# Patient Record
Sex: Male | Born: 1950 | Race: White | Hispanic: No | Smoking: Never smoker
Health system: Southern US, Community
[De-identification: ages and names within clinical notes are randomized; demographics above are authoritative.]

## PROBLEM LIST (undated history)

## (undated) DIAGNOSIS — T8859XA Other complications of anesthesia, initial encounter: Secondary | ICD-10-CM

## (undated) DIAGNOSIS — G4733 Obstructive sleep apnea (adult) (pediatric): Secondary | ICD-10-CM

## (undated) DIAGNOSIS — I10 Essential (primary) hypertension: Secondary | ICD-10-CM

## (undated) DIAGNOSIS — F411 Generalized anxiety disorder: Secondary | ICD-10-CM

## (undated) DIAGNOSIS — Z8614 Personal history of Methicillin resistant Staphylococcus aureus infection: Secondary | ICD-10-CM

## (undated) DIAGNOSIS — J45909 Unspecified asthma, uncomplicated: Secondary | ICD-10-CM

## (undated) DIAGNOSIS — M1990 Unspecified osteoarthritis, unspecified site: Secondary | ICD-10-CM

## (undated) DIAGNOSIS — N529 Male erectile dysfunction, unspecified: Secondary | ICD-10-CM

## (undated) DIAGNOSIS — F341 Dysthymic disorder: Secondary | ICD-10-CM

## (undated) DIAGNOSIS — K602 Anal fissure, unspecified: Secondary | ICD-10-CM

## (undated) DIAGNOSIS — R112 Nausea with vomiting, unspecified: Secondary | ICD-10-CM

## (undated) DIAGNOSIS — E785 Hyperlipidemia, unspecified: Secondary | ICD-10-CM

## (undated) DIAGNOSIS — T4145XA Adverse effect of unspecified anesthetic, initial encounter: Secondary | ICD-10-CM

## (undated) DIAGNOSIS — I499 Cardiac arrhythmia, unspecified: Secondary | ICD-10-CM

## (undated) DIAGNOSIS — K219 Gastro-esophageal reflux disease without esophagitis: Secondary | ICD-10-CM

## (undated) DIAGNOSIS — Z9889 Other specified postprocedural states: Secondary | ICD-10-CM

## (undated) DIAGNOSIS — G8929 Other chronic pain: Secondary | ICD-10-CM

## (undated) DIAGNOSIS — IMO0002 Reserved for concepts with insufficient information to code with codable children: Secondary | ICD-10-CM

## (undated) DIAGNOSIS — E119 Type 2 diabetes mellitus without complications: Secondary | ICD-10-CM

## (undated) DIAGNOSIS — E349 Endocrine disorder, unspecified: Secondary | ICD-10-CM

## (undated) HISTORY — DX: Dysthymic disorder: F34.1

## (undated) HISTORY — DX: Obstructive sleep apnea (adult) (pediatric): G47.33

## (undated) HISTORY — DX: Gastro-esophageal reflux disease without esophagitis: K21.9

## (undated) HISTORY — DX: Reserved for concepts with insufficient information to code with codable children: IMO0002

## (undated) HISTORY — DX: Generalized anxiety disorder: F41.1

## (undated) HISTORY — DX: Essential (primary) hypertension: I10

## (undated) HISTORY — DX: Hyperlipidemia, unspecified: E78.5

## (undated) HISTORY — PX: HERNIA REPAIR: SHX51

## (undated) HISTORY — DX: Type 2 diabetes mellitus without complications: E11.9

## (undated) HISTORY — PX: SHOULDER ARTHROSCOPY: SHX128

## (undated) HISTORY — DX: Morbid (severe) obesity due to excess calories: E66.01

## (undated) HISTORY — DX: Anal fissure, unspecified: K60.2

## (undated) HISTORY — PX: SPHINCTEROTOMY: SHX5279

## (undated) HISTORY — DX: Unspecified osteoarthritis, unspecified site: M19.90

## (undated) HISTORY — DX: Unspecified asthma, uncomplicated: J45.909

## (undated) HISTORY — DX: Endocrine disorder, unspecified: E34.9

## (undated) HISTORY — DX: Male erectile dysfunction, unspecified: N52.9

## (undated) HISTORY — DX: Personal history of Methicillin resistant Staphylococcus aureus infection: Z86.14

## (undated) HISTORY — PX: OTHER SURGICAL HISTORY: SHX169

---

## 2000-08-28 ENCOUNTER — Observation Stay (HOSPITAL_COMMUNITY): Admission: RE | Admit: 2000-08-28 | Discharge: 2000-08-29 | Payer: Self-pay | Admitting: Orthopedic Surgery

## 2001-12-28 ENCOUNTER — Ambulatory Visit (HOSPITAL_COMMUNITY): Admission: RE | Admit: 2001-12-28 | Discharge: 2001-12-28 | Payer: Self-pay | Admitting: Gastroenterology

## 2003-05-01 ENCOUNTER — Emergency Department (HOSPITAL_COMMUNITY): Admission: EM | Admit: 2003-05-01 | Discharge: 2003-05-02 | Payer: Self-pay | Admitting: Emergency Medicine

## 2004-06-28 ENCOUNTER — Ambulatory Visit: Payer: Self-pay | Admitting: Pulmonary Disease

## 2004-06-28 ENCOUNTER — Ambulatory Visit (HOSPITAL_COMMUNITY): Admission: RE | Admit: 2004-06-28 | Discharge: 2004-06-28 | Payer: Self-pay | Admitting: Pulmonary Disease

## 2005-06-03 ENCOUNTER — Ambulatory Visit: Payer: Self-pay | Admitting: Pulmonary Disease

## 2005-12-19 ENCOUNTER — Ambulatory Visit: Payer: Self-pay | Admitting: Pulmonary Disease

## 2006-06-29 ENCOUNTER — Ambulatory Visit: Payer: Self-pay | Admitting: Pulmonary Disease

## 2006-06-29 LAB — CONVERTED CEMR LAB
ALT: 23 units/L (ref 0–40)
AST: 21 units/L (ref 0–37)
Albumin: 4.3 g/dL (ref 3.5–5.2)
Alkaline Phosphatase: 81 units/L (ref 39–117)
BUN: 16 mg/dL (ref 6–23)
Basophils Absolute: 0 10*3/uL (ref 0.0–0.1)
Basophils Relative: 0.1 % (ref 0.0–1.0)
CO2: 30 meq/L (ref 19–32)
Calcium: 9.9 mg/dL (ref 8.4–10.5)
Chloride: 104 meq/L (ref 96–112)
Cholesterol: 195 mg/dL (ref 0–200)
Creatinine, Ser: 1 mg/dL (ref 0.4–1.5)
Eosinophils Absolute: 0.4 10*3/uL (ref 0.0–0.6)
Eosinophils Relative: 3.3 % (ref 0.0–5.0)
GFR calc Af Amer: 100 mL/min
GFR calc non Af Amer: 83 mL/min
Glucose, Bld: 106 mg/dL — ABNORMAL HIGH (ref 70–99)
HCT: 44.5 % (ref 39.0–52.0)
HDL: 40.7 mg/dL (ref >39.0)
Hemoglobin: 15.2 g/dL (ref 13.0–17.0)
LDL Cholesterol: 131 mg/dL — ABNORMAL HIGH (ref 0–99)
Lymphocytes Relative: 20.2 % (ref 12.0–46.0)
MCHC: 34.1 g/dL (ref 30.0–36.0)
MCV: 85.3 fL (ref 78.0–100.0)
Monocytes Absolute: 0.5 10*3/uL (ref 0.2–0.7)
Monocytes Relative: 4.8 % (ref 3.0–11.0)
Neutro Abs: 7.6 10*3/uL (ref 1.4–7.7)
Neutrophils Relative %: 71.6 % (ref 43.0–77.0)
PSA: 0.58 ng/mL (ref 0.10–4.00)
Platelets: 355 10*3/uL (ref 150–400)
Potassium: 4.2 meq/L (ref 3.5–5.1)
RBC: 5.22 M/uL (ref 4.22–5.81)
RDW: 12 % (ref 11.5–14.6)
Sodium: 139 meq/L (ref 135–145)
TSH: 1.3 microintl units/mL (ref 0.35–5.50)
Total Bilirubin: 1.1 mg/dL (ref 0.3–1.2)
Total CHOL/HDL Ratio: 4.8
Total Protein: 7.7 g/dL (ref 6.0–8.3)
Triglycerides: 119 mg/dL (ref 0–149)
VLDL: 24 mg/dL (ref 0–40)
WBC: 10.7 10*3/uL — ABNORMAL HIGH (ref 4.5–10.5)

## 2006-07-22 ENCOUNTER — Emergency Department (HOSPITAL_COMMUNITY): Admission: EM | Admit: 2006-07-22 | Discharge: 2006-07-22 | Payer: Self-pay | Admitting: Emergency Medicine

## 2006-07-23 ENCOUNTER — Emergency Department (HOSPITAL_COMMUNITY): Admission: EM | Admit: 2006-07-23 | Discharge: 2006-07-23 | Payer: Self-pay | Admitting: Emergency Medicine

## 2006-07-24 ENCOUNTER — Encounter (HOSPITAL_COMMUNITY): Admission: RE | Admit: 2006-07-24 | Discharge: 2006-10-22 | Payer: Self-pay | Admitting: Otolaryngology

## 2006-07-30 ENCOUNTER — Emergency Department (HOSPITAL_COMMUNITY): Admission: EM | Admit: 2006-07-30 | Discharge: 2006-07-30 | Payer: Self-pay | Admitting: *Deleted

## 2006-08-30 ENCOUNTER — Encounter: Payer: Self-pay | Admitting: Cardiovascular Disease

## 2007-04-16 ENCOUNTER — Ambulatory Visit: Payer: Self-pay | Admitting: Pulmonary Disease

## 2007-04-16 LAB — CONVERTED CEMR LAB: Streptococcus, Group A Screen (Direct): NEGATIVE

## 2007-07-12 DIAGNOSIS — M1710 Unilateral primary osteoarthritis, unspecified knee: Secondary | ICD-10-CM

## 2007-07-12 DIAGNOSIS — K219 Gastro-esophageal reflux disease without esophagitis: Secondary | ICD-10-CM

## 2007-07-12 DIAGNOSIS — J45909 Unspecified asthma, uncomplicated: Secondary | ICD-10-CM | POA: Insufficient documentation

## 2007-07-13 ENCOUNTER — Encounter: Payer: Self-pay | Admitting: Pulmonary Disease

## 2007-07-13 ENCOUNTER — Ambulatory Visit: Payer: Self-pay | Admitting: Pulmonary Disease

## 2007-07-13 DIAGNOSIS — E66812 Obesity, class 2: Secondary | ICD-10-CM | POA: Insufficient documentation

## 2007-07-13 DIAGNOSIS — F341 Dysthymic disorder: Secondary | ICD-10-CM | POA: Insufficient documentation

## 2007-07-13 DIAGNOSIS — E785 Hyperlipidemia, unspecified: Secondary | ICD-10-CM | POA: Insufficient documentation

## 2007-07-13 DIAGNOSIS — F528 Other sexual dysfunction not due to a substance or known physiological condition: Secondary | ICD-10-CM | POA: Insufficient documentation

## 2007-07-13 DIAGNOSIS — I10 Essential (primary) hypertension: Secondary | ICD-10-CM | POA: Insufficient documentation

## 2007-07-13 DIAGNOSIS — M48061 Spinal stenosis, lumbar region without neurogenic claudication: Secondary | ICD-10-CM

## 2007-07-22 LAB — CONVERTED CEMR LAB
ALT: 28 units/L (ref 0–53)
AST: 25 units/L (ref 0–37)
Albumin: 4.1 g/dL (ref 3.5–5.2)
Alkaline Phosphatase: 76 units/L (ref 39–117)
BUN: 15 mg/dL (ref 6–23)
Basophils Absolute: 0 10*3/uL (ref 0.0–0.1)
Basophils Relative: 0.6 % (ref 0.0–1.0)
Bilirubin, Direct: 0.2 mg/dL (ref 0.0–0.3)
CO2: 29 meq/L (ref 19–32)
Calcium: 9.9 mg/dL (ref 8.4–10.5)
Chloride: 101 meq/L (ref 96–112)
Cholesterol: 214 mg/dL (ref 0–200)
Creatinine, Ser: 0.9 mg/dL (ref 0.4–1.5)
Direct LDL: 126.7 mg/dL
Eosinophils Absolute: 0.3 10*3/uL (ref 0.0–0.6)
Eosinophils Relative: 4.1 % (ref 0.0–5.0)
GFR calc Af Amer: 113 mL/min
GFR calc non Af Amer: 93 mL/min
Glucose, Bld: 107 mg/dL — ABNORMAL HIGH (ref 70–99)
HCT: 41.3 % (ref 39.0–52.0)
HDL: 39.3 mg/dL (ref >39.0)
Hemoglobin: 14.1 g/dL (ref 13.0–17.0)
Lymphocytes Relative: 25.4 % (ref 12.0–46.0)
MCHC: 34.2 g/dL (ref 30.0–36.0)
MCV: 84.8 fL (ref 78.0–100.0)
Monocytes Absolute: 0.6 10*3/uL (ref 0.2–0.7)
Monocytes Relative: 7.8 % (ref 3.0–11.0)
Neutro Abs: 5.2 10*3/uL (ref 1.4–7.7)
Neutrophils Relative %: 62.1 % (ref 43.0–77.0)
PSA: 0.84 ng/mL (ref 0.10–4.00)
Platelets: 312 10*3/uL (ref 150–400)
Potassium: 4.2 meq/L (ref 3.5–5.1)
RBC: 4.87 M/uL (ref 4.22–5.81)
RDW: 12.4 % (ref 11.5–14.6)
Sodium: 139 meq/L (ref 135–145)
TSH: 2.88 microintl units/mL (ref 0.35–5.50)
Testosterone: 329.04 ng/dL — ABNORMAL LOW (ref 350.00–890)
Total Bilirubin: 1 mg/dL (ref 0.3–1.2)
Total CHOL/HDL Ratio: 5.4
Total Protein: 7.6 g/dL (ref 6.0–8.3)
Triglycerides: 214 mg/dL (ref 0–149)
VLDL: 43 mg/dL — ABNORMAL HIGH (ref 0–40)
WBC: 8.2 10*3/uL (ref 4.5–10.5)

## 2007-09-19 ENCOUNTER — Telehealth: Payer: Self-pay | Admitting: Pulmonary Disease

## 2007-10-02 DIAGNOSIS — E349 Endocrine disorder, unspecified: Secondary | ICD-10-CM

## 2007-10-17 ENCOUNTER — Telehealth (INDEPENDENT_AMBULATORY_CARE_PROVIDER_SITE_OTHER): Payer: Self-pay | Admitting: *Deleted

## 2007-10-25 ENCOUNTER — Ambulatory Visit: Payer: Self-pay | Admitting: Pulmonary Disease

## 2007-10-26 ENCOUNTER — Ambulatory Visit: Payer: Self-pay | Admitting: Pulmonary Disease

## 2007-10-30 ENCOUNTER — Ambulatory Visit: Payer: Self-pay | Admitting: Pulmonary Disease

## 2007-10-30 LAB — CONVERTED CEMR LAB
ALT: 29 units/L (ref 0–53)
AST: 30 units/L (ref 0–37)
Albumin: 3.9 g/dL (ref 3.5–5.2)
Alkaline Phosphatase: 73 units/L (ref 39–117)
BUN: 16 mg/dL (ref 6–23)
Bilirubin, Direct: 0.1 mg/dL (ref 0.0–0.3)
CO2: 28 meq/L (ref 19–32)
Calcium: 9.3 mg/dL (ref 8.4–10.5)
Chloride: 107 meq/L (ref 96–112)
Cholesterol: 115 mg/dL (ref 0–200)
Creatinine, Ser: 1 mg/dL (ref 0.4–1.5)
GFR calc Af Amer: 99 mL/min
GFR calc non Af Amer: 82 mL/min
Glucose, Bld: 125 mg/dL — ABNORMAL HIGH (ref 70–99)
HDL: 29.7 mg/dL — ABNORMAL LOW (ref >39.0)
LDL Cholesterol: 66 mg/dL (ref 0–99)
PSA: 0.69 ng/mL (ref 0.10–4.00)
Potassium: 3.7 meq/L (ref 3.5–5.1)
Sodium: 136 meq/L (ref 135–145)
Testosterone: 190.75 ng/dL — ABNORMAL LOW (ref 350.00–890)
Total Bilirubin: 0.5 mg/dL (ref 0.3–1.2)
Total CHOL/HDL Ratio: 3.9
Total Protein: 6.9 g/dL (ref 6.0–8.3)
Triglycerides: 98 mg/dL (ref 0–149)
VLDL: 20 mg/dL (ref 0–40)

## 2007-10-31 ENCOUNTER — Telehealth: Payer: Self-pay | Admitting: Pulmonary Disease

## 2007-11-01 ENCOUNTER — Telehealth: Payer: Self-pay | Admitting: Pulmonary Disease

## 2007-11-21 ENCOUNTER — Telehealth: Payer: Self-pay | Admitting: Pulmonary Disease

## 2007-11-30 ENCOUNTER — Telehealth: Payer: Self-pay | Admitting: Pulmonary Disease

## 2007-12-20 ENCOUNTER — Encounter: Payer: Self-pay | Admitting: Pulmonary Disease

## 2007-12-21 ENCOUNTER — Encounter: Payer: Self-pay | Admitting: Pulmonary Disease

## 2007-12-31 ENCOUNTER — Telehealth (INDEPENDENT_AMBULATORY_CARE_PROVIDER_SITE_OTHER): Payer: Self-pay | Admitting: *Deleted

## 2008-01-03 ENCOUNTER — Telehealth (INDEPENDENT_AMBULATORY_CARE_PROVIDER_SITE_OTHER): Payer: Self-pay | Admitting: *Deleted

## 2008-01-08 ENCOUNTER — Telehealth: Payer: Self-pay | Admitting: Pulmonary Disease

## 2008-01-29 ENCOUNTER — Encounter: Payer: Self-pay | Admitting: Pulmonary Disease

## 2008-02-29 ENCOUNTER — Telehealth (INDEPENDENT_AMBULATORY_CARE_PROVIDER_SITE_OTHER): Payer: Self-pay | Admitting: *Deleted

## 2008-03-21 ENCOUNTER — Telehealth: Payer: Self-pay | Admitting: Pulmonary Disease

## 2008-03-27 ENCOUNTER — Encounter: Payer: Self-pay | Admitting: Pulmonary Disease

## 2008-05-14 ENCOUNTER — Ambulatory Visit: Payer: Self-pay | Admitting: Pulmonary Disease

## 2008-05-14 DIAGNOSIS — K602 Anal fissure, unspecified: Secondary | ICD-10-CM | POA: Insufficient documentation

## 2008-05-18 LAB — CONVERTED CEMR LAB
ALT: 27 units/L (ref 0–53)
AST: 26 units/L (ref 0–37)
Albumin: 4.2 g/dL (ref 3.5–5.2)
Alkaline Phosphatase: 82 units/L (ref 39–117)
BUN: 19 mg/dL (ref 6–23)
Basophils Absolute: 0 10*3/uL (ref 0.0–0.1)
Basophils Relative: 0.3 % (ref 0.0–3.0)
Bilirubin, Direct: 0.1 mg/dL (ref 0.0–0.3)
CO2: 30 meq/L (ref 19–32)
Calcium: 9.3 mg/dL (ref 8.4–10.5)
Chloride: 105 meq/L (ref 96–112)
Creatinine, Ser: 1 mg/dL (ref 0.4–1.5)
Eosinophils Absolute: 0.4 10*3/uL (ref 0.0–0.7)
Eosinophils Relative: 4.3 % (ref 0.0–5.0)
GFR calc Af Amer: 99 mL/min
GFR calc non Af Amer: 82 mL/min
Glucose, Bld: 94 mg/dL (ref 70–99)
HCT: 41.8 % (ref 39.0–52.0)
Hemoglobin: 14.5 g/dL (ref 13.0–17.0)
Lymphocytes Relative: 25 % (ref 12.0–46.0)
MCHC: 34.6 g/dL (ref 30.0–36.0)
MCV: 85.7 fL (ref 78.0–100.0)
Monocytes Absolute: 0.8 10*3/uL (ref 0.1–1.0)
Monocytes Relative: 8.4 % (ref 3.0–12.0)
Neutro Abs: 5.6 10*3/uL (ref 1.4–7.7)
Neutrophils Relative %: 62 % (ref 43.0–77.0)
Platelets: 279 10*3/uL (ref 150–400)
Potassium: 3.9 meq/L (ref 3.5–5.1)
RBC: 4.88 M/uL (ref 4.22–5.81)
RDW: 12.4 % (ref 11.5–14.6)
Sodium: 141 meq/L (ref 135–145)
TSH: 2.37 microintl units/mL (ref 0.35–5.50)
Total Bilirubin: 0.8 mg/dL (ref 0.3–1.2)
Total Protein: 7.6 g/dL (ref 6.0–8.3)
WBC: 9.1 10*3/uL (ref 4.5–10.5)

## 2008-05-26 ENCOUNTER — Telehealth: Payer: Self-pay | Admitting: Pulmonary Disease

## 2008-05-29 ENCOUNTER — Ambulatory Visit: Payer: Self-pay | Admitting: Pulmonary Disease

## 2008-06-25 ENCOUNTER — Encounter: Payer: Self-pay | Admitting: Pulmonary Disease

## 2008-07-15 ENCOUNTER — Ambulatory Visit: Payer: Self-pay | Admitting: Pulmonary Disease

## 2008-07-15 ENCOUNTER — Encounter: Payer: Self-pay | Admitting: Adult Health

## 2008-07-15 LAB — CONVERTED CEMR LAB: Anti Nuclear Antibody(ANA): NEGATIVE

## 2008-07-16 ENCOUNTER — Telehealth (INDEPENDENT_AMBULATORY_CARE_PROVIDER_SITE_OTHER): Payer: Self-pay | Admitting: *Deleted

## 2008-07-17 ENCOUNTER — Telehealth (INDEPENDENT_AMBULATORY_CARE_PROVIDER_SITE_OTHER): Payer: Self-pay | Admitting: *Deleted

## 2008-07-17 LAB — CONVERTED CEMR LAB
BUN: 23 mg/dL (ref 6–23)
Bacteria, UA: NEGATIVE
Basophils Absolute: 0 10*3/uL (ref 0.0–0.1)
Basophils Relative: 0.5 % (ref 0.0–3.0)
CO2: 27 meq/L (ref 19–32)
Calcium: 9.6 mg/dL (ref 8.4–10.5)
Chloride: 101 meq/L (ref 96–112)
Creatinine, Ser: 1.3 mg/dL (ref 0.4–1.5)
Crystals: NEGATIVE
Eosinophils Absolute: 0.3 10*3/uL (ref 0.0–0.7)
Eosinophils Relative: 2.6 % (ref 0.0–5.0)
GFR calc Af Amer: 73 mL/min
GFR calc non Af Amer: 61 mL/min
Glucose, Bld: 118 mg/dL — ABNORMAL HIGH (ref 70–99)
HCT: 43.7 % (ref 39.0–52.0)
Hemoglobin, Urine: NEGATIVE
Hemoglobin: 15.4 g/dL (ref 13.0–17.0)
Hgb A1c MFr Bld: 5.8 % (ref 4.6–6.0)
Leukocytes, UA: NEGATIVE
Lymphocytes Relative: 18.8 % (ref 12.0–46.0)
MCHC: 35.2 g/dL (ref 30.0–36.0)
MCV: 85.2 fL (ref 78.0–100.0)
Monocytes Absolute: 0.6 10*3/uL (ref 0.1–1.0)
Monocytes Relative: 6.7 % (ref 3.0–12.0)
Neutro Abs: 7 10*3/uL (ref 1.4–7.7)
Neutrophils Relative %: 71.4 % (ref 43.0–77.0)
Nitrite: NEGATIVE
Platelets: 319 10*3/uL (ref 150–400)
Potassium: 4.2 meq/L (ref 3.5–5.1)
RBC / HPF: NONE SEEN
RBC: 5.13 M/uL (ref 4.22–5.81)
RDW: 12.4 % (ref 11.5–14.6)
Sodium: 136 meq/L (ref 135–145)
Specific Gravity, Urine: >=1.03 (ref 1.000–1.03)
Squamous Epithelial / HPF: NEGATIVE /lpf
Total Protein, Urine: 30 mg/dL — AB
Urine Glucose: NEGATIVE mg/dL
Urobilinogen, UA: 0.2 (ref 0.0–1.0)
WBC: 9.7 10*3/uL (ref 4.5–10.5)
pH: 5 (ref 5.0–8.0)

## 2008-07-30 ENCOUNTER — Ambulatory Visit (HOSPITAL_BASED_OUTPATIENT_CLINIC_OR_DEPARTMENT_OTHER): Admission: RE | Admit: 2008-07-30 | Discharge: 2008-07-30 | Payer: Self-pay | Admitting: General Surgery

## 2008-08-19 ENCOUNTER — Telehealth (INDEPENDENT_AMBULATORY_CARE_PROVIDER_SITE_OTHER): Payer: Self-pay | Admitting: *Deleted

## 2008-09-10 ENCOUNTER — Telehealth: Payer: Self-pay | Admitting: Pulmonary Disease

## 2008-09-24 ENCOUNTER — Encounter: Payer: Self-pay | Admitting: Internal Medicine

## 2008-09-24 ENCOUNTER — Ambulatory Visit (HOSPITAL_BASED_OUTPATIENT_CLINIC_OR_DEPARTMENT_OTHER): Admission: RE | Admit: 2008-09-24 | Discharge: 2008-09-24 | Payer: Self-pay | Admitting: Cardiovascular Disease

## 2008-09-27 ENCOUNTER — Ambulatory Visit: Payer: Self-pay | Admitting: Internal Medicine

## 2008-10-06 ENCOUNTER — Ambulatory Visit: Payer: Self-pay | Admitting: Pulmonary Disease

## 2008-10-06 DIAGNOSIS — Z9989 Dependence on other enabling machines and devices: Secondary | ICD-10-CM

## 2008-10-06 DIAGNOSIS — G4733 Obstructive sleep apnea (adult) (pediatric): Secondary | ICD-10-CM | POA: Insufficient documentation

## 2008-10-07 LAB — CONVERTED CEMR LAB
ALT: 22 units/L (ref 0–53)
AST: 19 units/L (ref 0–37)
Albumin: 4.4 g/dL (ref 3.5–5.2)
Alkaline Phosphatase: 68 units/L (ref 39–117)
BUN: 23 mg/dL (ref 6–23)
Basophils Absolute: 0 10*3/uL (ref 0.0–0.1)
Basophils Relative: 0.3 % (ref 0.0–3.0)
Bilirubin, Direct: 0.1 mg/dL (ref 0.0–0.3)
CO2: 28 meq/L (ref 19–32)
Calcium: 9.5 mg/dL (ref 8.4–10.5)
Chloride: 104 meq/L (ref 96–112)
Cholesterol: 148 mg/dL (ref 0–200)
Creatinine, Ser: 1 mg/dL (ref 0.4–1.5)
Eosinophils Absolute: 0.3 10*3/uL (ref 0.0–0.7)
Eosinophils Relative: 2.8 % (ref 0.0–5.0)
GFR calc non Af Amer: 81.84 mL/min (ref >60)
Glucose, Bld: 94 mg/dL (ref 70–99)
HCT: 40.3 % (ref 39.0–52.0)
HDL: 47.5 mg/dL (ref >39.00)
Hemoglobin: 14 g/dL (ref 13.0–17.0)
LDL Cholesterol: 84 mg/dL (ref 0–99)
Lymphocytes Relative: 22.2 % (ref 12.0–46.0)
Lymphs Abs: 2.3 10*3/uL (ref 0.7–4.0)
MCHC: 34.7 g/dL (ref 30.0–36.0)
MCV: 86.7 fL (ref 78.0–100.0)
Monocytes Absolute: 0.7 10*3/uL (ref 0.1–1.0)
Monocytes Relative: 6.9 % (ref 3.0–12.0)
Neutro Abs: 6.9 10*3/uL (ref 1.4–7.7)
Neutrophils Relative %: 67.8 % (ref 43.0–77.0)
PSA: 0.85 ng/mL (ref 0.10–4.00)
Platelets: 255 10*3/uL (ref 150.0–400.0)
Potassium: 4 meq/L (ref 3.5–5.1)
RBC: 4.65 M/uL (ref 4.22–5.81)
RDW: 12.3 % (ref 11.5–14.6)
Sodium: 139 meq/L (ref 135–145)
TSH: 2.59 microintl units/mL (ref 0.35–5.50)
Total Bilirubin: 0.7 mg/dL (ref 0.3–1.2)
Total CHOL/HDL Ratio: 3
Total Protein: 7.6 g/dL (ref 6.0–8.3)
Triglycerides: 81 mg/dL (ref 0.0–149.0)
VLDL: 16.2 mg/dL (ref 0.0–40.0)
WBC: 10.2 10*3/uL (ref 4.5–10.5)

## 2008-10-13 ENCOUNTER — Telehealth (INDEPENDENT_AMBULATORY_CARE_PROVIDER_SITE_OTHER): Payer: Self-pay | Admitting: *Deleted

## 2008-12-23 ENCOUNTER — Telehealth: Payer: Self-pay | Admitting: Pulmonary Disease

## 2008-12-25 ENCOUNTER — Ambulatory Visit: Payer: Self-pay | Admitting: Pulmonary Disease

## 2008-12-30 ENCOUNTER — Telehealth: Payer: Self-pay | Admitting: Pulmonary Disease

## 2008-12-30 LAB — CONVERTED CEMR LAB

## 2009-02-06 ENCOUNTER — Encounter: Payer: Self-pay | Admitting: Pulmonary Disease

## 2009-02-17 ENCOUNTER — Telehealth: Payer: Self-pay | Admitting: Pulmonary Disease

## 2009-03-16 ENCOUNTER — Encounter: Payer: Self-pay | Admitting: Pulmonary Disease

## 2009-04-01 ENCOUNTER — Telehealth: Payer: Self-pay | Admitting: Pulmonary Disease

## 2009-04-06 ENCOUNTER — Ambulatory Visit: Payer: Self-pay | Admitting: Pulmonary Disease

## 2009-04-06 DIAGNOSIS — L989 Disorder of the skin and subcutaneous tissue, unspecified: Secondary | ICD-10-CM | POA: Insufficient documentation

## 2009-04-10 LAB — CONVERTED CEMR LAB
BUN: 18 mg/dL (ref 6–23)
CO2: 30 meq/L (ref 19–32)
Calcium: 8.9 mg/dL (ref 8.4–10.5)
Chloride: 107 meq/L (ref 96–112)
Cholesterol: 135 mg/dL (ref 0–200)
Creatinine, Ser: 1 mg/dL (ref 0.4–1.5)
GFR calc non Af Amer: 81.7 mL/min (ref >60)
Glucose, Bld: 108 mg/dL — ABNORMAL HIGH (ref 70–99)
HDL: 35.8 mg/dL — ABNORMAL LOW (ref >39.00)
LDL Cholesterol: 70 mg/dL (ref 0–99)
Potassium: 4.1 meq/L (ref 3.5–5.1)
Sodium: 143 meq/L (ref 135–145)
Total CHOL/HDL Ratio: 4
Triglycerides: 145 mg/dL (ref 0.0–149.0)
VLDL: 29 mg/dL (ref 0.0–40.0)

## 2009-04-13 ENCOUNTER — Encounter: Payer: Self-pay | Admitting: Pulmonary Disease

## 2009-04-14 ENCOUNTER — Telehealth: Payer: Self-pay | Admitting: Pulmonary Disease

## 2009-05-18 ENCOUNTER — Telehealth: Payer: Self-pay | Admitting: Pulmonary Disease

## 2009-06-09 ENCOUNTER — Telehealth: Payer: Self-pay | Admitting: Pulmonary Disease

## 2009-07-13 ENCOUNTER — Encounter: Payer: Self-pay | Admitting: Pulmonary Disease

## 2009-07-21 ENCOUNTER — Telehealth: Payer: Self-pay | Admitting: Pulmonary Disease

## 2009-08-17 ENCOUNTER — Encounter: Payer: Self-pay | Admitting: Cardiovascular Disease

## 2009-09-07 ENCOUNTER — Ambulatory Visit: Payer: Self-pay | Admitting: Cardiovascular Disease

## 2009-09-07 ENCOUNTER — Encounter: Payer: Self-pay | Admitting: Pulmonary Disease

## 2009-10-05 ENCOUNTER — Ambulatory Visit: Payer: Self-pay | Admitting: Pulmonary Disease

## 2009-10-06 LAB — CONVERTED CEMR LAB
ALT: 27 units/L (ref 0–53)
AST: 27 units/L (ref 0–37)
Albumin: 4.3 g/dL (ref 3.5–5.2)
Alkaline Phosphatase: 61 units/L (ref 39–117)
BUN: 19 mg/dL (ref 6–23)
Basophils Absolute: 0 10*3/uL (ref 0.0–0.1)
Basophils Relative: 0.3 % (ref 0.0–3.0)
Bilirubin, Direct: 0.1 mg/dL (ref 0.0–0.3)
CO2: 32 meq/L (ref 19–32)
Calcium: 9.4 mg/dL (ref 8.4–10.5)
Chloride: 103 meq/L (ref 96–112)
Cholesterol: 145 mg/dL (ref 0–200)
Creatinine, Ser: 1 mg/dL (ref 0.4–1.5)
Eosinophils Absolute: 0.3 10*3/uL (ref 0.0–0.7)
Eosinophils Relative: 4.3 % (ref 0.0–5.0)
GFR calc non Af Amer: 81.56 mL/min (ref >60)
Glucose, Bld: 103 mg/dL — ABNORMAL HIGH (ref 70–99)
HCT: 40.3 % (ref 39.0–52.0)
HDL: 42.6 mg/dL (ref >39.00)
Hemoglobin: 14.1 g/dL (ref 13.0–17.0)
LDL Cholesterol: 77 mg/dL (ref 0–99)
Lymphocytes Relative: 23.8 % (ref 12.0–46.0)
Lymphs Abs: 1.9 10*3/uL (ref 0.7–4.0)
MCHC: 35 g/dL (ref 30.0–36.0)
MCV: 84.3 fL (ref 78.0–100.0)
Monocytes Absolute: 0.6 10*3/uL (ref 0.1–1.0)
Monocytes Relative: 7.2 % (ref 3.0–12.0)
Neutro Abs: 5.2 10*3/uL (ref 1.4–7.7)
Neutrophils Relative %: 64.4 % (ref 43.0–77.0)
PSA: 0.65 ng/mL (ref 0.10–4.00)
Platelets: 271 10*3/uL (ref 150.0–400.0)
Potassium: 4.7 meq/L (ref 3.5–5.1)
RBC: 4.78 M/uL (ref 4.22–5.81)
RDW: 13.5 % (ref 11.5–14.6)
Sodium: 141 meq/L (ref 135–145)
TSH: 1.9 microintl units/mL (ref 0.35–5.50)
Total Bilirubin: 0.5 mg/dL (ref 0.3–1.2)
Total CHOL/HDL Ratio: 3
Total Protein: 7.3 g/dL (ref 6.0–8.3)
Triglycerides: 128 mg/dL (ref 0.0–149.0)
VLDL: 25.6 mg/dL (ref 0.0–40.0)
WBC: 8 10*3/uL (ref 4.5–10.5)

## 2009-11-04 ENCOUNTER — Encounter: Payer: Self-pay | Admitting: Pulmonary Disease

## 2009-11-23 ENCOUNTER — Ambulatory Visit: Payer: Self-pay | Admitting: Pulmonary Disease

## 2010-01-04 ENCOUNTER — Encounter: Payer: Self-pay | Admitting: Pulmonary Disease

## 2010-04-05 ENCOUNTER — Ambulatory Visit: Payer: Self-pay | Admitting: Cardiovascular Disease

## 2010-04-05 ENCOUNTER — Ambulatory Visit: Payer: Self-pay | Admitting: Pulmonary Disease

## 2010-04-05 LAB — CONVERTED CEMR LAB
BUN: 15 mg/dL
CO2: 29 meq/L
Calcium: 8.8 mg/dL
Chloride: 98 meq/L
Cholesterol: 152 mg/dL
Creatinine, Ser: 0.9 mg/dL
GFR calc non Af Amer: 88.53 mL/min
Glucose, Bld: 86 mg/dL
HDL: 40.5 mg/dL
LDL Cholesterol: 72 mg/dL
Potassium: 4.1 meq/L
Sodium: 141 meq/L
Total CHOL/HDL Ratio: 4
Triglycerides: 198 mg/dL — ABNORMAL HIGH
VLDL: 39.6 mg/dL

## 2010-07-06 NOTE — Letter (Signed)
[facility]   Imported By: Sherian Rein 11/23/2009 11:48:01  _____________________________________________________________________  External Attachment:    Type:   Image     Comment:   External Document

## 2010-07-06 NOTE — Letter (Signed)
Summary: [facility]   Imported By: Sherian Rein 09/15/2009 07:33:09  _____________________________________________________________________  External Attachment:    Type:   Image     Comment:   External Document

## 2010-07-06 NOTE — Letter (Signed)
Summary: [facility]   Imported By: Sherian Rein 01/11/2010 14:36:48  _____________________________________________________________________  External Attachment:    Type:   Image     Comment:   External Document

## 2010-07-06 NOTE — Assessment & Plan Note (Signed)
Summary: F6M/AMD  Medications Added * FISH OIL 1400 MG take one capsule by mouth two times a day      Allergies Added: NKDA  Visit Type:  Follow-up Referring Provider:  Mariah Milling Primary Provider:  Dr. Michele Mcalpine  CC:  Denies chest pain or shortness of breath..  History of Present Illness: Dakota Ellison is a pleasant 60 year old gentleman with a history of obesity, obstructive sleep apnea, hypertension, hyperlipidemia who presents 4 routine followup.  He continues to struggle with his weight. His weight is up more than 11 pounds since his last office visit. He has an addiction to junk food he reports. He stopped doing any significant exercise. He knows that he needs to lose weight but he has not been particularly motivated.  He denies any other symptoms of shortness of breath or chest discomfort.  He is able to tolerate his CPAP.  cholesterol in November 2010 is 135, LDL 70, HDL 36.  Last echocardiogram in March 2008 showing normal systolic function, right ventricle mildly dilated, mild mitral regurg, mild tricuspid regurg.  Last stress test in March 2008 where he achieved 10 mets, exercised for 9 minutes, no ischemia noted. Overall a normal,  low-risk scan.  EKG shows normal sinus rhythm with rate 80 beats per minute, no significant ST-T wave changes  Current Medications (verified): 1)  Aspirin 81 Mg Tbec (Aspirin) .... Take One Tablet By Mouth Daily 2)  Exforge Hct 10-320-25 Mg Tabs (Amlodipine-Valsartan-Hctz) .... Take 1 Tablet By Mouth Once A Day 3)  Simvastatin 40 Mg  Tabs (Simvastatin) .... Take One Tablet By Mouth At Bedtime 4)  Niacin Cr 500 Mg Cr-Tabs (Niacin) .... Take 1 Tab By Mouth Daily.Marland KitchenMarland Kitchen 5)  Fish Oil 1400 Mg .... Take One Capsule By Mouth Two Times A Day 6)  Omeprazole 20 Mg  Cpdr (Omeprazole) .... Take 1 Tablet By Mouth Once A Day 7)  Proctosol Hc 2.5 %  Crea (Hydrocortisone) .... Apply As Directed After Each Bm... 8)  Viagra 100 Mg  Tabs (Sildenafil Citrate) ....  Use As Directed... 9)  Vicodin 5-500 Mg  Tabs (Hydrocodone-Acetaminophen) .... Take One Tablet By Mouth Every 6-8 Hours As Needed   Not To Exceed 3 Per Day 10)  Zoloft 25 Mg Tabs (Sertraline Hcl) .Marland Kitchen.. 1 Once Daily 11)  Alprazolam 0.5 Mg  Tabs (Alprazolam) .Marland Kitchen.. 1 Tab By Mouth Three Times A Day As Needed For Nerves... 12)  Multivitamins   Tabs (Multiple Vitamin) .... Take 1 Tablet By Mouth Once A Day 13)  Chromium Picolinate 200 Mcg Tabs (Chromium Picolinate) .Marland Kitchen.. 1 Tab Daily 14)  Cinnamon 500 Mg Caps (Cinnamon) .Marland Kitchen.. 1 Tab Daily 15)  Potassium Gluconate 550 Mg Tabs (Potassium Gluconate) .Marland Kitchen.. 1 By Mouth Once Daily 16)  Apple Cider Vinegar 500 Mg Tabs (Apple Cider Vinegar) .... 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 Arthroscopic subacromial decompression. Open resection, distal right clavicle  Family History: Last updated: 04-06-10 mother died---pt not sure of age or reason father died at age 13 from  internal complications 1 sibling 1 brother died at age 26 from complications of pneumonia  Social History: Last updated: 04/05/2010 pt is divorced has 1 child pt is a long distance driver smokes an occ. cigar social alcohol  Risk Factors: Alcohol Use: 0  (04/05/2010) Caffeine Use: 2 (04/05/2010) Exercise: yes (04/05/2010)  Risk Factors: Smoking Status: current (04/05/2010) Cigars/wk: 2-3 daily (04/05/2010)  Review of Systems       The patient complains of weight gain.  The patient denies fever, weight loss, vision loss, decreased hearing, hoarseness, chest pain, syncope, dyspnea on exertion, peripheral edema, prolonged cough, abdominal pain, incontinence, muscle weakness, depression, and enlarged lymph nodes.    Vital Signs:  Patient profile:   60 year old male Height:      72 inches Weight:      311 pounds BMI:     42.33 Pulse rate:   80 / minute BP sitting:   112 / 80  (left arm) Cuff size:   large  Vitals Entered By: Bishop Dublin, CMA (April 05, 2010 3:59 PM)  Physical Exam  General:  morbidly obese gentleman in no apparent distress, HEENT exam is benign, oropharynx is clear, neck is supple with no JVP or carotid bruits, heart sounds are regular with S1-S2 and no murmurs appreciated, lungs are clear to auscultation with no wheezes or rales, no significant lower extremity edema, neurologic exam is nonfocal, skin is warm and dry. Pulses are equal and symmetrical in his upper and lower extremities.   Impression & Recommendations:  Problem # 1:  HYPERTENSION (ICD-401.9) blood pressure is well controlled on his current medication regimen. No changes made.  His updated medication list for this problem includes:    Aspirin 81 Mg Tbec (Aspirin) .Marland Kitchen... Take one tablet by mouth daily    Exforge Hct 10-320-25 Mg Tabs (Amlodipine-valsartan-hctz) .Marland Kitchen... Take 1 tablet by mouth once a day  Problem # 2:  HYPERLIPIDEMIA (ICD-272.4) Cholesterol is well controlled on his current medication regimen.Marland Kitchen  His updated medication list for this problem includes:    Simvastatin 40 Mg Tabs (Simvastatin) .Marland Kitchen... Take one tablet by mouth at bedtime    Niacin Cr 500 Mg Cr-tabs (Niacin) .Marland Kitchen... Take 1 tab by mouth daily...  Problem # 3:  OBSTRUCTIVE  SLEEP APNEA (ICD-327.23) He is tolerating his CPAP well with no complications.  Problem # 4:  MORBID OBESITY (ICD-278.01) Obesity is his biggest problem. He is determined to try to lose some weight and we have given him a dietary guide in the office today.  Other Orders: EKG w/ Interpretation (93000)  Patient Instructions: 1)  Your physician recommends that you continue on your current medications as directed. Please refer to the Current Medication list given to you today. 2)  Your physician wants you to follow-up in:   1 year You will receive a reminder letter in the mail two months in advance. If you don't receive a letter, please call our office to schedule the follow-up appointment.

## 2010-07-06 NOTE — Assessment & Plan Note (Signed)
Summary: NP follow up    Copy to:  Gollan Primary Provider/Referring Provider:  Dr. Michele Mcalpine  CC:  red spot on right chest area x2weeks and denies pain/itching.  History of Present Illness: 60 y/o  with known history of multiple medical problems, with HTN, DJD, hyperlipidemia.     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      ** Jan10- saw DrDahlstadt for f/u hypogonadism on Testim gel 5gm/d- he did Testosterone level and PSA but we don't have these results...      ** Feb10- had right knee arthroscopy by DrMurphy... also required more recent tap & shot in knee.      ** Feb10- saw NP w/ weakness, low energy, low interest, etc... tried Zoloft 25mg /d & seems to help.      ** Feb10- had anal fissure surg by DrWeatherly...      ** Apr10- had Sleep Study ordered by DrGollan> pos for OSA w/ AHI= 82, desat to 85%... split night study w/ good control on CPAP 20 (see report)... pt is [facility] hear [facility] about [facility].   ~  April 06, 2009:  his weight is 291# down only 3# in 6 mo- ?on diet "I watch what I eat" and exercise= walking 2 mi/d... started on CPAP w/ ramp to 20cm per [facility]... due for f/u FLP.   ~  Oct 05, 2009:  his head is now shaved, works for [employer], "leaves nothing to grab"...  He saw DrGollan at [facility] for f/u HBP, Hyperlipid, Obesity & OSA- BP controlled on med, due for f/u FLP on his 3 meds, still sees [facility] who wrote for his CPAP... 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 I refused this & explained why> rec diet + exercise... Saw DrDahlstadt 4/11 for his Hypogonadism- he had Testopel Implants but 1 pellet site got infected & 2 pellets popped out... Rx by Urology, they plan f/u Testosterone level in June... 11/23/09--Presents for  work in visit. Noticed a red spot on right  chest area x2weeks. Noticed that he a dark red spot along right lower chest wall, it has lightened over last week and is only faintly seen. Does not remember insect bite, no knnwn tick bite/exposure. Area does not itch, no pain, abd pain, fever, new meds or skin products. No previous episodes.   Medications Prior to Update: 1)  Aspirin 81 Mg Tbec (Aspirin) .... Take One Tablet By Mouth Daily 2)  Exforge Hct 10-320-25 Mg Tabs (Amlodipine-Valsartan-Hctz) .... Take 1 Tablet By Mouth Once A Day 3)  Simvastatin 40 Mg  Tabs (Simvastatin) .... Take One Tablet By Mouth At Bedtime 4)  Niacin Cr 500 Mg Cr-Tabs (Niacin) .... Take 1 Tab By Mouth Daily.Marland KitchenMarland Kitchen 5)  Fish Oil 1000 Mg  Caps (Omega-3 Fatty Acids) .... Take One Capsule By Mouth Two Times A Day 6)  Omeprazole 20 Mg  Cpdr (Omeprazole) .... Take 1 Tablet By Mouth Once A Day 7)  Proctosol Hc 2.5 %  Crea (Hydrocortisone) .... Apply As Directed After Each Bm... 8)  Viagra 100 Mg  Tabs (Sildenafil Citrate) .... Use As Directed... 9)  Vicodin 5-500 Mg  Tabs (Hydrocodone-Acetaminophen) .... Take One Tablet By Mouth  Every 6-8 Hours As Needed   Not To Exceed 3 Per Day 10)  Zoloft 25 Mg Tabs (Sertraline Hcl) .Marland Kitchen.. 1 Once Daily 11)  Alprazolam 0.5 Mg  Tabs (Alprazolam) .Marland Kitchen.. 1 Tab By Mouth Three Times A Day As Needed For Nerves... 12)  Multivitamins   Tabs (Multiple Vitamin) .... Take 1 Tablet By Mouth Once A Day 13)  Chromium Picolinate 200 Mcg Tabs (Chromium Picolinate) .Marland Kitchen.. 1 Tab Daily 14)  Cinnamon 500 Mg Caps (Cinnamon) .Marland Kitchen.. 1 Tab Daily 15)  Potassium Gluconate 550 Mg Tabs (Potassium Gluconate) .Marland Kitchen.. 1 By Mouth Once Daily 16)  Apple Cider Vinegar 500 Mg Tabs (Apple Cider Vinegar) .... 4 Tsp 17)  Olive Oil  Oil (Olive Oil) .... 2 Tsp Daily  Current Medications (verified): 1)  Aspirin 81 Mg Tbec (Aspirin) .... Take One Tablet By Mouth Daily 2)  Exforge Hct 10-320-25 Mg Tabs (Amlodipine-Valsartan-Hctz) .... Take 1 Tablet  By Mouth Once A Day 3)  Simvastatin 40 Mg  Tabs (Simvastatin) .... Take One Tablet By Mouth At Bedtime 4)  Niacin Cr 500 Mg Cr-Tabs (Niacin) .... Take 1 Tab By Mouth Daily.Marland KitchenMarland Kitchen 5)  Fish Oil 1000 Mg  Caps (Omega-3 Fatty Acids) .... Take One Capsule By Mouth Two Times A Day 6)  Omeprazole 20 Mg  Cpdr (Omeprazole) .... Take 1 Tablet By Mouth Once A Day 7)  Proctosol Hc 2.5 %  Crea (Hydrocortisone) .... Apply As Directed After Each Bm... 8)  Viagra 100 Mg  Tabs (Sildenafil Citrate) .... Use As Directed... 9)  Vicodin 5-500 Mg  Tabs (Hydrocodone-Acetaminophen) .... Take One Tablet By Mouth Every 6-8 Hours As Needed   Not To Exceed 3 Per Day 10)  Zoloft 25 Mg Tabs (Sertraline Hcl) .Marland Kitchen.. 1 Once Daily 11)  Alprazolam 0.5 Mg  Tabs (Alprazolam) .Marland Kitchen.. 1 Tab By Mouth Three Times A Day As Needed For Nerves... 12)  Multivitamins   Tabs (Multiple Vitamin) .... Take 1 Tablet By Mouth Once A Day 13)  Chromium Picolinate 200 Mcg Tabs (Chromium Picolinate) .Marland Kitchen.. 1 Tab Daily 14)  Cinnamon 500 Mg Caps (Cinnamon) .Marland Kitchen.. 1 Tab Daily 15)  Potassium Gluconate 550 Mg Tabs (Potassium Gluconate) .Marland Kitchen.. 1 By Mouth Once Daily 16)  Apple Cider Vinegar 500 Mg Tabs (Apple Cider Vinegar) .... 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 Arthroscopic subacromial decompression. Open resection, distal right clavicle.  Family History: Last updated: 11-11-07 mother died---pt not sure of age or reason father died at age 26 from internal complications 1 sibling 1 brother died at age 62 from complications of pneumonia  Social History: Last updated: 11-Nov-2007 pt is divorced has 1 child pt is a long distance driver smokes an occ. cigar social etoh  Risk Factors: Alcohol Use: 0 (09/07/2009) Caffeine Use: 2 (09/07/2009) Exercise: yes (09/07/2009)  Risk Factors: Smoking Status: never (09/07/2009) Cigars/wk: 2-3 daily (09/07/2009)  Review of Systems      See HPI  Vital Signs:  Patient profile:   60 year old male Height:  72 inches Weight:      302 pounds BMI:     41.11 O2 Sat:      96 % on Room air Temp:     99.0 degrees F oral Pulse rate:   98 / minute BP sitting:   136 / 74  (left arm) Cuff size:   large  Vitals Entered By: Boone Master CNA/MA (November 23, 2009 10:47 AM)  O2 Flow:  Room air CC: red spot on right chest area x2weeks, denies pain/itching Is Patient Diabetic? No Comments Medications reviewed with patient Daytime contact number verified with patient. Boone Master CNA/MA  November 23, 2009 10:46 AM    Physical Exam  Additional Exam:  WD, Obese, 60 y/o WM in NAD... GENERAL:  Alert & oriented; pleasant & cooperative. HEENT:  [place]/AT, EOM-wnl, PERRLA, EACs-clear, TMs-wnl, NOSE-clear, THROAT-clear & wnl. NECK:  Supple w/ fairROM; no JVD; normal carotid impulses w/o bruits; no thyromegaly or nodules palpated; no lymphadenopathy. CHEST:  Clear to P & A; without wheezes/ rales/ or rhonchi. HEART:  Regular Rhythm; gr 1/6 SEM, without rubs or gallops... ABDOMEN:  Obese soft & nontender; normal bowel sounds; no organomegaly or masses detected.  EXT: without deformities, mild arthritic changes; no varicose veins/ +venous insuffic  NEURO:  no focal neuro deficits... DERM:  Right  circular flesh colored patch  ~0.5cm ,  upper abd/rib area no streaking, flat appearance, no other abnormal lesions noted.     Impression & Recommendations:  Problem # 1:  SKIN LESION (ICD-709.9)  ? etiology, appears to be resolving. possible dermatitis.  advised to keep watch on, it reoccurs can send to dermatiology to evaluate.  REC:  Apply hydrocortisone  cream to area two times a day for 7 days If this does not resolve, we can refer to dermatology Please contact office for sooner follow up if symptoms do not improve or worsen   Orders: Est. Patient Level II (08657)  Complete Medication List: 1)  Aspirin 81 Mg Tbec (Aspirin) .... Take one tablet by mouth daily 2)  Exforge Hct 10-320-25 Mg Tabs (Amlodipine-valsartan-hctz) .... Take 1 tablet by mouth once a day 3)  Simvastatin 40 Mg Tabs (Simvastatin) .... Take one tablet by mouth at bedtime 4)  Niacin Cr 500 Mg Cr-tabs (Niacin) .... Take 1 tab by mouth daily.Marland KitchenMarland Kitchen 5)  Fish Oil 1000 Mg Caps (Omega-3 fatty acids) .... Take one capsule by mouth two times a day 6)  Omeprazole 20 Mg Cpdr (Omeprazole) .... Take 1 tablet by mouth once a day 7)  Proctosol Hc 2.5 % Crea (Hydrocortisone) .... Apply as directed after each bm... 8)  Viagra 100 Mg Tabs (Sildenafil citrate) .... Use as directed... 9)  Vicodin 5-500 Mg Tabs (Hydrocodone-acetaminophen) .... Take one tablet by mouth every 6-8 hours as needed   not to exceed 3 per day 10)  Zoloft 25 Mg Tabs (Sertraline hcl) .Marland Kitchen.. 1 once daily 11)  Alprazolam 0.5 Mg Tabs (Alprazolam) .Marland Kitchen.. 1 tab by mouth three times a day as needed for nerves... 12)  Multivitamins Tabs (Multiple vitamin) .... Take 1 tablet by mouth once a day 13)  Chromium Picolinate 200 Mcg Tabs (Chromium picolinate) .Marland Kitchen.. 1 tab daily 14)  Cinnamon 500 Mg Caps (Cinnamon) .Marland Kitchen.. 1 tab daily 15)  Potassium Gluconate 550 Mg Tabs (Potassium gluconate) .Marland Kitchen.. 1 by mouth once daily 16)  Apple Cider Vinegar 500 Mg Tabs (Apple cider vinegar) .... 4  tsp  Patient Instructions: 1)  Apply hydrocortisone  cream to area two times a  day for 7 days 2)  If this does not resolve, we can refer to dermatology 3)  Please contact office for sooner follow up if symptoms do not improve or worsen    Immunization History:  Influenza Immunization History:    Influenza:  historical (03/06/2009)

## 2010-07-06 NOTE — Progress Notes (Signed)
Summary: prescript  Phone Note Call from Patient   Caller: Patient Call For: nadel Summary of Call: need exforge hct 10-320/25mg  omeprazole20mg  simvastatin40mg   cv [phone] Initial call taken by: Rickard Patience,  July 21, 2009 10:39 AM  Follow-up for Phone Call        6 refills on each sent electronically yesterday.  called [facility] and they stated they do not have have them.  gave verbal for the 6 refills on each of the omeprazole, simvastatin and exforge. Boone Master CNA  July 21, 2009 10:48 AM   pt advised rx sent. Carron Curie CMA  July 21, 2009 10:56 AM

## 2010-07-06 NOTE — Letter (Signed)
Summary: [facility]   Imported By: Sherian Rein 07/17/2009 12:33:52  _____________________________________________________________________  External Attachment:    Type:   Image     Comment:   External Document

## 2010-07-06 NOTE — Assessment & Plan Note (Signed)
Summary: np6  Medications Added POTASSIUM GLUCONATE 550 MG TABS (POTASSIUM GLUCONATE) 1 by mouth once daily PHENTERMINE HCL 37.5 MG CAPS (PHENTERMINE HCL) 1 by mouth once daily APPLE CIDER VINEGAR 500 MG TABS (APPLE CIDER VINEGAR) 4 tsp OLIVE OIL  OIL (OLIVE OIL) 2 tsp daily      Allergies Added: NKDA  Visit Type:  NP Referring Provider:  IDPOEU Primary Provider:  Dr. Michele Mcalpine  CC:  no complaints.  History of Present Illness: Dakota Ellison is a pleasant 60 year old gentleman that I know from [facility] with a history of obesity, obstructive sleep apnea, hypertension, hyperlipidemia who presents to reestablish care.  He states that overall he has been doing well. Continues to struggle with his weight. He is single, cooks for himself and states that he buys the wrong foods. He's been trying to do some walking in an effort to lose weight though states that overall he just eats too much.  He denies any other symptoms of shortness of breath or chest discomfort. He does have a small area of draining pus on the right buttock where some testosterone seeds were implanted by a urologist in [place].  He was last seen by Dr. Tresa Endo for OSA. He is able to tolerate his CPAP.  cholesterol in November 2010 is 135, LDL 70, HDL 36.  Last echocardiogram in March 2008 showing normal systolic function, right ventricle mildly dilated, mild mitral regurg, mild tricuspid regurg.  Last stress test in March 2008 where he achieved 10 mets, exercised for 9 minutes, no ischemia noted. Overall a normal,  low-risk scan.   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 Take One Tablet By Mouth Daily 2)  Exforge Hct 10-320-25 Mg Tabs (Amlodipine-Valsartan-Hctz) .... Take 1 Tablet By Mouth Once A Day 3)  Simvastatin 40 Mg  Tabs (Simvastatin) .... Take One Tablet By Mouth At Bedtime 4)  Niacin Cr 500 Mg Cr-Tabs (Niacin) .... Take 1 Tab By Mouth Two Times A Day... 5)  Fish Oil 1000 Mg  Caps (Omega-3 Fatty Acids) .... Take One Capsule By Mouth Two Times A Day 6)  Omeprazole 20 Mg  Cpdr (Omeprazole) .... Take 1 Tablet By Mouth Once A Day 7)  Proctosol Hc 2.5 %  Crea (Hydrocortisone) .... Apply As Directed After Each Bm... 8)  Testim 1 % Gel (Testosterone) .... As Directed 9)  Viagra 100 Mg  Tabs (Sildenafil Citrate) .... Use As Directed... 10)  Vicodin 5-500 Mg  Tabs (Hydrocodone-Acetaminophen) .... Take One Tablet By Mouth Every 6-8 Hours As Needed   Not To Exceed 3 Per Day 11)  Zoloft 25 Mg Tabs (Sertraline Hcl) .Marland Kitchen.. 1 Once Daily 12)  Alprazolam 0.5 Mg  Tabs (Alprazolam) .Marland Kitchen.. 1 Tab By Mouth Three Times A Day As Needed For Nerves... 13)  Multivitamins   Tabs (Multiple Vitamin) .... Take 1 Tablet By Mouth Once A Day 14)  Chromium Picolinate 200 Mcg Tabs (Chromium Picolinate) .Marland Kitchen.. 1 Tab Daily 15)  Cinnamon 500 Mg Caps (Cinnamon) .Marland Kitchen.. 1 Tab Daily 16)  Potassium Gluconate 550 Mg Tabs (Potassium Gluconate) .Marland Kitchen.. 1 By Mouth Once Daily 17)  Phentermine Hcl 37.5 Mg Caps (Phentermine Hcl) .Marland Kitchen.. 1 By Mouth Once Daily 18)  Apple Cider Vinegar 500 Mg Tabs (Apple Cider Vinegar) .... 4 Tsp 19)   Olive Oil  Oil (Olive Oil) .... 2 Tsp Daily  Allergies (verified): No Known Drug Allergies  Past History:  Past Medical History: Last updated: 04/06/2009 OBSTRUCTIVE SLEEP APNEA (ICD-327.23) Hx of METHICILLIN RESISTANT STAPHYLOCOCCUS AUREUS INFECTION (ICD-041.19) ASTHMA, MILD (ICD-493.90) HYPERTENSION (ICD-401.9) HYPERLIPIDEMIA (ICD-272.4) MORBID OBESITY (ICD-278.01) GERD (ICD-530.81) RECTAL FISSURE (ICD-565.0) TESTOSTERONE DEFICIENCY (ICD-257.2) ERECTILE DYSFUNCTION (ICD-302.72) OSTEOARTHRITIS (ICD-715.90) DEGENERATIVE DISC DISEASE (ICD-722.6) DYSTHYMIA (ICD-300.4)  Past Surgical History: Last updated: 08/25/2009 Internal sphincterotomy Right shoulder arthroscopy with: Shaving of underneath surface of rotator cuff, Minimal debridement of long end biceps tendon and glenoid labrum. Arthroscopic subacromial decompression. Open resection, distal right clavicle.  Family History: Last updated: 2007/11/23 mother died---pt not sure of age or reason father died at age 33 from internal complications 1 sibling 1 brother died at age 10 from complications of pneumonia  Social History: Last updated: 11-23-07 pt is divorced has 1 child pt is a long distance driver smokes an occ. cigar social etoh  Risk Factors: Alcohol Use: 0 (09/07/2009) Caffeine Use: 2 (09/07/2009) Exercise: yes (09/07/2009)  Risk Factors: Smoking Status: never (09/07/2009) Cigars/wk: 2-3 daily (09/07/2009)  Social History: Smoking Status:  never Cigars/week:  2-3 daily Caffeine use/day:  2 Does Patient Exercise:  yes Alcohol drinks/day:  0  Review of Systems       The patient complains of weight gain.  The patient denies fever, vision loss, decreased hearing, hoarseness, chest pain, syncope, dyspnea on exertion, peripheral edema, prolonged cough, abdominal pain, incontinence, muscle weakness, depression, and enlarged lymph nodes.    Vital Signs:  Patient profile:   60 year old male Height:       72 inches Weight:      289.75 pounds BMI:     39.44 Pulse rate:   88 / minute Pulse rhythm:   regular BP sitting:   102 / 76  (left arm) Cuff size:   large  Vitals Entered By: Dakota Ellison (September 07, 2009 10:10 AM)  Physical Exam  General:  obese gentleman in no apparent distress, HEENT exam is benign, oropharynx is clear, neck is supple with no JVP or carotid bruits, heart sounds are regular with S1-S2 and no murmurs appreciated, lungs are clear to auscultation with no wheezes or rales, no significant lower extremity edema, neurologic exam is nonfocal, skin is warm and dry. Pulses are equal and symmetrical in his upper and lower extremities.   Impression & Recommendations:  Problem # 1:  HYPERTENSION (ICD-401.9) blood pressure is borderline low today. He has no symptoms of lightheadedness or dizziness. I have asked him to monitor his pressure. If he does start losing weight, we may need to decrease the dose of his exforge hct.  His updated medication list for this problem includes:    Aspirin 81 Mg Tbec (Aspirin) .Marland Kitchen... Take one tablet by mouth daily    Exforge Hct 10-320-25 Mg Tabs (Amlodipine-valsartan-hctz) .Marland KitchenMarland KitchenMarland KitchenMarland Kitchen  Take 1 tablet by mouth once a day  Problem # 2:  HYPERLIPIDEMIA (ICD-272.4) His cholesterol is well controlled on simvastatin 40 mg daily.  His updated medication list for this problem includes:    Simvastatin 40 Mg Tabs (Simvastatin) .Marland Kitchen... Take one tablet by mouth at bedtime    Niacin Cr 500 Mg Cr-tabs (Niacin) .Marland Kitchen... Take 1 tab by mouth two times a day...  Problem # 3:  OBSTRUCTIVE SLEEP APNEA (ICD-327.23) He has obesity and sleep apnea. We have talked to him extensively about his weight and he will continue to try to exercise more. We have given him a dietary died to follow.  He was recently seen by Dr. Tresa Endo for sleep apnea. He states that his current settings work well.  Problem # 4:  Hx of METHICILLIN RESISTANT STAPHYLOCOCCUS AUREUS INFECTION  (ICD-041.19) the notes indicate a history of MRSA. he does have a small abscess on his right buttock where his testosterone seeds were placed. I Have asked him to contact the urologist for this to be evaluated. This may need irrigation and debridement while it is small. It is open and actively draining.

## 2010-07-06 NOTE — Progress Notes (Signed)
Summary: rx for viagra  Phone Note Call from Patient Call back at Home Phone [phone]   Caller: Patient Call For: Carmen Vallecillo Reason for Call: Refill Medication Summary of Call: pt would like rx for his Viagra sent to his home. Initial call taken by: Eugene Gavia,  June 09, 2009 10:59 AM  Follow-up for Phone Call        printed rx. gave to leigh to have SN sign.  Pt aware rx mailed to home address.  Aundra Millet Reynolds LPN  June 09, 2009 11:28 AM   rx has been placed in the mail for pt. Randell Loop CMA  June 09, 2009 12:17 PM      Prescriptions: VIAGRA 100 MG  TABS (SILDENAFIL CITRATE) use as directed...  #10 x 11   Entered by:   Arman Filter LPN   Authorized by:   Michele Mcalpine MD   Signed by:   Arman Filter LPN on 06/08/7251   Method used:   Print then Mail to Patient   RxID:   [id number]

## 2010-07-06 NOTE — Assessment & Plan Note (Signed)
Summary: 6 months/apc   Primary Care Provider:  Dr. Michele Mcalpine  CC:  6 month ROV & review of mult medical problems> wants refill Vicodin & Alpraz today....  History of Present Illness: 60 y/o WM here for an add-on visit... he has multiple medical problems as noted below...     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  saw DrDahlstadt 1/10 for f/u hypogonadism on Testim gel 5gm/d- he did Testosterone level and PSA but we don't have these results... he had right knee arthroscopy 2/10 by DrMurphy & anal fissure surg by DrWeatherly... had Sleep Study 4/10 ordered by DrGollan> pos for OSA w/ AHI= 82, desat to 85% (split night study w/ good control on CPAP 20- pt is waiting to hear from [facility] about this)> started on CPAP w/ ramp to 20cm per [facility].   ~  Oct 05, 2009:  his head is now shaved, works for [employer], "leaves nothing to grab"...    He saw DrGollan at [facility] for f/u HBP, Hyperlipid, Obesity & OSA- BP controlled on med, due for f/u FLP on his 3 meds, still sees [facility] who wrote for his CPAP...   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 I refused this & explained why> rec diet + exercise...   Saw DrDahlstadt 4/11 for his Hypogonadism- he had Testopel Implants but 1 pellet site got infected & 2 pellets popped out... Rx by Urology, they plan f/u Testosterone level.   ~  April 05, 2010:  he's had a good 93mo but unfortunately has gained 15# up to 314# & we discussed renewed diet + exercise efforts for weight reduction... BP controlled on Exforge;  denies CP, palpit, ch in DOE, etc;  FLP not quite as good w/ the wt gain & reminded to take the Simva4,0 Niacin, & FishOil daily... requests refill Vocodin & Rebeca Allegra today;  already had the 2010 flu vaccine.   He  had f/u DrDahlstadt 8/11 w/ 12 Testopel implants placed & due for f/u Testosterone level next month...   Current Problems:   Hx of METHICILLIN RESISTANT STAPHYLOCOCCUS AUREUS INFECTION (ICD-041.19) - he cut himself shaving in 2008 and went to see DrElkins... it got worse and was sent to DrWolicki who hospitalized him 3/08 w/ MRSA infection- Rx's w/ Vancomycin infusions... now resolved.  OBSTRUCTIVE SLEEP APNEA (ICD-327.23) - Apr10> had Sleep Study ordered by DrGollan> pos for OSA w/ AHI= 82, desat to 85%... split night study w/ good control on CPAP 20 (see report)... started on CPAP by [facility] & still followed by him...  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 no recent exac and no regular meds... told to discontinue all smoking> denies much cough, sputum, dyspnea, CP, etc...  HYPERTENSION (ICD-401.9) - followed by DrGollan/ [facility]- on ECASA 325mg /d & EXFORGE 10/320/25 daily + KCl 1mEq/d... BP today = 130/70 doing well... he's had CP while hosp w/ the MRSA and DrWolicki consulted DrMcQueen who did a NuclearStressTest 3/08 which was normal... and a 2DEcho which showed mild MR and nl LVF...  ~ 4/11:  f/u visit DrGollan> note reviewed, no changes made.  HYPERLIPIDEMIA (ICD-272.4) - he's been switched around by [facility] betw Simva/ Caduet/ back to  SIMVASTATIN 40mg /d now + NIACIN 500mg Bid + FISH OIL 1000mg Bid...  still not on any kind of diet but his weight is stable at 294#.Marland Kitchen.  ~  FLP 1/08 showed TChol 195, TG 119, HDL 41, LDL 131  ~  FLP 2/09 showed TChol 214, TG 214, HDL 39, LDL 127  ~  12/09: recent labs by [facility]- we don't have these results...  ~  FLP 5/10 on 3 meds above showed TChol 148, TG 81, HDL 48, LDL 84  ~  FLP 11/10 on 3 meds showed TChol 135, TG 145, HDL 36, LDL 70  ~  FLP 5/11 showed TChol 145, TG 128, HDL 43, LDL 77... keep same Rx.  ~  FLP 10/11 showed TChol 152, TG 198, HDL 41, LDL 72... same meds, better diet, get wt down!  MORBID OBESITY (ICD-278.01)  - despite all efforts weight is up to 2314# now... discussed wt watchers, nutrition center, poss bariatric approach... he's not been able to exercise but will join the Y & start diet program!  ~  5/11:  he reports that DrElkins gave him 30d supply of Phenteramine which really helped- lost 20#, but put it all back + 9# more off this med... we discussed the dangers of diet pills & rec diet + exercise, neither of which he has tried in earnest!  ~  10/11:  weight up 15# to 314# today> discussed diet, exercise, etc...  GERD (ICD-530.81) - he had an EGD from [facility] 4/03 which was neg... he takes OMEPRAZOLE 20mg /d for reflux symptoms which works well...  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 prev tried Diltiazem cream etc...  ~  pt reports colonoscopy by [facility] 7/03- reported neg & f/u planned 5 yrs.  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 then changed to Testopel Implants & followed by Urology... also takes Viagra Prn...   ~  labs 5/10 showed PSA = 0.85  ~  labs 5/11 showed PSA= 0.65  OSTEOARTHRITIS (ICD-715.90) DEGENERATIVE DISC DISEASE (ICD-722.6) - followed by DrMurphy for right knee pain... prev seen by Mayford Knife for DDD and LBP...  ~  s/p left CTS surg 2009  ~  s/p right knee arthroscopy by DrMurphy 2/10... also had cortisone shot in knee 4/10  ~  s/p trigger finger injections by DrGramig  DYSTHYMIA (ICD-300.4) - some stress, some depression which is mild by his determination... he tried Lexapro last year but didn't follow up... now using Xanax as needed, and started ZOLOFT 25mg /d in Feb10- he reports better, wants to continue this dose.   Preventive Screening-Counseling & Management  Alcohol-Tobacco     Alcohol drinks/day: 0     Smoking Status: current     Cigars/week: 2-3 daily  Caffeine-Diet-Exercise     Caffeine use/day: 2     Does  Patient Exercise: yes  Comments: smokes an occasional cigar  Allergies (verified): No Known Drug Allergies  Comments:  Nurse/Medical Assistant: The patient's medications and allergies were reviewed with the patient and were updated in the Medication and Allergy Lists.  Past History:  Past Medical History: OBSTRUCTIVE SLEEP APNEA (ICD-327.23) Hx of METHICILLIN RESISTANT STAPHYLOCOCCUS AUREUS INFECTION (ICD-041.19) ASTHMA, MILD (ICD-493.90) HYPERTENSION (ICD-401.9) HYPERLIPIDEMIA (ICD-272.4) MORBID OBESITY (ICD-278.01) GERD (ICD-530.81) RECTAL FISSURE (ICD-565.0) TESTOSTERONE DEFICIENCY (ICD-257.2) ERECTILE DYSFUNCTION (ICD-302.72) OSTEOARTHRITIS (ICD-715.90) DEGENERATIVE DISC DISEASE (ICD-722.6) DYSTHYMIA (ICD-300.4)  Past Surgical History: Internal sphincterotomy Right shoulder arthroscopy with: Shaving of underneath surface of rotator cuff,  Minimal debridement of long end biceps tendon and glenoid labrum. Arthroscopic subacromial decompression. Open resection, distal right clavicle  Family History: Reviewed history from 10/25/2007 and no changes required. mother died---pt not sure of age or reason father died at age 45 from internal complications 1 sibling 1 brother died at age 71 from complications of pneumonia  Social History: Reviewed history from 10/25/2007 and no changes required. 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  The patient denies anorexia, fever, weight loss, weight gain, vision loss, decreased hearing, hoarseness, chest pain, syncope, peripheral edema, prolonged cough, headaches, hemoptysis, abdominal pain, melena, hematochezia, severe indigestion/heartburn, hematuria, incontinence, muscle weakness, suspicious skin lesions, transient blindness, difficulty walking, depression, unusual  weight change, abnormal bleeding, enlarged lymph nodes, and angioedema.    Vital Signs:  Patient profile:   60 year old male Height:      72 inches Weight:      313.50 pounds BMI:     42.67 O2 Sat:      97 % on Room air Temp:     98.1 degrees F oral Pulse rate:   104 / minute BP sitting:   130 / 70  (right arm) Cuff size:   large  Vitals Entered By: Randell Loop CMA (April 05, 2010 10:06 AM)  O2 Sat at Rest %:  97 O2 Flow:  Room air CC: 6 month ROV & review of mult medical problems> wants refill Vicodin & Alpraz today... Is Patient Diabetic? No Pain Assessment Patient in pain? no      Comments no changes in meds today   Physical Exam  Additional Exam:  WD, Obese, 60 y/o WM in NAD... GENERAL:  Alert & oriented; pleasant & cooperative. HEENT:  [place]/AT, EOM-wnl, PERRLA, EACs-clear, TMs-wnl, NOSE-clear, THROAT-clear & wnl. NECK:  Supple w/ fairROM; no JVD; normal carotid impulses w/o bruits; no thyromegaly or nodules palpated; no lymphadenopathy. CHEST:  Clear to P & A; without wheezes/ rales/ or rhonchi. HEART:  Regular Rhythm; gr 1/6 SEM, without rubs or gallops... ABDOMEN:  Obese soft & nontender; normal bowel sounds; no organomegaly or masses detected.  EXT: without deformities, mild arthritic changes; no varicose veins/ +venous insuffic/ no edema.right knee w/ ace wrap.  NEURO:  CN's intact; no focal neuro deficits... DERM:  Testopel Implants palpated in his right lat butt cheek area, no infection or drainage seen.    MISC. Report  Procedure date:  04/05/2010  Findings:      BMP (METABOL)   Sodium                    141 mEq/L                   135-145   Potassium                 4.1 mEq/L                   3.5-5.1   Chloride                  98 mEq/L                    96-112   Carbon Dioxide  29 mEq/L                    19-32   Glucose                   86 mg/dL                    60-45   BUN                       15 mg/dL                    4-09    Creatinine                0.9 mg/dL                   8.1-1.9   Calcium                   8.8 mg/dL                   1.4-78.2   GFR                       88.53 mL/min                >60   Lipid Panel (LIPID)   Cholesterol               152 mg/dL                   9-562   Triglycerides        [H]  198.0 mg/dL                 1.3-086.5   HDL                       78.46 mg/dL                 >96.29   LDL Cholesterol           72 mg/dL                    5-28   Impression & Recommendations:  Problem # 1:  OBSTRUCTIVE SLEEP APNEA (ICD-327.23) Followed by Howell Rucks, [facility]...  Problem # 2:  ASTHMA, MILD (ICD-493.90) He is reminded to quit all smoking, incr exercise, and get wt down!!!  Problem # 3:  HYPERTENSION (ICD-401.9) Controlled>  same med... get wt down! His updated medication list for this problem includes:    Exforge Hct 10-320-25 Mg Tabs (Amlodipine-valsartan-hctz) .Marland Kitchen... Take 1 tablet by mouth once a day  Orders: TLB-BMP (Basic Metabolic Panel-BMET) (80048-METABOL) TLB-Lipid Panel (80061-LIPID)  Problem # 4:  HYPERLIPIDEMIA (ICD-272.4) FLP not as good w/ recent wt gain>  needs better diet, get wt down... His updated medication list for this problem includes:    Simvastatin 40 Mg Tabs (Simvastatin) .Marland Kitchen... Take one tablet by mouth at bedtime    Niacin Cr 500 Mg Cr-tabs (Niacin) .Marland Kitchen... Take 1 tab by mouth daily...  Problem # 5:  MORBID OBESITY (ICD-278.01) As noted above...  Problem # 6:  TESTOSTERONE DEFICIENCY (ICD-257.2) Followed by Urology w/ Testopel imlants...  Problem # 7:  DEGENERATIVE DISC DISEASE (ICD-722.6) Stable>  followed by Ortho, needs incr exercise program... requests Vicodin refill.  Problem # 8:  DYSTHYMIA (ICD-300.4) Continue current meds>  refilled.  Complete Medication List: 1)  Aspirin 81 Mg Tbec (Aspirin) .... Take one tablet by mouth daily 2)  Exforge Hct 10-320-25 Mg Tabs (Amlodipine-valsartan-hctz) .... Take 1 tablet by mouth once a day 3)   Simvastatin 40 Mg Tabs (Simvastatin) .... Take one tablet by mouth at bedtime 4)  Niacin Cr 500 Mg Cr-tabs (Niacin) .... Take 1 tab by mouth daily.Marland KitchenMarland Kitchen 5)  Fish Oil 1000 Mg Caps (Omega-3 fatty acids) .... Take one capsule by mouth two times a day 6)  Omeprazole 20 Mg Cpdr (Omeprazole) .... Take 1 tablet by mouth once a day 7)  Proctosol Hc 2.5 % Crea (Hydrocortisone) .... Apply as directed after each bm... 8)  Viagra 100 Mg Tabs (Sildenafil citrate) .... Use as directed... 9)  Vicodin 5-500 Mg Tabs (Hydrocodone-acetaminophen) .... Take one tablet by mouth every 6-8 hours as needed   not to exceed 3 per day 10)  Zoloft 25 Mg Tabs (Sertraline hcl) .Marland Kitchen.. 1 once daily 11)  Alprazolam 0.5 Mg Tabs (Alprazolam) .Marland Kitchen.. 1 tab by mouth three times a day as needed for nerves... 12)  Multivitamins Tabs (Multiple vitamin) .... Take 1 tablet by mouth once a day 13)  Chromium Picolinate 200 Mcg Tabs (Chromium picolinate) .Marland Kitchen.. 1 tab daily 14)  Cinnamon 500 Mg Caps (Cinnamon) .Marland Kitchen.. 1 tab daily 15)  Potassium Gluconate 550 Mg Tabs (Potassium gluconate) .Marland Kitchen.. 1 by mouth once daily 16)  Apple Cider Vinegar 500 Mg Tabs (Apple cider vinegar) .... 4 tsp  Patient Instructions: 1)  Today we updated your med list- see below.... 2)  We refilled your Vicodin & Alprazolam as requested... 3)  Today we did your f/u fasting blood work... please call the "phone tree" in a few days for your lab results.Marland KitchenMarland Kitchen 4)  Let's get on track w/ our diet & exercise program... you have GOT to get the weight down!!! 5)  Call for any problems.Marland KitchenMarland Kitchen 6)  Please schedule a follow-up appointment in 6 months. Prescriptions: ALPRAZOLAM 0.5 MG  TABS (ALPRAZOLAM) 1 tab by mouth three times a day as needed for nerves...  #90 x 6   Entered and Authorized by:   Michele Mcalpine MD   Signed by:   Michele Mcalpine MD on 04/05/2010   Method used:   Print then Give to Patient   RxID:   [id number] VICODIN 5-500 MG  TABS (HYDROCODONE-ACETAMINOPHEN) take one tablet  by mouth every 6-8 hours as needed   NOT TO EXCEED 3 PER DAY  #90 x 6   Entered and Authorized by:   Michele Mcalpine MD   Signed by:   Michele Mcalpine MD on 04/05/2010   Method used:   Print then Give to Patient   RxID:   [id number]    Immunization History:  Influenza Immunization History:    Influenza:  historical (03/08/2010)   Appended Document: 6 months/apc "Phone Tree" message recorded... SN  needs better diet, get wt down.Marland KitchenMarland Kitchen

## 2010-07-06 NOTE — Assessment & Plan Note (Signed)
Summary: 6 months/apc   Primary Care Provider:  Dr. Michele Mcalpine  CC:  6 month ROV & review of mult medical problems....  History of Present Illness: 60 y/o WM here for an add-on visit... he has multiple medical problems as noted below...     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      ** Jan10- saw DrDahlstadt for f/u hypogonadism on Testim gel 5gm/d- he did Testosterone level and PSA but we don't have these results...      ** Feb10- had right knee arthroscopy by DrMurphy... also required more recent tap & shot in knee.      ** Feb10- saw NP w/ weakness, low energy, low interest, etc... tried Zoloft 25mg /d & seems to help.      ** Feb10- had anal fissure surg by DrWeatherly...      ** Apr10- had Sleep Study ordered by DrGollan> pos for OSA w/ AHI= 82, desat to 85%... split night study w/ good control on CPAP 20 (see report)... pt is waiting to hear from [facility] about this.   ~  April 06, 2009:  his weight is 291# down only 3# in 6 mo- ?on diet "I watch what I eat" and exercise= walking 2 mi/d... started on CPAP w/ ramp to 20cm per [facility]... due for f/u FLP.   ~  Oct 05, 2009:  his head is now shaved, works for [employer], "leaves nothing to grab"...  He saw DrGollan at [facility] for f/u HBP, Hyperlipid, Obesity & OSA- BP controlled on med, due for f/u FLP on his 3 meds, still sees [facility] who wrote for his CPAP... 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 I refused this & explained why> rec diet + exercise... Saw DrDahlstadt 4/11 for his Hypogonadism- he had Testopel Implants but 1 pellet site got infected & 2 pellets popped out... Rx by Urology, they plan f/u Testosterone level in June...    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 it got worse and was sent to DrWolicki who hospitalized him 3/08 w/ MRSA infection- Rx's w/ Vancomycin infusions... now resolved.  OBSTRUCTIVE SLEEP APNEA (ICD-327.23) - Apr10> had Sleep Study ordered by DrGollan> pos for OSA w/ AHI= 82, desat to 85%... split night study w/ good control on CPAP 20 (see report)... started on CPAP by [facility] & still followed by him...  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 no recent exac and no regular meds... told to discontinue all smoking.  HYPERTENSION (ICD-401.9) - followed by DrGollan/ [facility]- on ECASA 325mg /d & EXFORGE 10/320/25 daily + KCl 18mEq/d... BP today = 120/72 doing well... he's had CP while hosp w/ the MRSA and DrWolicki consulted DrMcQueen who did a NuclearStressTest 3/08 which was normal... and a 2DEcho which showed mild MR and nl LVF...  ~ 4/11:  f/u visit DrGollan> note reviewed, no changes made.  HYPERLIPIDEMIA (ICD-272.4) - he's been switched around by [facility] betw Simva/ Caduet/ back to SIMVASTATIN 40mg /d now + NIACIN 500mg Bid + FISH OIL 1000mg Bid...  still not on any kind of diet but his weight is  stable at 294#.Marland Kitchen.  ~  FLP 1/08 showed TChol 195, TG 119, HDL 41, LDL 131  ~  FLP 2/09 showed TChol 214, TG 214, HDL 39, LDL 127  ~  12/09: recent labs by [facility]- we don't have these results...  ~  FLP 5/10 on 3 meds above showed TChol 148, TG 81, HDL 48, LDL 84  ~  FLP 11/10 on 3 meds showed TChol 135, TG 145, HDL 36, LDL 70  ~  FLP 5/11 showed TChol 145, TG 128, HDL 43, LDL 77... keep same Rx.  MORBID OBESITY (ICD-278.01) - despite all efforts weight is up to 299# now... discussed wt watchers, nutrition center, poss bariatric approach... he's not been able to exercise but will join the Y & start diet program!  ~  5/11:  he reports that DrElkins gave him 30d supply of Phenteramine which really helped- lost 20#, but  put it all back + 9# more off this med... we discussed the dangers of diet pills & rec diet + exercise, neither of which he has tried in earnest!  GERD (ICD-530.81) - he had an EGD from [facility] 4/03 which was neg... he takes OMEPRAZOLE 20mg /d for reflux symptoms which works well...  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 prev tried Diltiazem cream etc...  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 then changed to Testopel Implants, but one got infected, and 2 pellets came out, Rx & followed by Urology... also takes Viagra Prn...   ~  labs 5/10 showed PSA = 0.85  ~  labs 5/11 showed PSA= 0.65  OSTEOARTHRITIS (ICD-715.90) DEGENERATIVE DISC DISEASE (ICD-722.6) - followed by DrMurphy for right knee pain... prev seen by Mayford Knife for DDD and LBP...  ~  s/p right knee arthroscopy by drMurphy 2/10... also had cortisone shot in knee 4/10...  DYSTHYMIA (ICD-300.4) - some stress, some depression which is mild by his determination... he tried Lexapro last year but didn't follow up... now using Xanax as needed, and started ZOLOFT 25mg /d in Feb10- he reports better, wants to continue this dose.   Allergies (verified): No Known Drug Allergies  Comments:  Nurse/Medical Assistant: The patient's medications and allergies were reviewed with the patient and were updated in the Medication and Allergy Lists.  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 Arthroscopic subacromial decompression. Open resection, distal right clavicle.  Family History: Reviewed history from 10/25/2007 and no changes required. mother died---pt not sure of age or reason father died at age 29 from internal complications 1 sibling 1 brother died at age 73 from complications of pneumonia  Social History: Reviewed history from 10/25/2007 and no changes required. pt is divorced has 1 child pt is a long distance driver smokes an occ. cigar social etoh  Review of Systems      See HPI       The patient complains of dyspnea on exertion.  The patient denies anorexia, fever, weight loss, weight gain, vision loss, decreased hearing, hoarseness, chest pain, syncope, peripheral edema, prolonged cough,  headaches, hemoptysis, abdominal pain, melena, hematochezia, severe indigestion/heartburn, hematuria, incontinence, muscle weakness, suspicious skin lesions, transient blindness, difficulty walking, depression, unusual weight change, abnormal bleeding, enlarged lymph nodes, and angioedema.    Vital Signs:  Patient profile:   60 year old male Height:      72 inches Weight:      298.50 pounds BMI:     40.63 O2 Sat:      97 % on Room air Temp:     96.9 degrees F oral Pulse rate:   80 / minute BP sitting:   120 / 72  (right arm) Cuff size:   large  Vitals Entered By: Randell Loop CMA (Oct 05, 2009 10:27 AM)  O2 Sat at Rest %:  97 O2 Flow:  Room air CC: 6 month ROV & review of mult medical problems... Is Patient Diabetic? No Pain Assessment Patient in pain? no      Comments meds updated today   Physical Exam  Additional Exam:  WD, Obese, 60 y/o WM in NAD... GENERAL:  Alert & oriented; pleasant & cooperative. HEENT:  [place]/AT, EOM-wnl, PERRLA, EACs-clear,  TMs-wnl, NOSE-clear, THROAT-clear & wnl. NECK:  Supple w/ fairROM; no JVD; normal carotid impulses w/o bruits; no thyromegaly or nodules palpated; no lymphadenopathy. CHEST:  Clear to P & A; without wheezes/ rales/ or rhonchi. HEART:  Regular Rhythm; gr 1/6 SEM, without rubs or gallops... ABDOMEN:  Obese soft & nontender; normal bowel sounds; no organomegaly or masses detected.  EXT: without deformities, mild arthritic changes; no varicose veins/ +venous insuffic/ no edema.right knee w/ ace wrap.  NEURO:  CN's intact; no focal neuro deficits... DERM:  Testopel Implants palpated in his right lat butt cheek area, no ifection or drainage now...    MISC. Report  Procedure date:  10/05/2009  Findings:      BMP (METABOL)   Sodium                    141 mEq/L                   135-145   Potassium                 4.7 mEq/L                   3.5-5.1   Chloride                  103 mEq/L                   96-112   Carbon Dioxide            32 mEq/L                    19-32   Glucose              [H]  103 mg/dL                   16-10   BUN                       19 mg/dL                    9-60   Creatinine                1.0 mg/dL  0.4-1.5   Calcium                   9.4 mg/dL                   1.6-10.9   GFR                       81.56 mL/min                >60  Hepatic/Liver Function Panel (HEPATIC)   Total Bilirubin           0.5 mg/dL                   6.0-4.5   Direct Bilirubin          0.1 mg/dL                   4.0-9.8   Alkaline Phosphatase      61 U/L                      39-117   AST                       27 U/L                      0-37   ALT                       27 U/L                      0-53   Total Protein             7.3 g/dL                    1.1-9.1   Albumin                   4.3 g/dL                    4.7-8.2  CBC Platelet w/Diff (CBCD)   White Cell Count          8.0 K/uL                    4.5-10.5   Red Cell Count            4.78 Mil/uL                  4.22-5.81   Hemoglobin                14.1 g/dL                   95.6-21.3   Hematocrit                40.3 %                      39.0-52.0   MCV                       84.3 fl                     78.0-100.0   Platelet Count            271.0 K/uL  150.0-400.0   Neutrophil %              64.4 %                      43.0-77.0   Lymphocyte %              23.8 %                      12.0-46.0   Monocyte %                7.2 %                       3.0-12.0   Eosinophils%              4.3 %                       0.0-5.0   Basophils %               0.3 %                       0.0-3.0  Comments:      Lipid Panel (LIPID)   Cholesterol               145 mg/dL                   1-308   Triglycerides             128.0 mg/dL                 6.5-784.6   HDL                       96.29 mg/dL                 >52.84   LDL Cholesterol           77 mg/dL                    1-32    TSH (TSH)   FastTSH                   1.90 uIU/mL                 0.35-5.50  Prostate Specific Antigen (PSA)   PSA-Hyb                   0.65 ng/mL                  0.10-4.00    Impression & Recommendations:  Problem # 1:  OBSTRUCTIVE SLEEP APNEA (ICD-327.23) By his account he is doing satis on the CPAP w/ testing & orders from [facility]...  Problem # 2:  HYPERTENSION (ICD-401.9) Controlled- continue med, get on diet, get weight down... His updated medication list for this problem includes:    Exforge Hct 10-320-25 Mg Tabs (Amlodipine-valsartan-hctz) .Marland Kitchen... Take 1 tablet by mouth once a day  Orders: TLB-BMP (Basic Metabolic Panel-BMET) (80048-METABOL) TLB-Hepatic/Liver Function Pnl (80076-HEPATIC) TLB-CBC Platelet - w/Differential (85025-CBCD) TLB-Lipid Panel (80061-LIPID) TLB-TSH (Thyroid Stimulating Hormone) (84443-TSH) TLB-PSA (Prostate Specific Antigen) (84153-PSA)  Problem # 3:  HYPERLIPIDEMIA (ICD-272.4) F/u FLP looks good on these meds>  continue same, get on diet, get weight  down!!! His updated medication list for this problem includes:  Simvastatin 40 Mg Tabs (Simvastatin) .Marland Kitchen... Take one tablet by mouth at bedtime    Niacin Cr 500 Mg Cr-tabs (Niacin) .Marland Kitchen... Take 1 tab by mouth daily...  Problem # 4:  MORBID OBESITY (ICD-278.01) Diet & exercise discussed w/ pt... he is advised to stay away from diet pills.  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 copy of labs to him.  Problem # 6:  DEGENERATIVE DISC DISEASE (ICD-722.6) Stable on Vicodin & he requests refill...  Problem # 7:  DYSTHYMIA (ICD-300.4) Stable on Alprazolam & he requests refill... Orders: Prescription Created Electronically [phone])  Problem # 8:  OTHER MEDICAL PROBLEMS AS NOTED>>>  Complete Medication List: 1)  Aspirin 81 Mg Tbec (Aspirin) .... Take one tablet by mouth daily 2)  Exforge Hct 10-320-25 Mg Tabs (Amlodipine-valsartan-hctz) .... Take 1 tablet by mouth once a day 3)  Simvastatin 40 Mg Tabs (Simvastatin) .... Take one tablet by mouth at bedtime 4)  Niacin Cr 500 Mg Cr-tabs (Niacin) .... Take 1 tab by mouth daily.Marland KitchenMarland Kitchen 5)  Fish Oil 1000 Mg Caps (Omega-3 fatty acids) .... Take one capsule by mouth two times a day 6)  Omeprazole 20 Mg Cpdr (Omeprazole) .... Take 1 tablet by mouth once a day 7)  Proctosol Hc 2.5 % Crea (Hydrocortisone) .... Apply as directed after each bm... 8)  Viagra 100 Mg Tabs (Sildenafil citrate) .... Use as directed... 9)  Vicodin 5-500 Mg Tabs (Hydrocodone-acetaminophen) .... Take one tablet by mouth every 6-8 hours as needed   not to exceed 3 per day 10)  Zoloft 25 Mg Tabs (Sertraline hcl) .Marland Kitchen.. 1 once daily 11)  Alprazolam 0.5 Mg Tabs (Alprazolam) .Marland Kitchen.. 1 tab by mouth three times a day as needed for nerves... 12)  Multivitamins Tabs (Multiple vitamin) .... Take 1 tablet by mouth once a day 13)  Chromium Picolinate 200 Mcg Tabs (Chromium picolinate) .Marland Kitchen.. 1 tab daily 14)  Cinnamon 500 Mg Caps (Cinnamon) .Marland Kitchen.. 1 tab  daily 15)  Potassium Gluconate 550 Mg Tabs (Potassium gluconate) .Marland Kitchen.. 1 by mouth once daily 16)  Apple Cider Vinegar 500 Mg Tabs (Apple cider vinegar) .... 4 tsp 17)  Olive Oil Oil (Olive oil) .... 2 tsp daily  Patient Instructions: 1)  Today we updated your med list- see below.... 2)  We refilled the meds you requested... 3)  Tofay we did your follow up FASTING blood work... please call the "phone tree" in a few days for your lab results.Marland KitchenMarland Kitchen 4)  We will send copies to DrDahlstadt... 5)  Call for any problems.Marland KitchenMarland Kitchen 6)  Please schedule a follow-up appointment in 6 months. Prescriptions: ALPRAZOLAM 0.5 MG  TABS (ALPRAZOLAM) 1 tab by mouth three times a day as needed for nerves...  #90 x 6   Entered and Authorized by:   Michele Mcalpine MD   Signed by:   Michele Mcalpine MD on 10/05/2009   Method used:   Print then Give to Patient   RxID:   [id number] VICODIN 5-500 MG  TABS (HYDROCODONE-ACETAMINOPHEN) take one tablet by mouth every 6-8 hours as needed   NOT TO EXCEED 3 PER DAY  #90 x 6   Entered and Authorized by:   Michele Mcalpine MD   Signed by:   Michele Mcalpine MD on 10/05/2009   Method used:   Print then Give to Patient   RxID:   [id number] VIAGRA 100 MG  TABS (SILDENAFIL CITRATE) use as directed...  #10 x prn   Entered and  Authorized by:   Michele Mcalpine MD   Signed by:   Michele Mcalpine MD on 10/05/2009   Method used:   Print then Give to Patient   RxID:   [id number] PROCTOSOL HC 2.5 %  CREA (HYDROCORTISONE) apply as directed after each BM...  #1 tube x prn   Entered and Authorized by:   Michele Mcalpine MD   Signed by:   Michele Mcalpine MD on 10/05/2009   Method used:   Print then Give to Patient   RxID:   [id number]

## 2010-07-06 NOTE — Letter (Signed)
Summary: Medical Record Release  Medical Record Release   Imported By: Harlon Flor 10/14/2009 14:48:22  _____________________________________________________________________  External Attachment:    Type:   Image     Comment:   External Document

## 2010-07-07 ENCOUNTER — Encounter: Payer: Self-pay | Admitting: Pulmonary Disease

## 2010-07-28 NOTE — Letter (Signed)
Summary: [facility]   Imported By: Sherian Rein 07/21/2010 10:47:03  _____________________________________________________________________  External Attachment:    Type:   Image     Comment:   External Document

## 2010-08-03 ENCOUNTER — Telehealth (INDEPENDENT_AMBULATORY_CARE_PROVIDER_SITE_OTHER): Payer: Self-pay | Admitting: *Deleted

## 2010-08-12 NOTE — Progress Notes (Signed)
Summary: has questions regarding meds > ok to decreased zoloft to QOD  Phone Note Call from Patient Call back at Home Phone [phone]   Caller: Patient Call For: nadel Summary of Call: patient phoned regarding some of the medications that he is taking he has some questions. He can be reached at [phone] Initial call taken by: Vedia Coffer,  August 03, 2010 9:13 AM  Follow-up for Phone Call        Called, spoke with pt.  states he does not think he needs to be on Sertraline anymore.  Would like to try to come off of this and see how he does.  Currently taking 25mg  once daily.  Would like to know if he can come off this at one time or if he needs to gradually come off of it.    Also, pt would like to know if there are any prescription medications that he can safetly come off of.    Dr. Kriste Basque, pls advise.    **Pt requesting to leave detailed message if no answer when we call back. Follow-up by: Gweneth Dimitri RN,  August 03, 2010 10:43 AM  Additional Follow-up for Phone Call Additional follow up Details #1::        per SN-----can cut back to every other day for several weeks then stop.  thanks Randell Loop CMA  August 03, 2010 11:31 AM   called spoke with patient, advised of SN's recs as stated above.  pt verbalized his understanding and will call a few weeks after stopping the sertraline to let us know how he's doing.  pt okay with this.  med list updated. Additional Follow-up by: Boone Master CNA/MA,  August 03, 2010 11:36 AM    New/Updated Medications: ZOLOFT 25 MG TABS (SERTRALINE HCL) Take one tablet by mouth every other day x3-4weeks, then stop

## 2010-09-02 ENCOUNTER — Telehealth: Payer: Self-pay | Admitting: Emergency Medicine

## 2010-09-02 NOTE — Telephone Encounter (Signed)
He is low risk for surgery. We can clear him.

## 2010-09-02 NOTE — Telephone Encounter (Signed)
Pt is having a knee scope done @ [facility]. They are wanting to know if pt is cleared for surgery?

## 2010-09-02 NOTE — Telephone Encounter (Signed)
Faxed over clearance to [facility]

## 2010-09-05 HISTORY — PX: KNEE SURGERY: SHX244

## 2010-09-21 LAB — POCT I-STAT 4, (NA,K, GLUC, HGB,HCT)
Glucose, Bld: 109 mg/dL — ABNORMAL HIGH (ref 70–99)
HCT: 43 % (ref 39.0–52.0)
Hemoglobin: 14.6 g/dL (ref 13.0–17.0)
Potassium: 3.8 mEq/L (ref 3.5–5.1)
Sodium: 142 mEq/L (ref 135–145)

## 2010-09-21 LAB — GLUCOSE, CAPILLARY: Glucose-Capillary: 124 mg/dL — ABNORMAL HIGH (ref 70–99)

## 2010-10-01 ENCOUNTER — Encounter: Payer: Self-pay | Admitting: Pulmonary Disease

## 2010-10-04 ENCOUNTER — Encounter: Payer: Self-pay | Admitting: Pulmonary Disease

## 2010-10-04 ENCOUNTER — Ambulatory Visit (INDEPENDENT_AMBULATORY_CARE_PROVIDER_SITE_OTHER): Payer: BC Managed Care – PPO | Admitting: Pulmonary Disease

## 2010-10-04 ENCOUNTER — Other Ambulatory Visit (INDEPENDENT_AMBULATORY_CARE_PROVIDER_SITE_OTHER): Payer: BC Managed Care – PPO

## 2010-10-04 DIAGNOSIS — F341 Dysthymic disorder: Secondary | ICD-10-CM

## 2010-10-04 DIAGNOSIS — E291 Testicular hypofunction: Secondary | ICD-10-CM

## 2010-10-04 DIAGNOSIS — I10 Essential (primary) hypertension: Secondary | ICD-10-CM

## 2010-10-04 DIAGNOSIS — J45909 Unspecified asthma, uncomplicated: Secondary | ICD-10-CM

## 2010-10-04 DIAGNOSIS — G4733 Obstructive sleep apnea (adult) (pediatric): Secondary | ICD-10-CM

## 2010-10-04 DIAGNOSIS — N139 Obstructive and reflux uropathy, unspecified: Secondary | ICD-10-CM

## 2010-10-04 DIAGNOSIS — E119 Type 2 diabetes mellitus without complications: Secondary | ICD-10-CM

## 2010-10-04 DIAGNOSIS — E785 Hyperlipidemia, unspecified: Secondary | ICD-10-CM

## 2010-10-04 DIAGNOSIS — N320 Bladder-neck obstruction: Secondary | ICD-10-CM | POA: Insufficient documentation

## 2010-10-04 LAB — LIPID PANEL
HDL: 39.3 mg/dL (ref >39.00)
Total CHOL/HDL Ratio: 4

## 2010-10-04 LAB — HEPATIC FUNCTION PANEL
AST: 22 U/L (ref 0–37)
Albumin: 4.1 g/dL (ref 3.5–5.2)
Alkaline Phosphatase: 62 U/L (ref 39–117)
Bilirubin, Direct: 0.1 mg/dL (ref 0.0–0.3)
Total Protein: 7.3 g/dL (ref 6.0–8.3)

## 2010-10-04 LAB — CBC WITH DIFFERENTIAL/PLATELET
Basophils Absolute: 0 10*3/uL (ref 0.0–0.1)
Eosinophils Absolute: 0.3 10*3/uL (ref 0.0–0.7)
HCT: 42.3 % (ref 39.0–52.0)
Hemoglobin: 14.6 g/dL (ref 13.0–17.0)
Lymphs Abs: 2.4 10*3/uL (ref 0.7–4.0)
MCHC: 34.4 g/dL (ref 30.0–36.0)
Monocytes Relative: 6.7 % (ref 3.0–12.0)
Neutro Abs: 6.2 10*3/uL (ref 1.4–7.7)
Platelets: 267 10*3/uL (ref 150.0–400.0)
RDW: 13.5 % (ref 11.5–14.6)

## 2010-10-04 LAB — BASIC METABOLIC PANEL
CO2: 27 mEq/L (ref 19–32)
Calcium: 9.3 mg/dL (ref 8.4–10.5)
GFR: 88.38 mL/min (ref >60.00)
Glucose, Bld: 91 mg/dL (ref 70–99)
Potassium: 4.2 mEq/L (ref 3.5–5.1)
Sodium: 138 mEq/L (ref 135–145)

## 2010-10-04 LAB — TSH: TSH: 2.16 u[IU]/mL (ref 0.35–5.50)

## 2010-10-04 MED ORDER — HYDROCODONE-ACETAMINOPHEN 5-500 MG PO TABS: ORAL_TABLET | ORAL | Status: DC

## 2010-10-04 MED ORDER — ALPRAZOLAM 0.5 MG PO TABS: 0.5000 mg | ORAL_TABLET | Freq: Three times a day (TID) | ORAL | Status: DC | PRN

## 2010-10-04 NOTE — Progress Notes (Signed)
Subjective:    Patient ID: Dakota Ellison, male    DOB: [birth year], 60 y.o.   MRN: [id number]  HPI 60 y/o WM here for an add-on visit... he has multiple medical problems as noted below...    ~  Oct 05, 2009:  his head is now shaved, works for [employer], "leaves nothing to grab"...    He saw DrGollan at [facility] for f/u HBP, Hyperlipid, Obesity & OSA- BP controlled on med, due for f/u FLP on his 3 meds, still sees [facility] who wrote for his CPAP...   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 I refused this & explained why> rec diet + exercise...   Saw DrDahlstadt 4/11 for his Hypogonadism- he had Testopel Implants but 1 pellet site got infected & 2 pellets popped out... Rx by Urology, they plan f/u Testosterone level.  ~  April 05, 2010:  he's had a good 50mo but unfortunately has gained 15# up to 314# & we discussed renewed diet + exercise efforts for weight reduction... BP controlled on Exforge;  denies CP, palpit, ch in DOE, etc;  FLP not quite as good w/ the wt gain & reminded to take the Simva4,0 Niacin, & FishOil daily... requests refill Vocodin & Rebeca Allegra today;  already had the 2010 flu vaccine...  He had f/u DrDahlstadt 8/11 w/ 12 Testopel implants placed & due for f/u Testosterone level next month...   ~  October 04, 2010:  50mo ROV & doing satis> just had left knee arthroscopic surg by DrMurphy & already feeling better;  He saw [facility] 10/11 for check up> doing well, no changes made, f/u 50yr...  Hasn't been able to lose wt & we reviewed diet + exercise prescription...  See fasting labs below...  Wants refills Xanax & Vicodin (lim to 3/d max).         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 it got worse and was sent to DrWolicki who hospitalized him 3/08 w/ MRSA infection- Rx's  w/ Vancomycin infusions... now resolved.  OBSTRUCTIVE SLEEP APNEA (ICD-327.23) - Apr10> had Sleep Study at [facility]> pos for OSA w/ AHI= 82, desat to 85%... split night study w/ good control on CPAP 20 (see report)... started on CPAP by [facility] & still followed by him...  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 no recent exac and no regular meds... told to discontinue all smoking> denies much cough, sputum, dyspnea, CP, etc...  HYPERTENSION (ICD-401.9) - followed by DrGollan/ [facility]> on ECASA 325mg /d & EXFORGE 10/320/25 daily + KCl 11mEq/d... BP today = 130/70 doing well... he had CP while hosp w/ the MRSA 2008 and DrWolicki consulted DrMcQueen who did a NuclearStressTest 3/08 which was normal... and a 2DEcho which showed mild MR and nl LVF... ~ 10/11:  f/u visit DrGollan at [facility]> note reviewed, no changes made.  HYPERLIPIDEMIA (ICD-272.4) - he's been switched around by [facility] betw Simva/ Caduet/ back to SIMVASTATIN 40mg /d now + NIACIN 500mg Bid + FISH OIL 1000mg Bid...  still not on any kind of diet but his weight is stable at 294#.Marland Kitchen. ~  FLP 1/08 showed TChol 195, TG 119, HDL 41, LDL 131 ~  FLP 2/09 showed TChol 214, TG 214, HDL 39, LDL 127 ~  12/09:  recent labs by [facility]- we don't have these results... ~  FLP 5/10 on 3 meds above showed TChol 148, TG 81, HDL 48, LDL 84 ~  FLP 11/10 on 3 meds showed TChol 135, TG 145, HDL 36, LDL 70 ~  FLP 5/11 showed TChol 145, TG 128, HDL 43, LDL 77... keep same Rx. ~  FLP 10/11 showed TChol 152, TG 198, HDL 41, LDL 72... same meds, better diet, get wt down! ~  FLP 4/12 showed TChol 141, TG 200, HDL 39, LDL 62... Similar> needs better low fat diet, get wt down.  MORBID OBESITY (ICD-278.01) - despite all efforts weight is up to 2314# now... discussed wt watchers, nutrition center, poss bariatric approach... he's not been able to exercise but will join the Y & start diet program! ~  5/11:  he reports that DrElkins gave him 30d  supply of Phenteramine which really helped- lost 20#, but put it all back + 9# more off this med... we discussed the dangers of diet pills & rec diet + exercise, neither of which he has tried in earnest! ~  10/11:  weight up 15# to 314# today> discussed diet, exercise, etc... ~  4/12: weight = 309# and needs to do better w/ diet/ exercise...  GERD (ICD-530.81) - he had an EGD from [facility] 4/03 which was neg... he takes OMEPRAZOLE 20mg /d for reflux symptoms which works well...  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 prev tried Diltiazem cream etc... ~  pt reports colonoscopy by [facility] 7/03- reported neg & f/u planned 5 yrs.  TESTOSTERONE DEFICIENCY (ICD-257.2) &  ERECTILE DYSFUNCTION (ICD-302.72) - prev on TESTIM Gel 1%= 5gm daily and followed by DrDahlstadt> changed to Testopel Implants... also takes Viagra Prn...  ~  labs 5/10 showed PSA = 0.85 ~  labs 5/11 showed PSA= 0.65 ~  Labs 4/12 showed PSA= 0.79  OSTEOARTHRITIS (ICD-715.90) DEGENERATIVE DISC DISEASE (ICD-722.6) - followed by DrMurphy for right knee pain... prev seen by Mayford Knife for DDD and LBP... ~  s/p left CTS surg 2009 ~  s/p right knee arthroscopy by DrMurphy 2/10... also had cortisone shot in knee 4/10 ~  s/p trigger finger injections by DrGramig ~  4/12:  s/p left knee arthroscopy by DrMurphy> improved...  DYSTHYMIA (ICD-300.4) - some stress, some depression which is mild by his determination... he tried Lexapro last year but didn't follow up... now using Xanax as needed, and started ZOLOFT 25mg /d in Feb10- he reports better, wants to continue this dose.   Past Surgical History  Procedure Date  . Sphincterotomy   . Shoulder arthroscopy     right  . Other surgical history     arthroscopic subacromial decompression  . Other surgical history     open resection, distal right clavicle   =>  s/p left knee arthroscopy 4/12 by DrMurphy    Outpatient Encounter Prescriptions  as of 10/04/2010  Medication Sig Dispense Refill  . ALPRAZolam (XANAX) 0.5 MG tablet Take 0.5 mg by mouth 3 (three) times daily as needed. For nerves       . Amlodipine-Valsartan-HCTZ (EXFORGE HCT) 10-320-25 MG TABS Take 1 tablet by mouth daily.        Marland Kitchen Apple Cider Vinegar 500 MG TABS Take by mouth. 4 tsp       . aspirin 81 MG tablet Take 81 mg by mouth daily.        . Chromium Picolinate 200 MCG TABS Take 1 tablet by mouth daily.        Marland Kitchen  Cinnamon 500 MG capsule Take 500 mg by mouth daily.        Marland Kitchen HYDROcodone-acetaminophen (VICODIN) 5-500 MG per tablet Take one tablet by mouth every 6-8 hours as needed NOT TO EXCEED 3 PER DAY       . hydrocortisone (PROCTOSOL HC) 2.5 % rectal cream Apply as directed after each BM       . Multiple Vitamin (MULTIVITAMIN) tablet Take 1 tablet by mouth daily.        . niacin 500 MG CR capsule Take 500 mg by mouth daily.        . Omega-3 Fatty Acids (FISH OIL TRIPLE STRENGTH) 1400 MG CAPS Take 1 capsule by mouth 2 (two) times daily.        Marland Kitchen omeprazole (PRILOSEC) 20 MG capsule Take 20 mg by mouth daily.        . Potassium Gluconate 550 MG TABS Take 1 tablet by mouth daily.        . sertraline (ZOLOFT) 25 MG tablet Take one tablet by mouth every other day x 3-4 weeks, then stop       . sildenafil (VIAGRA) 100 MG tablet Use as directed       . simvastatin (ZOCOR) 40 MG tablet Take 40 mg by mouth at bedtime.          No Known Allergies   Review of Systems         See HPI - all other systems neg except as noted... The patient complains of dyspnea on exertion.  The patient denies anorexia, fever, weight loss, weight gain, vision loss, decreased hearing, hoarseness, chest pain, syncope, peripheral edema, prolonged cough, headaches, hemoptysis, abdominal pain, melena, hematochezia, severe indigestion/heartburn, hematuria, incontinence, muscle weakness, suspicious skin lesions, transient blindness, difficulty walking, depression, unusual weight change, abnormal  bleeding, enlarged lymph nodes, and angioedema.     Objective:   Physical Exam     WD, Obese, 60 y/o WM in NAD... GENERAL:  Alert & oriented; pleasant & cooperative. HEENT:  [place]/AT, EOM-wnl, PERRLA, EACs-clear, TMs-wnl, NOSE-clear, THROAT-clear & wnl. NECK:  Supple w/ fairROM; no JVD; normal carotid impulses w/o bruits; no thyromegaly or nodules palpated; no lymphadenopathy. CHEST:  Clear to P & A; without wheezes/ rales/ or rhonchi. HEART:  Regular Rhythm; gr 1/6 SEM, without rubs or gallops... ABDOMEN:  Obese soft & nontender; normal bowel sounds; no organomegaly or masses detected.  EXT: without deformities, mild arthritic changes; no varicose veins/ +venous insuffic/ no edema.right knee w/ ace wrap.  NEURO:  CN's intact; no focal neuro deficits... DERM:  Testopel Implants palpated in his right lat butt cheek area, no infection or drainage seen.   Assessment & Plan:   OSA>  On CPAP per drKelly & pt informs me he has f/u appt soon...  Asthma>  Still smoking cigars & needs to quit, we discussed smoking cessation but he doesn't want med help etc...  HBP>  Controlled on the exforgwe Rx, he likes the one pill;  discussed the need to lose wt, no salt, etc...  Hyperlipidemia>  FLP similar to prev on similar meds w/ similar wt> if he wants to improve these numbers MUST get on diet & get wt down!!!  OBESITY>  As above!!!  GU>  Per DrDahlstedt on Testopel implants  DJD>  S/p arthroscopic surg left knee by DrMurphy & feeling better already.Marland KitchenMarland Kitchen

## 2010-10-04 NOTE — Patient Instructions (Signed)
Today we updated your med list in our new EPIC system...    We refilled your Xanax & Vicodin per request...  Today we did your follow up fasting blood work...    Please call the PHONE TREE in a few days for your results...    Dial [id number] & when prompted enter your patient number followed by the # symbol...    Your patient number is:  [id number]#  Let's get on track w/ our diet & exerice program!!!     The goal is to lose 15-20 lbs...  Call for any problems... Let's plan a follow up visit in 6 months.Marland KitchenMarland Kitchen

## 2010-10-05 ENCOUNTER — Encounter: Payer: Self-pay | Admitting: Pulmonary Disease

## 2010-10-12 ENCOUNTER — Other Ambulatory Visit: Payer: Self-pay | Admitting: Pulmonary Disease

## 2010-10-12 ENCOUNTER — Other Ambulatory Visit: Payer: Self-pay | Admitting: Adult Health

## 2010-10-19 NOTE — Procedures (Signed)
NAME:  Dakota Ellison, Dakota Ellison NO.:  [id number]   MEDICAL RECORD NO.:  [id number]          PATIENT TYPE:  OUT   LOCATION:  SLEEP CENTER                 FACILITY:  [facility]   PHYSICIAN:  Clinton D. Maple Hudson, MD, FCCP, FACPDATE OF BIRTH:  03[birth year]02[birth year]52   DATE OF STUDY:  09/24/2008                            NOCTURNAL POLYSOMNOGRAM   REFERRING PHYSICIAN:  Antonieta Iba, MD   INDICATION FOR STUDY:  Hypersomnia with sleep apnea.  Epworth sleepiness  score 10/24, BMI 37.3, weight 275 pounds, height 72 inches, and neck  18.5 inches.  Home medication charted and reviewed.   SLEEP ARCHITECTURE:  Split study protocol.  During the diagnostic phase,  a total sleep time 120 minutes with sleep efficiency 59.1%.  Stage I was  3.8%, stage II 94.6%, stage III absent, and REM 1.7% of total sleep  time.  Sleep latency 53 minutes, REM latency 137 minutes, awake after  sleep onset 13 minutes, arousal index 49.5 indicating increased EEG  arousal.  No bedtime medication was taken.   RESPIRATORY DATA:  Split study protocol.  Apnea/hypopnea index (AHI) 82  per hour.  A total of 164 events was scored including 128 obstructive  apneas and 36 hypopneas.  Events were all listed as nonsupine.  CPAP was  then titrated to 20 CWP, AHI 0 per hour.  He wore a medium ResMed Mirage  Quattro mask with heated humidifier.   OXYGEN DATA:  Moderately loud snoring before CPAP with oxygen  desaturation to a nadir of 85%.  After CPAP control, Mean oxygen  saturation held 95.3% on room air.   CARDIAC DATA:  Normal sinus rhythm.   MOVEMENT/PARASOMNIA:  No significant movement disturbance.  Bathroom x2.  Nonspecific limb jerks noted during CPAP titration are common and  usually clinically insignificant finding unless history indicates that  there is significant movement disturbance at home, especially after  adaptation to CPAP.   IMPRESSION/RECOMMENDATIONS:  1. Severe obstructive sleep apnea/hypopnea syndrome, AHI 82  per hour.      Moderately loud snoring with oxygen desaturation to a nadir of 85%.  2. Successful continuous positive airway pressure titration to 20 CWP,      AHI 0 per hour.  He wore a medium ResMed Mirage Quattro mask with      heated humidifier.     Clinton D. Maple Hudson, MD, [facility], FACP  Diplomate, Biomedical engineer of Sleep Medicine  Electronically Signed    CDY/MEDQ  D:  09/27/2008 12:00:28  T:  09/27/2008 22:32:38  Job:  [id number]

## 2010-10-19 NOTE — Assessment & Plan Note (Signed)
[facility]                             PULMONARY OFFICE NOTE   NAME:Dakota Ellison, Dakota Ellison                        MRN:          [id number]  DATE:04/16/2007                            DOB:          Jun 19, 1950    HISTORY OF PRESENT ILLNESS:  The patient is a 60 year old white male  patient of Dr. Jodelle Green who has a known history of mild asthma,  gastroesophageal reflux disease, and morbid obesity, and osteoarthritis,  who presents today for an acute office visit.  The patient complains of  a 4-day history of a sore throat, post-nasal drip, nasal congestion.  The patient denies any purulent sputum, fever, chest pain, orthopnea,  PND, nausea or vomiting, neck pain, or recent travel or antibiotic use.   PAST MEDICAL HISTORY:  Reviewed.   CURRENT MEDICATIONS:  Reviewed.   PHYSICAL EXAM:  The patient is a pleasant obese male in no acute  distress.  He is afebrile with stable vital signs.  O2 saturation is 96% on room  air.  HEENT:  Nasal mucosa is pale.  Nontender sinuses.  Conjunctivae not  injected.  Posterior pharynx is clear with some mild redness.  No  exudate is noted.  TMs normal.  NECK:  Supple without cervical adenopathy.  No JVD.  Negative nuchal  rigidity.  LUNGS:  Sounds are clear without any wheezing or crackles.  CARDIAC:  Regular rate.  ABDOMEN:  Morbidly obese, soft, and nontender.  No palpable  hepatosplenomegaly.  No guarding or rebound noted.  EXTREMITIES:  Warm without any calf tenderness, cyanosis, clubbing, or  edema.   IMPRESSION AND PLAN:  Acute pharyngitis and rhinitis flare.  The patient  is to begin Mucinex twice daily.  Salt water gargles.  May use Magic  Mouthwash as needed for sore throat up to 4 times a day.  Strep test is  pending at time of dictation.  Will follow up accordingly.  The patient  may use saline and Afrin nasal spray x5 days, instruction sheet given.  The patient is to return back in 1 to 2 months for a complete  physical  exam with Dr. Kriste Basque or sooner if needed.      Rubye Oaks, NP  Electronically Signed      Lonzo Cloud. Kriste Basque, MD  Electronically Signed   TP/MedQ  DD: 04/16/2007  DT: 04/16/2007  Job #: [id number]

## 2010-10-19 NOTE — Op Note (Signed)
NAME:  Dakota Ellison, Dakota Ellison NO.:  [id number]   MEDICAL RECORD NO.:  [id number]          PATIENT TYPE:  AMB   LOCATION:  [facility]                         FACILITY:  [facility]   PHYSICIAN:  Anselm Pancoast. Weatherly, M.D.DATE OF BIRTH:  Apr 10, 1951   DATE OF PROCEDURE:  07/30/2008  DATE OF DISCHARGE:                               OPERATIVE REPORT   PREOPERATIVE DIAGNOSIS:  Recurrent posterior anal fissure.   POSTOPERATIVE DIAGNOSES:  1. Recurrent posterior anal fissure.  2. Second-degree internal hemorrhoids.   OPERATION:  Internal sphincterotomy.  General anesthesia.   HISTORY:  Dakota Ellison is a 60 year old male who was first seen in our  office by Dr. Freida Busman approximately nearly a year ago with a lot of anal  spasm and very minimal mild hemorrhoids.  He is a large individual, [employer]  truck driver.  I first saw him about 2 weeks later, and at that time, he  was significantly better.  She had treated him with pain medication, and  I think __________, and you could see a little fissure posteriorly.  He  since then has been managed with __________ and Xylocaine ointment and  will go about 2 or 3 months with not much problems and then will have an  episode of pain for a week or so.  I saw him back in the office 2 times  when he was doing reasonably well, but then I saw him back in January at  which time he informed me that he was going to have knee surgery by Dr.  Eulah Pont and whether or not he would benefit from getting an internal  sphincterotomy.  I recommended that we not do the procedure combined and  that since he was going to be off of work about 2 months or so  afterwards that if he was still having symptoms we could proceed with  the internal sphincterotomy at a separate time.  He returned, and I saw  him on February 10, said he was doing fine, but he was still having  these kind of intermittent anal spasms and etc.  I have never been  appreciated of any significant external  hemorrhoids on anoscopic.  Of  course with his size, they do very commonly do have internal hemorrhoid  enlargements.  He is here for the planned procedure, and we plan on  internal examination under anesthesia and then proceed with an internal  sphincterotomy.  The patient preoperatively was given a gram of Ancef.  He had taken 2 bottles of mag citrate yesterday and had a bowel movement  before going back, and then we introduced general anesthesia with LOA  tube and put him up in the yellow-fin stirrups.  First, with the  endoscopic exam, you could see the little fissure.  He has got just a  little bit of irritated hemorrhoid to the right side of the little  fissure, and then on anoscopic exam, he has got moderate, I would say 1  to 2 second-degree internal hemorrhoids.  Of course, with his size, that  is not unusual.  You can see the sphincter  where it tightens when they  were using the general anesthesia, and I elected to make a little  incision to the left about probably 3/4 of an inch from the actual  posterior area and then elevated the sphincter over a hemostat and then  divided it with cautery.  I had checked to make sure that the internal  sphincter was divided and divided a few fibers just a little bit higher  with the cautery, and then there was not any significant bleeding, and  then I put 2 chromic sutures of 3-0 to close the little skin incision.  I then anesthetized the sphincter area with about 20 mL of 0.50%  Marcaine with adrenaline and then put a little dressing on the area.  The patient tolerated the procedure nicely  and will be released after a short stay and will see [place] in the office in  approximately 2 weeks.  He will need to regulate his stools to have soft  bowel movements but not diarrhea and hopefully will be ready to return  to work as soon as his knee surgery has been no longer in the pain and  discomfort period.           ______________________________   Anselm Pancoast. Zachery Dakins, M.D.     WJW/MEDQ  D:  07/30/2008  T:  07/30/2008  Job:  [id number]   cc:   Anselm Pancoast. Zachery Dakins, M.D.  [address], [address]  [place]  [place] [address]

## 2010-10-22 NOTE — Consult Note (Signed)
NAMEDEPAUL, ARIZPE NO.:  [id number]   MEDICAL RECORD NO.:  [id number]          PATIENT TYPE:  EMS   LOCATION:  [facility]                         FACILITY:  [facility]   PHYSICIAN:  Karol T. Lazarus Salines, M.D. DATE OF BIRTH:  1951/02/05   DATE OF CONSULTATION:  07/22/2006  DATE OF DISCHARGE:                                 CONSULTATION   CHIEF COMPLAINT:  MRSA infection of the lower lip.   HISTORY:  A 60 year old white male has had a progressively swollen lower  lip over the past week.  I performed an incision and drainage 2 days  ago.  He has been on Bactrim and we added clindamycin.  He continues to  feel flushed and febrile but not documented.  He is here for initiation  of intravenous vancomycin therapy for proven MRSA.  The sensitivities  were performed this week and reveal sensitivities to Bactrim,  doxycycline, and gentamicin.  Clindamycin was not tested.  He has been  on both Bactrim and clindamycin.   PHYSICAL EXAMINATION:  GENERAL:  He does not look distressed.  HEENT:  The lower lip is desquamating with a central eschar.  It is  slightly smaller than it was yesterday.  There is no surrounding  cellulitis of the skin of the face.   IMPRESSION:  Methicillin-resistant Staphylococcus aureus infection of  the lower lip, failing to respond on oral antibiotics.   PLAN:  We have made arrangements for him to begin intravenous vancomycin  in the emergency room and then to return once daily for dosing without  actually being admitted to the hospital.  I discussed this with him  carefully and he understands and agrees.  I will see him back in my  office in 3-4 days.  He will continue Bactroban ointment to the lip,  Bactroban ointment the nostrils once daily, and he will discontinue  clindamycin and Bactrim at the present time.      Gloris Manchester. Lazarus Salines, M.D.  Electronically Signed     KTW/MEDQ  D:  07/22/2006  T:  07/22/2006  Job:  [id number]   cc:   Windle Guard,  M.D.

## 2010-10-22 NOTE — Op Note (Signed)
[facility]  Patient:    Dakota Ellison, Dakota Ellison                          MRN: [id number] Proc. Date: 08/28/00 Attending:  Fayrene Fearing P. Aplington, M.D.                           Operative Report  PREOPERATIVE DIAGNOSES: 1. Chronic impingement syndrome, with partial underneath surface    rotator cuff tear. 2. Painful hypertrophic arthritis, acromioclavicular joint and right    shoulder.  POSTOPERATIVE DIAGNOSES: 1. Chronic impingement syndrome, with partial underneath surface    rotator cuff tear. 2. Painful hypertrophic arthritis, acromioclavicular joint and right    shoulder.  OPERATIONS: 1. Right shoulder arthroscopy with:    a. Shaving of underneath surface of rotator cuff.    b. Minimal debridement of long end biceps tendon and glenoid       labrum. 2. Arthroscopic subacromial decompression. 3. Open resection, distal right clavicle.  SURGEON:  Illene Labrador. Aplington, M.D.  ASSISTANT:  Dorie Rank, P.A.  ANESTHESIA:  General.  INDICATIONS FOR PROCEDURE:  Chronic pain, right shoulder; with MRI demonstrating partial underneath surface of rotator cuff tear, which was confirmed at surgery.  Plain films show large subacromial spur.  He also has significant tenderness and hypertrophic arthritis at the [facility] joint.  DESCRIPTION OF PROCEDURE:  Satisfactory general anesthesia, beach-chair position on the Schlein frame; the right shoulder girdle was prepped with Duraprep and draped in a sterile field, and the shoulder was marked out. Posterior soft spot and lateral portals were infiltrated with 0.5% Marcaine with adrenaline, as was the subacromial bursa and the proposed incision for the distal clavicle resection.  Through a posterior soft spot portal, I atraumatically entered the glenohumeral joint and got a good look at the joint.  There was a little fraying of the most proximal portion of the long end of the biceps tendon, some minimal fraying of the glenoid labrum,  and the partial tear of the underneath surface of the rotator cuff near its attachment (as depicted from the MRI).  I advanced the scope to the anterior capsule between the long end of the biceps tendon and subscapularis.  I used a switching stick and made a portal anteriorly, slipped over this cannula for the 4.2 shaver, which I then introduced and shaved down the long end of the biceps tendon, the glenoid labrum and the underneath surface of the rotator cuff (with pictures pre and post being taken).  I then evacuated fluid from the joint and redirected the scope into the subacromial area.  Through a lateral portal I introduced the 4.2 shaver and performed partial bursectomy for visualization, and removed some of the soft tissue from the underneath surface of the acromion.  I then used the ArthroCare tissue ablator to remove the remaining soft tissue, and cauterize and debride the coracoacromial ligament and soft tissues from around the anterior acromion.  I then went back and forth between the 4.0 bur (which I introduced) and began shaving down the underneath surface of the acromion, 4.2 shaver and the ablator until I had removed all bone and soft tissue which could be an impingement factor.  I took an initial picture showing the impingement, and then final pictures with the arm to the side and the arm abducted -- showing the wide clearance.  After I was satisfied that the impingement was taken care of, I  evacuated this joint of fluid as well.  I then made an open incision over the distal clavicle. With cutting cautery I opened the fascia over the distal acromion and identified the [facility] joint.  I then marked out 2 cm medial to it, and made this as my line for osteotomy of the distal clavicle.  I undermined the clavicle anteriorly and posteriorly, and protected the underlying structures.  I used a micro saw to make my cut.  I then removed the distal clavicle remnant, which had also a large  prominence on the underneath surface and was adherent to the rotator cuff, with sharp dissection.  I then removed small spicules of bone from the remaining portion of the clavicle, applied bone wax.  Then, after irrigating this space well with sterile saline, I filled it with Gelfoam.  The fascia over the defect was closed with interrupted #1 Vicryl, subcutaneous tissue with 2-0 Vicryl and the portals and the incision with staples. Betadine Adaptic and dry sterile dressing were applied.  His arm was placed in the large shoulder immobilizer.  He tolerated the procedure well and was taken to the recovery room in satisfactory condition with no known complications. DD:  08/28/00 TD:  08/29/00 Job: [id number]

## 2011-02-03 ENCOUNTER — Other Ambulatory Visit: Payer: Self-pay | Admitting: Pulmonary Disease

## 2011-03-14 ENCOUNTER — Encounter: Payer: Self-pay | Admitting: Cardiovascular Disease

## 2011-03-14 ENCOUNTER — Ambulatory Visit (INDEPENDENT_AMBULATORY_CARE_PROVIDER_SITE_OTHER): Payer: BC Managed Care – PPO | Admitting: Cardiovascular Disease

## 2011-03-14 DIAGNOSIS — G4733 Obstructive sleep apnea (adult) (pediatric): Secondary | ICD-10-CM

## 2011-03-14 DIAGNOSIS — I10 Essential (primary) hypertension: Secondary | ICD-10-CM

## 2011-03-14 DIAGNOSIS — E785 Hyperlipidemia, unspecified: Secondary | ICD-10-CM

## 2011-03-14 NOTE — Assessment & Plan Note (Signed)
He continues to wear his CPAP. Weight continues to be a problem.

## 2011-03-14 NOTE — Assessment & Plan Note (Signed)
We have encouraged continued exercise, careful diet management in an effort to lose weight. 

## 2011-03-14 NOTE — Assessment & Plan Note (Signed)
Blood pressure is well controlled on today's visit. No changes made to the medications. We did discuss his medications with him and suggested he talk with his pharmacist about changing his medications to generic HCTZ, amlodipine and either Dilantin or losartan. He will check the cost and call our office back

## 2011-03-14 NOTE — Assessment & Plan Note (Signed)
Cholesterol is at goal on the current lipid regimen. No changes to the medications were made.  

## 2011-03-14 NOTE — Patient Instructions (Addendum)
You are doing well. No medication changes were made. Consider changing exforge to: HCTZ, diovan (losartan), amlodipine Please call us if you have new issues that need to be addressed before your next appt.  We will call you for a follow up Appt. In 12 months

## 2011-03-14 NOTE — Progress Notes (Signed)
Patient ID: Dakota Ellison, male    DOB: 08/03/1950, 60 y.o.   MRN: [id number]  HPI Comments: [name] is a pleasant 60 year old gentleman with a history of obesity, obstructive sleep apnea, hypertension, hyperlipidemia who presents for routine followup.   He continues to struggle with his weight. He has an addiction to junk food. He stopped doing any significant exercise. He knows that he needs to lose weight but he has not been particularly motivated.   He denies any other symptoms of shortness of breath or chest discomfort.   He is able to tolerate his CPAP.   Last echocardiogram in March 2008 showing normal systolic function, right ventricle mildly dilated, mild mitral regurg, mild tricuspid regurg.   Last stress test in March 2008 where he achieved 10 mets, exercised for 9 minutes, no ischemia noted. Overall a normal,  low-risk scan.   EKG shows normal sinus rhythm with rate 76 beats per minute, no significant ST-T wave changes    Outpatient Encounter Prescriptions as of 03/14/2011  Medication Sig Dispense Refill  . ALPRAZolam (XANAX) 0.5 MG tablet Take 1 tablet (0.5 mg total) by mouth 3 (three) times daily as needed. For nerves  90 tablet  5  . Apple Cider Vinegar 500 MG TABS Take by mouth. 4 tsp       . aspirin 81 MG tablet Take 81 mg by mouth daily.        . Chromium Picolinate 200 MCG TABS Take 1 tablet by mouth daily.        . Cinnamon 500 MG capsule Take 500 mg by mouth daily.        Marland Kitchen EXFORGE HCT 10-320-25 MG TABS TAKE 1 TABLET BY MOUTH ONCE A DAY  90 tablet  4  . HYDROcodone-acetaminophen (VICODIN) 5-500 MG per tablet Take one tablet by mouth every 6-8 hours as needed NOT TO EXCEED 3 PER DAY  90 tablet  5  . hydrocortisone (PROCTOSOL HC) 2.5 % rectal cream Apply as directed after each BM       . Multiple Vitamin (MULTIVITAMIN) tablet Take 1 tablet by mouth daily.        . niacin 500 MG CR capsule Take 500 mg by mouth daily.        . Omega-3 Fatty Acids (FISH OIL TRIPLE  STRENGTH) 1400 MG CAPS Take 1 capsule by mouth 2 (two) times daily.        Marland Kitchen omeprazole (PRILOSEC) 20 MG capsule TAKE 1 CAPSULE BY MOUTH ONCE A DAY  90 capsule  4  . Potassium Gluconate 550 MG TABS Take 1 tablet by mouth daily.        . sertraline (ZOLOFT) 25 MG tablet TAKE 1 TABLET BY MOUTH EVERY DAY  90 tablet  2  . simvastatin (ZOCOR) 40 MG tablet TAKE 1 TABLET BY MOUTH AT BEDTIME  90 tablet  4  . VIAGRA 100 MG tablet USE AS DIRECTED  10 tablet  5     Review of Systems  Constitutional: Negative.   HENT: Negative.   Eyes: Negative.   Respiratory: Negative.   Cardiovascular: Negative.   Gastrointestinal: Negative.   Musculoskeletal: Negative.   Skin: Negative.   Neurological: Negative.   Hematological: Negative.   Psychiatric/Behavioral: Negative.   All other systems reviewed and are negative.    BP 111/66  Pulse 78  Ht 6' (1.829 m)  Wt 317 lb (143.79 kg)  BMI 42.99 kg/m2   Physical Exam  Nursing note and vitals reviewed.  Constitutional: He is oriented to person, place, and time. He appears well-developed and well-nourished.       obese  HENT:  Head: Normocephalic.  Nose: Nose normal.  Mouth/Throat: Oropharynx is clear and moist.  Eyes: Conjunctivae are normal. Pupils are equal, round, and reactive to light.  Neck: Normal range of motion. Neck supple. No JVD present.  Cardiovascular: Normal rate, regular rhythm, S1 normal, S2 normal, normal heart sounds and intact distal pulses.  Exam reveals no gallop and no friction rub.   No murmur heard. Pulmonary/Chest: Effort normal and breath sounds normal. No respiratory distress. He has no wheezes. He has no rales. He exhibits no tenderness.  Abdominal: Soft. Bowel sounds are normal. He exhibits no distension. There is no tenderness.  Musculoskeletal: Normal range of motion. He exhibits no edema and no tenderness.  Lymphadenopathy:    He has no cervical adenopathy.  Neurological: He is alert and oriented to person, place,  and time. Coordination normal.  Skin: Skin is warm and dry. No rash noted. No erythema.  Psychiatric: He has a normal mood and affect. His behavior is normal. Judgment and thought content normal.           Assessment and Plan

## 2011-04-04 ENCOUNTER — Ambulatory Visit (INDEPENDENT_AMBULATORY_CARE_PROVIDER_SITE_OTHER): Payer: BC Managed Care – PPO | Admitting: Pulmonary Disease

## 2011-04-04 ENCOUNTER — Encounter: Payer: Self-pay | Admitting: Pulmonary Disease

## 2011-04-04 DIAGNOSIS — M1990 Unspecified osteoarthritis, unspecified site: Secondary | ICD-10-CM

## 2011-04-04 DIAGNOSIS — G4733 Obstructive sleep apnea (adult) (pediatric): Secondary | ICD-10-CM

## 2011-04-04 DIAGNOSIS — F341 Dysthymic disorder: Secondary | ICD-10-CM

## 2011-04-04 DIAGNOSIS — IMO0002 Reserved for concepts with insufficient information to code with codable children: Secondary | ICD-10-CM

## 2011-04-04 DIAGNOSIS — E291 Testicular hypofunction: Secondary | ICD-10-CM

## 2011-04-04 DIAGNOSIS — J45909 Unspecified asthma, uncomplicated: Secondary | ICD-10-CM

## 2011-04-04 DIAGNOSIS — E785 Hyperlipidemia, unspecified: Secondary | ICD-10-CM

## 2011-04-04 DIAGNOSIS — I10 Essential (primary) hypertension: Secondary | ICD-10-CM

## 2011-04-04 DIAGNOSIS — K219 Gastro-esophageal reflux disease without esophagitis: Secondary | ICD-10-CM

## 2011-04-04 MED ORDER — HYDROCODONE-ACETAMINOPHEN 5-500 MG PO TABS: ORAL_TABLET | ORAL | Status: DC

## 2011-04-04 MED ORDER — ALPRAZOLAM 0.5 MG PO TABS: 0.5000 mg | ORAL_TABLET | Freq: Three times a day (TID) | ORAL | Status: DC | PRN

## 2011-04-04 NOTE — Patient Instructions (Signed)
Today we updated your med list in our EPIC system...    Continue your current medications the same...  Dakota Ellison, we've got to get on track w/ our diet & exercise program...    The goal is to lose 15-20 lbs...  You also need to back off on the cigar smoking!!!  Call for any problems...  Let's plan a follow up visit in 6 months w/ FASTING blood work at that time.Marland KitchenMarland Kitchen

## 2011-04-04 NOTE — Progress Notes (Signed)
Subjective:    Patient ID: Dakota Ellison, male    DOB: 04-03-1951, 60 y.o.   MRN: [id number]  HPI  60 y/o WM here for an add-on visit... he has multiple medical problems as noted below...    ~  Oct 05, 2009:  his head is now shaved, works for [employer], "leaves nothing to grab"...    He saw [facility]ollan at [facility] for f/u HBP, Hyperlipid, Obesity & OSA- BP controlled on med, due for f/u FLP on his 3 meds, still sees [facility] who wrote for his CPAP...   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 I refused this & explained why> rec diet + exercise...   Saw DrDahlstadt 4/11 for his Hypogonadism- he had Testopel Implants but 1 pellet site got infected & 2 pellets popped out... Rx by Urology, they plan f/u Testosterone level.  ~  April 05, 2010:  he's had a good 34mo but unfortunately has gained 15# up to 314# & we discussed renewed diet + exercise efforts for weight reduction... BP controlled on Exforge;  denies CP, palpit, ch in DOE, etc;  FLP not quite as good w/ the wt gain & reminded to take the Simva4,0 Niacin, & FishOil daily... requests refill Vocodin & Rebeca Allegra today;  already had the 2010 flu vaccine...  He had f/u DrDahlstadt 8/11 w/ 12 Testopel implants placed & due for f/u Testosterone level next month...   ~  October 04, 2010:  34mo ROV & doing satis> just had left knee arthroscopic surg by DrMurphy & already feeling better;  He saw [facility] 10/11 for check up> doing well, no changes made, f/u 59yr...  Hasn't been able to lose wt & we reviewed diet + exercise prescription...  See fasting labs below...  Wants refills Xanax & Vicodin (lim to 3/d max).  ~  April 04, 2011:  34mo ROV & he reports doing well, needs Vicodin & Alpraz refilled, had Flu shot at work...    OSA> he had an AHI=82 in 2010 & started on CPAP20 by [facility] & he continues follow up there (we do not have notes)...    Hx  mild asthma> he still smokes an occas cigar & asked to quit completely!!! He denies CP, palpit, SOB, edema...    HBP> on ExforgeHCT & K+; BP today= 124/76 & similar at work; he remains asymptomatic...    Hyperlipid> on Simva40 + Niaspan; last FLP 4/12 showed Chol ok but incr TG & he understands that improvement here requires him to lose weight!    Obesity> wt= 314# which is up 5# over the last 34mo; we reviewed diet/ exercise/ wt reduction program...    GI> GERD, Rectal fissure> on Prilosec20 & AnusolHC as needed; he is overdue for a f/u colonoscopy & will contact GI for this screening procedure...    GU> Low-T & ED>  He takes a bunch of vits & herbs including Chromium, cinnamon, vinegar, MVI, etc; DrDahlstedt treats him w/ Testopel implant...    DJD> he uses OTC analgesics and Vicodin as needed...    Depression> he remains on Zoloft25 & Xanax as needed...         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 it got worse and was sent to [facility] who  hospitalized him 3/08 w/ MRSA infection- Rx's w/ Vancomycin infusions... now resolved.  OBSTRUCTIVE SLEEP APNEA (ICD-327.23) - Apr10> had Sleep Study at [facility]> pos for OSA w/ AHI= 82, desat to 85%... split night study w/ good control on CPAP 20 (see report)... started on CPAP by [facility] & still followed by him...  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 no recent exac and no regular meds... told to discontinue all smoking> denies much cough, sputum, dyspnea, CP, etc...  HYPERTENSION (ICD-401.9) - followed by DrGollan/ [facility]> on ECASA 325mg /d & EXFORGE 10/320/25 daily + KCl 62mEq/d... BP today = 124/76 doing well... he had CP while hosp w/ the MRSA 2008 and DrWolicki consulted DrMcQueen who did a NuclearStressTest 3/08 which was normal... and a 2DEcho which showed mild MR and nl LVF... ~ 10/12:  f/u visit DrGollan at  [facility]> note reviewed, no changes made, DrGollan desperately wants him to lose weight!  HYPERLIPIDEMIA (ICD-272.4) - he's been switched around by [facility] betw Simva/ Caduet/ back to SIMVASTATIN 40mg /d now + NIACIN 500mg Bid + FISH OIL 1000mg Bid...  still not on any kind of diet but his weight is stable at 294#.Marland Kitchen. ~  FLP 1/08 showed TChol 195, TG 119, HDL 41, LDL 131 ~  FLP 2/09 showed TChol 214, TG 214, HDL 39, LDL 127 ~  12/09: recent labs by [facility]- we don't have these results... ~  FLP 5/10 on 3 meds above showed TChol 148, TG 81, HDL 48, LDL 84 ~  FLP 11/10 on 3 meds showed TChol 135, TG 145, HDL 36, LDL 70 ~  FLP 5/11 showed TChol 145, TG 128, HDL 43, LDL 77... keep same Rx. ~  FLP 10/11 showed TChol 152, TG 198, HDL 41, LDL 72... same meds, better diet, get wt down! ~  FLP 4/12 showed TChol 141, TG 200, HDL 39, LDL 62... Similar> needs better low fat diet, get wt down. ~  10/12:  FLP not rechecked today as he has not lost wt etc...  MORBID OBESITY (ICD-278.01) - despite all efforts weight is up to 2314# now... discussed wt watchers, nutrition center, poss bariatric approach... he's not been able to exercise but will join the Y & start diet program! ~  5/11:  he reports that DrElkins gave him 30d supply of Phenteramine which really helped- lost 20#, but put it all back + 9# more off this med... we discussed the dangers of diet pills & rec diet + exercise, neither of which he has tried in earnest! ~  10/11:  weight up 15# to 314# today> discussed diet, exercise, etc... ~  4/12:  Weight = 309# and needs to do better w/ diet/ exercise... ~  10/12:  Weight = 314# and we reviewed diet, exercise, wt loss program...  GERD (ICD-530.81) - he had an EGD from [facility] 4/03 which was neg... he takes OMEPRAZOLE 20mg /d for reflux symptoms which works well...  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 prev tried Diltiazem cream etc... ~  pt reports colonoscopy by  [facility] 7/03- reported neg & f/u planned 5 yrs> he is overdue & will call for f/u exam...  TESTOSTERONE DEFICIENCY (ICD-257.2) &  ERECTILE DYSFUNCTION (ICD-302.72) - prev on TESTIM Gel 1%= 5gm daily and followed by DrDahlstadt> changed to Testopel Implants... also takes Viagra Prn...  ~  labs 5/10 showed PSA = 0.85 ~  labs 5/11 showed PSA= 0.65 ~  Labs 4/12 showed PSA= 0.79  OSTEOARTHRITIS (ICD-715.90) DEGENERATIVE DISC  DISEASE (ICD-722.6) - followed by DrMurphy for right knee pain... prev seen by [facility] Knife for DDD and LBP... ~  s/p left CTS surg 2009 ~  s/p right knee arthroscopy by DrMurphy 2/10... also had cortisone shot in knee 4/10 ~  s/p trigger finger injections by DrGramig ~  4/12:  s/p left knee arthroscopy by DrMurphy> improved...  DYSTHYMIA (ICD-300.4) - some stress, some depression which is mild by his determination... he tried Lexapro last year but didn't follow up... now using Xanax as needed, and started ZOLOFT 25mg /d in Feb10- he reports better, wants to continue this dose.   Past Surgical History  Procedure Date  . Sphincterotomy   . Shoulder arthroscopy     right  . Other surgical history     arthroscopic subacromial decompression  . Other surgical history     open resection, distal right clavicle  . Knee surgery 09/2010    left knee    =>  s/p left knee arthroscopy 4/12 by DrMurphy      Outpatient Encounter Prescriptions as of 04/04/2011  Medication Sig Dispense Refill  . ALPRAZolam (XANAX) 0.5 MG tablet Take 1 tablet (0.5 mg total) by mouth 3 (three) times daily as needed. For nerves  90 tablet  5  . Apple Cider Vinegar 500 MG TABS Take by mouth. 4 tsp       . aspirin 81 MG tablet Take 81 mg by mouth daily.        . Chromium Picolinate 200 MCG TABS Take 1 tablet by mouth daily.        . Cinnamon 500 MG capsule Take 1,000 mg by mouth daily.       Marland Kitchen EXFORGE HCT 10-320-25 MG TABS TAKE 1 TABLET BY MOUTH ONCE A DAY  90 tablet  4  . Ginkgo Biloba 100 MG CAPS  Take 1 capsule by mouth daily.        Marland Kitchen HYDROcodone-acetaminophen (VICODIN) 5-500 MG per tablet Take one tablet by mouth every 6-8 hours as needed NOT TO EXCEED 3 PER DAY  90 tablet  5  . hydrocortisone (PROCTOSOL HC) 2.5 % rectal cream Apply as directed after each BM       . Multiple Vitamin (MULTIVITAMIN) tablet Take 1 tablet by mouth daily.        . niacin 500 MG CR capsule Take 500 mg by mouth 2 (two) times daily.       . Omega-3 Fatty Acids (FISH OIL TRIPLE STRENGTH) 1400 MG CAPS Take 1 capsule by mouth 2 (two) times daily.        Marland Kitchen omeprazole (PRILOSEC) 20 MG capsule TAKE 1 CAPSULE BY MOUTH ONCE A DAY  90 capsule  4  . Potassium Gluconate 550 MG TABS Take 1 tablet by mouth daily.        . sertraline (ZOLOFT) 25 MG tablet TAKE 1 TABLET BY MOUTH EVERY DAY  90 tablet  2  . simvastatin (ZOCOR) 40 MG tablet TAKE 1 TABLET BY MOUTH AT BEDTIME  90 tablet  4  . VIAGRA 100 MG tablet USE AS DIRECTED  10 tablet  5    No Known Allergies   Current Medications, Allergies, Past Medical History, Past Surgical History, Family History, and Social History were reviewed in [employer] record.    Review of Systems         See HPI - all other systems neg except as noted... The patient complains of dyspnea on exertion.  The patient denies anorexia,  fever, weight loss, weight gain, vision loss, decreased hearing, hoarseness, chest pain, syncope, peripheral edema, prolonged cough, headaches, hemoptysis, abdominal pain, melena, hematochezia, severe indigestion/heartburn, hematuria, incontinence, muscle weakness, suspicious skin lesions, transient blindness, difficulty walking, depression, unusual weight change, abnormal bleeding, enlarged lymph nodes, and angioedema.     Objective:   Physical Exam     WD, Obese, 60 y/o WM in NAD... GENERAL:  Alert & oriented; pleasant & cooperative. HEENT:  [place]/AT, EOM-wnl, PERRLA, EACs-clear, TMs-wnl, NOSE-clear, THROAT-clear & wnl. NECK:  Supple w/  fairROM; no JVD; normal carotid impulses w/o bruits; no thyromegaly or nodules palpated; no lymphadenopathy. CHEST:  Clear to P & A; without wheezes/ rales/ or rhonchi. HEART:  Regular Rhythm; gr 1/6 SEM, without rubs or gallops... ABDOMEN:  Obese soft & nontender; normal bowel sounds; no organomegaly or masses detected.  EXT: without deformities, mild arthritic changes; no varicose veins/ +venous insuffic/ no edema.right knee w/ ace wrap.  NEURO:  CN's intact; no focal neuro deficits... DERM:  Testopel Implants palpated in his right lat butt cheek area, no infection or drainage seen.   Assessment & Plan:   OSA>  On CPAP per drKelly & pt informs me he has f/u appt soon...  Asthma>  Still smoking cigars & needs to quit, we discussed smoking cessation but he doesn't want med help etc...  HBP>  Controlled on the exforgeHCT Rx, he likes the one pill;  discussed the need to lose wt, no salt, etc...  Hyperlipidemia>  FLPs reviewed> if he wants to improve these numbers MUST get on diet & get wt down!!!  OBESITY>  As above!!!  GU>  Per DrDahlstedt on Testopel implants  DJD>  S/p arthroscopic surg left knee by DrMurphy & feeling better already.Marland KitchenMarland Kitchen

## 2011-05-02 ENCOUNTER — Telehealth: Payer: Self-pay | Admitting: Pulmonary Disease

## 2011-05-02 MED ORDER — AZITHROMYCIN 250 MG PO TABS: ORAL_TABLET | ORAL | Status: AC

## 2011-05-02 NOTE — Telephone Encounter (Signed)
I spoke with pt and he c/o coughing up green phlem, sneezing, sinus pressure, runny nose, wheezing x Saturday. Pt is requesting an abx be called in for him. Pt denies nay fever, nausea, vomiting. Please advise Dr. Kriste Basque, thanks  No Known Allergies

## 2011-05-02 NOTE — Telephone Encounter (Signed)
I spoke with pt and is aware of SN recs. Pt verbalized understanding and rx has been sent to [facility].

## 2011-05-02 NOTE — Telephone Encounter (Signed)
Per SN---ok for zpak #1 take as directed---with no refills.  Use mucinex 2 po bid with plenty of fluids.  Will need ov if not better.  thanks

## 2011-05-10 ENCOUNTER — Telehealth: Payer: Self-pay | Admitting: Pulmonary Disease

## 2011-05-10 MED ORDER — HYDROCODONE-HOMATROPINE 5-1.5 MG/5ML PO SYRP: 5.0000 mL | ORAL_SOLUTION | Freq: Four times a day (QID) | ORAL | Status: AC | PRN

## 2011-05-10 NOTE — Telephone Encounter (Signed)
Pt reports that he finished Zpak on Friday.  Pt continues to cough with some green mucus.  Explained to pt that Zpak continues to work 5-10 days after completion.  Pt is taking Mucinex 2 tabs bid but is not controlling cough.  Pt would like cough med called in.  Please advise.

## 2011-05-10 NOTE — Telephone Encounter (Signed)
Pt informed that rx for cough was called to [facility] per Dr Kriste Basque.

## 2011-05-10 NOTE — Telephone Encounter (Signed)
Per SN---ok for hycodan #6oz  1 tsp every 6 hours prn cough with 1 refill.  thanks

## 2011-06-30 ENCOUNTER — Ambulatory Visit (INDEPENDENT_AMBULATORY_CARE_PROVIDER_SITE_OTHER): Payer: BC Managed Care – PPO | Admitting: Adult Health

## 2011-06-30 ENCOUNTER — Encounter: Payer: Self-pay | Admitting: Adult Health

## 2011-06-30 VITALS — BP 140/78 | HR 90 | Temp 97.6°F | Ht 72.0 in | Wt 312.6 lb

## 2011-06-30 DIAGNOSIS — R6884 Jaw pain: Secondary | ICD-10-CM

## 2011-06-30 NOTE — Assessment & Plan Note (Addendum)
Right jaw pain ? Etiology  Possible tender lymph node vs musculoskeletal pain   Plan;  Warm heat to jaw as needed  Aleve twice daily for five days with food  Lemon drops for a few days  Follow up with Dr. Kriste Basque as planned  If no improvements or worsening call back for sooner appointment

## 2011-06-30 NOTE — Progress Notes (Signed)
Subjective:    Patient ID: Dakota Ellison, male    DOB: [birth year], 61 y.o.   MRN: [id number]  HPI  61 y/o WM  ~  Oct 05, 2009:  his head is now shaved, works for [employer], "leaves nothing to grab"...    He saw DrGollan at [facility] for f/u HBP, Hyperlipid, Obesity & OSA- BP controlled on med, due for f/u FLP on his 3 meds, still sees [facility] who wrote for his CPAP...   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 I refused this & explained why> rec diet + exercise...   Saw DrDahlstadt 4/11 for his Hypogonadism- he had Testopel Implants but 1 pellet site got infected & 2 pellets popped out... Rx by Urology, they plan f/u Testosterone level.  ~  April 05, 2010:  he's had a good 19mo but unfortunately has gained 15# up to 314# & we discussed renewed diet + exercise efforts for weight reduction... BP controlled on Exforge;  denies CP, palpit, ch in DOE, etc;  FLP not quite as good w/ the wt gain & reminded to take the Simva4,0 Niacin, & FishOil daily... requests refill Vocodin & Rebeca Allegra today;  already had the 2010 flu vaccine...  He had f/u DrDahlstadt 8/11 w/ 12 Testopel implants placed & due for f/u Testosterone level next month...   ~  October 04, 2010:  19mo ROV & doing satis> just had left knee arthroscopic surg by DrMurphy & already feeling better;  He saw [facility] 10/11 for check up> doing well, no changes made, f/u 24yr...  Hasn't been able to lose wt & we reviewed diet + exercise prescription...  See fasting labs below...  Wants refills Xanax & Vicodin (lim to 3/d max).  ~  April 04, 2011:  19mo ROV & he reports doing well, needs Vicodin & Alpraz refilled, had Flu shot at work...    OSA> he had an AHI=82 in 2010 & started on CPAP20 by [facility] & he continues follow up there (we do not have notes)...    Hx mild asthma> he still smokes an occas cigar & asked to quit completely!!! He  denies CP, palpit, SOB, edema...    HBP> on ExforgeHCT & K+; BP today= 124/76 & similar at work; he remains asymptomatic...    Hyperlipid> on Simva40 + Niaspan; last FLP 4/12 showed Chol ok but incr TG & he understands that improvement here requires him to lose weight!    Obesity> wt= 314# which is up 5# over the last 19mo; we reviewed diet/ exercise/ wt reduction program...    GI> GERD, Rectal fissure> on Prilosec20 & AnusolHC as needed; he is overdue for a f/u colonoscopy & will contact GI for this screening procedure...    GU> Low-T & ED>  He takes a bunch of vits & herbs including Chromium, cinnamon, vinegar, MVI, etc; DrDahlstedt treats him w/ Testopel implant...    DJD> he uses OTC analgesics and Vicodin as needed...    Depression> he remains on Zoloft25 & Xanax as needed...   60/24/2013 Acute OV  Pt presents for an acute office visit. Complains of non-radiating right sided lower jaw pain for 1 week. Complains that it is sore and tender to touch. At its worst, pain 8/10. Today pain is 5/10. Tender to touch . No otc used. No pain with chewing . Mild puffiness along  angle of jaw initially but resolved. Had URI 1-2 weeks ago, now resolved . No sinus pain or pressure. No ear pain. No neck pain.  No redness or rash. 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 it got worse and was sent to DrWolicki who hospitalized him 3/08 w/ MRSA infection- Rx's w/ Vancomycin infusions... now resolved.  OBSTRUCTIVE SLEEP APNEA (ICD-327.23) - Apr10> had Sleep Study at [facility]> pos for OSA w/ AHI= 82, desat to 85%... split night study w/ good control on CPAP 20 (see report)... started on CPAP by [facility] & still followed by him...  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 no recent exac and no regular meds... told to discontinue all smoking> denies much cough, sputum,  dyspnea, CP, etc...  HYPERTENSION (ICD-401.9) - followed by DrGollan/ [facility]> on ECASA 325mg /d & EXFORGE 10/320/25 daily + KCl 40mEq/d... he had CP while hosp w/ the MRSA 2008 and DrWolicki consulted DrMcQueen who did a NuclearStressTest 3/08 which was normal... and a 2DEcho which showed mild MR and nl LVF... ~ 10/12:  f/u visit DrGollan at [facility]> note reviewed, no changes made, DrGollan desperately wants him to lose weight!  HYPERLIPIDEMIA (ICD-272.4) - he's been switched around by [facility] betw Simva/ Caduet/ back to SIMVASTATIN 40mg /d now + NIACIN 500mg Bid + FISH OIL 1000mg Bid...  still not on any kind of diet but his weight is stable at 294#.Marland Kitchen. ~  FLP 1/08 showed TChol 195, TG 119, HDL 41, LDL 131 ~  FLP 2/09 showed TChol 214, TG 214, HDL 39, LDL 127 ~  12/09: recent labs by [facility]- we don't have these results... ~  FLP 5/10 on 3 meds above showed TChol 148, TG 81, HDL 48, LDL 84 ~  FLP 11/10 on 3 meds showed TChol 135, TG 145, HDL 36, LDL 70 ~  FLP 5/11 showed TChol 145, TG 128, HDL 43, LDL 77... keep same Rx. ~  FLP 10/11 showed TChol 152, TG 198, HDL 41, LDL 72... same meds, better diet, get wt down! ~  FLP 4/12 showed TChol 141, TG 200, HDL 39, LDL 62... Similar> needs better low fat diet, get wt down. ~  10/12:  FLP not rechecked today as he has not lost wt etc...  MORBID OBESITY (ICD-278.01) - despite all efforts weight is up to 2314# now... discussed wt watchers, nutrition center, poss bariatric approach... he's not been able to exercise but will join the Y & start diet program! ~  5/11:  he reports that DrElkins gave him 30d supply of Phenteramine which really helped- lost 20#, but put it all back + 9# more off this med... we discussed the dangers of diet pills & rec diet + exercise, neither of which he has tried in earnest! ~  10/11:  weight up 15# to 314# today> discussed diet, exercise, etc... ~  4/12:  Weight = 309# and needs to do better w/ diet/ exercise... ~  10/12:   Weight = 314# and we reviewed diet, exercise, wt loss program... ~01/12: Weight = 312.6  GERD (ICD-530.81) - he had an EGD from [facility] 4/03 which was neg... he takes OMEPRAZOLE 20mg /d for reflux symptoms which works well...  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 prev tried Diltiazem cream etc... ~  pt reports colonoscopy by [facility] 7/03- reported  neg & f/u planned 5 yrs> he is overdue & will call for f/u exam...  TESTOSTERONE DEFICIENCY (ICD-257.2) &  ERECTILE DYSFUNCTION (ICD-302.72) - prev on TESTIM Gel 1%= 5gm daily and followed by DrDahlstadt> changed to Testopel Implants... also takes Viagra Prn...  ~  labs 5/10 showed PSA = 0.85 ~  labs 5/11 showed PSA= 0.65 ~  Labs 4/12 showed PSA= 0.79  OSTEOARTHRITIS (ICD-715.90) DEGENERATIVE DISC DISEASE (ICD-722.6) - followed by DrMurphy for right knee pain... prev seen by Mayford Knife for DDD and LBP... ~  s/p left CTS surg 2009 ~  s/p right knee arthroscopy by DrMurphy 2/10... also had cortisone shot in knee 4/10 ~  s/p trigger finger injections by DrGramig ~  4/12:  s/p left knee arthroscopy by DrMurphy> improved...  DYSTHYMIA (ICD-300.4) - some stress, some depression which is mild by his determination... he tried Lexapro last year but didn't follow up... now using Xanax as needed, and started ZOLOFT 25mg /d in Feb10- he reports better, wants to continue this dose.   Past Surgical History  Procedure Date  . Sphincterotomy   . Shoulder arthroscopy     right  . Other surgical history     arthroscopic subacromial decompression  . Other surgical history     open resection, distal right clavicle  . Knee surgery 09/2010    left knee    =>  s/p left knee arthroscopy 4/12 by DrMurphy      Outpatient Encounter Prescriptions as of 06/30/2011  Medication Sig Dispense Refill  . ALPRAZolam (XANAX) 0.5 MG tablet Take 1 tablet (0.5 mg total) by mouth 3 (three) times daily as needed. For nerves  90  tablet  5  . Apple Cider Vinegar 500 MG TABS Take by mouth. 4 tsp       . aspirin 81 MG tablet Take 81 mg by mouth daily.        . Chromium Picolinate 200 MCG TABS Take 1 tablet by mouth daily.        . Cinnamon 500 MG capsule Take 1,000 mg by mouth daily.       Marland Kitchen EXFORGE HCT 10-320-25 MG TABS TAKE 1 TABLET BY MOUTH ONCE A DAY  90 tablet  4  . Ginkgo Biloba 100 MG CAPS Take 1 capsule by mouth daily.        Marland Kitchen HYDROcodone-acetaminophen (VICODIN) 5-500 MG per tablet Take one tablet by mouth every 6-8 hours as needed NOT TO EXCEED 3 PER DAY  90 tablet  5  . hydrocortisone (PROCTOSOL HC) 2.5 % rectal cream Apply as directed after each BM       . Multiple Vitamin (MULTIVITAMIN) tablet Take 1 tablet by mouth daily.        . niacin 500 MG CR capsule Take 500 mg by mouth 2 (two) times daily.       . Omega-3 Fatty Acids (FISH OIL TRIPLE STRENGTH) 1400 MG CAPS Take 1 capsule by mouth 2 (two) times daily.        Marland Kitchen omeprazole (PRILOSEC) 20 MG capsule TAKE 1 CAPSULE BY MOUTH ONCE A DAY  90 capsule  4  . Potassium Gluconate 550 MG TABS Take 1 tablet by mouth daily.        . sertraline (ZOLOFT) 25 MG tablet TAKE 1 TABLET BY MOUTH EVERY DAY  90 tablet  2  . simvastatin (ZOCOR) 40 MG tablet TAKE 1 TABLET BY MOUTH AT BEDTIME  90 tablet  4  . VIAGRA 100 MG tablet USE AS  DIRECTED  10 tablet  5    No Known Allergies   Current Medications, Allergies, Past Medical History, Past Surgical History, Family History, and Social History were reviewed in [employer] record.    Review of Systems         Constitutional:   No  weight loss, night sweats,  Fevers, chills, fatigue, or  Lassitude. No recent illness.   HEENT:   No headaches. No difficulty swallowing or chewing. No tooth/dental problems, no gum bleeding or mouth sores. No sore throat, No sneezing, itching, ear ache, nasal congestion, post nasal drip.  CV:  No chest pain,  Orthopnea, PND, swelling in lower extremities, anasarca,  dizziness, palpitations, syncope.   GI  No heartburn, indigestion, abdominal pain, nausea, vomiting, diarrhea, change in bowel habits, loss of appetite, bloody stools.   Resp: No shortness of breath with exertion or at rest.  No excess mucus, no productive cough,  No non-productive cough,  No coughing up of blood.  No change in color of mucus.  No wheezing.  No chest wall deformity  Skin: no rash or lesions.  GU: no dysuria, change in color of urine, no urgency or frequency.  No flank pain, no hematuria   MS:  No joint pain or swelling.  No decreased range of motion.  No back pain.  Psych:  No change in mood or affect. No depression or anxiety.  No memory loss.       Objective:   Physical Exam     WD, Obese, 61 y/o WM in NAD... GENERAL:  Alert & oriented; pleasant & cooperative. HEENT:  [place]/AT, EOM-wnl, PERRLA, EACs-clear, TMs-wnl, NOSE-clear, THROAT-clear & wnl. Oral mucus membranes pink and moist. Gums pink, no lesions noted. No oral lesions noted. No obvious dental caries, several fillings noted. , no TMJ click, +grinding with jaw rom  NECK:  Supple w/ fairROM; no JVD; normal carotid impulses w/o bruits; no thyromegaly or nodules palpated; no lymphadenopathy. Tender along anterior cervical /angle of jaw. No redness or rash  CHEST:  Clear to P & A; without wheezes/ rales/ or rhonchi. HEART:  Regular Rhythm; gr 1/6 SEM, without rubs or gallops... ABDOMEN:  Obese soft & nontender; normal bowel sounds; no organomegaly or masses detected.  EXT: without deformities, mild arthritic changes; no varicose veins/ +venous insuffic/ no edema NEURO:  no focal neuro deficits...   Assessment & Plan:

## 2011-06-30 NOTE — Patient Instructions (Addendum)
Warm heat to jaw as needed  Aleve twice daily for five days with food  Lemon drops for a few days  Follow up with Dr. Kriste Basque as planned  If no improvements or worsening call back for sooner appointment

## 2011-07-11 ENCOUNTER — Other Ambulatory Visit: Payer: Self-pay | Admitting: Pulmonary Disease

## 2011-07-28 ENCOUNTER — Telehealth: Payer: Self-pay | Admitting: Pulmonary Disease

## 2011-07-28 MED ORDER — MAGIC MOUTHWASH: ORAL | Status: DC

## 2011-07-28 MED ORDER — AZITHROMYCIN 250 MG PO TABS: ORAL_TABLET | ORAL | Status: DC

## 2011-07-28 NOTE — Telephone Encounter (Signed)
Pt called back again; waiting to hear back. Dakota Ellison

## 2011-07-28 NOTE — Telephone Encounter (Signed)
Called spoke with patient who c/o sore throat that hurts to swallow, prod cough with green mucus, body aches x1 day - denies SOB, wheezing.  Last ov: TP 1.24.13, SN 10.29.13 and upcoming ov with SN 4.29.13.  NKDA.  [facility].  Dr Kriste Basque please advise, thanks.

## 2011-07-28 NOTE — Telephone Encounter (Signed)
Per SN:  Ok for z pak # 1 x 0 refills.  Take as directed.  And MMW #4oz x 0 refills 1 tsp qid swish and swallow and continue on tylenol prn   Called and spoke with pt. Informed him of SN's response/recs.  And rx sent to pharmacy.  Pt aware.

## 2011-08-01 ENCOUNTER — Ambulatory Visit (INDEPENDENT_AMBULATORY_CARE_PROVIDER_SITE_OTHER): Payer: BC Managed Care – PPO | Admitting: Adult Health

## 2011-08-01 ENCOUNTER — Encounter: Payer: Self-pay | Admitting: Adult Health

## 2011-08-01 ENCOUNTER — Other Ambulatory Visit (INDEPENDENT_AMBULATORY_CARE_PROVIDER_SITE_OTHER): Payer: BC Managed Care – PPO

## 2011-08-01 VITALS — BP 118/78 | HR 96 | Temp 98.9°F | Ht 72.0 in | Wt 305.8 lb

## 2011-08-01 DIAGNOSIS — J029 Acute pharyngitis, unspecified: Secondary | ICD-10-CM

## 2011-08-01 MED ORDER — AZITHROMYCIN 250 MG PO TABS: ORAL_TABLET | ORAL | Status: AC

## 2011-08-01 NOTE — Assessment & Plan Note (Signed)
Flare :  Plan;  Zpack take as directed  Salt water gargles as needed.  Zyrtec 10mg  At bedtime  For 1 week and then As needed  Drainage  Tylenol As needed   Please contact office for sooner follow up if symptoms do not improve or worsen or seek emergency care   follow up Dr. Kriste Basque  In 2 months as planned

## 2011-08-01 NOTE — Patient Instructions (Addendum)
Zpack take as directed  Salt water gargles as needed.  Zyrtec 10mg  At bedtime  For 1 week and then As needed  Drainage  Tylenol As needed   Please contact office for sooner follow up if symptoms do not improve or worsen or seek emergency care   follow up Dr. Kriste Basque  In 2 months as planned

## 2011-08-01 NOTE — Progress Notes (Signed)
Subjective:    Patient ID: Dakota Ellison, male    DOB: [birth year], 61 y.o.   MRN: [id number]  HPI  61 y/o WM  ~  Oct 05, 2009:  his head is now shaved, works for [employer], "leaves nothing to grab"...    He saw DrGollan at [facility] for f/u HBP, Hyperlipid, Obesity & OSA- BP controlled on med, due for f/u FLP on his 3 meds, still sees [facility] who wrote for his CPAP...   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 I refused this & explained why> rec diet + exercise...   Saw DrDahlstadt 4/11 for his Hypogonadism- he had Testopel Implants but 1 pellet site got infected & 2 pellets popped out... Rx by Urology, they plan f/u Testosterone level.  ~  April 05, 2010:  he's had a good 78mo but unfortunately has gained 15# up to 314# & we discussed renewed diet + exercise efforts for weight reduction... BP controlled on Exforge;  denies CP, palpit, ch in DOE, etc;  FLP not quite as good w/ the wt gain & reminded to take the Simva4,0 Niacin, & FishOil daily... requests refill Vocodin & Rebeca Allegra today;  already had the 2010 flu vaccine...  He had f/u DrDahlstadt 8/11 w/ 12 Testopel implants placed & due for f/u Testosterone level next month...   ~  October 04, 2010:  78mo ROV & doing satis> just had left knee arthroscopic surg by DrMurphy & already feeling better;  He saw [facility] 10/11 for check up> doing well, no changes made, f/u 79yr...  Hasn't been able to lose wt & we reviewed diet + exercise prescription...  See fasting labs below...  Wants refills Xanax & Vicodin (lim to 3/d max).  ~  April 04, 2011:  78mo ROV & he reports doing well, needs Vicodin & Alpraz refilled, had Flu shot at work...    OSA> he had an AHI=82 in 2010 & started on CPAP20 by [facility] & he continues follow up there (we do not have notes)...    Hx mild asthma> he still smokes an occas cigar & asked to quit completely!!! He  denies CP, palpit, SOB, edema...    HBP> on ExforgeHCT & K+; BP today= 124/76 & similar at work; he remains asymptomatic...    Hyperlipid> on Simva40 + Niaspan; last FLP 4/12 showed Chol ok but incr TG & he understands that improvement here requires him to lose weight!    Obesity> wt= 314# which is up 5# over the last 78mo; we reviewed diet/ exercise/ wt reduction program...    GI> GERD, Rectal fissure> on Prilosec20 & AnusolHC as needed; he is overdue for a f/u colonoscopy & will contact GI for this screening procedure...    GU> Low-T & ED>  He takes a bunch of vits & herbs including Chromium, cinnamon, vinegar, MVI, etc; DrDahlstedt treats him w/ Testopel implant...    DJD> he uses OTC analgesics and Vicodin as needed...    Depression> he remains on Zoloft25 & Xanax as needed...   06/30/2011 Acute OV  Pt presents for an acute office visit. Complains of non-radiating right sided lower jaw pain for 1 week. Complains that it is sore and tender to touch. At its worst, pain 8/10. Today pain is 5/10. Tender to touch . No otc used. No pain with chewing . Mild puffiness along  angle of jaw initially but resolved. Had URI 1-2 weeks ago, now resolved . No sinus pain or pressure. No ear pain. No neck pain.  No redness or rash. No fever  >tx w/ NSAIDs and warm heat   08/01/2011 Acute OV  Complains of sore throat, hurts to swallow, prod cough with green mucus x5days. Finished zpak and MMW this morning.Has some sinus drainage. Strep test is neg today. No fever.  Taking tylenol with some help. Sore throat is worse at night.  No chest pain , fever or vomitting.             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 it got worse and was sent to DrWolicki who hospitalized him 3/08 w/ MRSA infection- Rx's w/ Vancomycin infusions... now resolved.  OBSTRUCTIVE SLEEP APNEA (ICD-327.23) - Apr10> had Sleep  Study at [facility]> pos for OSA w/ AHI= 82, desat to 85%... split night study w/ good control on CPAP 20 (see report)... started on CPAP by [facility] & still followed by him...  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 no recent exac and no regular meds... told to discontinue all smoking> denies much cough, sputum, dyspnea, CP, etc...  HYPERTENSION (ICD-401.9) - followed by DrGollan/ [facility]> on ECASA 325mg /d & EXFORGE 10/320/25 daily + KCl 28mEq/d... he had CP while hosp w/ the MRSA 2008 and DrWolicki consulted DrMcQueen who did a NuclearStressTest 3/08 which was normal... and a 2DEcho which showed mild MR and nl LVF... ~ 10/12:  f/u visit DrGollan at [facility]> note reviewed, no changes made, DrGollan desperately wants him to lose weight!  HYPERLIPIDEMIA (ICD-272.4) - he's been switched around by [facility] betw Simva/ Caduet/ back to SIMVASTATIN 40mg /d now + NIACIN 500mg Bid + FISH OIL 1000mg Bid...  still not on any kind of diet but his weight is stable at 294#.Marland Kitchen. ~  FLP 1/08 showed TChol 195, TG 119, HDL 41, LDL 131 ~  FLP 2/09 showed TChol 214, TG 214, HDL 39, LDL 127 ~  12/09: recent labs by [facility]- we don't have these results... ~  FLP 5/10 on 3 meds above showed TChol 148, TG 81, HDL 48, LDL 84 ~  FLP 11/10 on 3 meds showed TChol 135, TG 145, HDL 36, LDL 70 ~  FLP 5/11 showed TChol 145, TG 128, HDL 43, LDL 77... keep same Rx. ~  FLP 10/11 showed TChol 152, TG 198, HDL 41, LDL 72... same meds, better diet, get wt down! ~  FLP 4/12 showed TChol 141, TG 200, HDL 39, LDL 62... Similar> needs better low fat diet, get wt down. ~  10/12:  FLP not rechecked today as he has not lost wt etc...  MORBID OBESITY (ICD-278.01) - despite all efforts weight is up to 2314# now... discussed wt watchers, nutrition center, poss bariatric approach... he's not been able to exercise but will join the Y & start diet program! ~  5/11:  he reports that DrElkins gave him 30d supply of Phenteramine  which really helped- lost 20#, but put it all back + 9# more off this med... we discussed the dangers of diet pills & rec diet + exercise, neither of which he has tried in earnest! ~  10/11:  weight up 15# to 314# today> discussed diet, exercise, etc... ~  4/12:  Weight = 309# and needs to do better w/ diet/ exercise... ~  10/12:  Weight = 314# and we reviewed diet, exercise, wt loss program... ~01/12: Weight = 312.6  GERD (ICD-530.81) - he had an EGD from [facility] 4/03 which was neg... he takes OMEPRAZOLE 20mg /d for reflux symptoms which works well...  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 prev tried Diltiazem cream etc... ~  pt reports colonoscopy by [facility] 7/03- reported neg & f/u planned 5 yrs> he is overdue & will call for f/u exam...  TESTOSTERONE DEFICIENCY (ICD-257.2) &  ERECTILE DYSFUNCTION (ICD-302.72) - prev on TESTIM Gel 1%= 5gm daily and followed by DrDahlstadt> changed to Testopel Implants... also takes Viagra Prn...  ~  labs 5/10 showed PSA = 0.85 ~  labs 5/11 showed PSA= 0.65 ~  Labs 4/12 showed PSA= 0.79  OSTEOARTHRITIS (ICD-715.90) DEGENERATIVE DISC DISEASE (ICD-722.6) - followed by DrMurphy for right knee pain... prev seen by Mayford Knife for DDD and LBP... ~  s/p left CTS surg 2009 ~  s/p right knee arthroscopy by DrMurphy 2/10... also had cortisone shot in knee 4/10 ~  s/p trigger finger injections by DrGramig ~  4/12:  s/p left knee arthroscopy by DrMurphy> improved...  DYSTHYMIA (ICD-300.4) - some stress, some depression which is mild by his determination... he tried Lexapro last year but didn't follow up... now using Xanax as needed, and started ZOLOFT 25mg /d in Feb10- he reports better, wants to continue this dose.   Past Surgical History  Procedure Date  . Sphincterotomy   . Shoulder arthroscopy     right  . Other surgical history     arthroscopic subacromial decompression  . Other surgical history     open  resection, distal right clavicle  . Knee surgery 09/2010    left knee    =>  s/p left knee arthroscopy 4/12 by DrMurphy      Outpatient Encounter Prescriptions as of 08/01/2011  Medication Sig Dispense Refill  . ALPRAZolam (XANAX) 0.5 MG tablet Take 1 tablet (0.5 mg total) by mouth 3 (three) times daily as needed. For nerves  90 tablet  5  . Apple Cider Vinegar 500 MG TABS Take by mouth. 4 tsp       . aspirin 81 MG tablet Take 81 mg by mouth daily.        . Chromium Picolinate 200 MCG TABS Take 1 tablet by mouth daily.        . Cinnamon 500 MG capsule Take 1,000 mg by mouth daily.       Marland Kitchen EXFORGE HCT 10-320-25 MG TABS TAKE 1 TABLET BY MOUTH ONCE A DAY  90 tablet  4  . Ginkgo Biloba 100 MG CAPS Take 1 capsule by mouth daily.        Marland Kitchen HYDROcodone-acetaminophen (VICODIN) 5-500 MG per tablet Take one tablet by mouth every 6-8 hours as needed NOT TO EXCEED 3 PER DAY  90 tablet  5  . hydrocortisone (PROCTOSOL HC) 2.5 % rectal cream Apply as directed after each BM       . Multiple Vitamin (MULTIVITAMIN) tablet Take 1 tablet by mouth daily.        . niacin 500 MG CR capsule Take 500 mg by mouth 2 (two) times daily.       . Omega-3 Fatty Acids (FISH OIL TRIPLE STRENGTH) 1400 MG CAPS Take 1 capsule by mouth 2 (two) times daily.        Marland Kitchen omeprazole (PRILOSEC) 20 MG capsule TAKE 1 CAPSULE BY MOUTH ONCE A DAY  90 capsule  4  .  Potassium Gluconate 550 MG TABS Take 1 tablet by mouth daily.        . sertraline (ZOLOFT) 25 MG tablet TAKE 1 TABLET BY MOUTH EVERY DAY  90 tablet  2  . simvastatin (ZOCOR) 40 MG tablet TAKE 1 TABLET BY MOUTH AT BEDTIME  90 tablet  4  . VIAGRA 100 MG tablet USE AS DIRECTED  10 tablet  5  . Alum & Mag Hydroxide-Simeth (MAGIC MOUTHWASH) SOLN 1 tsp swish and swallow four times a day.  120 mL  0  . DISCONTD: azithromycin (ZITHROMAX Z-PAK) 250 MG tablet Take as directed  6 each  0    No Known Allergies   Current Medications, Allergies, Past Medical History, Past Surgical History,  Family History, and Social History were reviewed in [employer] record.    Review of Systems         Constitutional:   No  weight loss, night sweats,  Fevers, chills, fatigue, or  Lassitude. No recent illness.   HEENT:   No headaches. No difficulty swallowing or chewing. No tooth/dental problems, no gum bleeding or mouth sores.   ++ nasal congestion, post nasal drip.  CV:  No chest pain,  Orthopnea, PND, swelling in lower extremities, anasarca, dizziness, palpitations, syncope.   GI  No heartburn, indigestion, abdominal pain, nausea, vomiting, diarrhea, change in bowel habits, loss of appetite, bloody stools.   Resp: No coughing up of blood.  No chest wall deformity  Skin: no rash or lesions.  GU: no dysuria, change in color of urine, no urgency or frequency.  No flank pain, no hematuria   MS:  No joint pain or swelling.  No decreased range of motion.  No back pain.  Psych:  No change in mood or affect. No depression or anxiety.  No memory loss.       Objective:   Physical Exam     WD, Obese, 61 y/o WM in NAD... GENERAL:  Alert & oriented; pleasant & cooperative. HEENT:  [place]/AT,   EACs-clear, TMs-wnl, NOSE-clear, THROAT-mild redness, no exudate . Oral mucus membranes pink and moist.   NECK:  Supple w/ fairROM; no JVD; normal carotid impulses w/o bruits; no thyromegaly or nodules palpated; no lymphadenopathy. Tender along anterior cervical /angle of jaw. No redness or rash  CHEST:  Clear to P & A; without wheezes/ rales/ or rhonchi. HEART:  Regular Rhythm; gr 1/6 SEM, without rubs or gallops... ABDOMEN:  Obese soft & nontender; normal bowel sounds; no organomegaly or masses detected.  EXT: without deformities, mild arthritic changes; no varicose veins/ +venous insuffic/ no edema NEURO:  no focal neuro deficits...   Assessment & Plan:

## 2011-09-13 ENCOUNTER — Telehealth: Payer: Self-pay | Admitting: Pulmonary Disease

## 2011-09-13 NOTE — Telephone Encounter (Signed)
Pt returned Leigh's call.  Holly D Pryor ° °

## 2011-09-13 NOTE — Telephone Encounter (Signed)
Per TP---we cant give pt anything without knowing what this rash is.  Either urgent care for eval or we can get pt in for appt on Friday with TP.  Called and spoke with pt and he is aware of TP recs.  appt has been made for pt on Friday to see TP at 10.  Pt is aware that if rash gets worse to seek medical care.  Pt voiced his understanding of this.

## 2011-09-13 NOTE — Telephone Encounter (Signed)
Called and spoke with pt and he stated that he has a rash that is raised and red  on his stomach, legs, arms, head and back.  He stated that he has had this for about 2 -3 days, itches a lot.  Has not started any new meds and not changed any soaps, detergents,etc.   SN please advise.  Thanks  No Known Allergies

## 2011-09-13 NOTE — Telephone Encounter (Signed)
lmomtcb for pt to discuss phone message.

## 2011-09-16 ENCOUNTER — Ambulatory Visit: Payer: BC Managed Care – PPO | Admitting: Adult Health

## 2011-10-03 ENCOUNTER — Other Ambulatory Visit (INDEPENDENT_AMBULATORY_CARE_PROVIDER_SITE_OTHER): Payer: BC Managed Care – PPO

## 2011-10-03 ENCOUNTER — Ambulatory Visit (INDEPENDENT_AMBULATORY_CARE_PROVIDER_SITE_OTHER): Payer: BC Managed Care – PPO | Admitting: Pulmonary Disease

## 2011-10-03 ENCOUNTER — Encounter: Payer: Self-pay | Admitting: Pulmonary Disease

## 2011-10-03 VITALS — BP 120/78 | HR 80 | Temp 98.1°F | Ht 72.0 in | Wt 311.0 lb

## 2011-10-03 DIAGNOSIS — F419 Anxiety disorder, unspecified: Secondary | ICD-10-CM

## 2011-10-03 DIAGNOSIS — K219 Gastro-esophageal reflux disease without esophagitis: Secondary | ICD-10-CM

## 2011-10-03 DIAGNOSIS — E291 Testicular hypofunction: Secondary | ICD-10-CM

## 2011-10-03 DIAGNOSIS — IMO0002 Reserved for concepts with insufficient information to code with codable children: Secondary | ICD-10-CM

## 2011-10-03 DIAGNOSIS — M1990 Unspecified osteoarthritis, unspecified site: Secondary | ICD-10-CM

## 2011-10-03 DIAGNOSIS — G4733 Obstructive sleep apnea (adult) (pediatric): Secondary | ICD-10-CM

## 2011-10-03 DIAGNOSIS — I10 Essential (primary) hypertension: Secondary | ICD-10-CM

## 2011-10-03 DIAGNOSIS — K602 Anal fissure, unspecified: Secondary | ICD-10-CM

## 2011-10-03 DIAGNOSIS — E785 Hyperlipidemia, unspecified: Secondary | ICD-10-CM

## 2011-10-03 DIAGNOSIS — J45909 Unspecified asthma, uncomplicated: Secondary | ICD-10-CM

## 2011-10-03 DIAGNOSIS — F341 Dysthymic disorder: Secondary | ICD-10-CM

## 2011-10-03 DIAGNOSIS — F411 Generalized anxiety disorder: Secondary | ICD-10-CM

## 2011-10-03 DIAGNOSIS — N139 Obstructive and reflux uropathy, unspecified: Secondary | ICD-10-CM

## 2011-10-03 LAB — CBC WITH DIFFERENTIAL/PLATELET
Basophils Absolute: 0.1 10*3/uL (ref 0.0–0.1)
Hemoglobin: 13.7 g/dL (ref 13.0–17.0)
Lymphocytes Relative: 28.3 % (ref 12.0–46.0)
Monocytes Relative: 8.1 % (ref 3.0–12.0)
Neutro Abs: 4.2 10*3/uL (ref 1.4–7.7)
RBC: 4.66 Mil/uL (ref 4.22–5.81)
RDW: 13.1 % (ref 11.5–14.6)
WBC: 7.8 10*3/uL (ref 4.5–10.5)

## 2011-10-03 LAB — BASIC METABOLIC PANEL
BUN: 20 mg/dL (ref 6–23)
CO2: 30 mEq/L (ref 19–32)
Calcium: 9.4 mg/dL (ref 8.4–10.5)
Creatinine, Ser: 1 mg/dL (ref 0.4–1.5)
Glucose, Bld: 106 mg/dL — ABNORMAL HIGH (ref 70–99)

## 2011-10-03 LAB — HEPATIC FUNCTION PANEL
Albumin: 4.2 g/dL (ref 3.5–5.2)
Alkaline Phosphatase: 71 U/L (ref 39–117)
Total Protein: 7.2 g/dL (ref 6.0–8.3)

## 2011-10-03 LAB — LIPID PANEL
Cholesterol: 137 mg/dL (ref 0–200)
HDL: 43.1 mg/dL (ref >39.00)
Triglycerides: 124 mg/dL (ref 0.0–149.0)

## 2011-10-03 MED ORDER — HYDROCODONE-ACETAMINOPHEN 5-500 MG PO TABS: ORAL_TABLET | ORAL | Status: DC

## 2011-10-03 MED ORDER — CLOTRIMAZOLE-BETAMETHASONE 1-0.05 % EX CREA: TOPICAL_CREAM | CUTANEOUS | Status: AC

## 2011-10-03 MED ORDER — ALPRAZOLAM 0.5 MG PO TABS: 0.5000 mg | ORAL_TABLET | Freq: Three times a day (TID) | ORAL | Status: DC | PRN

## 2011-10-03 MED ORDER — SILDENAFIL CITRATE 100 MG PO TABS: ORAL_TABLET | ORAL | Status: AC

## 2011-10-03 NOTE — Progress Notes (Signed)
Subjective:    Patient ID: Dakota Ellison, male    DOB: 07-25-1950, 61 y.o.   MRN: [id number]  HPI 61 y/o WM here for an add-on visit... he has multiple medical problems as noted below...    ~  Oct 05, 2009:  his head is now shaved, works for [employer], "leaves nothing to grab"...    He saw DrGollan at [facility] for f/u HBP, Hyperlipid, Obesity & OSA- BP controlled on med, due for f/u FLP on his 3 meds, still sees [facility] who wrote for his CPAP...   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 I refused this & explained why> rec diet + exercise...   Saw DrDahlstadt 4/11 for his Hypogonadism- he had Testopel Implants but 1 pellet site got infected & 2 pellets popped out... Rx by Urology, they plan f/u Testosterone level.  ~  April 05, 2010:  he's had a good 63mo but unfortunately has gained 15# up to 314# & we discussed renewed diet + exercise efforts for weight reduction... BP controlled on Exforge;  denies CP, palpit, ch in DOE, etc;  FLP not quite as good w/ the wt gain & reminded to take the Simva4,0 Niacin, & FishOil daily... requests refill Vocodin & Rebeca Allegra today;  already had the 2010 flu vaccine...  He had f/u DrDahlstadt 8/11 w/ 12 Testopel implants placed & due for f/u Testosterone level next month...   ~  October 04, 2010:  63mo ROV & doing satis> just had left knee arthroscopic surg by DrMurphy & already feeling better;  He saw [facility] 10/11 for check up> doing well, no changes made, f/u 30yr...  Hasn't been able to lose wt & we reviewed diet + exercise prescription...  See fasting labs below...  Wants refills Xanax & Vicodin (lim to 3/d max).  ~  April 04, 2011:  63mo ROV & he reports doing well, needs Vicodin & Alpraz refilled, had Flu shot at work...    OSA> he had an AHI=82 in 2010 & started on CPAP20 by [facility] & he continues follow up there (we do not have notes)...    Hx  mild asthma> he still smokes an occas cigar & asked to quit completely!!! He denies CP, palpit, SOB, edema...    HBP> on ExforgeHCT & K+; BP today= 124/76 & similar at work; he remains asymptomatic...    Hyperlipid> on Simva40 + Niaspan; last FLP 4/12 showed Chol ok but incr TG & he understands that improvement here requires him to lose weight!    Obesity> wt= 314# which is up 5# over the last 63mo; we reviewed diet/ exercise/ wt reduction program...    GI> GERD, Rectal fissure> on Prilosec20 & AnusolHC as needed; he is overdue for a f/u colonoscopy & will contact GI for this screening procedure...    GU> Low-T & ED>  He takes a bunch of vits & herbs including Chromium, cinnamon, vinegar, MVI, etc; DrDahlstedt treats him w/ Testopel implant...    DJD> he uses OTC analgesics and Vicodin as needed...    Depression> he remains on Zoloft25 & Xanax as needed...  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 it got worse and was sent to DrWolicki who hospitalized him 3/08 w/ MRSA infection- Rx's w/ Vancomycin infusions... now resolved.  OBSTRUCTIVE SLEEP APNEA (ICD-327.23) - Apr10> had Sleep Study at [facility]> pos for OSA w/ AHI= 82, desat to 85%... split night study w/ good control on CPAP 20 (see report)... started on CPAP by [facility] & still followed by him...  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 no recent exac and no regular meds... told to discontinue all smoking> denies much cough, sputum, dyspnea, CP, etc...  HYPERTENSION (ICD-401.9) - followed by DrGollan/ [facility]> on ECASA 325mg /d &  EXFORGE 10/320/25 daily + KCl 35mEq/d... he had CP while hosp w/ the MRSA 2008 and DrWolicki consulted DrMcQueen who did a NuclearStressTest 3/08 which was normal... and a 2DEcho which showed mild MR and nl LVF... ~ 10/12:  f/u visit DrGollan at [facility]> note reviewed, no changes made, DrGollan desperately wants him to lose weight! ~  10/12:  BP= 124/76 doing well, remains asymptomatic... ~  4/13:  BP= 120/78 & he denies CP, palpit, SOB, edema, etc...  HYPERLIPIDEMIA (ICD-272.4) - he's been switched around by [facility] betw Simva/ Caduet/ back to SIMVASTATIN 40mg /d now + NIACIN 500mg Bid + FISH OIL 1000mg Bid...  still not on any kind of diet but his weight is stable at 294#.Marland Kitchen. ~  FLP 1/08 showed TChol 195, TG 119, HDL 41, LDL 131 ~  FLP 2/09 showed TChol 214, TG 214, HDL 39, LDL 127 ~  12/09: recent labs by [facility]- we don't have these results... ~  FLP 5/10 on 3 meds above showed TChol 148, TG 81, HDL 48, LDL 84 ~  FLP 11/10 on 3 meds showed TChol 135, TG 145, HDL 36, LDL 70 ~  FLP 5/11 showed TChol 145, TG 128, HDL 43, LDL 77... keep same Rx. ~  FLP 10/11 showed TChol 152, TG 198, HDL 41, LDL 72... same meds, better diet, get wt down! ~  FLP 4/12 showed TChol 141, TG 200, HDL 39, LDL 62... Similar> needs better low fat diet, get wt down. ~  10/12:  FLP not rechecked today as he has not lost wt etc... ~  FLP 4/13 on Simva40+Niasp1000 showed TChol 137, TG 124, HDL 43, LDL 69  MORBID OBESITY (ICD-278.01) - despite all efforts weight remains >300#... discussed wt watchers, nutrition center, poss bariatric approach... he's not been able to exercise but will join the Y & start diet program! ~  5/11:  he reports that DrElkins gave him 30d supply of Phenteramine which really helped- lost 20#, but put it all back + 9# more off this med... we discussed the dangers of diet pills & rec diet + exercise, neither of which he has tried in earnest! ~  10/11:  weight up 15# to 314# today> discussed diet, exercise,  etc... ~  4/12:  Weight = 309# and needs to do better w/ diet/ exercise... ~  10/12:  Weight = 314# and we reviewed diet, exercise, wt loss program... ~  4/13:  Weight = 311#  GERD (ICD-530.81) - he had an EGD from [facility] 4/03 which was neg... he takes OMEPRAZOLE 20mg /d for reflux symptoms which works well...  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 prev tried Diltiazem cream, now ANUSOL [facility] etc... ~  pt reports colonoscopy by [facility] 7/03- reported neg & f/u planned 5 yrs> he is overdue & will call for f/u exam...  TESTOSTERONE DEFICIENCY (ICD-257.2) &  ERECTILE DYSFUNCTION (ICD-302.72) - prev on TESTIM Gel 1%= 5gm daily and followed by DrDahlstadt> changed to Testopel Implants, but he tells me these are back ordered & he is currently getting Testos shots thru the Urology office... also takes Viagra Prn...  ~  labs 5/10 showed PSA = 0.85 ~  labs 5/11 showed PSA= 0.65 ~  Labs 4/12 showed PSA= 0.79 ~  Labs 4/13 showed PSA= 1.44  OSTEOARTHRITIS (ICD-715.90) DEGENERATIVE DISC DISEASE (ICD-722.6) - followed by DrMurphy for right knee pain... prev seen by Mayford Knife for DDD and LBP... ~  s/p left CTS surg 2009 ~  s/p right knee arthroscopy by DrMurphy 2/10... also had cortisone shot in knee 4/10 ~  s/p trigger finger injections by DrGramig ~  4/12:  s/p left knee arthroscopy by DrMurphy> improved...  DYSTHYMIA (ICD-300.4) - some stress, some depression which is mild by his determination... he tried Lexapro last year but didn't follow up... now using Xanax as needed, and started ZOLOFT 25mg /d in Feb10- he reports better, wants to continue this dose.  HEALTH MAINTENANCE: ~  GI:  He saw [facility] in 2003 & long overdue for f/u colonoscopy- he promises to call for f/u exam. ~  GU:  Followed by DrDahlstedt as above... ~  Immuniz:  He gets Flu shot each yr at work; he had TDAP at work 2013...   Past Surgical History  Procedure Date  . Sphincterotomy    . Shoulder arthroscopy     right  . Other surgical history     arthroscopic subacromial decompression  . Other surgical history     open resection, distal right clavicle  . Knee surgery 09/2010    left knee    =>  s/p left knee arthroscopy 4/12 by DrMurphy      Outpatient Encounter Prescriptions as of 10/03/2011  Medication Sig Dispense Refill  . ALPRAZolam (XANAX) 0.5 MG tablet Take 1 tablet (0.5 mg total) by mouth 3 (three) times daily as needed. For nerves  90 tablet  5  . Apple Cider Vinegar 500 MG TABS Take by mouth. 4 tsp       . aspirin 81 MG tablet Take 81 mg by mouth daily.        . Chromium Picolinate 200 MCG TABS Take 1 tablet by mouth daily.        . Cinnamon 500 MG capsule Take 1,000 mg by mouth daily.       Marland Kitchen EXFORGE HCT 10-320-25 MG TABS TAKE 1 TABLET BY MOUTH ONCE A DAY  90 tablet  4  . Ginkgo Biloba 100 MG CAPS Take 1 capsule by mouth daily.        Marland Kitchen HYDROcodone-acetaminophen (VICODIN) 5-500 MG per tablet Take one tablet by mouth every 6-8 hours as needed NOT TO EXCEED 3 PER DAY  90 tablet  5  . hydrocortisone (PROCTOSOL HC) 2.5 % rectal cream Apply as directed after each BM       . Multiple Vitamin (MULTIVITAMIN) tablet Take 1 tablet by mouth daily.        . niacin 500 MG CR capsule Take 500 mg by mouth 2 (two) times daily.       . Omega-3 Fatty Acids (FISH OIL  TRIPLE STRENGTH) 1400 MG CAPS Take 1 capsule by mouth 2 (two) times daily.        Marland Kitchen omeprazole (PRILOSEC) 20 MG capsule TAKE 1 CAPSULE BY MOUTH ONCE A DAY  90 capsule  4  . Potassium Gluconate 550 MG TABS Take 1 tablet by mouth daily.        . sertraline (ZOLOFT) 25 MG tablet TAKE 1 TABLET BY MOUTH EVERY DAY  90 tablet  2  . simvastatin (ZOCOR) 40 MG tablet TAKE 1 TABLET BY MOUTH AT BEDTIME  90 tablet  4  . VIAGRA 100 MG tablet USE AS DIRECTED  10 tablet  5  . Alum & Mag Hydroxide-Simeth (MAGIC MOUTHWASH) SOLN 1 tsp swish and swallow four times a day.  120 mL  0    No Known Allergies   Current  Medications, Allergies, Past Medical History, Past Surgical History, Family History, and Social History were reviewed in [employer] record.    Review of Systems         See HPI - all other systems neg except as noted... The patient complains of dyspnea on exertion.  The patient denies anorexia, fever, weight loss, weight gain, vision loss, decreased hearing, hoarseness, chest pain, syncope, peripheral edema, prolonged cough, headaches, hemoptysis, abdominal pain, melena, hematochezia, severe indigestion/heartburn, hematuria, incontinence, muscle weakness, suspicious skin lesions, transient blindness, difficulty walking, depression, unusual weight change, abnormal bleeding, enlarged lymph nodes, and angioedema.     Objective:   Physical Exam     WD, Obese, 61 y/o WM in NAD... GENERAL:  Alert & oriented; pleasant & cooperative. HEENT:  [place]/AT, EOM-wnl, PERRLA, EACs-clear, TMs-wnl, NOSE-clear, THROAT-clear & wnl. NECK:  Supple w/ fairROM; no JVD; normal carotid impulses w/o bruits; no thyromegaly or nodules palpated; no lymphadenopathy. CHEST:  Clear to P & A; without wheezes/ rales/ or rhonchi. HEART:  Regular Rhythm; gr 1/6 SEM, without rubs or gallops... ABDOMEN:  Obese soft & nontender; normal bowel sounds; no organomegaly or masses detected.  EXT: without deformities, mild arthritic changes; no varicose veins/ +venous insuffic/ no edema.right knee w/ ace wrap.  NEURO:  CN's intact; no focal neuro deficits... DERM:  Testopel Implants palpated in his right lat butt cheek area, no infection or drainage seen.  RADIOLOGY DATA:  Reviewed in the EPIC EMR & discussed w/ the patient...  LABORATORY DATA:  Reviewed in the EPIC EMR & discussed w/ the patient...   Assessment & Plan:   OSA>  On CPAP per drKelly & pt informs me he has f/u appt soon...  Asthma>  Still smoking cigars & needs to quit, we discussed smoking cessation but he doesn't want med help etc...  HBP>   Controlled on the exforgeHCT Rx, he likes the one pill;  discussed the need to lose wt, no salt, etc...  Hyperlipidemia>  FLPs reviewed> if he wants to improve these numbers MUST get on diet & get wt down!!!  OBESITY>  As above!!!  GU>  Per DrDahlstedt on Testopel implants, now Testos shots...  DJD>  S/p arthroscopic surg left knee by DrMurphy & feeling better already...   Patient's Medications  New Prescriptions   CLOTRIMAZOLE-BETAMETHASONE (LOTRISONE) CREAM    Apply to affected area 2 times daily  Previous Medications   ALUM & MAG HYDROXIDE-SIMETH (MAGIC MOUTHWASH) SOLN    1 tsp swish and swallow four times a day.   APPLE CIDER VINEGAR 500 MG TABS    Take by mouth. 4 tsp    ASPIRIN 81 MG  TABLET    Take 81 mg by mouth daily.     CHROMIUM PICOLINATE 200 MCG TABS    Take 1 tablet by mouth daily.     CINNAMON 500 MG CAPSULE    Take 1,000 mg by mouth daily.    EXFORGE HCT 10-320-25 MG TABS    TAKE 1 TABLET BY MOUTH ONCE A DAY   GINKGO BILOBA 100 MG CAPS    Take 1 capsule by mouth daily.     HYDROCORTISONE (PROCTOSOL HC) 2.5 % RECTAL CREAM    Apply as directed after each BM    MULTIPLE VITAMIN (MULTIVITAMIN) TABLET    Take 1 tablet by mouth daily.     NIACIN 500 MG CR CAPSULE    Take 500 mg by mouth 2 (two) times daily.    OMEGA-3 FATTY ACIDS (FISH OIL TRIPLE STRENGTH) 1400 MG CAPS    Take 1 capsule by mouth 2 (two) times daily.     OMEPRAZOLE (PRILOSEC) 20 MG CAPSULE    TAKE 1 CAPSULE BY MOUTH ONCE A DAY   POTASSIUM GLUCONATE 550 MG TABS    Take 1 tablet by mouth daily.     SERTRALINE (ZOLOFT) 25 MG TABLET    TAKE 1 TABLET BY MOUTH EVERY DAY   SIMVASTATIN (ZOCOR) 40 MG TABLET    TAKE 1 TABLET BY MOUTH AT BEDTIME  Modified Medications   Modified Medication Previous Medication   ALPRAZOLAM (XANAX) 0.5 MG TABLET ALPRAZolam (XANAX) 0.5 MG tablet      Take 1 tablet (0.5 mg total) by mouth 3 (three) times daily as needed. For nerves    Take 1 tablet (0.5 mg total) by mouth 3 (three) times  daily as needed. For nerves   HYDROCODONE-ACETAMINOPHEN (VICODIN) 5-500 MG PER TABLET HYDROcodone-acetaminophen (VICODIN) 5-500 MG per tablet      Take one tablet by mouth every 6-8 hours as needed NOT TO EXCEED 3 PER DAY    Take one tablet by mouth every 6-8 hours as needed NOT TO EXCEED 3 PER DAY   SILDENAFIL (VIAGRA) 100 MG TABLET VIAGRA 100 MG tablet      Take as directed    USE AS DIRECTED  Discontinued Medications   No medications on file

## 2011-10-03 NOTE — Patient Instructions (Signed)
Today we updated your med list in our EPIC system...    Continue your current medications the same...  Today we did your follow up FASTING blood work...    We will call you w/ the results in a few days...  Let's get on track w/ our diet & exercise program...    You will definitely feel beeter if you get your weight down!  Call for any problems...  Let's plan a follow up visit in 6 months.Marland KitchenMarland Kitchen

## 2011-10-05 NOTE — Progress Notes (Signed)
Quick Note:  Pt aware of results. ______ 

## 2012-01-06 ENCOUNTER — Other Ambulatory Visit: Payer: Self-pay | Admitting: Adult Health

## 2012-03-12 ENCOUNTER — Encounter: Payer: Self-pay | Admitting: Cardiovascular Disease

## 2012-03-12 ENCOUNTER — Ambulatory Visit (INDEPENDENT_AMBULATORY_CARE_PROVIDER_SITE_OTHER): Payer: BC Managed Care – PPO | Admitting: Cardiovascular Disease

## 2012-03-12 VITALS — BP 132/78 | HR 86 | Ht 72.0 in | Wt 315.5 lb

## 2012-03-12 DIAGNOSIS — I10 Essential (primary) hypertension: Secondary | ICD-10-CM

## 2012-03-12 DIAGNOSIS — E785 Hyperlipidemia, unspecified: Secondary | ICD-10-CM

## 2012-03-12 DIAGNOSIS — G4733 Obstructive sleep apnea (adult) (pediatric): Secondary | ICD-10-CM

## 2012-03-12 NOTE — Assessment & Plan Note (Signed)
Currently he wears CPAP nightly.

## 2012-03-12 NOTE — Assessment & Plan Note (Signed)
Cholesterol is at goal on the current lipid regimen. No changes to the medications were made.  

## 2012-03-12 NOTE — Progress Notes (Signed)
Patient ID: Dakota Ellison, male    DOB: 01-18-1951, 61 y.o.   MRN: [id number]  HPI Comments: Dakota Ellison is a pleasant 61 year old gentleman with a history of obesity, obstructive sleep apnea, hypertension, hyperlipidemia who presents for routine followup.   He continues to struggle with his weight.  He stopped doing any significant exercise. He knows that he needs to lose weight but he has not been particularly motivated. He denies any significant shortness of breath or chest pain He is able to tolerate his CPAP.   Last echocardiogram in March 2008 showing normal systolic function, right ventricle mildly dilated, mild mitral regurg, mild tricuspid regurg.   Last stress test in March 2008 where he achieved 10 mets, exercised for 9 minutes, no ischemia noted. Overall a normal,  low-risk scan.   EKG shows normal sinus rhythm with rate 86 beats per minute, no significant ST-T wave changes    Outpatient Encounter Prescriptions as of 03/12/2012  Medication Sig Dispense Refill  . ALPRAZolam (XANAX) 0.5 MG tablet Take 1 tablet (0.5 mg total) by mouth 3 (three) times daily as needed. For nerves  90 tablet  5  . Alum & Mag Hydroxide-Simeth (MAGIC MOUTHWASH) SOLN 1 tsp swish and swallow four times a day.  120 mL  0  . Apple Cider Vinegar 500 MG TABS Take by mouth. 4 tsp       . aspirin 81 MG tablet Take 81 mg by mouth daily.        . Chromium Picolinate 200 MCG TABS Take 1 tablet by mouth daily.        . Cinnamon 500 MG capsule Take 1,000 mg by mouth daily.       . clotrimazole-betamethasone (LOTRISONE) cream Apply to affected area 2 times daily  15 g  5  . EXFORGE HCT 10-320-25 MG TABS TAKE 1 TABLET BY MOUTH ONCE A DAY  90 tablet  4  . Ginkgo Biloba 100 MG CAPS Take 1 capsule by mouth daily.        Marland Kitchen HYDROcodone-acetaminophen (VICODIN) 5-500 MG per tablet Take one tablet by mouth every 6-8 hours as needed NOT TO EXCEED 3 PER DAY  90 tablet  5  . hydrocortisone (PROCTOSOL HC) 2.5 % rectal cream Apply  as directed after each BM       . Multiple Vitamin (MULTIVITAMIN) tablet Take 1 tablet by mouth daily.        . niacin 500 MG CR capsule Take 500 mg by mouth 2 (two) times daily.       . Omega-3 Fatty Acids (FISH OIL) 1200 MG CAPS Take 1,200 mg by mouth 2 (two) times daily.      Marland Kitchen omeprazole (PRILOSEC) 20 MG capsule TAKE 1 CAPSULE BY MOUTH ONCE A DAY  90 capsule  4  . Potassium Gluconate 550 MG TABS Take 1 tablet by mouth daily.        . sertraline (ZOLOFT) 25 MG tablet TAKE 1 TABLET BY MOUTH EVERY DAY  90 tablet  2  . sildenafil (VIAGRA) 100 MG tablet Take as directed  10 tablet  5  . simvastatin (ZOCOR) 40 MG tablet TAKE 1 TABLET BY MOUTH AT BEDTIME  90 tablet  4  . testosterone enanthate (DELATESTRYL) 200 MG/ML injection Inject into the muscle every 14 (fourteen) days. For IM use only      . DISCONTD: Omega-3 Fatty Acids (FISH OIL TRIPLE STRENGTH) 1400 MG CAPS Take 1 capsule by mouth 2 (two) times daily.  Review of Systems  Constitutional: Negative.   HENT: Negative.   Eyes: Negative.   Respiratory: Negative.   Cardiovascular: Negative.   Gastrointestinal: Negative.   Musculoskeletal: Negative.   Skin: Negative.   Neurological: Negative.   Hematological: Negative.   Psychiatric/Behavioral: Negative.   All other systems reviewed and are negative.    BP 132/78  Pulse 86  Ht 6' (1.829 m)  Wt 315 lb 8 oz (143.11 kg)  BMI 42.79 kg/m2  Physical Exam  Nursing note and vitals reviewed. Constitutional: He is oriented to person, place, and time. He appears well-developed and well-nourished.       obese  HENT:  Head: Normocephalic.  Nose: Nose normal.  Mouth/Throat: Oropharynx is clear and moist.  Eyes: Conjunctivae normal are normal. Pupils are equal, round, and reactive to light.  Neck: Normal range of motion. Neck supple. No JVD present.  Cardiovascular: Normal rate, regular rhythm, S1 normal, S2 normal, normal heart sounds and intact distal pulses.  Exam reveals no  gallop and no friction rub.   No murmur heard. Pulmonary/Chest: Effort normal and breath sounds normal. No respiratory distress. He has no wheezes. He has no rales. He exhibits no tenderness.  Abdominal: Soft. Bowel sounds are normal. He exhibits no distension. There is no tenderness.  Musculoskeletal: Normal range of motion. He exhibits no edema and no tenderness.  Lymphadenopathy:    He has no cervical adenopathy.  Neurological: He is alert and oriented to person, place, and time. Coordination normal.  Skin: Skin is warm and dry. No rash noted. No erythema.  Psychiatric: He has a normal mood and affect. His behavior is normal. Judgment and thought content normal.           Assessment and Plan

## 2012-03-12 NOTE — Assessment & Plan Note (Signed)
We have encouraged continued exercise, careful diet management in an effort to lose weight. 

## 2012-03-12 NOTE — Patient Instructions (Addendum)
You are doing well. No medication changes were made.  Please call us if you have new issues that need to be addressed before your next appt.  Your physician wants you to follow-up in: 6 months.  You will receive a reminder letter in the mail two months in advance. If you don't receive a letter, please call our office to schedule the follow-up appointment.   

## 2012-03-12 NOTE — Assessment & Plan Note (Signed)
Blood pressure is well controlled on today's visit. No changes made to the medications. 

## 2012-03-28 ENCOUNTER — Encounter: Payer: Self-pay | Admitting: *Deleted

## 2012-04-02 ENCOUNTER — Ambulatory Visit (INDEPENDENT_AMBULATORY_CARE_PROVIDER_SITE_OTHER): Payer: BC Managed Care – PPO | Admitting: Pulmonary Disease

## 2012-04-02 ENCOUNTER — Other Ambulatory Visit (INDEPENDENT_AMBULATORY_CARE_PROVIDER_SITE_OTHER): Payer: BC Managed Care – PPO

## 2012-04-02 ENCOUNTER — Encounter: Payer: Self-pay | Admitting: Pulmonary Disease

## 2012-04-02 VITALS — BP 118/76 | HR 92 | Temp 98.3°F | Ht 72.0 in | Wt 322.2 lb

## 2012-04-02 DIAGNOSIS — F341 Dysthymic disorder: Secondary | ICD-10-CM

## 2012-04-02 DIAGNOSIS — K219 Gastro-esophageal reflux disease without esophagitis: Secondary | ICD-10-CM

## 2012-04-02 DIAGNOSIS — I10 Essential (primary) hypertension: Secondary | ICD-10-CM

## 2012-04-02 DIAGNOSIS — IMO0002 Reserved for concepts with insufficient information to code with codable children: Secondary | ICD-10-CM

## 2012-04-02 DIAGNOSIS — E785 Hyperlipidemia, unspecified: Secondary | ICD-10-CM

## 2012-04-02 DIAGNOSIS — K602 Anal fissure, unspecified: Secondary | ICD-10-CM

## 2012-04-02 DIAGNOSIS — M1990 Unspecified osteoarthritis, unspecified site: Secondary | ICD-10-CM

## 2012-04-02 DIAGNOSIS — E291 Testicular hypofunction: Secondary | ICD-10-CM

## 2012-04-02 DIAGNOSIS — G4733 Obstructive sleep apnea (adult) (pediatric): Secondary | ICD-10-CM

## 2012-04-02 LAB — BASIC METABOLIC PANEL
CO2: 30 mEq/L (ref 19–32)
Calcium: 9.5 mg/dL (ref 8.4–10.5)
Chloride: 103 mEq/L (ref 96–112)
Creatinine, Ser: 1 mg/dL (ref 0.4–1.5)
Glucose, Bld: 106 mg/dL — ABNORMAL HIGH (ref 70–99)

## 2012-04-02 MED ORDER — ALPRAZOLAM 0.5 MG PO TABS: 0.5000 mg | ORAL_TABLET | Freq: Three times a day (TID) | ORAL | Status: DC | PRN

## 2012-04-02 MED ORDER — HYDROCODONE-ACETAMINOPHEN 5-500 MG PO TABS: ORAL_TABLET | ORAL | Status: DC

## 2012-04-02 NOTE — Progress Notes (Signed)
Subjective:    Patient ID: Dakota Ellison, male    DOB: 07-25-1950, 61 y.o.   MRN: [id number]  HPI 61 y/o WM here for an add-on visit... he has multiple medical problems as noted below...    ~  Oct 05, 2009:  his head is now shaved, works for [employer], "leaves nothing to grab"...    He saw DrGollan at [facility] for f/u HBP, Hyperlipid, Obesity & OSA- BP controlled on med, due for f/u FLP on his 3 meds, still sees [facility] who wrote for his CPAP...   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 I refused this & explained why> rec diet + exercise...   Saw DrDahlstadt 4/11 for his Hypogonadism- he had Testopel Implants but 1 pellet site got infected & 2 pellets popped out... Rx by Urology, they plan f/u Testosterone level.  ~  April 05, 2010:  he's had a good 63mo but unfortunately has gained 15# up to 314# & we discussed renewed diet + exercise efforts for weight reduction... BP controlled on Exforge;  denies CP, palpit, ch in DOE, etc;  FLP not quite as good w/ the wt gain & reminded to take the Simva4,0 Niacin, & FishOil daily... requests refill Vocodin & Rebeca Allegra today;  already had the 2010 flu vaccine...  He had f/u DrDahlstadt 8/11 w/ 12 Testopel implants placed & due for f/u Testosterone level next month...   ~  October 04, 2010:  63mo ROV & doing satis> just had left knee arthroscopic surg by DrMurphy & already feeling better;  He saw [facility] 10/11 for check up> doing well, no changes made, f/u 30yr...  Hasn't been able to lose wt & we reviewed diet + exercise prescription...  See fasting labs below...  Wants refills Xanax & Vicodin (lim to 3/d max).  ~  April 04, 2011:  63mo ROV & he reports doing well, needs Vicodin & Alpraz refilled, had Flu shot at work...    OSA> he had an AHI=82 in 2010 & started on CPAP20 by [facility] & he continues follow up there (we do not have notes)...    Hx  mild asthma> he still smokes an occas cigar & asked to quit completely!!! He denies CP, palpit, SOB, edema...    HBP> on ExforgeHCT & K+; BP today= 124/76 & similar at work; he remains asymptomatic...    Hyperlipid> on Simva40 + Niaspan; last FLP 4/12 showed Chol ok but incr TG & he understands that improvement here requires him to lose weight!    Obesity> wt= 314# which is up 5# over the last 63mo; we reviewed diet/ exercise/ wt reduction program...    GI> GERD, Rectal fissure> on Prilosec20 & AnusolHC as needed; he is overdue for a f/u colonoscopy & will contact GI for this screening procedure...    GU> Low-T & ED>  He takes a bunch of vits & herbs including Chromium, cinnamon, vinegar, MVI, etc; DrDahlstedt treats him w/ Testopel implant...    DJD> he uses OTC analgesics and Vicodin as needed...    Depression> he remains on Zoloft25 & Xanax as needed...  ~  October 03, 2011:  63mo ROV & Dakota Ellison is doing reasonably well- he notes an intertrig rash on abd & we will Rx w/ Lotrisone cream;  We reviewed prob list, meds, xrays & labs> see below>> LABS 4/13:  FLP- at goals on Simva40;  Chems- ok w/ BS=106;  CBC- wnl;  TSH=2.20;  PSA=1.44  ~  October 28. 2013:  60mo ROV & Dakota Ellison is noting increased difficulty getting about (stiff joints) but his wt is up to 322# & he knows the critically important need to lose wt.     OSA> he had an AHI=82 in 2010 & started on CPAP20 by [facility], [facility] & he continues follow up there (we do not have notes)...    Hx mild asthma> he still smokes an occas cigar & asked to quit completely!!! He states no attacks in yrs & breathing is OK- denies CP, palpit, SOB, edema...    HBP> on ExforgeHCT & K+; BP today= 118/76 & similar at work; he remains asymptomatic...    Hyperlipid> on Simva40 + Niaspan; last FLP 4/13 looked good; he understands that improvement here requires him to lose weight!    Obesity> wt= 322# which is up 8# over the last 60mo; we reviewed diet/ exercise/ wt reduction  program...    GI> GERD, Rectal fissure> on Prilosec20 & AnusolHC as needed; he is overdue for a f/u colonoscopy & will contact GI for this screening procedure...    GU> Low-T & ED>  He takes a bunch of vits & herbs including chromium, cinnamon, vinegar, MVI, etc; DrDahlstedt treats him w/ Testos shots & Viagra.    DJD> he uses OTC analgesics and Vicodin as needed...    Depression> he remains on Zoloft25 & Xanax as needed... We reviewed prob list, meds, xrays and labs> see below for updates >> he's already had the 2013 Flu vaccine; requests Vicodin & Xanax refills today. LABS 10/13:  Chems- ok w/ BS106...          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 it got worse and was sent to DrWolicki who hospitalized him 3/08 w/ MRSA infection- Rx's w/ Vancomycin infusions... now resolved.  OBSTRUCTIVE SLEEP APNEA (ICD-327.23) - Apr10> had Sleep Study at [facility]> pos for OSA w/ AHI= 82, desat to 85%... split night study w/ good control on CPAP 20 (see report)... started on CPAP by [facility] & still followed by him...  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 no recent exac and no regular meds... told to discontinue all smoking> denies much cough, sputum, dyspnea, CP, etc... ~  CXR 2/09 showed normal heart size, clear lungs, degen changes in Tspine, NAD...   HYPERTENSION (ICD-401.9) - followed by DrGollan/ [facility]> on ECASA 325mg /d & EXFORGE 10/320/25 daily + KCl 62mEq/d... he had CP while hosp w/ the MRSA 2008 and DrWolicki consulted DrMcQueen who did a NuclearStressTest 3/08 which was normal... and a 2DEcho which showed mild MR and nl LVF... ~ 10/12:  f/u visit DrGollan at [facility]> note reviewed, no changes made, DrGollan desperately wants him to lose weight! ~  10/12:  BP= 124/76 doing well, remains asymptomatic... ~  4/13:  BP= 120/78 & he denies CP, palpit,  SOB, edema, etc... ~  10/13:  BP= 118/76 & he remains asymptomatic; EKG showed NSR, rate86, wnl, NAD...  HYPERLIPIDEMIA (ICD-272.4) - he's been switched around by [facility] betw Simva/ Caduet/ back to SIMVASTATIN 40mg /d now + NIACIN 500mg Bid + FISH OIL 1000mg Bid...  still not on any kind of diet but his weight is stable at 294#.Marland Kitchen. ~  FLP 1/08 showed TChol 195, TG 119, HDL 41, LDL 131 ~  FLP 2/09 showed TChol 214, TG 214, HDL 39, LDL 127 ~  12/09: recent labs by [facility]- we don't have these results... ~  FLP 5/10 on 3 meds above showed TChol 148, TG 81, HDL 48, LDL 84 ~  FLP 11/10 on 3 meds showed TChol 135, TG 145, HDL 36, LDL 70 ~  FLP 5/11 showed TChol 145, TG 128, HDL 43, LDL 77... keep same Rx. ~  FLP 10/11 showed TChol 152, TG 198, HDL 41, LDL 72... same meds, better diet, get wt down! ~  FLP 4/12 showed TChol 141, TG 200, HDL 39, LDL 62... Similar> needs better low fat diet, get wt down. ~  10/12:  FLP not rechecked today as he has not lost wt etc... ~  FLP 4/13 on Simva40+Niasp1000 showed TChol 137, TG 124, HDL 43, LDL 69  MORBID OBESITY (ICD-278.01) - despite all efforts weight remains >300#... discussed wt watchers, nutrition center, poss bariatric approach... he's not been able to exercise but will join the Y & start diet program! ~  5/11:  he reports that DrElkins gave him 30d supply of Phenteramine which really helped- lost 20#, but put it all back + 9# more off this med... we discussed the dangers of diet pills & rec diet + exercise, neither of which he has tried in earnest! ~  10/11:  weight up 15# to 314# today> discussed diet, exercise, etc... ~  4/12:  Weight = 309# and needs to do better w/ diet/ exercise... ~  10/12:  Weight = 314# and we reviewed diet, exercise, wt loss program... ~  4/13:  Weight = 311# ~  10/13:  Weight = 322#  GERD (ICD-530.81) - he had an EGD from [facility] 4/03 which was neg... he takes OMEPRAZOLE 20mg /d for reflux symptoms which works well...  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 prev tried Diltiazem cream, now ANUSOL [facility] etc... ~  pt reports colonoscopy by [facility] 7/03- reported neg & f/u planned 5 yrs> he is overdue & will call for f/u exam...  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 also takes Viagra Prn...  ~  labs 5/10 showed PSA = 0.85 ~  labs 5/11 showed PSA= 0.65 ~  Labs 4/12 showed PSA= 0.79 ~  Labs 4/13 showed PSA= 1.44 ~  9/13: seen by DrDahlstedt> treated for hypogonadism w/ Testopel, then Testos shots but he was irregular w/ his visits; prostate exam was wnl; they tried to teach him self injected Testos Q2weeks...   OSTEOARTHRITIS (ICD-715.90) DEGENERATIVE DISC DISEASE (ICD-722.6) - followed by DrMurphy for right knee pain... prev seen by Mayford Knife for DDD and LBP... ~  s/p left CTS surg 2009 ~  s/p right knee arthroscopy by DrMurphy 2/10... also had cortisone shot in knee 4/10 ~  s/p trigger finger injections by DrGramig ~  4/12:  s/p left knee arthroscopy by DrMurphy> improved...  DYSTHYMIA (ICD-300.4) - some stress, some depression which is mild by his determination... he tried Lexapro last year but didn't follow up... now using Xanax as needed, and started ZOLOFT 25mg /d in Feb10- he reports better, wants to continue this dose.  HEALTH MAINTENANCE: ~  GI:  He saw [facility] in 2003 & long overdue for f/u colonoscopy- he promises to call for f/u exam. ~  GU:  Followed by DrDahlstedt as above... ~  Immuniz:  He gets Flu shot each yr at work; he had TDAP at work 2013...   Past Surgical History  Procedure Date  . Sphincterotomy   . Shoulder arthroscopy     right  . Other surgical history     arthroscopic subacromial decompression  . Other surgical history       open resection, distal right clavicle  . Knee surgery 09/2010    left knee    =>  s/p left knee arthroscopy 4/12 by DrMurphy      Outpatient Encounter Prescriptions as of 04/02/2012  Medication Sig Dispense Refill  . ALPRAZolam (XANAX) 0.5 MG tablet Take 1 tablet (0.5 mg total) by mouth 3 (three) times daily as needed. For nerves  90 tablet  5  . Alum & Mag Hydroxide-Simeth (MAGIC MOUTHWASH) SOLN 1 tsp swish and swallow four times a day.  120 mL  0  . Apple Cider Vinegar 500 MG TABS Take by mouth. 4 tsp       . aspirin 81 MG tablet Take 81 mg by mouth daily.        . Chromium Picolinate 200 MCG TABS Take 1 tablet by mouth daily.        . Cinnamon 500 MG capsule Take 1,000 mg by mouth daily.       . clotrimazole-betamethasone (LOTRISONE) cream Apply to affected area 2 times daily  15 g  5  . EXFORGE HCT 10-320-25 MG TABS TAKE 1 TABLET BY MOUTH ONCE A DAY  90 tablet  4  . Ginkgo Biloba 100 MG CAPS Take 1 capsule by mouth daily.        Marland Kitchen HYDROcodone-acetaminophen (VICODIN) 5-500 MG per tablet Take one tablet by mouth every 6-8 hours as needed NOT TO EXCEED 3 PER DAY  90 tablet  5  . hydrocortisone (PROCTOSOL HC) 2.5 % rectal cream Apply as directed after each BM       . Multiple Vitamin (MULTIVITAMIN) tablet Take 1 tablet by mouth daily.        . niacin 500 MG CR capsule Take 500 mg by mouth 2 (two) times daily.       . Omega-3 Fatty Acids (FISH OIL) 1200 MG CAPS Take 1,200 mg by mouth 2 (two) times daily.      Marland Kitchen omeprazole (PRILOSEC) 20 MG capsule TAKE 1 CAPSULE BY MOUTH ONCE A DAY  90 capsule  4  . Potassium Gluconate 550 MG TABS Take 1 tablet by mouth daily.        . sertraline (ZOLOFT) 25 MG tablet TAKE 1 TABLET BY MOUTH EVERY DAY  90 tablet  2  . sildenafil (VIAGRA) 100 MG tablet Take as directed  10 tablet  5  . simvastatin (ZOCOR) 40 MG tablet TAKE 1 TABLET BY MOUTH AT BEDTIME  90 tablet  4  . testosterone enanthate (DELATESTRYL) 200 MG/ML injection Inject into the muscle every 14  (fourteen) days. For IM use only        No Known Allergies   Current Medications, Allergies, Past Medical History, Past Surgical History, Family History, and Social History were reviewed in [employer] record.    Review of Systems         See HPI - all other systems neg except as noted... The patient complains of dyspnea on exertion.  The patient denies anorexia, fever, weight loss, weight gain, vision loss, decreased hearing, hoarseness, chest pain, syncope, peripheral edema,  prolonged cough, headaches, hemoptysis, abdominal pain, melena, hematochezia, severe indigestion/heartburn, hematuria, incontinence, muscle weakness, suspicious skin lesions, transient blindness, difficulty walking, depression, unusual weight change, abnormal bleeding, enlarged lymph nodes, and angioedema.     Objective:   Physical Exam     WD, Obese, 61 y/o WM in NAD... GENERAL:  Alert & oriented; pleasant & cooperative. HEENT:  [place]/AT, EOM-wnl, PERRLA, EACs-clear, TMs-wnl, NOSE-clear, THROAT-clear & wnl. NECK:  Supple w/ fairROM; no JVD; normal carotid impulses w/o bruits; no thyromegaly or nodules palpated; no lymphadenopathy. CHEST:  Clear to P & A; without wheezes/ rales/ or rhonchi. HEART:  Regular Rhythm; gr 1/6 SEM, without rubs or gallops... ABDOMEN:  Obese soft & nontender; normal bowel sounds; no organomegaly or masses detected.  EXT: without deformities, mild arthritic changes; no varicose veins/ +venous insuffic/ no edema.right knee w/ ace wrap.  NEURO:  CN's intact; no focal neuro deficits... DERM:  Testopel Implants palpated in his right lat butt cheek area, no infection or drainage seen.  RADIOLOGY DATA:  Reviewed in the EPIC EMR & discussed w/ the patient...  LABORATORY DATA:  Reviewed in the EPIC EMR & discussed w/ the patient...   Assessment & Plan:    OSA>  On CPAP per [facility] & pt informs me he has f/u appt soon...  Asthma>  Still smoking cigars & needs to quit,  we discussed smoking cessation but he doesn't want med help etc...  HBP>  Controlled on the ExforgeHCT Rx, he likes the one pill;  discussed the need to lose wt, no salt, etc...  Hyperlipidemia>  FLPs reviewed> if he wants to improve these numbers MUST get on diet & get wt down!!!  OBESITY>  As above!!!  GU>  Per DrDahlstedt on Testos shots & Viagra...  DJD>  S/p arthroscopic surg left knee by DrMurphy & feeling better already...   Patient's Medications  New Prescriptions   No medications on file  Previous Medications   ALUM & MAG HYDROXIDE-SIMETH (MAGIC MOUTHWASH) SOLN    1 tsp swish and swallow four times a day.   APPLE CIDER VINEGAR 500 MG TABS    Take by mouth. 4 tsp    ASPIRIN 81 MG TABLET    Take 81 mg by mouth daily.     CHROMIUM PICOLINATE 200 MCG TABS    Take 1 tablet by mouth daily.     CINNAMON 500 MG CAPSULE    Take 1,000 mg by mouth daily.    CLOTRIMAZOLE-BETAMETHASONE (LOTRISONE) CREAM    Apply to affected area 2 times daily   EXFORGE HCT 10-320-25 MG TABS    TAKE 1 TABLET BY MOUTH ONCE A DAY   GINKGO BILOBA 100 MG CAPS    Take 1 capsule by mouth daily.     HYDROCORTISONE (PROCTOSOL HC) 2.5 % RECTAL CREAM    Apply as directed after each BM    MULTIPLE VITAMIN (MULTIVITAMIN) TABLET    Take 1 tablet by mouth daily.     NIACIN 500 MG CR CAPSULE    Take 500 mg by mouth 2 (two) times daily.    OMEGA-3 FATTY ACIDS (FISH OIL) 1200 MG CAPS    Take 1,200 mg by mouth 2 (two) times daily.   OMEPRAZOLE (PRILOSEC) 20 MG CAPSULE    TAKE 1 CAPSULE BY MOUTH ONCE A DAY   POTASSIUM GLUCONATE 550 MG TABS    Take 1 tablet by mouth daily.     SILDENAFIL (VIAGRA) 100 MG TABLET    Take as directed  SIMVASTATIN (ZOCOR) 40 MG TABLET    TAKE 1 TABLET BY MOUTH AT BEDTIME   TESTOSTERONE ENANTHATE (DELATESTRYL) 200 MG/ML INJECTION    Inject into the muscle every 14 (fourteen) days. For IM use only  Modified Medications   Modified Medication Previous Medication   ALPRAZOLAM (XANAX) 0.5 MG TABLET  ALPRAZolam (XANAX) 0.5 MG tablet      Take 1 tablet (0.5 mg total) by mouth 3 (three) times daily as needed. For nerves    Take 1 tablet (0.5 mg total) by mouth 3 (three) times daily as needed. For nerves   HYDROCODONE-ACETAMINOPHEN (VICODIN) 5-500 MG PER TABLET HYDROcodone-acetaminophen (VICODIN) 5-500 MG per tablet      Take one tablet by mouth every 6-8 hours as needed NOT TO EXCEED 3 PER DAY    Take one tablet by mouth every 6-8 hours as needed NOT TO EXCEED 3 PER DAY   SERTRALINE (ZOLOFT) 25 MG TABLET sertraline (ZOLOFT) 25 MG tablet      TAKE 1 TABLET BY MOUTH EVERY DAY    TAKE 1 TABLET BY MOUTH EVERY DAY  Discontinued Medications   No medications on file

## 2012-04-02 NOTE — Patient Instructions (Addendum)
Today we updated your med list in our EPIC system...    Continue your current medications the same...  We refilled your Alprazolam & Hydrocodone today...  Today we did your follow up Metabolic labs...    We will communicate the results when avail...  Call for any questions...  Dakota Ellison, it is imperative that you get your weight down!!!  Let's plan a follow up visit in 6 months.Marland KitchenMarland Kitchen

## 2012-04-09 ENCOUNTER — Other Ambulatory Visit: Payer: Self-pay | Admitting: Pulmonary Disease

## 2012-07-20 ENCOUNTER — Other Ambulatory Visit (HOSPITAL_COMMUNITY): Payer: Self-pay | Admitting: Orthopedic Surgery

## 2012-07-20 ENCOUNTER — Ambulatory Visit (HOSPITAL_COMMUNITY)
Admission: RE | Admit: 2012-07-20 | Discharge: 2012-07-20 | Disposition: A | Discharge status: Home / Self Care | Payer: BC Managed Care – PPO | Source: Ambulatory Visit | Attending: Orthopedic Surgery | Admitting: Orthopedic Surgery

## 2012-07-20 DIAGNOSIS — M7989 Other specified soft tissue disorders: Secondary | ICD-10-CM

## 2012-07-20 DIAGNOSIS — M79606 Pain in leg, unspecified: Secondary | ICD-10-CM

## 2012-07-20 DIAGNOSIS — M25469 Effusion, unspecified knee: Secondary | ICD-10-CM | POA: Insufficient documentation

## 2012-07-20 DIAGNOSIS — R52 Pain, unspecified: Secondary | ICD-10-CM

## 2012-07-20 DIAGNOSIS — M79609 Pain in unspecified limb: Secondary | ICD-10-CM | POA: Insufficient documentation

## 2012-07-20 DIAGNOSIS — R609 Edema, unspecified: Secondary | ICD-10-CM

## 2012-07-20 DIAGNOSIS — M25569 Pain in unspecified knee: Secondary | ICD-10-CM | POA: Insufficient documentation

## 2012-07-20 NOTE — Progress Notes (Signed)
VASCULAR LAB PRELIMINARY  PRELIMINARY  PRELIMINARY  PRELIMINARY  Left lower extremity venous duplex completed.    Preliminary report:  Left:  No evidence of DVT, superficial thrombosis, or Baker's cyst.  Lizzie An, RVT 07/20/2012, 3:34 PM

## 2012-07-23 ENCOUNTER — Telehealth: Payer: Self-pay | Admitting: Pulmonary Disease

## 2012-07-23 NOTE — Telephone Encounter (Signed)
Spoke with pt and notified okay to take extra 325 mg tylenol if needed He verbalized understanding and states nothing further needed

## 2012-07-23 NOTE — Telephone Encounter (Signed)
Last OV 04/02/12. Pending OV 10/01/12. The pt states  The new Vicodin 5/325 do not help as well as the 5/500 and wants to know if he can take an extra Tylenol with it or if there is something else he can try. SN, pls advise.No Known Allergies

## 2012-07-23 NOTE — Telephone Encounter (Signed)
Per SN----yes he can take an extra tylenol with the vicodin.   thanks

## 2012-09-25 ENCOUNTER — Telehealth: Payer: Self-pay | Admitting: Pulmonary Disease

## 2012-09-25 DIAGNOSIS — K469 Unspecified abdominal hernia without obstruction or gangrene: Secondary | ICD-10-CM

## 2012-09-25 NOTE — Telephone Encounter (Signed)
Spoke with the pt and he states that he and Dr. Kriste Basque have discussed him having hernias in the past but the pt wanted to wait to have anything done until they started bothering him. He states the hernia in now painful and burning and he called Dr. Sid Falcon office to get an appt and their first available was Oct 17, 2012. Pt wanted to know if there was someone else that Dr. Kriste Basque recommended or could we place a referral and see if we can get the pt seen sooner? Please advise. Carron Curie, CMA

## 2012-09-25 NOTE — Telephone Encounter (Signed)
ATC patient, no answer [id number] 

## 2012-09-27 ENCOUNTER — Ambulatory Visit (INDEPENDENT_AMBULATORY_CARE_PROVIDER_SITE_OTHER): Payer: Self-pay | Admitting: General Surgery

## 2012-09-27 ENCOUNTER — Telehealth: Payer: Self-pay | Admitting: Pulmonary Disease

## 2012-09-27 NOTE — Telephone Encounter (Signed)
Please advise [facility]'s referral was placed today. thanks

## 2012-09-27 NOTE — Telephone Encounter (Signed)
Per SN---  Ok to refer to Dr. Gerrit Friends for hernia repair.  thanks

## 2012-09-27 NOTE — Telephone Encounter (Signed)
referral placed. Jennifer Castillo, CMA  

## 2012-09-28 NOTE — Telephone Encounter (Signed)
Unable to see dr gerkin before 10/17/12 another dr could see him sooner but pt has chocen to stay with dr gerkin Dakota Ellison

## 2012-10-01 ENCOUNTER — Other Ambulatory Visit (INDEPENDENT_AMBULATORY_CARE_PROVIDER_SITE_OTHER): Payer: BC Managed Care – PPO

## 2012-10-01 ENCOUNTER — Ambulatory Visit (INDEPENDENT_AMBULATORY_CARE_PROVIDER_SITE_OTHER): Payer: BC Managed Care – PPO | Admitting: Pulmonary Disease

## 2012-10-01 ENCOUNTER — Encounter: Payer: Self-pay | Admitting: Pulmonary Disease

## 2012-10-01 VITALS — BP 134/80 | HR 79 | Temp 98.3°F | Ht 72.0 in | Wt 320.0 lb

## 2012-10-01 DIAGNOSIS — Z0000 Encounter for general adult medical examination without abnormal findings: Secondary | ICD-10-CM

## 2012-10-01 DIAGNOSIS — IMO0002 Reserved for concepts with insufficient information to code with codable children: Secondary | ICD-10-CM

## 2012-10-01 DIAGNOSIS — E291 Testicular hypofunction: Secondary | ICD-10-CM

## 2012-10-01 DIAGNOSIS — I10 Essential (primary) hypertension: Secondary | ICD-10-CM

## 2012-10-01 DIAGNOSIS — M1990 Unspecified osteoarthritis, unspecified site: Secondary | ICD-10-CM

## 2012-10-01 DIAGNOSIS — F341 Dysthymic disorder: Secondary | ICD-10-CM

## 2012-10-01 DIAGNOSIS — E785 Hyperlipidemia, unspecified: Secondary | ICD-10-CM

## 2012-10-01 DIAGNOSIS — G4733 Obstructive sleep apnea (adult) (pediatric): Secondary | ICD-10-CM

## 2012-10-01 DIAGNOSIS — K219 Gastro-esophageal reflux disease without esophagitis: Secondary | ICD-10-CM

## 2012-10-01 LAB — BASIC METABOLIC PANEL
BUN: 13 mg/dL (ref 6–23)
Calcium: 9.4 mg/dL (ref 8.4–10.5)
GFR: 73.87 mL/min (ref >60.00)
Glucose, Bld: 94 mg/dL (ref 70–99)
Potassium: 4.3 mEq/L (ref 3.5–5.1)
Sodium: 139 mEq/L (ref 135–145)

## 2012-10-01 LAB — LIPID PANEL
Cholesterol: 128 mg/dL (ref 0–200)
HDL: 35.2 mg/dL — ABNORMAL LOW (ref >39.00)
LDL Cholesterol: 59 mg/dL (ref 0–99)
VLDL: 33.4 mg/dL (ref 0.0–40.0)

## 2012-10-01 LAB — CBC WITH DIFFERENTIAL/PLATELET
Basophils Relative: 0.6 % (ref 0.0–3.0)
Eosinophils Relative: 4.1 % (ref 0.0–5.0)
Lymphocytes Relative: 21.9 % (ref 12.0–46.0)
MCV: 85.7 fl (ref 78.0–100.0)
Monocytes Absolute: 0.8 10*3/uL (ref 0.1–1.0)
Monocytes Relative: 7.8 % (ref 3.0–12.0)
Neutrophils Relative %: 65.6 % (ref 43.0–77.0)
Platelets: 269 10*3/uL (ref 150.0–400.0)
RBC: 5.17 Mil/uL (ref 4.22–5.81)
WBC: 10.5 10*3/uL (ref 4.5–10.5)

## 2012-10-01 LAB — HEPATIC FUNCTION PANEL
AST: 26 U/L (ref 0–37)
Albumin: 4.2 g/dL (ref 3.5–5.2)
Total Bilirubin: 0.7 mg/dL (ref 0.3–1.2)

## 2012-10-01 LAB — PSA: PSA: 1.3 ng/mL (ref 0.10–4.00)

## 2012-10-01 LAB — TSH: TSH: 2.39 u[IU]/mL (ref 0.35–5.50)

## 2012-10-01 LAB — HEMOGLOBIN A1C: Hgb A1c MFr Bld: 5.9 % (ref 4.6–6.5)

## 2012-10-01 MED ORDER — MUPIROCIN 2 % EX OINT: TOPICAL_OINTMENT | Freq: Three times a day (TID) | CUTANEOUS | Status: DC

## 2012-10-01 MED ORDER — HYDROCODONE-ACETAMINOPHEN 5-325 MG PO TABS: 1.0000 | ORAL_TABLET | Freq: Three times a day (TID) | ORAL | Status: DC | PRN

## 2012-10-01 MED ORDER — ALPRAZOLAM 0.5 MG PO TABS: 0.5000 mg | ORAL_TABLET | Freq: Three times a day (TID) | ORAL | Status: DC | PRN

## 2012-10-01 NOTE — Patient Instructions (Addendum)
Today we updated your med list in our EPIC system...    Continue your current medications the same...    We wrote a new prescription for Bactroban ointment to use on the skin sores...  Today we did your FASTING blood work...    We will contact you w/ the results when available...   We will arrange for a [place] surg consult for possible Bariatric surgery for weight loss...  Call for any questions...  Let's plan a follow up visit in 48mo, sooner if needed for problems... Marland Kitchen

## 2012-10-01 NOTE — Progress Notes (Signed)
Subjective:    Patient ID: Dakota Ellison, male    DOB: 1950-11-06, 62 y.o.   MRN: [id number]  HPI 62 y/o WM here for an add-on visit... he has multiple medical problems as noted below...    ~  April 04, 2011:  61mo ROV & he reports doing well, needs Vicodin & Alpraz refilled, had Flu shot at work...    OSA> he had an AHI=82 in 2010 & started on CPAP20 by [facility], [facility] & he continues follow up there (we do not have notes)...    Hx mild asthma> he still smokes an occas cigar & asked to quit completely!!! He denies CP, palpit, SOB, edema...    HBP> on ExforgeHCT & K+; BP today= 124/76 & similar at work; he remains asymptomatic...    Hyperlipid> on Simva40 + Niaspan; last FLP 4/12 showed Chol ok but incr TG & he understands that improvement here requires him to lose weight!    Obesity> wt= 314# which is up 5# over the last 59mo; we reviewed diet/ exercise/ wt reduction program...    GI> GERD, Rectal fissure> on Prilosec20 & AnusolHC as needed; he is overdue for a f/u colonoscopy & will contact GI for this screening procedure...    GU> Low-T & ED>  He takes a bunch of vits & herbs including Chromium, cinnamon, vinegar, MVI, etc; DrDahlstedt treats him w/ Testopel implant...    DJD> he uses OTC analgesics and Vicodin as needed...    Depression> he remains on Zoloft25 & Xanax as needed...  ~  October 03, 2011:  61mo ROV & Dakota Ellison is doing reasonably well- he notes an intertrig rash on abd & we will Rx w/ Lotrisone cream;  We reviewed prob list, meds, xrays & labs> see below>> LABS 4/13:  FLP- at goals on Simva40;  Chems- ok w/ BS=106;  CBC- wnl;  TSH=2.20;  PSA=1.44  ~  October 28. 2013:  61mo ROV & Dakota Ellison is noting increased difficulty getting about (stiff joints) but his wt is up to 322# & he knows the critically important need to lose wt.     OSA> he had an AHI=82 in 2010 & started on CPAP20 by [facility], [facility] & he continues follow up there (we do not have notes)...    Hx mild asthma> he still smokes an  occas cigar & asked to quit completely!!! He states no attacks in yrs & breathing is OK- denies CP, palpit, SOB, edema...    HBP> on ExforgeHCT & K+; BP today= 118/76 & similar at work; he remains asymptomatic...    Hyperlipid> on Simva40 + Niaspan; last FLP 4/13 looked good; he understands that improvement here requires him to lose weight!    Obesity> wt= 322# which is up 8# over the last 59mo; we reviewed diet/ exercise/ wt reduction program...    GI> GERD, Rectal fissure> on Prilosec20 & AnusolHC as needed; he is overdue for a f/u colonoscopy & will contact GI for this screening procedure...    GU> Low-T & ED>  He takes a bunch of vits & herbs including chromium, cinnamon, vinegar, MVI, etc; DrDahlstedt treats him w/ Testos shots & Viagra.    DJD> he uses OTC analgesics and Vicodin as needed...    Depression> he remains on Zoloft25 & Xanax as needed... We reviewed prob list, meds, xrays and labs> see below for updates >> he's already had the 2013 Flu vaccine; requests Vicodin & Xanax refills today. LABS 10/13:  Chems- ok w/ BS106...   ~  October 01, 2012:  61mo ROV & Dakota Ellison has an appt coming up w/ DrGerkin, [facility] to see about his inguinal hernias R>L; he is also interested in discussing poss Bariatric Surg;  He notes that his weight is about the same but he has been unable to lose;  Resting well w/ CPAP from [facility], BP remains controlled on hisExforgeHCT, Chol has been good on the Simva40, etc...     We reviewed prob list, meds, xrays and labs> see below for updates >>  LABS 4/14:  FLP- ok x TG=167 on Simva40 & needs better low fat diet & wt reduction;  Chems- wnl w/ BS=94 & A1c=5.9;  CBC- wnl;  TSH=2.39;  PSA=1.30...          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 it got worse and was sent to DrWolicki who hospitalized him 3/08 w/ MRSA infection- Rx's w/ Vancomycin infusions... now  resolved.  OBSTRUCTIVE SLEEP APNEA (ICD-327.23) - Apr10> had Sleep Study at [facility]> pos for OSA w/ AHI= 82, desat to 85%... split night study w/ good control on CPAP 20 (see report)... started on CPAP by [facility] & still followed by him...  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 no recent exac and no regular meds... told to discontinue all smoking> denies much cough, sputum, dyspnea, CP, etc... ~  CXR 2/09 showed normal heart size, clear lungs, degen changes in Tspine, NAD...   HYPERTENSION (ICD-401.9) - followed by DrGollan/ [facility]> on ECASA 325mg /d & EXFORGE 10/320/25 daily + KCl 15mEq/d... he had CP while hosp w/ the MRSA 2008 and DrWolicki consulted DrMcQueen who did a NuclearStressTest 3/08 which was normal... and a 2DEcho which showed mild MR and nl LVF... ~ 10/12:  f/u visit DrGollan at [facility]> note reviewed, no changes made, DrGollan desperately wants him to lose weight! ~  10/12:  BP= 124/76 doing well, remains asymptomatic... ~  4/13:  BP= 120/78 & he denies CP, palpit, SOB, edema, etc... ~  10/13:  BP= 118/76 & he remains asymptomatic; EKG showed NSR, rate86, wnl, NAD... ~  4/14:  BP= 134/80 & he denies CP, palpit, dizzy, SOB, edema...  HYPERLIPIDEMIA (ICD-272.4) - he's been switched around by [facility] betw Simva/ Caduet/ back to SIMVASTATIN 40mg /d now + NIACIN 500mg Bid + FISH OIL 1000mg Bid...  still not on any kind of diet but his weight is stable at 294#.Marland Kitchen. ~  FLP 1/08 showed TChol 195, TG 119, HDL 41, LDL 131 ~  FLP 2/09 showed TChol 214, TG 214, HDL 39, LDL 127 ~  12/09: recent labs by [facility]- we don't have these results... ~  FLP 5/10 on 3 meds above showed TChol 148, TG 81, HDL 48, LDL 84 ~  FLP 11/10 on 3 meds showed TChol 135, TG 145, HDL 36, LDL 70 ~  FLP 5/11 showed TChol 145, TG 128, HDL 43, LDL 77... keep same Rx. ~  FLP 10/11 showed TChol 152, TG 198, HDL 41, LDL 72... same meds, better diet, get wt down! ~  FLP 4/12 showed TChol 141, TG  200, HDL 39, LDL 62... Similar> needs better low fat diet, get wt down. ~  10/12:  FLP not rechecked today as he has not lost wt etc... ~  FLP 4/13 on Simva40+Niasp1000 showed TChol 137, TG 124, HDL 43, LDL 69 ~  FLP 4/14 on Simva40+Niasp1000 showed TChol 128, TG 167,  HDL 35, LDL 59  MORBID OBESITY (ICD-278.01) - despite all efforts weight remains >300#... discussed wt watchers, nutrition center, poss bariatric approach... he's not been able to exercise but will join the Y & start diet program! ~  5/11:  he reports that DrElkins gave him 30d supply of Phenteramine which really helped- lost 20#, but put it all back + 9# more off this med... we discussed the dangers of diet pills & rec diet + exercise, neither of which he has tried in earnest! ~  10/11:  weight up 15# to 314# today> discussed diet, exercise, etc... ~  4/12:  Weight = 309# and needs to do better w/ diet/ exercise... ~  10/12:  Weight = 314# and we reviewed diet, exercise, wt loss program... ~  4/13:  Weight = 311# ~  10/13:  Weight = 322# ~  4/14:  Weight = 320#  GERD (ICD-530.81) - he had an EGD from [facility] 4/03 which was neg... he takes OMEPRAZOLE 20mg /d for reflux symptoms which works well...  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 prev tried Diltiazem cream, now ANUSOL [facility] etc... ~  pt reports colonoscopy by [facility] 7/03- reported neg & f/u planned 5 yrs> he is overdue & will call for f/u exam...  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 also takes Viagra Prn...  ~  labs 5/10 showed PSA = 0.85 ~  labs 5/11 showed PSA= 0.65 ~  Labs 4/12 showed PSA= 0.79 ~  Labs 4/13 showed PSA= 1.44 ~  9/13: seen by DrDahlstedt> treated for hypogonadism w/ Testopel, then Testos  shots but he was irregular w/ his visits; prostate exam was wnl; they tried to teach him self injected Testos Q2weeks...  ~  Labs 4/14 showed PSA= 1.30  OSTEOARTHRITIS (ICD-715.90) DEGENERATIVE DISC DISEASE (ICD-722.6) - followed by DrMurphy for right knee pain... prev seen by Mayford Knife for DDD and LBP... ~  s/p left CTS surg 2009 ~  s/p right knee arthroscopy by DrMurphy 2/10... also had cortisone shot in knee 4/10 ~  s/p trigger finger injections by DrGramig ~  4/12:  s/p left knee arthroscopy by DrMurphy> improved...  DYSTHYMIA (ICD-300.4) - some stress, some depression which is mild by his determination... he tried Lexapro last year but didn't follow up... now using Xanax as needed, and started ZOLOFT 25mg /d in Feb10- he reports better, wants to continue this dose.  HEALTH MAINTENANCE: ~  GI:  He saw [facility] in 2003 & long overdue for f/u colonoscopy- he promises to call for f/u exam. ~  GU:  Followed by DrDahlstedt as above... ~  Immuniz:  He gets Flu shot each yr at work; he had TDAP at work 2013...   Past Surgical History  Procedure Date  . Sphincterotomy   . Shoulder arthroscopy     right  . Other surgical history     arthroscopic subacromial decompression  . Other surgical history     open resection, distal right clavicle  . Knee surgery 09/2010    left knee    =>  s/p left knee arthroscopy 4/12 by DrMurphy      Outpatient Encounter Prescriptions as of 10/01/2012  Medication Sig Dispense Refill  . ALPRAZolam (XANAX) 0.5 MG tablet Take 1 tablet (0.5 mg total) by  mouth 3 (three) times daily as needed. For nerves  90 tablet  5  . Apple Cider Vinegar 500 MG TABS Take by mouth. 4 tsp       . aspirin 81 MG tablet Take 81 mg by mouth daily.        . Cinnamon 500 MG capsule Take 1,000 mg by mouth daily.       . clotrimazole-betamethasone (LOTRISONE) cream Apply to affected area 2 times daily  15 g  5  . EXFORGE HCT 10-320-25 MG TABS TAKE 1 TABLET BY MOUTH ONCE A DAY  90  tablet  4  . Ginkgo Biloba 100 MG CAPS Take 1 capsule by mouth daily.        Marland Kitchen HYDROcodone-acetaminophen (VICODIN) 5-500 MG per tablet Take one tablet by mouth every 6-8 hours as needed NOT TO EXCEED 3 PER DAY  90 tablet  5  . hydrocortisone (PROCTOSOL HC) 2.5 % rectal cream Apply as directed after each BM       . Multiple Vitamin (MULTIVITAMIN) tablet Take 1 tablet by mouth daily.        . niacin 500 MG CR capsule Take 500 mg by mouth 2 (two) times daily.       . Omega-3 Fatty Acids (FISH OIL) 1200 MG CAPS Take 1,200 mg by mouth 2 (two) times daily.      Marland Kitchen omeprazole (PRILOSEC) 20 MG capsule TAKE 1 CAPSULE BY MOUTH ONCE A DAY  90 capsule  4  . sertraline (ZOLOFT) 25 MG tablet TAKE 1 TABLET BY MOUTH EVERY DAY  90 tablet  2  . sildenafil (VIAGRA) 100 MG tablet Take as directed  10 tablet  5  . simvastatin (ZOCOR) 40 MG tablet TAKE 1 TABLET BY MOUTH AT BEDTIME  90 tablet  4  . testosterone enanthate (DELATESTRYL) 200 MG/ML injection Inject into the muscle every 14 (fourteen) days. For IM use only      . [DISCONTINUED] Alum & Mag Hydroxide-Simeth (MAGIC MOUTHWASH) SOLN 1 tsp swish and swallow four times a day.  120 mL  0  . [DISCONTINUED] Chromium Picolinate 200 MCG TABS Take 1 tablet by mouth daily.        . [DISCONTINUED] Potassium Gluconate 550 MG TABS Take 1 tablet by mouth daily.         No facility-administered encounter medications on file as of 10/01/2012.    No Known Allergies   Current Medications, Allergies, Past Medical History, Past Surgical History, Family History, and Social History were reviewed in [employer] record.    Review of Systems         See HPI - all other systems neg except as noted... The patient complains of dyspnea on exertion.  The patient denies anorexia, fever, weight loss, weight gain, vision loss, decreased hearing, hoarseness, chest pain, syncope, peripheral edema, prolonged cough, headaches, hemoptysis, abdominal pain, melena,  hematochezia, severe indigestion/heartburn, hematuria, incontinence, muscle weakness, suspicious skin lesions, transient blindness, difficulty walking, depression, unusual weight change, abnormal bleeding, enlarged lymph nodes, and angioedema.     Objective:   Physical Exam     WD, Obese, 62 y/o WM in NAD... GENERAL:  Alert & oriented; pleasant & cooperative. HEENT:  [place]/AT, EOM-wnl, PERRLA, EACs-clear, TMs-wnl, NOSE-clear, THROAT-clear & wnl. NECK:  Supple w/ fairROM; no JVD; normal carotid impulses w/o bruits; no thyromegaly or nodules palpated; no lymphadenopathy. CHEST:  Clear to P & A; without wheezes/ rales/ or rhonchi. HEART:  Regular Rhythm; gr 1/6 SEM, without rubs  or gallops... ABDOMEN:  Obese soft & nontender; normal bowel sounds; no organomegaly or masses detected.  EXT: without deformities, mild arthritic changes; no varicose veins/ +venous insuffic/ no edema.right knee w/ ace wrap.  NEURO:  CN's intact; no focal neuro deficits... DERM:  Testopel Implants palpated in his right lat butt cheek area, no infection or drainage seen.  RADIOLOGY DATA:  Reviewed in the EPIC EMR & discussed w/ the patient...  LABORATORY DATA:  Reviewed in the EPIC EMR & discussed w/ the patient...   Assessment & Plan:    OSA>  On CPAP per [facility] & pt informs me he has f/u appt soon...  Asthma>  Still smoking cigars & needs to quit, we discussed smoking cessation but he doesn't want med help etc...  HBP>  Controlled on the ExforgeHCT Rx, he likes the one pill;  discussed the need to lose wt, no salt, etc...  Hyperlipidemia>  FLPs reviewed> if he wants to improve these numbers MUST get on diet & get wt down!!!  OBESITY>  As above!!!  GU>  Per DrDahlstedt on Testos shots & Viagra...  DJD>  S/p arthroscopic surg left knee by DrMurphy & feeling better already...   Patient's Medications  New Prescriptions   HYDROCODONE-ACETAMINOPHEN (NORCO/VICODIN) 5-325 MG PER TABLET    Take 1 tablet by mouth  3 (three) times daily as needed for pain.  Previous Medications   APPLE CIDER VINEGAR 500 MG TABS    Take by mouth. 4 tsp    ASPIRIN 81 MG TABLET    Take 81 mg by mouth daily.     CINNAMON 500 MG CAPSULE    Take 2,000 mg by mouth daily.    EXFORGE HCT 10-320-25 MG TABS    TAKE 1 TABLET BY MOUTH ONCE A DAY   GINKGO BILOBA 100 MG CAPS    Take 60 mg by mouth daily.    HYDROCORTISONE (PROCTOSOL HC) 2.5 % RECTAL CREAM    Apply as directed after each BM    MULTIPLE VITAMIN (MULTIVITAMIN) TABLET    Take 1 tablet by mouth daily.     NIACIN 500 MG CR CAPSULE    Take 500 mg by mouth 2 (two) times daily.    OMEPRAZOLE (PRILOSEC) 20 MG CAPSULE    TAKE 1 CAPSULE BY MOUTH ONCE A DAY   SERTRALINE (ZOLOFT) 25 MG TABLET    TAKE 1 TABLET BY MOUTH EVERY DAY   SILDENAFIL (VIAGRA) 100 MG TABLET    Take as directed   SIMVASTATIN (ZOCOR) 40 MG TABLET    TAKE 1 TABLET BY MOUTH AT BEDTIME   TESTOSTERONE CYPIONATE (DEPOTESTOTERONE CYPIONATE) 200 MG/ML INJECTION       TESTOSTERONE ENANTHATE (DELATESTRYL) 200 MG/ML INJECTION    Inject into the muscle every 14 (fourteen) days. For IM use only  Modified Medications   Modified Medication Previous Medication   ALPRAZOLAM (XANAX) 0.5 MG TABLET ALPRAZolam (XANAX) 0.5 MG tablet      Take 1 tablet (0.5 mg total) by mouth 3 (three) times daily as needed for anxiety. For nerves    Take 1 tablet (0.5 mg total) by mouth 3 (three) times daily as needed. For nerves  Discontinued Medications   ALUM & MAG HYDROXIDE-SIMETH (MAGIC MOUTHWASH) SOLN    1 tsp swish and swallow four times a day.   CHROMIUM PICOLINATE 200 MCG TABS    Take 1 tablet by mouth daily.     HYDROCODONE-ACETAMINOPHEN (VICODIN) 5-500 MG PER TABLET    Take one tablet  by mouth every 6-8 hours as needed NOT TO EXCEED 3 PER DAY   MUPIROCIN OINTMENT (BACTROBAN) 2 %    Apply topically 3 (three) times daily.   OMEGA-3 FATTY ACIDS (FISH OIL) 1200 MG CAPS    Take 1,200 mg by mouth 2 (two) times daily.   POTASSIUM GLUCONATE  550 MG TABS    Take 1 tablet by mouth daily.

## 2012-10-03 ENCOUNTER — Ambulatory Visit (INDEPENDENT_AMBULATORY_CARE_PROVIDER_SITE_OTHER): Payer: Self-pay | Admitting: General Surgery

## 2012-10-17 ENCOUNTER — Ambulatory Visit (INDEPENDENT_AMBULATORY_CARE_PROVIDER_SITE_OTHER): Payer: BC Managed Care – PPO | Admitting: Surgery

## 2012-10-17 ENCOUNTER — Ambulatory Visit (INDEPENDENT_AMBULATORY_CARE_PROVIDER_SITE_OTHER): Payer: Self-pay | Admitting: Surgery

## 2012-10-17 ENCOUNTER — Encounter (INDEPENDENT_AMBULATORY_CARE_PROVIDER_SITE_OTHER): Payer: Self-pay | Admitting: Surgery

## 2012-10-17 VITALS — BP 132/72 | HR 61 | Temp 97.6°F | Resp 18 | Ht 72.0 in | Wt 316.0 lb

## 2012-10-17 DIAGNOSIS — R1031 Right lower quadrant pain: Secondary | ICD-10-CM | POA: Insufficient documentation

## 2012-10-17 DIAGNOSIS — K4090 Unilateral inguinal hernia, without obstruction or gangrene, not specified as recurrent: Secondary | ICD-10-CM

## 2012-10-17 NOTE — Patient Instructions (Signed)

## 2012-10-17 NOTE — Progress Notes (Signed)
General Surgery [facility]  Chief Complaint  Patient presents with  . New Evaluation    eval bilateral hernia - referral from Dr. Alroy Dust    HISTORY: Patient is a 62 year old white male referred by his primary care physician for evaluation of possible bilateral inguinal hernias. Patient has a history of left inguinal hernia repair with mesh by Dr. Glenna Fellows in 1998.  Approximately 3 weeks ago the patient developed a burning sensation in the left groin. This appears to be somewhat positional. He denies any pain in the right groin. Patient has a known history of inguinal hernia on the right side per his primary care physician. He denies any obstructive symptoms. He presents today for evaluation.  Past Medical History  Diagnosis Date  . OSA (obstructive sleep apnea)   . History of MRSA infection   . Mild asthma   . Hypertension   . Hyperlipemia   . Morbid obesity   . GERD (gastroesophageal reflux disease)   . Rectal fissure   . Testosterone deficiency   . ED (erectile dysfunction)   . Osteoarthritis   . Degenerative disc disease   . Dysthymia      Current Outpatient Prescriptions  Medication Sig Dispense Refill  . ALPRAZolam (XANAX) 0.5 MG tablet Take 1 tablet (0.5 mg total) by mouth 3 (three) times daily as needed for anxiety. For nerves  90 tablet  5  . Apple Cider Vinegar 500 MG TABS Take by mouth. 4 tsp       . aspirin 81 MG tablet Take 81 mg by mouth daily.        . Cinnamon 500 MG capsule Take 2,000 mg by mouth daily.       Marland Kitchen EXFORGE HCT 10-320-25 MG TABS TAKE 1 TABLET BY MOUTH ONCE A DAY  90 tablet  4  . Ginkgo Biloba 100 MG CAPS Take 60 mg by mouth daily.       Marland Kitchen HYDROcodone-acetaminophen (NORCO/VICODIN) 5-325 MG per tablet Take 1 tablet by mouth 3 (three) times daily as needed for pain.  90 tablet  5  . hydrocortisone (PROCTOSOL HC) 2.5 % rectal cream Apply as directed after each BM       . Multiple Vitamin (MULTIVITAMIN) tablet Take 1  tablet by mouth daily.        . niacin 500 MG CR capsule Take 500 mg by mouth 2 (two) times daily.       Marland Kitchen omeprazole (PRILOSEC) 20 MG capsule TAKE 1 CAPSULE BY MOUTH ONCE A DAY  90 capsule  4  . sertraline (ZOLOFT) 25 MG tablet TAKE 1 TABLET BY MOUTH EVERY DAY  90 tablet  2  . sildenafil (VIAGRA) 100 MG tablet Take as directed  10 tablet  5  . simvastatin (ZOCOR) 40 MG tablet TAKE 1 TABLET BY MOUTH AT BEDTIME  90 tablet  4  . testosterone cypionate (DEPOTESTOTERONE CYPIONATE) 200 MG/ML injection       . testosterone enanthate (DELATESTRYL) 200 MG/ML injection Inject into the muscle every 14 (fourteen) days. For IM use only       No current facility-administered medications for this visit.     No Known Allergies   Family History  Problem Relation Age of Onset  . Pneumonia Brother      History   Social History  . Marital Status: Single    Spouse Name: N/A    Number of Children: 1  . Years of Education: N/A   Occupational History  .  long distance driver    Social History Main Topics  . Smoking status: Former Smoker    Types: Cigars    Quit date: 06/07/2011  . Smokeless tobacco: Never Used     Comment: CIGARS ONLY  . Alcohol Use: Yes     Comment: social  . Drug Use: No  . Sexually Active: None   Other Topics Concern  . None   Social History Narrative  . None     REVIEW OF SYSTEMS - PERTINENT POSITIVES ONLY: No signs or symptoms of intestinal obstruction. Burning discomfort left groin, positional. Asymptomatic on right.  EXAM: Filed Vitals:   10/17/12 1134  BP: 132/72  Pulse: 61  Temp: 97.6 F (36.4 C)  Resp: 18    HEENT: normocephalic; pupils equal and reactive; sclerae clear; dentition good; mucous membranes moist NECK:  symmetric on extension; no palpable anterior or posterior cervical lymphadenopathy; no supraclavicular masses; no tenderness CHEST: clear to auscultation bilaterally without rales, rhonchi, or wheezes CARDIAC: regular rate and rhythm  without significant murmur; peripheral pulses are full ABDOMEN: soft without distension; bowel sounds present; no mass; no hepatosplenomegaly; no hernia GU:  Normal male genitalia without mass or lesion; no sign of epididymitis; palpation in the right inguinal canal with cough and Valsalva shows a moderate inguinal hernia consistent with indirect hernia; palpation in the left inguinal canal with cough and Valsalva shows no laxity of the inguinal floor and no sign of direct or indirect inguinal hernia. EXT:  non-tender without edema; no deformity NEURO: no gross focal deficits; no sign of tremor   LABORATORY RESULTS: See [facility] (CHL-Epic) for most recent results   RADIOLOGY RESULTS: See [facility] (CHL-Epic) for most recent results   IMPRESSION: #1 right inguinal hernia, probably indirect, asymptomatic #2 status post left inguinal herniorrhaphy 1998, intermittent pain #3 morbid obesity #4 hypertension #5 sleep apnea  PLAN: The patient and I had a lengthy discussion about the above findings. I would like to obtain a CT scan of the pelvis to evaluate for possible bilateral hernias. This would certainly change our surgical approach. If it appears that there is only a right inguinal hernia present, then he may be a candidate for open hernia repair. If there are bilateral hernias noted, then a laparoscopic approach would be preferred.  We will obtain a CT scan of the pelvis for evaluation for possible bilateral inguinal hernia. I will contact the patient with those results.  Velora Heckler, MD, FACS General & Endocrine Surgery [facility]   Visit Diagnoses: 1. Inguinal hernia unilateral, non-recurrent, left   2. Right groin pain, hx of RIH repair with mesh     Primary Care Physician: Michele Mcalpine, MD

## 2012-10-22 ENCOUNTER — Ambulatory Visit
Admission: RE | Admit: 2012-10-22 | Discharge: 2012-10-22 | Disposition: A | Discharge status: Home / Self Care | Payer: BC Managed Care – PPO | Source: Ambulatory Visit | Attending: Surgery | Admitting: Surgery

## 2012-10-22 DIAGNOSIS — K4090 Unilateral inguinal hernia, without obstruction or gangrene, not specified as recurrent: Secondary | ICD-10-CM

## 2012-10-22 DIAGNOSIS — R1031 Right lower quadrant pain: Secondary | ICD-10-CM

## 2012-10-22 MED ORDER — IOHEXOL 300 MG/ML  SOLN
125.0000 mL | Freq: Once | INTRAMUSCULAR | Status: AC | PRN
  Administered 2012-10-22: 125 mL via INTRAVENOUS

## 2012-10-23 ENCOUNTER — Telehealth: Payer: Self-pay | Admitting: Pulmonary Disease

## 2012-10-23 NOTE — Telephone Encounter (Signed)
Error.Dakota Ellison ° °

## 2012-10-24 ENCOUNTER — Telehealth (INDEPENDENT_AMBULATORY_CARE_PROVIDER_SITE_OTHER): Payer: Self-pay

## 2012-10-24 NOTE — Telephone Encounter (Signed)
Ct result = only small right inguinal hernia no hernia on left. I have LMOM for pt to call. Surgery posting form and CT report sent to Dr Gerrit Friends to review,put orders in epic and complete. Will send scheduling form to surgery schedulers once received back.

## 2012-10-31 ENCOUNTER — Telehealth (INDEPENDENT_AMBULATORY_CARE_PROVIDER_SITE_OTHER): Payer: Self-pay | Admitting: Surgery

## 2012-10-31 DIAGNOSIS — R1031 Right lower quadrant pain: Secondary | ICD-10-CM

## 2012-10-31 NOTE — Telephone Encounter (Signed)
Called patient and left message about CT scan results.  No evidence of recurrence of LIH.  Small RIH containing only fat.  No need for surgical intervention at this point.  May benefit from NSAID for symptomatic relief.  Will leave that up to primary MD, Dr. Kriste Basque.  Will see at [facility] office as needed.  Velora Heckler, MD, [facility] Office: [phone]

## 2012-11-08 ENCOUNTER — Telehealth: Payer: Self-pay | Admitting: Pulmonary Disease

## 2012-11-08 NOTE — Telephone Encounter (Signed)
Called, spoke with pt.  States he was having pain on his left side, so Dr. Gerrit Friends ordered a CT Pelvis.  Pt had this done on 10/22/12 at [facility].  Per pt, he was advised by Dr. Gerrit Friends that pain may be from a previous hernia repair and he would forward results to SN who may want to start pt on an antiinflammatory.  Pt would like to know if SN has received these results yet and would like to know his recs.  Dr. Kriste Basque, pls advise.  Thank you.  CT Pelvis results are in pt's chart and printed with this msg for SN's cart. Last OV with SN:  10/01/12; asked to f/u in 6 months. Pt has pending OV with SN on 04/01/13  No Known Allergies

## 2012-11-09 MED ORDER — MELOXICAM 15 MG PO TABS: 15.0000 mg | ORAL_TABLET | Freq: Every day | ORAL | Status: DC | PRN

## 2012-11-09 NOTE — Telephone Encounter (Signed)
Pt is aware of recs. rx has been sent. Nothing further was needed 

## 2012-11-09 NOTE — Telephone Encounter (Signed)
Per SN--recs for meloxicam  15 mg   #30  1 daily prn pain.   Refill x 6.

## 2012-11-23 ENCOUNTER — Telehealth: Payer: Self-pay | Admitting: Pulmonary Disease

## 2012-11-23 NOTE — Telephone Encounter (Signed)
Pt aware. Nothing further was needed 

## 2012-11-23 NOTE — Telephone Encounter (Signed)
lmomtcb x1 for pt  Per [facility] SN is not taking any new pts at this time--

## 2013-01-04 ENCOUNTER — Other Ambulatory Visit: Payer: Self-pay | Admitting: Pulmonary Disease

## 2013-01-29 ENCOUNTER — Encounter: Payer: Self-pay | Admitting: Pulmonary Disease

## 2013-01-29 ENCOUNTER — Telehealth: Payer: Self-pay | Admitting: Pulmonary Disease

## 2013-01-29 ENCOUNTER — Ambulatory Visit (INDEPENDENT_AMBULATORY_CARE_PROVIDER_SITE_OTHER): Payer: BC Managed Care – PPO | Admitting: Pulmonary Disease

## 2013-01-29 VITALS — BP 120/82 | HR 89 | Temp 98.8°F | Ht 72.0 in | Wt 307.2 lb

## 2013-01-29 DIAGNOSIS — E291 Testicular hypofunction: Secondary | ICD-10-CM

## 2013-01-29 DIAGNOSIS — J45909 Unspecified asthma, uncomplicated: Secondary | ICD-10-CM

## 2013-01-29 DIAGNOSIS — G4733 Obstructive sleep apnea (adult) (pediatric): Secondary | ICD-10-CM

## 2013-01-29 DIAGNOSIS — B86 Scabies: Secondary | ICD-10-CM

## 2013-01-29 DIAGNOSIS — K219 Gastro-esophageal reflux disease without esophagitis: Secondary | ICD-10-CM

## 2013-01-29 DIAGNOSIS — M1990 Unspecified osteoarthritis, unspecified site: Secondary | ICD-10-CM

## 2013-01-29 DIAGNOSIS — E785 Hyperlipidemia, unspecified: Secondary | ICD-10-CM

## 2013-01-29 DIAGNOSIS — I10 Essential (primary) hypertension: Secondary | ICD-10-CM

## 2013-01-29 NOTE — Telephone Encounter (Signed)
Spoke with pt and scheduled appt with SN for today at 3:30 pm  Nothing further needed per pt

## 2013-01-29 NOTE — Progress Notes (Signed)
Subjective:    Patient ID: Dakota Ellison, male    DOB: Oct 13, 1950, 62 y.o.   MRN: [id number]  HPI 62 y/o WM here for an add-on visit... he has multiple medical problems as noted below...    ~  April 04, 2011:  23mo ROV & he reports doing well, needs Vicodin & Alpraz refilled, had Flu shot at work...    OSA> he had an AHI=82 in 2010 & started on CPAP20 by [facility], [facility] & he continues follow up there (we do not have notes)...    Hx mild asthma> he still smokes an occas cigar & asked to quit completely!!! He denies CP, palpit, SOB, edema...    HBP> on ExforgeHCT & K+; BP today= 124/76 & similar at work; he remains asymptomatic...    Hyperlipid> on Simva40 + Niaspan; last FLP 4/12 showed Chol ok but incr TG & he understands that improvement here requires him to lose weight!    Obesity> wt= 314# which is up 5# over the last 23mo; we reviewed diet/ exercise/ wt reduction program...    GI> GERD, Rectal fissure> on Prilosec20 & AnusolHC as needed; he is overdue for a f/u colonoscopy & will contact GI for this screening procedure...    GU> Low-T & ED>  He takes a bunch of vits & herbs including Chromium, cinnamon, vinegar, MVI, etc; DrDahlstedt treats him w/ Testopel implant...    DJD> he uses OTC analgesics and Vicodin as needed...    Depression> he remains on Zoloft25 & Xanax as needed...  ~  October 03, 2011:  23mo ROV & Friend is doing reasonably well- he notes an intertrig rash on abd & we will Rx w/ Lotrisone cream;  We reviewed prob list, meds, xrays & labs> see below>> LABS 4/13:  FLP- at goals on Simva40;  Chems- ok w/ BS=106;  CBC- wnl;  TSH=2.20;  PSA=1.44  ~  October 28. 2013:  23mo ROV & [name] is noting increased difficulty getting about (stiff joints) but his wt is up to 322# & he knows the critically important need to lose wt.     OSA> he had an AHI=82 in 2010 & started on CPAP20 by [facility] & he continues follow up there (we do not have notes)...    Hx mild asthma> he still smokes an  occas cigar & asked to quit completely!!! He states no attacks in yrs & breathing is OK- denies CP, palpit, SOB, edema...    HBP> on ExforgeHCT & K+; BP today= 118/76 & similar at work; he remains asymptomatic...    Hyperlipid> on Simva40 + Niaspan; last FLP 4/13 looked good; he understands that improvement here requires him to lose weight!    Obesity> wt= 322# which is up 8# over the last 23mo; we reviewed diet/ exercise/ wt reduction program...    GI> GERD, Rectal fissure> on Prilosec20 & AnusolHC as needed; he is overdue for a f/u colonoscopy & will contact GI for this screening procedure...    GU> Low-T & ED>  He takes a bunch of vits & herbs including chromium, cinnamon, vinegar, MVI, etc; DrDahlstedt treats him w/ Testos shots & Viagra.    DJD> he uses OTC analgesics and Vicodin as needed...    Depression> he remains on Zoloft25 & Xanax as needed... We reviewed prob list, meds, xrays and labs> see below for updates >> he's already had the 2013 Flu vaccine; requests Vicodin & Xanax refills today. LABS 10/13:  Chems- ok w/ BS106...   ~  October 01, 2012:  825mo ROV & [name] has an appt coming up w/ DrGerkin, [facility] to see about his inguinal hernias R>L; he is also interested in discussing poss Bariatric Surg;  He notes that his weight is about the same but he has been unable to lose;  Resting well w/ CPAP from [facility], BP remains controlled on hisExforgeHCT, Chol has been good on the Simva40, etc...     We reviewed prob list, meds, xrays and labs> see below for updates >>  LABS 4/14:  FLP- ok x TG=167 on Simva40 & needs better low fat diet & wt reduction;  Chems- wnl w/ BS=94 & A1c=5.9;  CBC- wnl;  TSH=2.39;  PSA=1.30...  ~  January 29, 2013:  25mo ROV & acute visit request add-on for puritic rash> states it started several weeks ago, red sl excoriated rash on legs/ feet/ ankles (including betw the toes), groin, one on penis, buttock, back, chest; very pruritic- esp at night & discrete macules/ papules  are all c/w scabies...  Discussed w/ pt & rec treatment w/ KWELL lotion (wash, dry, apply for 12h & wash off, repeat process in 1wk if not completely resolved; also wrote for Atarax25mg  prn itching....    He saw DrGerkin 5/14> he felt the right inguinal hernia but could not find a recurrent  left sided hernia on exam (prev [facility] repair [facility]); they did CT Pelvis- showed RIH containing fat & prev LIH repair w/o recurrence, min calcif atherosclerotic changes noted...     Sherod saw [facility] 6/14 for colonoscopy> 2 sm sigmoid polyps removed, int hems, & we don't have path reports; pt told to f/u in 5-21yrs...    He had a f/u visit w/ DrDahlstedt 6/14> hypogonadism on testos shots 200mg /ml- getting 1ml every other week & Viagra prn; they checked UA, CBC, Prolactin, Testos level & PSA- we do not have their lab data... 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 it got worse and was sent to DrWolicki who hospitalized him 3/08 w/ MRSA infection- Rx's w/ Vancomycin infusions... now resolved.  OBSTRUCTIVE SLEEP APNEA (ICD-327.23) - Apr10> had Sleep Study at [facility]> pos for OSA w/ AHI= 82, desat to 85%... split night study w/ good control on CPAP 20 (see report)... started on CPAP by [facility] & still followed by him...  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 no recent exac and no regular meds... told to discontinue all smoking> denies much cough, sputum, dyspnea, CP, etc... ~  CXR 2/09 showed normal heart size, clear lungs, degen changes in Tspine, NAD...   HYPERTENSION (ICD-401.9) - followed by DrGollan/ [facility]> on ECASA 325mg /d & EXFORGE 10/320/25 daily + KCl 50mEq/d... he had CP while hosp w/ the MRSA 2008 and DrWolicki consulted DrMcQueen who did a NuclearStressTest 3/08 which was normal... and a  2DEcho which showed mild MR and nl LVF... ~ 10/12:  f/u visit DrGollan at [facility]> note reviewed, no changes made, DrGollan desperately wants him to lose weight! ~  10/12:  BP= 124/76 doing well, remains asymptomatic... ~  4/13:  BP= 120/78 & he denies CP, palpit, SOB, edema, etc... ~  10/13:  BP= 118/76 & he remains asymptomatic; EKG showed NSR, rate86, wnl, NAD... ~  4/14:  BP= 134/80 &  he denies CP, palpit, dizzy, SOB, edema... ~  8/14:  BP= 120/82 & he remains asymptomatic...  HYPERLIPIDEMIA (ICD-272.4) - he's been switched around by [facility] betw Simva/ Caduet/ back to SIMVASTATIN 40mg /d now + NIACIN 500mg Bid + FISH OIL 1000mg Bid...  still not on any kind of diet but his weight is stable at 294#.Marland Kitchen. ~  FLP 1/08 showed TChol 195, TG 119, HDL 41, LDL 131 ~  FLP 2/09 showed TChol 214, TG 214, HDL 39, LDL 127 ~  12/09: recent labs by [facility]- we don't have these results... ~  FLP 5/10 on 3 meds above showed TChol 148, TG 81, HDL 48, LDL 84 ~  FLP 11/10 on 3 meds showed TChol 135, TG 145, HDL 36, LDL 70 ~  FLP 5/11 showed TChol 145, TG 128, HDL 43, LDL 77... keep same Rx. ~  FLP 10/11 showed TChol 152, TG 198, HDL 41, LDL 72... same meds, better diet, get wt down! ~  FLP 4/12 showed TChol 141, TG 200, HDL 39, LDL 62... Similar> needs better low fat diet, get wt down. ~  10/12:  FLP not rechecked today as he has not lost wt etc... ~  FLP 4/13 on Simva40+Niasp1000 showed TChol 137, TG 124, HDL 43, LDL 69 ~  FLP 4/14 on Simva40+Niasp1000 showed TChol 128, TG 167, HDL 35, LDL 59  MORBID OBESITY (ICD-278.01) - despite all efforts weight remains >300#... discussed wt watchers, nutrition center, poss bariatric approach... he's not been able to exercise but will join the Y & start diet program! ~  5/11:  he reports that DrElkins gave him 30d supply of Phenteramine which really helped- lost 20#, but put it all back + 9# more off this med... we discussed the dangers of diet pills & rec diet + exercise,  neither of which he has tried in earnest! ~  10/11:  weight up 15# to 314# today> discussed diet, exercise, etc... ~  4/12:  Weight = 309# and needs to do better w/ diet/ exercise... ~  10/12:  Weight = 314# and we reviewed diet, exercise, wt loss program... ~  4/13:  Weight = 311# ~  10/13:  Weight = 322# ~  4/14:  Weight = 320# ~  8/14:  Weight = 307#  GERD (ICD-530.81) - he had an EGD from [facility] 4/03 which was neg... he takes OMEPRAZOLE 20mg /d for reflux symptoms which works well...  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 prev tried Diltiazem cream, now ANUSOL HC etc... COLON POLYPS >>  ~  pt reports colonoscopy by [facility] 7/03- reported neg & f/u planned 5 yrs> he is overdue & will call for f/u exam... ~  Octavio saw [facility] 6/14 for colonoscopy> 2 sm sigmoid polyps removed, int hems, & we don't have path reports; pt told to f/u in 5-52yrs...  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 also takes Viagra Prn...  ~  labs 5/10 showed PSA = 0.85 ~  labs 5/11 showed PSA= 0.65 ~  Labs 4/12 showed PSA= 0.79 ~  Labs 4/13 showed PSA= 1.44 ~  9/13: seen by DrDahlstedt> treated for hypogonadism w/ Testopel, then Testos shots but he was irregular w/ his visits; prostate exam was wnl; they tried to teach him self injected Testos Q2weeks...  ~  Labs 4/14 showed PSA= 1.30 ~  He had a f/u visit w/ DrDahlstedt 6/14> hypogonadism on testos shots 200mg /ml- getting 1ml every other week & Viagra prn; they checked UA, CBC, Prolactin, Testos level & PSA- we do not have their lab data.   OSTEOARTHRITIS (ICD-715.90) DEGENERATIVE DISC DISEASE (ICD-722.6) - followed by DrMurphy for right knee pain... prev seen by Mayford Knife for DDD and LBP... ~  s/p  left CTS surg 2009 ~  s/p right knee arthroscopy by DrMurphy 2/10... also had cortisone shot in knee 4/10 ~  s/p trigger finger injections by DrGramig ~  4/12:  s/p left knee arthroscopy by DrMurphy> improved...  DYSTHYMIA (ICD-300.4) - some stress, some depression which is mild by his determination... he tried Lexapro last year but didn't follow up... now using Xanax as needed, and started ZOLOFT 25mg /d in Feb10- he reports better, wants to continue this dose.  HEALTH MAINTENANCE: ~  GI:  He saw [facility] 6/14 for f/u colonoscopy w/ 2 polyps removed- we don't have path. ~  GU:  Followed by DrDahlstedt as above... ~  Immuniz:  He gets Flu shot each yr at work; he had TDAP at work 2013...   Past Surgical History  Procedure Date  . Sphincterotomy   . Shoulder arthroscopy     right  . Other surgical history     arthroscopic subacromial decompression  . Other surgical history     open resection, distal right clavicle  . Knee surgery 09/2010    left knee    =>  s/p left knee arthroscopy 4/12 by DrMurphy      Outpatient Encounter Prescriptions as of 01/29/2013  Medication Sig Dispense Refill  . ALPRAZolam (XANAX) 0.5 MG tablet Take 1 tablet (0.5 mg total) by mouth 3 (three) times daily as needed for anxiety. For nerves  90 tablet  5  . Apple Cider Vinegar 500 MG TABS Take by mouth. 4 tsp       . aspirin 81 MG tablet Take 81 mg by mouth daily.        . Cinnamon 500 MG capsule Take 2,000 mg by mouth daily.       Marland Kitchen EXFORGE HCT 10-320-25 MG TABS TAKE 1 TABLET BY MOUTH ONCE A DAY  90 tablet  4  . Ginkgo Biloba 100 MG CAPS Take 60 mg by mouth daily.       Marland Kitchen HYDROcodone-acetaminophen (NORCO/VICODIN) 5-325 MG per tablet Take 1 tablet by mouth 3 (three) times daily as needed for pain.  90 tablet  5  . hydrocortisone (PROCTOSOL HC) 2.5 % rectal cream Apply as directed after each BM       . meloxicam (MOBIC) 15 MG tablet Take 1 tablet (15 mg total) by mouth daily as needed for pain.  30 tablet  6   . Multiple Vitamin (MULTIVITAMIN) tablet Take 1 tablet by mouth daily.        . niacin 500 MG CR capsule Take 500 mg by mouth 2 (two) times daily.       Marland Kitchen omeprazole (PRILOSEC) 20 MG capsule TAKE 1 CAPSULE BY MOUTH ONCE A DAY  90 capsule  4  . sertraline (ZOLOFT) 25 MG tablet TAKE 1 TABLET BY MOUTH EVERY DAY  90 tablet  2  . sildenafil (VIAGRA) 100 MG tablet Take as directed  10 tablet  5  . simvastatin (ZOCOR) 40 MG tablet TAKE 1 TABLET BY MOUTH AT BEDTIME  90 tablet  4  . testosterone cypionate (DEPOTESTOTERONE CYPIONATE) 200  MG/ML injection       . testosterone enanthate (DELATESTRYL) 200 MG/ML injection Inject into the muscle every 14 (fourteen) days. For IM use only       No facility-administered encounter medications on file as of 01/29/2013.    No Known Allergies   Current Medications, Allergies, Past Medical History, Past Surgical History, Family History, and Social History were reviewed in [employer] record.    Review of Systems         See HPI - all other systems neg except as noted... The patient complains of dyspnea on exertion.  The patient denies anorexia, fever, weight loss, weight gain, vision loss, decreased hearing, hoarseness, chest pain, syncope, peripheral edema, prolonged cough, headaches, hemoptysis, abdominal pain, melena, hematochezia, severe indigestion/heartburn, hematuria, incontinence, muscle weakness, suspicious skin lesions, transient blindness, difficulty walking, depression, unusual weight change, abnormal bleeding, enlarged lymph nodes, and angioedema.     Objective:   Physical Exam     WD, Obese, 62 y/o WM in NAD... GENERAL:  Alert & oriented; pleasant & cooperative. HEENT:  [place]/AT, EOM-wnl, PERRLA, EACs-clear, TMs-wnl, NOSE-clear, THROAT-clear & wnl. NECK:  Supple w/ fairROM; no JVD; normal carotid impulses w/o bruits; no thyromegaly or nodules palpated; no lymphadenopathy. CHEST:  Clear to P & A; without wheezes/ rales/ or  rhonchi. HEART:  Regular Rhythm; gr 1/6 SEM, without rubs or gallops... ABDOMEN:  Obese soft & nontender; normal bowel sounds; no organomegaly or masses detected.  EXT: without deformities, mild arthritic changes; no varicose veins/ +venous insuffic/ no edema.right knee w/ ace wrap.  NEURO:  CN's intact; no focal neuro deficits... DERM: diffuse sl excoriated papular rash on legs, ankles, feet, groin, buttocks, back, chest> c/w scabies...  RADIOLOGY DATA:  Reviewed in the EPIC EMR & discussed w/ the patient...  LABORATORY DATA:  Reviewed in the EPIC EMR & discussed w/ the patient...   Assessment & Plan:    Prob Scabies>> Rx w/ Kwell lotion per protocol...   OSA>  On CPAP per [facility] & pt informs me he has f/u appt soon...  Asthma>  Still smoking cigars & needs to quit, we discussed smoking cessation but he doesn't want med help etc...  HBP>  Controlled on the ExforgeHCT Rx, he likes the one pill;  discussed the need to lose wt, no salt, etc...  Hyperlipidemia>  FLPs reviewed> if he wants to improve these numbers MUST get on diet & get wt down!!!  OBESITY>  As above!!!  GU>  Per DrDahlstedt on Testos shots & Viagra...  DJD>  S/p arthroscopic surg left knee by DrMurphy & feeling better already...   Patient's Medications  New Prescriptions    ATARAX 25mg  - one tab every 4h as needed for itching...    KWELL  LOTION >> apply as directed...  Previous Medications   ALPRAZOLAM (XANAX) 0.5 MG TABLET    Take 1 tablet (0.5 mg total) by mouth 3 (three) times daily as needed for anxiety. For nerves   APPLE CIDER VINEGAR 500 MG TABS    Take by mouth. 4 tsp    ASPIRIN 81 MG TABLET    Take 81 mg by mouth daily.     CINNAMON 500 MG CAPSULE    Take 2,000 mg by mouth daily.    EXFORGE HCT 10-320-25 MG TABS    TAKE 1 TABLET BY MOUTH ONCE A DAY   GINKGO BILOBA 100 MG CAPS    Take 60 mg by mouth daily.    HYDROCODONE-ACETAMINOPHEN (NORCO/VICODIN) 5-325  MG PER TABLET    Take 1 tablet by mouth 3  (three) times daily as needed for pain.   HYDROCORTISONE (PROCTOSOL HC) 2.5 % RECTAL CREAM    Apply as directed after each BM    MELOXICAM (MOBIC) 15 MG TABLET    Take 1 tablet (15 mg total) by mouth daily as needed for pain.   MULTIPLE VITAMIN (MULTIVITAMIN) TABLET    Take 1 tablet by mouth daily.     NIACIN 500 MG CR CAPSULE    Take 500 mg by mouth 2 (two) times daily.    OMEPRAZOLE (PRILOSEC) 20 MG CAPSULE    TAKE 1 CAPSULE BY MOUTH ONCE A DAY   SERTRALINE (ZOLOFT) 25 MG TABLET    TAKE 1 TABLET BY MOUTH EVERY DAY   SILDENAFIL (VIAGRA) 100 MG TABLET    Take as directed   SIMVASTATIN (ZOCOR) 40 MG TABLET    TAKE 1 TABLET BY MOUTH AT BEDTIME   TESTOSTERONE ENANTHATE (DELATESTRYL) 200 MG/ML INJECTION    Inject into the muscle every 14 (fourteen) days. For IM use only  Modified Medications   No medications on file  Discontinued Medications   TESTOSTERONE CYPIONATE (DEPOTESTOTERONE CYPIONATE) 200 MG/ML INJECTION

## 2013-01-29 NOTE — Telephone Encounter (Signed)
Have pt come in today at 330 for appt.  thanks

## 2013-01-29 NOTE — Telephone Encounter (Signed)
I spoke with the pt and he states that he had what looked like bug bites on his ankle and they turned into blisters that spread up his leg and to his toes. He states after a few days he noticed one on his penis as well. He states the one on his penis looked like a black head. He states he cannot seem to get this one to heal and he is concerned. He denies any pain, burning with urination. He states he has had unprotected intercourse. Pt is requesting an appt with SN ONLY for this issue. Please advise. Carron Curie, CMA No Known Allergies

## 2013-01-29 NOTE — Patient Instructions (Addendum)
Today we updated your med list in our EPIC system...    Continue your current medications the same...  Yuor rash looks like a mite infestation called SCABIES>>    Apply the KWELL lotion as directed>>       Bathe w/ warm water & soap...       Rinse off well & dry completely...       Apply the Kwell lotion all over your body & extra on the lesions you have...       Leave this on for 12h then shower it off...       That should do the trick but if not completely resolved, the repeat the above after 7 days (2nd application is sometimes necessary)...       Use the ATARAX (Hydroxyzine) 25mg  every 4h as needed for itching...  Call for any questions.Marland KitchenMarland Kitchen

## 2013-02-13 ENCOUNTER — Telehealth: Payer: Self-pay | Admitting: Pulmonary Disease

## 2013-02-13 DIAGNOSIS — B86 Scabies: Secondary | ICD-10-CM

## 2013-02-13 MED ORDER — LINDANE 1 % EX LOTN: TOPICAL_LOTION | Freq: Once | CUTANEOUS | Status: DC

## 2013-02-13 NOTE — Telephone Encounter (Signed)
Patient Instructions    Today we updated your med list in our EPIC system...  Continue your current medications the same...  Yuor rash looks like a mite infestation called SCABIES>>  Apply the KWELL lotion as directed>>  Bathe w/ warm water & soap...  Rinse off well & dry completely...  Apply the Kwell lotion all over your body & extra on the lesions you have...  Leave this on for 12h then shower it off...  That should do the trick but if not completely resolved, the repeat the above after 7 days (2nd application is sometimes necessary)...  Use the ATARAX (Hydroxyzine) 25mg  every 4h as needed for itching...  Call for any questions...   Called spoke with patient who stated that he has followed SN's recs as above, but the rash has not resolved.  Pt is asking to be referred to dermatology.  Would like to be seen Monday 9.15.14 or ASAP.  Would also like a refill on KWELL lotion to last until dermatology appt.  Dr Kriste Basque please advise, thank you.

## 2013-02-13 NOTE — Telephone Encounter (Signed)
Rx sent to pharm  Referral to [facility] made Pt aware

## 2013-02-13 NOTE — Telephone Encounter (Signed)
Called and spoke with pt and he is aware that we will call tomorrow to get this appt with Dr. Terri Piedra on 10-01.  We will call pt back with this infor tomorrow.  Pt stated that he wants to be placed on the cancellation list at the derm office if they have one.

## 2013-02-13 NOTE — Telephone Encounter (Signed)
Per SN---  Ok for 1 refill of the cream Referral for the pt to see dermatology for further eval.

## 2013-02-13 NOTE — Telephone Encounter (Signed)
Leigh, did you call the pt again?

## 2013-03-13 ENCOUNTER — Ambulatory Visit (INDEPENDENT_AMBULATORY_CARE_PROVIDER_SITE_OTHER): Payer: BC Managed Care – PPO | Admitting: Cardiovascular Disease

## 2013-03-13 ENCOUNTER — Encounter: Payer: Self-pay | Admitting: Cardiovascular Disease

## 2013-03-13 VITALS — BP 138/84 | HR 80 | Ht 72.0 in | Wt 308.0 lb

## 2013-03-13 DIAGNOSIS — R0789 Other chest pain: Secondary | ICD-10-CM

## 2013-03-13 DIAGNOSIS — R079 Chest pain, unspecified: Secondary | ICD-10-CM

## 2013-03-13 DIAGNOSIS — I10 Essential (primary) hypertension: Secondary | ICD-10-CM

## 2013-03-13 DIAGNOSIS — E785 Hyperlipidemia, unspecified: Secondary | ICD-10-CM

## 2013-03-13 NOTE — Patient Instructions (Signed)
You are doing well. No medication changes were made.  Ask the pharmacist about the cost of spliting the exforge into: Amlodipine  10 mg HCTZ   25 mg Valsartan  320 mg  or losartan  100 mg  Please call us if you have new issues that need to be addressed before your next appt.  Your physician wants you to follow-up in: 12 months.  You will receive a reminder letter in the mail two months in advance. If you don't receive a letter, please call our office to schedule the follow-up appointment.

## 2013-03-13 NOTE — Assessment & Plan Note (Signed)
Blood pressure is well controlled on today's visit. No changes made to the medications. 

## 2013-03-13 NOTE — Assessment & Plan Note (Signed)
We have encouraged continued exercise, careful diet management in an effort to lose weight. 

## 2013-03-13 NOTE — Assessment & Plan Note (Signed)
Reproducible chest pain with palpation. Likely musculoskeletal. 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 This could have caused his chest discomfort. We have recommended ice and Aleve.

## 2013-03-13 NOTE — Progress Notes (Signed)
Patient ID: Dakota Ellison, male    DOB: 21-May-1951, 62 y.o.   MRN: [id number]  HPI Comments: Mr. Harty is a pleasant 62 year old gentleman with a history of obesity, obstructive sleep apnea, hypertension, hyperlipidemia who presents for routine followup.   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 Since then has had right sided mediastinal chest pain that comes and goes. Worse with palpation. No significant exacerbation with exercise. He does not do a regular exercise program. No significant shortness of breath with exertion. He is able to tolerate his CPAP.  Total cholesterol 128, LDL 59. Tolerating the statin.  Last echocardiogram in March 2008 showing normal systolic function, right ventricle mildly dilated, mild mitral regurg, mild tricuspid regurg. Last stress test in March 2008 where he achieved 10 mets, exercised for 9 minutes, no ischemia noted. Overall a normal,  low-risk scan.   EKG shows normal sinus rhythm with rate 80 beats per minute, no significant ST-T wave changes    Outpatient Encounter Prescriptions as of 03/13/2013  Medication Sig Dispense Refill  . ALPRAZolam (XANAX) 0.5 MG tablet Take 1 tablet (0.5 mg total) by mouth 3 (three) times daily as needed for anxiety. For nerves  90 tablet  5  . aspirin 81 MG tablet Take 81 mg by mouth daily.        . Cinnamon 500 MG capsule Take 2,000 mg by mouth daily.       Marland Kitchen EXFORGE HCT 10-320-25 MG TABS TAKE 1 TABLET BY MOUTH ONCE A DAY  90 tablet  4  . Ginkgo Biloba 100 MG CAPS Take 60 mg by mouth daily.       Marland Kitchen HYDROcodone-acetaminophen (NORCO/VICODIN) 5-325 MG per tablet Take 1 tablet by mouth 3 (three) times daily as needed for pain.  90 tablet  5  . hydrocortisone (PROCTOSOL HC) 2.5 % rectal cream Apply as directed after each BM       . lindane lotion (KWELL) 1 % Apply topically once. As directed  30 mL  0  . meloxicam (MOBIC) 15 MG tablet Take 1 tablet (15 mg total) by mouth daily as  needed for pain.  30 tablet  6  . Multiple Vitamin (MULTIVITAMIN) tablet Take 1 tablet by mouth daily.        . niacin 500 MG CR capsule Take 500 mg by mouth 2 (two) times daily.       Marland Kitchen omeprazole (PRILOSEC) 20 MG capsule TAKE 1 CAPSULE BY MOUTH ONCE A DAY  90 capsule  4  . sertraline (ZOLOFT) 25 MG tablet TAKE 1 TABLET BY MOUTH EVERY DAY  90 tablet  2  . sildenafil (VIAGRA) 100 MG tablet Take as directed  10 tablet  5  . simvastatin (ZOCOR) 40 MG tablet TAKE 1 TABLET BY MOUTH AT BEDTIME  90 tablet  4  . testosterone enanthate (DELATESTRYL) 200 MG/ML injection Inject into the muscle every 14 (fourteen) days. For IM use only      . [DISCONTINUED] Apple Cider Vinegar 500 MG TABS Take by mouth. 4 tsp        No facility-administered encounter medications on file as of 03/13/2013.     Review of Systems  Constitutional: Negative.   HENT: Negative.   Eyes: Negative.   Respiratory: Negative.   Cardiovascular: Positive for chest pain.  Gastrointestinal: Negative.   Endocrine: Negative.   Musculoskeletal: Negative.   Skin: Negative.   Allergic/Immunologic: Negative.   Neurological: Negative.  Hematological: Negative.   Psychiatric/Behavioral: Negative.   All other systems reviewed and are negative.    BP 138/84  Pulse 80  Ht 6' (1.829 m)  Wt 308 lb (139.708 kg)  BMI 41.76 kg/m2  Physical Exam  Nursing note and vitals reviewed. Constitutional: He is oriented to person, place, and time. He appears well-developed and well-nourished.  obese  HENT:  Head: Normocephalic.  Nose: Nose normal.  Mouth/Throat: Oropharynx is clear and moist.  Eyes: Conjunctivae are normal. Pupils are equal, round, and reactive to light.  Neck: Normal range of motion. Neck supple. No JVD present.  Cardiovascular: Normal rate, regular rhythm, S1 normal, S2 normal, normal heart sounds and intact distal pulses.  Exam reveals no gallop and no friction rub.   No murmur heard. Pulmonary/Chest: Effort normal and  breath sounds normal. No respiratory distress. He has no wheezes. He has no rales. He exhibits no tenderness.  Abdominal: Soft. Bowel sounds are normal. He exhibits no distension. There is no tenderness.  Musculoskeletal: Normal range of motion. He exhibits no edema and no tenderness.  Lymphadenopathy:    He has no cervical adenopathy.  Neurological: He is alert and oriented to person, place, and time. Coordination normal.  Skin: Skin is warm and dry. No rash noted. No erythema.  Psychiatric: He has a normal mood and affect. His behavior is normal. Judgment and thought content normal.      Assessment and Plan

## 2013-03-13 NOTE — Assessment & Plan Note (Signed)
Cholesterol is at goal on the current lipid regimen. No changes to the medications were made.  

## 2013-04-01 ENCOUNTER — Encounter: Payer: Self-pay | Admitting: Pulmonary Disease

## 2013-04-01 ENCOUNTER — Ambulatory Visit (INDEPENDENT_AMBULATORY_CARE_PROVIDER_SITE_OTHER): Payer: BC Managed Care – PPO | Admitting: Pulmonary Disease

## 2013-04-01 ENCOUNTER — Other Ambulatory Visit (INDEPENDENT_AMBULATORY_CARE_PROVIDER_SITE_OTHER): Payer: BC Managed Care – PPO

## 2013-04-01 VITALS — BP 134/80 | HR 84 | Temp 98.6°F | Ht 72.0 in | Wt 306.8 lb

## 2013-04-01 DIAGNOSIS — IMO0002 Reserved for concepts with insufficient information to code with codable children: Secondary | ICD-10-CM

## 2013-04-01 DIAGNOSIS — E119 Type 2 diabetes mellitus without complications: Secondary | ICD-10-CM

## 2013-04-01 DIAGNOSIS — F341 Dysthymic disorder: Secondary | ICD-10-CM

## 2013-04-01 DIAGNOSIS — M1990 Unspecified osteoarthritis, unspecified site: Secondary | ICD-10-CM

## 2013-04-01 DIAGNOSIS — I10 Essential (primary) hypertension: Secondary | ICD-10-CM

## 2013-04-01 DIAGNOSIS — K219 Gastro-esophageal reflux disease without esophagitis: Secondary | ICD-10-CM

## 2013-04-01 DIAGNOSIS — G4733 Obstructive sleep apnea (adult) (pediatric): Secondary | ICD-10-CM

## 2013-04-01 DIAGNOSIS — E291 Testicular hypofunction: Secondary | ICD-10-CM

## 2013-04-01 DIAGNOSIS — E785 Hyperlipidemia, unspecified: Secondary | ICD-10-CM

## 2013-04-01 LAB — BASIC METABOLIC PANEL
Calcium: 9.5 mg/dL (ref 8.4–10.5)
GFR: 72.97 mL/min (ref >60.00)
Sodium: 139 mEq/L (ref 135–145)

## 2013-04-01 MED ORDER — HYDROCODONE-ACETAMINOPHEN 5-325 MG PO TABS: 1.0000 | ORAL_TABLET | Freq: Three times a day (TID) | ORAL | Status: DC | PRN

## 2013-04-01 MED ORDER — ALPRAZOLAM 0.5 MG PO TABS: 0.5000 mg | ORAL_TABLET | Freq: Three times a day (TID) | ORAL | Status: DC | PRN

## 2013-04-01 NOTE — Patient Instructions (Signed)
Today we updated your med list in our EPIC system...    Continue your current medications the same...  We refilled the meds you requested...  Today we rechecked your DM labs...    We will contact you w/ the results when available...   Let's get on track w/ our diet & weight reduction program!!!  Call for any questions...  Let's plan a follow up visit in 71mo, sooner if needed for problems.Marland KitchenMarland Kitchen

## 2013-04-01 NOTE — Progress Notes (Signed)
Subjective:    Patient ID: Dakota Ellison, male    DOB: 1950-08-01, 62 y.o.   MRN: [id number]  HPI 62 y/o WM here for an add-on visit... he has multiple medical problems as noted below...    ~  October 03, 2011:  221mo ROV & [name] is doing reasonably well- he notes an intertrig rash on abd & we will Rx w/ Lotrisone cream;  We reviewed prob list, meds, xrays & labs> see below>>  LABS 4/13:  FLP- at goals on Simva40;  Chems- ok w/ BS=106;  CBC- wnl;  TSH=2.20;  PSA=1.44  ~  October 28. 2013:  221mo ROV & [name] is noting increased difficulty getting about (stiff joints) but his wt is up to 322# & he knows the critically important need to lose wt.     OSA> he had an AHI=82 in 2010 & started on CPAP20 by [facility] & he continues follow up there (we do not have notes)...    Hx mild asthma> he still smokes an occas cigar & asked to quit completely!!! He states no attacks in yrs & breathing is OK- denies CP, palpit, SOB, edema...    HBP> on ExforgeHCT & K+; BP today= 118/76 & similar at work; he remains asymptomatic...    Hyperlipid> on Simva40 + Niaspan; last FLP 4/13 looked good; he understands that improvement here requires him to lose weight!    Obesity> wt= 322# which is up 8# over the last 221mo; we reviewed diet/ exercise/ wt reduction program...    GI> GERD, Rectal fissure> on Prilosec20 & AnusolHC as needed; he is overdue for a f/u colonoscopy & will contact GI for this screening procedure...    GU> Low-T & ED>  He takes a bunch of vits & herbs including chromium, cinnamon, vinegar, MVI, etc; DrDahlstedt treats him w/ Testos shots & Viagra.    DJD> he uses OTC analgesics and Vicodin as needed...    Depression> he remains on Zoloft25 & Xanax as needed... We reviewed prob list, meds, xrays and labs> see below for updates >> he's already had the 2013 Flu vaccine; requests Vicodin & Xanax refills today.  LABS 10/13:  Chems- ok w/ BS106...   ~  October 01, 2012:  221mo ROV & [name] has an appt coming up w/  DrGerkin, [facility] to see about his inguinal hernias R>L; he is also interested in discussing poss Bariatric Surg;  He notes that his weight is about the same but he has been unable to lose;  Resting well w/ CPAP from [facility], BP remains controlled on hisExforgeHCT, Chol has been good on the Simva40, etc...     We reviewed prob list, meds, xrays and labs> see below for updates >>   LABS 4/14:  FLP- ok x TG=167 on Simva40 & needs better low fat diet & wt reduction;  Chems- wnl w/ BS=94 & A1c=5.9;  CBC- wnl;  TSH=2.39;  PSA=1.30...  ~  January 29, 2013:  21mo ROV & acute visit request add-on for puritic rash> states it started several weeks ago, red sl excoriated rash on legs/ feet/ ankles (including betw the toes), groin, one on penis, buttock, back, chest; very pruritic- esp at night & discrete macules/ papules are all c/w scabies...  Discussed w/ pt & rec treatment w/ KWELL lotion (wash, dry, apply for 12h & wash off, repeat process in 1wk if not completely resolved; also wrote for Atarax25mg  prn itching....    He saw DrGerkin 5/14> he felt the  right inguinal hernia but could not find a recurrent  left sided hernia on exam (prev [facility] repair 1998); they did CT Pelvis- showed RIH containing fat & prev LIH repair w/o recurrence, min calcif atherosclerotic changes noted...     Faith saw [facility] 6/14 for colonoscopy> 2 sm sigmoid polyps removed, int hems, & we don't have path reports; pt told to f/u in 5-58yrs...    He had a f/u visit w/ DrDahlstedt 6/14> hypogonadism on testos shots 200mg /ml- getting 1ml every other week & Viagra prn; they checked UA, CBC, Prolactin, Testos level & PSA- we do not have their lab data... We reviewed prob list, meds, xrays and labs> see below for updates >>   ~  April 01, 2013:  93mo ROV & [name] requests Rx for new CPAP machine- his is broken- but studies & prev Rx per [facility] and we discussed this    OSA> he had an AHI=82 in 2010 & started on CPAP20 by [facility] & he  continues follow up there (we do not have notes)...    Hx mild asthma> he still smokes an occas cigar & asked to quit completely!!! He states no attacks in yrs & breathing is OK, not on meds- denies CP, palpit, SOB, edema...    HBP> on ExforgeHCT & K+; BP today= 134/80 & similar at work; he remains asymptomatic...    Hyperlipid> on Simva40 + Niaspan; last FLP 4/14 looked ok; he understands that improvement here requires him to lose weight!    Obesity> wt= 307# which is up 7# over the interval; we reviewed diet/ exercise/ wt reduction program...    GI> GERD, Rectal fissure> on Prilosec20 & AnusolHC as needed; he saw [facility] 6/14 for colonoscopy> 2 sm sigmoid polyps removed, int hems, & we don't have path reports; pt told to f/u in 5-40yrs    GU> Low-T & ED>  He takes a bunch of vits & herbs including chromium, cinnamon, vinegar, MVI, etc; DrDahlstedt treats him w/ Testos shots & Viagra.    DJD> he uses OTC analgesics, Mobic15, and Vicodin as needed...    Depression> he remains on Zoloft25 & Xanax0.5 as needed... We reviewed prob list, meds, xrays and labs> see below for updates >> given 2014 Flu vaccine; he requests refill for his Vicodin & Xanax...  LABS 10/14:  Chems- ok x BS=113, A1c=6.0 (OK on diet alone)...           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 it got worse and was sent to DrWolicki who hospitalized him 3/08 w/ MRSA infection- Rx's w/ Vancomycin infusions... now resolved.  OBSTRUCTIVE SLEEP APNEA (ICD-327.23) - Apr10> had Sleep Study at [facility]> pos for OSA w/ AHI= 82, desat to 85%... split night study w/ good control on CPAP 20 (see report)... started on CPAP by [facility] & still followed by him...  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 no recent exac and no regular meds... told to discontinue all smoking> denies much cough, sputum, dyspnea, CP,  etc... ~  CXR 2/09 showed normal heart size, clear lungs, degen changes in Tspine, NAD...   HYPERTENSION (ICD-401.9) - followed by DrGollan/ [facility]> on ECASA 325mg /d & EXFORGE 10/320/25 daily + KCl 73mEq/d... he had CP while hosp w/ the MRSA 2008 and DrWolicki consulted DrMcQueen who did a NuclearStressTest 3/08 which was normal... and a 2DEcho which showed mild MR  and nl LVF... ~ 10/12:  f/u visit DrGollan at [facility]> note reviewed, no changes made, DrGollan desperately wants him to lose weight! ~  10/12:  BP= 124/76 doing well, remains asymptomatic... ~  4/13:  BP= 120/78 & he denies CP, palpit, SOB, edema, etc... ~  10/13:  BP= 118/76 & he remains asymptomatic; EKG showed NSR, rate86, wnl, NAD... ~  4/14:  BP= 134/80 & he denies CP, palpit, dizzy, SOB, edema... ~  8/14:  BP= 120/82 & he remains asymptomatic...  HYPERLIPIDEMIA (ICD-272.4) - he's been switched around by [facility] betw Simva/ Caduet/ back to SIMVASTATIN 40mg /d now + NIACIN 500mg Bid + FISH OIL 1000mg Bid...  still not on any kind of diet but his weight is stable at 294#.Marland Kitchen. ~  FLP 1/08 showed TChol 195, TG 119, HDL 41, LDL 131 ~  FLP 2/09 showed TChol 214, TG 214, HDL 39, LDL 127 ~  12/09: recent labs by [facility]- we don't have these results... ~  FLP 5/10 on 3 meds above showed TChol 148, TG 81, HDL 48, LDL 84 ~  FLP 11/10 on 3 meds showed TChol 135, TG 145, HDL 36, LDL 70 ~  FLP 5/11 showed TChol 145, TG 128, HDL 43, LDL 77... keep same Rx. ~  FLP 10/11 showed TChol 152, TG 198, HDL 41, LDL 72... same meds, better diet, get wt down! ~  FLP 4/12 showed TChol 141, TG 200, HDL 39, LDL 62... Similar> needs better low fat diet, get wt down. ~  10/12:  FLP not rechecked today as he has not lost wt etc... ~  FLP 4/13 on Simva40+Niasp1000 showed TChol 137, TG 124, HDL 43, LDL 69 ~  FLP 4/14 on Simva40+Niasp1000 showed TChol 128, TG 167, HDL 35, LDL 59  MORBID OBESITY (ICD-278.01) - despite all efforts weight remains >300#...  discussed wt watchers, nutrition center, poss bariatric approach... he's not been able to exercise but will join the Y & start diet program! ~  5/11:  he reports that DrElkins gave him 30d supply of Phenteramine which really helped- lost 20#, but put it all back + 9# more off this med... we discussed the dangers of diet pills & rec diet + exercise, neither of which he has tried in earnest! ~  10/11:  weight up 15# to 314# today> discussed diet, exercise, etc... ~  4/12:  Weight = 309# and needs to do better w/ diet/ exercise... ~  10/12:  Weight = 314# and we reviewed diet, exercise, wt loss program... ~  4/13:  Weight = 311# ~  10/13:  Weight = 322# ~  4/14:  Weight = 320# ~  8/14:  Weight = 307#  GERD (ICD-530.81) - he had an EGD from [facility] 4/03 which was neg... he takes OMEPRAZOLE 20mg /d for reflux symptoms which works well...  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 prev tried Diltiazem cream, now ANUSOL HC etc... COLON POLYPS >>  ~  pt reports colonoscopy by [facility] 7/03- reported neg & f/u planned 5 yrs> he is overdue & will call for f/u exam... ~  Quentin saw [facility] 6/14 for colonoscopy> 2 sm sigmoid polyps removed, int hems, & we don't have path reports; pt told to f/u in 5-34yrs...  TESTOSTERONE DEFICIENCY (ICD-257.2) &  ERECTILE DYSFUNCTION (ICD-302.72) - prev on TESTIM Gel 1%= 5gm daily and followed by DrDahlstadt> changed to Testopel Implants, but he tells me these are back ordered & he was getting Testos shots thru the Urology  office... also takes Viagra Prn...  ~  labs 5/10 showed PSA = 0.85 ~  labs 5/11 showed PSA= 0.65 ~  Labs 4/12 showed PSA= 0.79 ~  Labs 4/13 showed PSA= 1.44 ~  9/13: seen by DrDahlstedt> treated for hypogonadism w/ Testopel, then Testos shots but he was irregular w/ his visits; prostate exam was wnl; they tried to teach him self injected Testos Q2weeks...  ~  Labs 4/14 showed PSA= 1.30 ~  He had a f/u visit w/  DrDahlstedt 6/14> hypogonadism on testos shots 200mg /ml- getting 1ml every other week & Viagra prn; they checked UA, CBC, Prolactin, Testos level & PSA- we do not have their lab data.   OSTEOARTHRITIS (ICD-715.90) DEGENERATIVE DISC DISEASE (ICD-722.6) - followed by DrMurphy for right knee pain... prev seen by Mayford Knife for DDD and LBP... ~  s/p left CTS surg 2009 ~  s/p right knee arthroscopy by DrMurphy 2/10... also had cortisone shot in knee 4/10 ~  s/p trigger finger injections by DrGramig ~  4/12:  s/p left knee arthroscopy by DrMurphy> improved...  DYSTHYMIA (ICD-300.4) - some stress, some depression which is mild by his determination... he tried Lexapro last year but didn't follow up... now using Xanax as needed, and started ZOLOFT 25mg /d in Feb10- he reports better, wants to continue this dose.  HEALTH MAINTENANCE: ~  GI:  He saw [facility] 6/14 for f/u colonoscopy w/ 2 polyps removed- we don't have path. ~  GU:  Followed by DrDahlstedt as above... ~  Immuniz:  He gets Flu shot each yr at work; he had TDAP at work 2013...   Past Surgical History  Procedure Date  . Sphincterotomy   . Shoulder arthroscopy     right  . Other surgical history     arthroscopic subacromial decompression  . Other surgical history     open resection, distal right clavicle  . Knee surgery 09/2010    left knee    =>  s/p left knee arthroscopy 4/12 by DrMurphy      Outpatient Encounter Prescriptions as of 04/01/2013  Medication Sig Dispense Refill  . ALPRAZolam (XANAX) 0.5 MG tablet Take 1 tablet (0.5 mg total) by mouth 3 (three) times daily as needed for anxiety. For nerves  90 tablet  5  . aspirin 81 MG tablet Take 81 mg by mouth daily.        . Cinnamon 500 MG capsule Take 2,000 mg by mouth daily.       Marland Kitchen EXFORGE HCT 10-320-25 MG TABS TAKE 1 TABLET BY MOUTH ONCE A DAY  90 tablet  4  . Garlic 100 MG TABS Take 1 tablet by mouth 2 (two) times daily.      . Ginkgo Biloba 100 MG CAPS Take 60 mg by  mouth daily.       Marland Kitchen HYDROcodone-acetaminophen (NORCO/VICODIN) 5-325 MG per tablet Take 1 tablet by mouth 3 (three) times daily as needed for pain.  90 tablet  5  . hydrocortisone (PROCTOSOL HC) 2.5 % rectal cream Apply as directed after each BM       . lindane lotion (KWELL) 1 % Apply topically once. As directed  30 mL  0  . meloxicam (MOBIC) 15 MG tablet Take 1 tablet (15 mg total) by mouth daily as needed for pain.  30 tablet  6  . Multiple Vitamin (MULTIVITAMIN) tablet Take 1 tablet by mouth daily.        . niacin 500 MG CR capsule Take 500 mg by  mouth 2 (two) times daily.       Marland Kitchen omeprazole (PRILOSEC) 20 MG capsule TAKE 1 CAPSULE BY MOUTH ONCE A DAY  90 capsule  4  . sertraline (ZOLOFT) 25 MG tablet TAKE 1 TABLET BY MOUTH EVERY DAY  90 tablet  2  . sildenafil (VIAGRA) 100 MG tablet Take as directed  10 tablet  5  . simvastatin (ZOCOR) 40 MG tablet TAKE 1 TABLET BY MOUTH AT BEDTIME  90 tablet  4  . testosterone enanthate (DELATESTRYL) 200 MG/ML injection Inject into the muscle every 14 (fourteen) days. For IM use only       No facility-administered encounter medications on file as of 04/01/2013.    No Known Allergies   Current Medications, Allergies, Past Medical History, Past Surgical History, Family History, and Social History were reviewed in [employer] record.    Review of Systems         See HPI - all other systems neg except as noted... The patient complains of dyspnea on exertion.  The patient denies anorexia, fever, weight loss, weight gain, vision loss, decreased hearing, hoarseness, chest pain, syncope, peripheral edema, prolonged cough, headaches, hemoptysis, abdominal pain, melena, hematochezia, severe indigestion/heartburn, hematuria, incontinence, muscle weakness, suspicious skin lesions, transient blindness, difficulty walking, depression, unusual weight change, abnormal bleeding, enlarged lymph nodes, and angioedema.     Objective:   Physical  Exam     WD, Obese, 62 y/o WM in NAD... GENERAL:  Alert & oriented; pleasant & cooperative. HEENT:  [place]/AT, EOM-wnl, PERRLA, EACs-clear, TMs-wnl, NOSE-clear, THROAT-clear & wnl. NECK:  Supple w/ fairROM; no JVD; normal carotid impulses w/o bruits; no thyromegaly or nodules palpated; no lymphadenopathy. CHEST:  Clear to P & A; without wheezes/ rales/ or rhonchi. HEART:  Regular Rhythm; gr 1/6 SEM, without rubs or gallops... ABDOMEN:  Obese soft & nontender; normal bowel sounds; no organomegaly or masses detected.  EXT: without deformities, mild arthritic changes; no varicose veins/ +venous insuffic/ no edema.right knee w/ ace wrap.  NEURO:  CN's intact; no focal neuro deficits... DERM: diffuse sl excoriated papular rash on legs, ankles, feet, groin, buttocks, back, chest> c/w scabies...  RADIOLOGY DATA:  Reviewed in the EPIC EMR & discussed w/ the patient...  LABORATORY DATA:  Reviewed in the EPIC EMR & discussed w/ the patient...   Assessment & Plan:    OSA>  On CPAP per [facility] & pt informs me he has f/u appt soon...  Asthma>  Still smoking cigars & needs to quit, we discussed smoking cessation but he doesn't want med help etc...  HBP>  Controlled on the ExforgeHCT Rx, he likes the one pill;  discussed the need to lose wt, no salt, etc...  Hyperlipidemia>  FLPs reviewed> if he wants to improve these numbers MUST get on diet & get wt down!!!  OBESITY>  As above!!!  GU>  Per DrDahlstedt on Testos shots & Viagra...  DJD>  S/p arthroscopic surg left knee by DrMurphy & feeling better already...  Prev treated w/ Kwell for prob scabies.Marland KitchenMarland Kitchen

## 2013-04-03 ENCOUNTER — Other Ambulatory Visit: Payer: Self-pay | Admitting: Adult Health

## 2013-04-25 ENCOUNTER — Telehealth: Payer: Self-pay | Admitting: Pulmonary Disease

## 2013-04-25 NOTE — Telephone Encounter (Signed)
Called and spoke with pt and he is aware that he can come by on Wednesday around 12 or after to pick up the rx for the hydrocodone.  Will hold this message until then.

## 2013-04-29 MED ORDER — HYDROCODONE-ACETAMINOPHEN 5-325 MG PO TABS: 1.0000 | ORAL_TABLET | Freq: Three times a day (TID) | ORAL | Status: DC | PRN

## 2013-04-29 NOTE — Telephone Encounter (Signed)
rx has been printed out and placed on SN cart to be signed. 

## 2013-04-30 NOTE — Telephone Encounter (Signed)
rx has been signed by SN and placed up front for the pt to come by and pick up.

## 2013-04-30 NOTE — Telephone Encounter (Signed)
Pt aware.

## 2013-05-07 ENCOUNTER — Telehealth: Payer: Self-pay | Admitting: Pulmonary Disease

## 2013-05-07 NOTE — Telephone Encounter (Signed)
Called and spoke with pt and he stated that he will need a note from SN---pt stated that his job has changed their policy and they are not allowed to have facial hair.  He stated that he shaved his goatee off and he stated that now his face is sensitive and he feels that this area feels chapped.  Pt is requesting that SN write a note for him to give to work so he may grow his facial hair out again.  SN please advise. thanks

## 2013-05-09 NOTE — Telephone Encounter (Signed)
Pt calling in ref to previous msg can be reached at [phone].Dakota Ellison

## 2013-05-09 NOTE — Telephone Encounter (Signed)
Pt aware we are working on this and will call once ready.

## 2013-05-10 ENCOUNTER — Encounter: Payer: Self-pay | Admitting: Pulmonary Disease

## 2013-05-10 NOTE — Telephone Encounter (Signed)
Letter has been completed by SN.  i have called and spoke with pt and he is aware.

## 2013-05-15 ENCOUNTER — Telehealth: Payer: Self-pay | Admitting: Pulmonary Disease

## 2013-05-15 NOTE — Telephone Encounter (Signed)
Spoke with pt and he stated that the letter that we did for him for the shaving issue at work, he really appreciates this but his sergent told him that he should wait a couple of weeks to turn this letter in since this rule just came into effect.  Pt stated that he wanted to see if we could just re- date  The letter will need to be dated for 06-10-2013.  Pt is aware that we will take care of this and call him once this is ready.

## 2013-06-05 ENCOUNTER — Telehealth: Payer: Self-pay | Admitting: Pulmonary Disease

## 2013-06-05 MED ORDER — HYDROCODONE-ACETAMINOPHEN 5-325 MG PO TABS: 1.0000 | ORAL_TABLET | Freq: Three times a day (TID) | ORAL | Status: DC | PRN

## 2013-06-05 NOTE — Telephone Encounter (Signed)
Called and spoke with pt and he stated he will come by Friday to pick this rx up.

## 2013-06-05 NOTE — Telephone Encounter (Signed)
rx has been printed out and placed on SN cart to be signed.  Will call pt once this is ready to be picked up.  

## 2013-06-05 NOTE — Telephone Encounter (Signed)
Called, spoke with pt.  He is requesting to pick up hydrocodone 5/325 rx. This rx was last printed on 04/29/13 tid prn #[id number]. Pt's Last OV with SN: 04/01/13; f/u in 6 months Pending OV with SN: 09/30/13. Dr. Kriste Basque, pls advise if rx is ok.   Thank you.

## 2013-06-12 ENCOUNTER — Telehealth: Payer: Self-pay | Admitting: Pulmonary Disease

## 2013-06-12 ENCOUNTER — Encounter: Payer: Self-pay | Admitting: *Deleted

## 2013-06-12 NOTE — Telephone Encounter (Signed)
Letter was done for pt to have the date of today.  Called and spoke with pt and he is aware and will come by to pick this up.

## 2013-06-27 ENCOUNTER — Telehealth: Payer: Self-pay

## 2013-06-27 NOTE — Telephone Encounter (Signed)
Dentist called and does dental sleep and would like a night guard for his sleep apnea.Pt is currently wearing a cpap and "hates it". Please call by 2 today, if not will be in on Monday

## 2013-06-27 NOTE — Telephone Encounter (Signed)
Left message on Dr. Alveda ReasonsFuller's voicemail that pt sees Dr. Kriste BasqueNadel for pulmonary and sleep study issues and for them to contact their office.

## 2013-07-01 ENCOUNTER — Telehealth: Payer: Self-pay | Admitting: *Deleted

## 2013-07-01 NOTE — Telephone Encounter (Signed)
Dr. Allyne GeeSanders called and stated that she has been seeing the pt and they have spoken about the pts CPAP.  Pt stated that he is not able  To tolerate the cpap machine but pt is interested in using an appliance at night.   Dr. Allyne GeeSanders explained to the pt that the oral appliance would not be enough to treat his with out the cpap.  Pt wanted to try the oral appliance, but Dr. Allyne GeeSanders wanted to check with SN to see what he thinks.  SN please advise. thanks

## 2013-07-01 NOTE — Telephone Encounter (Signed)
LMTCB for Dr Allyne GeeSanders

## 2013-07-01 NOTE — Telephone Encounter (Signed)
SN recs a consult with KC to discuss.   Dr. Allyne GeeSanders can be reached at [phone].

## 2013-07-02 ENCOUNTER — Telehealth: Payer: Self-pay | Admitting: Pulmonary Disease

## 2013-07-02 NOTE — Telephone Encounter (Signed)
[facility] and gave Dakota [facility]'s opinion to have [facility]  She states she will let pt know and get back with us regarding appt.

## 2013-07-05 ENCOUNTER — Telehealth: Payer: Self-pay | Admitting: Pulmonary Disease

## 2013-07-05 MED ORDER — HYDROCODONE-ACETAMINOPHEN 5-325 MG PO TABS: 1.0000 | ORAL_TABLET | Freq: Three times a day (TID) | ORAL | Status: DC | PRN

## 2013-07-05 NOTE — Telephone Encounter (Signed)
Last OV 04-01-13. Pt is requesting a refill on Vicodin. Last rx 06-05-13 #90. Please advise if ok to refill.Carron CurieJennifer Castillo, CMA

## 2013-07-05 NOTE — Telephone Encounter (Signed)
rx has been printed and placed up front for the pt to pick up.  Called and spoke with pt and he is aware.

## 2013-07-05 NOTE — Telephone Encounter (Signed)
rx has been printed out and placed on SN cart to be signed. 

## 2013-07-31 ENCOUNTER — Encounter: Payer: Self-pay | Admitting: Pulmonary Disease

## 2013-07-31 ENCOUNTER — Telehealth: Payer: Self-pay | Admitting: Pulmonary Disease

## 2013-07-31 ENCOUNTER — Ambulatory Visit (INDEPENDENT_AMBULATORY_CARE_PROVIDER_SITE_OTHER): Payer: BC Managed Care – PPO | Admitting: Pulmonary Disease

## 2013-07-31 VITALS — BP 112/70 | HR 108 | Temp 98.2°F | Ht 72.0 in | Wt 305.6 lb

## 2013-07-31 DIAGNOSIS — I10 Essential (primary) hypertension: Secondary | ICD-10-CM

## 2013-07-31 DIAGNOSIS — G4733 Obstructive sleep apnea (adult) (pediatric): Secondary | ICD-10-CM

## 2013-07-31 DIAGNOSIS — K219 Gastro-esophageal reflux disease without esophagitis: Secondary | ICD-10-CM

## 2013-07-31 DIAGNOSIS — E785 Hyperlipidemia, unspecified: Secondary | ICD-10-CM

## 2013-07-31 NOTE — Progress Notes (Signed)
Subjective:    Patient ID: Dakota Ellison, male    DOB: [birth year], 63 y.o.   MRN: [id number]  HPI The patient is a 63 year old male who I've been asked to see for management of obstructive sleep apnea. He was first diagnosed in 2010 with severe OSA, with an AHI of 82 events per hour. He underwent a CPAP titration, with his optimal pressure being 20 cm of water. He has been on CPAP since that time with a full face mask, and has done very well. He wears this every night, and feels rested in the mornings upon arising. He feels that his alertness during the day in the evening is normal. Despite this, he feels it is a big aggravation for him, especially on weekends when he travels out of town. The patient states that his weight has actually increased since his original study, and his Epworth score today is 6.  Sleep Questionnaire What time do you typically go to bed?( Between what hours) 1130p-12a 1130p-12a at 1526 on 07/31/13 by Maisie Fus, CMA How long does it take you to fall asleep? 10-38mins 10-61mins at 1526 on 07/31/13 by Maisie Fus, CMA How many times during the night do you wake up? 1 1 at 1526 on 07/31/13 by Maisie Fus, CMA What time do you get out of bed to start your day? No Value 630-7 at 1526 on 07/31/13 by Maisie Fus, CMA Do you drive or operate heavy machinery in your occupation? Yes Yes at 1526 on 07/31/13 by Maisie Fus, CMA How much has your weight changed (up or down) over the past two years? (In pounds) 0 oz (0 kg) 0 oz (0 kg) at 1526 on 07/31/13 by Maisie Fus, CMA Have you ever had a sleep study before? Yes Yes at 1526 on 07/31/13 by Maisie Fus, CMA If yes, location of study? [facility] at 1526 on 07/31/13 by Maisie Fus, CMA If yes, date of study? 2010 2010 at 1526 on 07/31/13 by Maisie Fus, CMA Do you currently use CPAP? Yes Yes at 1526 on 07/31/13 by Maisie Fus, CMA If so, what pressure? Do you wear oxygen at any time?  No No at 1526 on 07/31/13 by Maisie Fus, CMA   Review of Systems  Constitutional: Negative for fever and unexpected weight change.  HENT: Negative for congestion, dental problem, ear pain, nosebleeds, postnasal drip, rhinorrhea, sinus pressure, sneezing, sore throat and trouble swallowing.   Eyes: Negative for redness and itching.  Respiratory: Negative for cough, chest tightness, shortness of breath and wheezing.   Cardiovascular: Negative for palpitations and leg swelling.  Gastrointestinal: Negative for nausea and vomiting.  Genitourinary: Negative for dysuria.  Musculoskeletal: Negative for joint swelling.  Skin: Negative for rash.  Neurological: Negative for headaches.  Hematological: Does not bruise/bleed easily.  Psychiatric/Behavioral: Negative for dysphoric mood. The patient is not nervous/anxious.        Objective:   Physical Exam Constitutional:  Overweight male, no acute distress  HENT:  Nares patent without discharge, but narrowed bilat.  Oropharynx without exudate, palate and uvula are thick and elongated, +side wall narrowing.   Eyes:  Perrla, eomi, no scleral icterus  Neck:  No JVD, no TMG  Cardiovascular:  Normal rate, regular rhythm, no rubs or gallops.  No murmurs        Intact distal pulses  Pulmonary :  Normal breath sounds, no stridor or respiratory distress   No rales, rhonchi,  or wheezing  Abdominal:  Soft, nondistended, bowel sounds present.  No tenderness noted.   Musculoskeletal:  No significant lower extremity edema noted.  Lymph Nodes:  No cervical lymphadenopathy noted  Skin:  No cyanosis noted  Neurologic:  Alert, appropriate, moves all 4 extremities without obvious deficit.         Assessment & Plan:

## 2013-07-31 NOTE — Assessment & Plan Note (Addendum)
The patient has severe obstructive sleep apnea, and has had successful treatment with CPAP. Despite this, it is an aggravation for him, and he is looking for an alternative that he may be able to use short-term on weekends at times. I have had a long discussion with him about sleep apnea, the various treatment options, and what treatments typically work for different severities of sleep apnea.  It is very unlikely that a dental appliance would completely treat his degree of sleep apnea, but it would not be unreasonable to use a dental appliance on occasional weekends to see if it adequately controls his symptoms enough that his quality of life is not overly impacted.  The patient clearly understands this would not be his primary treatment modality, and would continue on CPAP the vast majority of the time. I have also reviewed with him the possibility of upper airway surgery to reduce his AHI, followed by a dental appliance to treat further, but he and I agree this is not a great option at this point unless he loses significant weight.

## 2013-07-31 NOTE — Telephone Encounter (Signed)
Per SN---  Pt will not be able to pick up rx for the hydrocodone until 3-1.  thanks

## 2013-07-31 NOTE — Patient Instructions (Signed)
Continue on cpap the majority of the nights, but is ok with me if you wish to try a dental appliance for occasional weekends. Work on weight loss If you are doing well, followup with me in one year.

## 2013-07-31 NOTE — Telephone Encounter (Signed)
Pt last had norco refilled 07/05/13 #90 x 0 refills. Please advise SN thanks

## 2013-07-31 NOTE — Telephone Encounter (Signed)
Pt aware and wants to pick up refill on 08/05/13. I will foward to leigh to make aware

## 2013-08-05 ENCOUNTER — Telehealth: Payer: Self-pay | Admitting: *Deleted

## 2013-08-05 MED ORDER — HYDROCODONE-ACETAMINOPHEN 5-325 MG PO TABS: 1.0000 | ORAL_TABLET | Freq: Three times a day (TID) | ORAL | Status: DC | PRN

## 2013-08-05 NOTE — Telephone Encounter (Signed)
Pt has appt with Dr. Jonny RuizJohn to set up with new primary care doctor.

## 2013-08-05 NOTE — Telephone Encounter (Signed)
Pt is aware that his rx is ready to be picked up. He would like for us to get him set up with a new PCP. Order will be placed.

## 2013-08-05 NOTE — Telephone Encounter (Signed)
rx will be left up front for the pt to pick up. Please remind the pt that he will need to be set up with new primary care doctor as of April 1.

## 2013-08-19 ENCOUNTER — Telehealth: Payer: Self-pay | Admitting: Pulmonary Disease

## 2013-08-19 NOTE — Telephone Encounter (Signed)
Last OV 04/01/13 Last fill 08/05/13 #90  Has upcoming appointment with Dr. Jonny RuizJohn on 10/04/13.  SN - please advise on refill. Thanks.

## 2013-08-19 NOTE — Telephone Encounter (Signed)
Pt aware.

## 2013-08-19 NOTE — Telephone Encounter (Signed)
Per SN---  Ok to call back on 4-2 and we can print this rx for him at that time.  thanks

## 2013-08-22 ENCOUNTER — Telehealth: Payer: Self-pay | Admitting: Pulmonary Disease

## 2013-08-22 NOTE — Telephone Encounter (Signed)
Pt is wanting to know if [facility] would be able to see him for primary care. [facility]. Pt has appointment coming up with Dr. Jonny RuizJohn on 10/04/13. Nothing further was needed.

## 2013-09-02 ENCOUNTER — Telehealth: Payer: Self-pay | Admitting: Pulmonary Disease

## 2013-09-02 NOTE — Telephone Encounter (Signed)
Pt has pending appt with Dr. Jonny RuizJohn 10/04/13 Pt last had norco refilled 08/05/13 #90 x 0 refills Please advise SN thnaks

## 2013-09-04 ENCOUNTER — Telehealth: Payer: Self-pay | Admitting: Pulmonary Disease

## 2013-09-04 NOTE — Telephone Encounter (Signed)
Duplicate message. 

## 2013-09-04 NOTE — Telephone Encounter (Signed)
Pt calling back

## 2013-09-05 MED ORDER — HYDROCODONE-ACETAMINOPHEN 5-325 MG PO TABS: 1.0000 | ORAL_TABLET | Freq: Three times a day (TID) | ORAL | Status: DC | PRN

## 2013-09-05 NOTE — Telephone Encounter (Signed)
Called and spoke with pt and he is aware of [facility] and [facility] and ready to be picked up.

## 2013-09-05 NOTE — Telephone Encounter (Signed)
rx has been printed out and placed on SN cart to be signed.  Will call pt once this is ready to be picked up.  

## 2013-09-05 NOTE — Telephone Encounter (Signed)
Spoke with patient-he is aware that I am checking with SN and nurse; spoke with Annett GulaLeigh-she is printing Rx and will call patient once Rx is signed.

## 2013-09-28 ENCOUNTER — Other Ambulatory Visit: Payer: Self-pay | Admitting: Pulmonary Disease

## 2013-09-30 ENCOUNTER — Ambulatory Visit: Payer: BC Managed Care – PPO | Admitting: Pulmonary Disease

## 2013-10-01 ENCOUNTER — Ambulatory Visit: Payer: BC Managed Care – PPO | Admitting: Internal Medicine

## 2013-10-04 ENCOUNTER — Encounter: Payer: Self-pay | Admitting: Internal Medicine

## 2013-10-04 ENCOUNTER — Ambulatory Visit (INDEPENDENT_AMBULATORY_CARE_PROVIDER_SITE_OTHER): Payer: BC Managed Care – PPO | Admitting: Internal Medicine

## 2013-10-04 ENCOUNTER — Other Ambulatory Visit (INDEPENDENT_AMBULATORY_CARE_PROVIDER_SITE_OTHER): Payer: BC Managed Care – PPO

## 2013-10-04 VITALS — BP 120/72 | HR 93 | Temp 98.8°F | Ht 72.0 in | Wt 310.0 lb

## 2013-10-04 DIAGNOSIS — R7302 Impaired glucose tolerance (oral): Secondary | ICD-10-CM | POA: Insufficient documentation

## 2013-10-04 DIAGNOSIS — J45909 Unspecified asthma, uncomplicated: Secondary | ICD-10-CM

## 2013-10-04 DIAGNOSIS — N320 Bladder-neck obstruction: Secondary | ICD-10-CM

## 2013-10-04 DIAGNOSIS — G894 Chronic pain syndrome: Secondary | ICD-10-CM

## 2013-10-04 DIAGNOSIS — R7309 Other abnormal glucose: Secondary | ICD-10-CM

## 2013-10-04 DIAGNOSIS — I10 Essential (primary) hypertension: Secondary | ICD-10-CM

## 2013-10-04 DIAGNOSIS — E291 Testicular hypofunction: Secondary | ICD-10-CM

## 2013-10-04 DIAGNOSIS — E785 Hyperlipidemia, unspecified: Secondary | ICD-10-CM

## 2013-10-04 LAB — LIPID PANEL
Cholesterol: 155 mg/dL (ref 0–200)
HDL: 39 mg/dL — ABNORMAL LOW (ref >39.00)
LDL Cholesterol: 76 mg/dL (ref 0–99)
Total CHOL/HDL Ratio: 4
Triglycerides: 202 mg/dL — ABNORMAL HIGH (ref 0.0–149.0)
VLDL: 40.4 mg/dL — AB (ref 0.0–40.0)

## 2013-10-04 LAB — CBC WITH DIFFERENTIAL/PLATELET
BASOS ABS: 0 10*3/uL (ref 0.0–0.1)
Basophils Relative: 0.4 % (ref 0.0–3.0)
Eosinophils Absolute: 0.3 10*3/uL (ref 0.0–0.7)
Eosinophils Relative: 3.4 % (ref 0.0–5.0)
HEMATOCRIT: 46.9 % (ref 39.0–52.0)
Hemoglobin: 15.8 g/dL (ref 13.0–17.0)
LYMPHS ABS: 2.2 10*3/uL (ref 0.7–4.0)
Lymphocytes Relative: 24.1 % (ref 12.0–46.0)
MCHC: 33.8 g/dL (ref 30.0–36.0)
MCV: 87.4 fl (ref 78.0–100.0)
Monocytes Absolute: 0.6 10*3/uL (ref 0.1–1.0)
Monocytes Relative: 6.9 % (ref 3.0–12.0)
Neutro Abs: 6 10*3/uL (ref 1.4–7.7)
Neutrophils Relative %: 65.2 % (ref 43.0–77.0)
PLATELETS: 284 10*3/uL (ref 150.0–400.0)
RBC: 5.37 Mil/uL (ref 4.22–5.81)
RDW: 13.4 % (ref 11.5–14.6)
WBC: 9.3 10*3/uL (ref 4.5–10.5)

## 2013-10-04 LAB — BASIC METABOLIC PANEL
BUN: 19 mg/dL (ref 6–23)
CHLORIDE: 102 meq/L (ref 96–112)
CO2: 27 mEq/L (ref 19–32)
Calcium: 9.9 mg/dL (ref 8.4–10.5)
Creatinine, Ser: 1 mg/dL (ref 0.4–1.5)
GFR: 82.36 mL/min (ref >60.00)
GLUCOSE: 98 mg/dL (ref 70–99)
POTASSIUM: 4.2 meq/L (ref 3.5–5.1)
SODIUM: 137 meq/L (ref 135–145)

## 2013-10-04 LAB — HEPATIC FUNCTION PANEL
ALK PHOS: 64 U/L (ref 39–117)
ALT: 28 U/L (ref 0–53)
AST: 30 U/L (ref 0–37)
Albumin: 4.5 g/dL (ref 3.5–5.2)
Bilirubin, Direct: 0.1 mg/dL (ref 0.0–0.3)
Total Bilirubin: 1 mg/dL (ref 0.3–1.2)
Total Protein: 7.5 g/dL (ref 6.0–8.3)

## 2013-10-04 LAB — TESTOSTERONE: Testosterone: 602.23 ng/dL (ref 350.00–890.00)

## 2013-10-04 LAB — HEMOGLOBIN A1C: Hgb A1c MFr Bld: 5.7 % (ref 4.6–6.5)

## 2013-10-04 LAB — URINALYSIS, ROUTINE W REFLEX MICROSCOPIC
BILIRUBIN URINE: NEGATIVE
Hgb urine dipstick: NEGATIVE
KETONES UR: NEGATIVE
Leukocytes, UA: NEGATIVE
Nitrite: NEGATIVE
Specific Gravity, Urine: >=1.03 — AB (ref 1.000–1.030)
Total Protein, Urine: NEGATIVE
Urine Glucose: NEGATIVE
Urobilinogen, UA: 0.2 (ref 0.0–1.0)
pH: 5.5 (ref 5.0–8.0)

## 2013-10-04 LAB — TSH: TSH: 2.15 u[IU]/mL (ref 0.35–5.50)

## 2013-10-04 LAB — PSA: PSA: 1.11 ng/mL (ref 0.10–4.00)

## 2013-10-04 MED ORDER — HYDROCODONE-ACETAMINOPHEN 5-325 MG PO TABS: 1.0000 | ORAL_TABLET | Freq: Three times a day (TID) | ORAL | Status: DC | PRN

## 2013-10-04 MED ORDER — ALPRAZOLAM 0.5 MG PO TABS: 0.5000 mg | ORAL_TABLET | Freq: Three times a day (TID) | ORAL | Status: DC | PRN

## 2013-10-04 NOTE — Assessment & Plan Note (Signed)
Asympt, for a1c 

## 2013-10-04 NOTE — Patient Instructions (Addendum)
Please continue all other medications as before, and refills have been done if requested - the xanax, and the 3 mo of prescriptions for vicodin  Please remember I will not be able to continue ongoing prescriptions in the future, as I no longer practice treatment of chronic pain  You will be contacted regarding the referral for: pain clinic  Please have the pharmacy call with any other refills you may need.  Please check with your insurance about copay for the shingles shot; if covered, you can make a Nurse Appt to have this done  Please keep your appointments with your specialists as you have planned  Please go to the LAB in the Basement (turn left off the elevator) for the tests to be done today  You will be contacted by phone if any changes need to be made immediately.  Otherwise, you will receive a letter about your results with an explanation, but please check with MyChart first.  Please remember to sign up for MyChart if you have not done so, as this will be important to you in the future with finding out test results, communicating by private email, and scheduling acute appointments online when needed.  Please return in 6 months, or sooner if needed

## 2013-10-04 NOTE — Progress Notes (Signed)
Subjective:    Patient ID: Dakota Ellison, male    DOB: Oct 17, 1950, 63 y.o.   MRN: [id number]  HPI  Here to establish as new pt in transfer from Dr Kriste BasqueNadel; overall doing ok,  Pt denies chest pain, increased sob or doe, wheezing, orthopnea, PND, increased LE swelling, palpitations, dizziness or syncope.  Pt denies polydipsia, polyuria, or low sugar symptoms such as weakness or confusion improved with po intake.  Pt denies new neurological symptoms such as new headache, or facial or extremity weakness or numbness.   Pt states overall good compliance with meds, has been trying to follow lower cholesterol diet, with wt overall stable,  but little exercise however.  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  Chronic pain stable, needs meds refilled, has chronic knee pain which o/w limits mobility and ability to work.  Hard to lose wt. Sees Dr Eusebio FriendlyGollan/card, and Dr Dahlstedt/urology Past Medical History  Diagnosis Date  . OSA (obstructive sleep apnea)   . History of MRSA infection   . Mild asthma   . Hypertension   . Hyperlipemia   . Morbid obesity   . GERD (gastroesophageal reflux disease)   . Rectal fissure   . Testosterone deficiency   . ED (erectile dysfunction)   . Osteoarthritis   . Degenerative disc disease   . Dysthymia    Past Surgical History  Procedure Laterality Date  . Sphincterotomy    . Shoulder arthroscopy      right  . Other surgical history      arthroscopic subacromial decompression  . Other surgical history      open resection, distal right clavicle  . Knee surgery  09/2010    left knee    reports that he quit smoking about 2 years ago. His smoking use included Cigars. He has never used smokeless tobacco. He reports that he drinks alcohol. He reports that he does not use illicit drugs. family history includes Pneumonia in his brother. No Known Allergies Current Outpatient Prescriptions on File Prior to Visit  Medication Sig Dispense Refill  . aspirin 81 MG  tablet Take 81 mg by mouth daily.        . Cinnamon 500 MG capsule Take 2,000 mg by mouth daily.       Marland Kitchen. EXFORGE HCT 10-320-25 MG TABS TAKE 1 TABLET BY MOUTH ONCE A DAY  90 tablet  4  . Garlic 100 MG TABS Take 1 tablet by mouth 2 (two) times daily.      . Ginkgo Biloba 100 MG CAPS Take 60 mg by mouth daily.       . hydrocortisone (PROCTOSOL HC) 2.5 % rectal cream Apply as directed after each BM       . Multiple Vitamin (MULTIVITAMIN) tablet Take 1 tablet by mouth daily.        . niacin 500 MG CR capsule Take 500 mg by mouth 2 (two) times daily.       Marland Kitchen. omeprazole (PRILOSEC) 20 MG capsule TAKE 1 CAPSULE BY MOUTH ONCE A DAY  90 capsule  4  . sertraline (ZOLOFT) 25 MG tablet TAKE 1 TABLET BY MOUTH EVERY DAY  90 tablet  0  . sildenafil (VIAGRA) 100 MG tablet Take as directed  10 tablet  5  . simvastatin (ZOCOR) 40 MG tablet TAKE 1 TABLET BY MOUTH AT BEDTIME  90 tablet  4  . testosterone enanthate (DELATESTRYL) 200 MG/ML injection Inject into the muscle every 14 (fourteen) days. For IM use  only       No current facility-administered medications on file prior to visit.    Review of Systems  Constitutional: Negative for unusual diaphoresis or other sweats  HENT: Negative for ringing in ear Eyes: Negative for double vision or worsening visual disturbance.  Respiratory: Negative for choking and stridor.   Gastrointestinal: Negative for vomiting or other signifcant bowel change Genitourinary: Negative for hematuria or decreased urine volume.  Musculoskeletal: Negative for other MSK pain or swelling Skin: Negative for color change and worsening wound.  Neurological: Negative for tremors and numbness other than noted  Psychiatric/Behavioral: Negative for decreased concentration or agitation other than above       Objective:   Physical Exam BP 120/72  Pulse 93  Temp(Src) 98.8 F (37.1 C) (Oral)  Ht 6' (1.829 m)  Wt 310 lb (140.615 kg)  BMI 42.03 kg/m2  SpO2 95% VS noted,  Constitutional:  Pt appears well-developed, well-nourished.  HENT: Head: NCAT.  Right Ear: External ear normal.  Left Ear: External ear normal.  Eyes: . Pupils are equal, round, and reactive to light. Conjunctivae and EOM are normal Neck: Normal range of motion. Neck supple.  Cardiovascular: Normal rate and regular rhythm.   Pulmonary/Chest: Effort normal and breath sounds normal.  Abd:  Soft, NT, ND, + BS Neurological: Pt is alert. Not confused , motor grossly intact Skin: Skin is warm. No rash Psychiatric: Pt behavior is normal. No agitation.     Assessment & Plan:

## 2013-10-04 NOTE — Progress Notes (Signed)
Pre visit review using our clinic review tool, if applicable. No additional management support is needed unless otherwise documented below in the visit note. 

## 2013-10-04 NOTE — Assessment & Plan Note (Signed)
stable overall by history and exam, recent data reviewed with pt, and pt to continue medical treatment as before,  to f/u any worsening symptoms or concerns SpO2 Readings from Last 3 Encounters:  10/04/13 95%  07/31/13 96%  04/01/13 97%

## 2013-10-04 NOTE — Assessment & Plan Note (Signed)
Also for psa as he is due 

## 2013-10-04 NOTE — Assessment & Plan Note (Signed)
stable overall by history and exam, recent data reviewed with pt, and pt to continue medical treatment as before,  to f/u any worsening symptoms or concerns Lab Results  Component Value Date   LDLCALC 76 10/04/2013

## 2013-10-04 NOTE — Assessment & Plan Note (Addendum)
stable overall by history and exam, recent data reviewed with pt, and pt to continue medical treatment as before,  to f/u any worsening symptoms or concerns BP Readings from Last 3 Encounters:  10/04/13 120/72  07/31/13 112/70  04/01/13 134/80

## 2014-01-09 ENCOUNTER — Other Ambulatory Visit: Payer: Self-pay | Admitting: Internal Medicine

## 2014-01-10 ENCOUNTER — Other Ambulatory Visit: Payer: Self-pay | Admitting: Pulmonary Disease

## 2014-02-18 ENCOUNTER — Other Ambulatory Visit (HOSPITAL_COMMUNITY): Payer: Self-pay | Admitting: Urology

## 2014-02-18 DIAGNOSIS — I739 Peripheral vascular disease, unspecified: Secondary | ICD-10-CM

## 2014-02-27 ENCOUNTER — Ambulatory Visit (HOSPITAL_COMMUNITY)
Admission: RE | Admit: 2014-02-27 | Discharge: 2014-02-27 | Disposition: A | Discharge status: Home / Self Care | Payer: BC Managed Care – PPO | Source: Ambulatory Visit | Attending: Urology | Admitting: Urology

## 2014-02-27 DIAGNOSIS — I70219 Atherosclerosis of native arteries of extremities with intermittent claudication, unspecified extremity: Secondary | ICD-10-CM | POA: Insufficient documentation

## 2014-02-27 DIAGNOSIS — M79609 Pain in unspecified limb: Secondary | ICD-10-CM

## 2014-02-27 DIAGNOSIS — I739 Peripheral vascular disease, unspecified: Secondary | ICD-10-CM

## 2014-02-27 NOTE — Progress Notes (Signed)
VASCULAR LAB PRELIMINARY  ARTERIAL  ABI completed:    RIGHT    LEFT    PRESSURE WAVEFORM  PRESSURE WAVEFORM  BRACHIAL 135 Triphasic BRACHIAL 125 Triphasic  DP 141 Triphasic DP 147 Triphasic  PT 153 Triphasic PT 157 Triphasic  PT post exercise 168 Triphasic PT post exercise 154 Triphasic    RIGHT LEFT  ABI 1.13  / 1.17 1.16 / 1.08   ABIs and Doppler waveforms were normal bilaterally at rest and post exercise of rapid walking for 5 minutes  Arionna Hoggard, RVS 02/27/2014, 6:29 PM

## 2014-03-17 ENCOUNTER — Encounter: Payer: Self-pay | Admitting: Cardiovascular Disease

## 2014-03-17 ENCOUNTER — Ambulatory Visit (INDEPENDENT_AMBULATORY_CARE_PROVIDER_SITE_OTHER): Payer: BC Managed Care – PPO | Admitting: Cardiovascular Disease

## 2014-03-17 VITALS — BP 120/88 | HR 82 | Ht 72.0 in | Wt 316.5 lb

## 2014-03-17 DIAGNOSIS — E785 Hyperlipidemia, unspecified: Secondary | ICD-10-CM

## 2014-03-17 DIAGNOSIS — G4733 Obstructive sleep apnea (adult) (pediatric): Secondary | ICD-10-CM

## 2014-03-17 DIAGNOSIS — M25562 Pain in left knee: Secondary | ICD-10-CM | POA: Insufficient documentation

## 2014-03-17 DIAGNOSIS — I10 Essential (primary) hypertension: Secondary | ICD-10-CM

## 2014-03-17 DIAGNOSIS — M79606 Pain in leg, unspecified: Secondary | ICD-10-CM

## 2014-03-17 DIAGNOSIS — Z9989 Dependence on other enabling machines and devices: Secondary | ICD-10-CM

## 2014-03-17 MED ORDER — AMOXICILLIN-POT CLAVULANATE 875-125 MG PO TABS: 1.0000 | ORAL_TABLET | Freq: Two times a day (BID) | ORAL | Status: DC

## 2014-03-17 NOTE — Assessment & Plan Note (Signed)
Tolerating his CPAP well, no complaints

## 2014-03-17 NOTE — Progress Notes (Signed)
Patient ID: Dakota Ellison, male    DOB: 1951/04/09, 63 y.o.   MRN: [id number]  HPI Comments: [name] is a pleasant 63 year old gentleman with a history of obesity, obstructive sleep apnea, hypertension, hyperlipidemia who presents for routine followup.  Overall he is doing well. He does report having some aching in his leg muscles He was seen by urology who suggested he have a lower extremity ultrasound. By his report, this was normal with no evidence of PAD. Symptoms do not present when he walks, typically at rest. Continues to work part-time, no other complaints Weight continues to be an issue   He does not do a regular exercise program. No significant shortness of breath with exertion. He is able to tolerate his CPAP.   Total cholesterol 128, LDL 59. Tolerating the statin.  Last echocardiogram in March 2008 showing normal systolic function, right ventricle mildly dilated, mild mitral regurg, mild tricuspid regurg. Last stress test in March 2008 where he achieved 10 mets, exercised for 9 minutes, no ischemia noted. Overall a normal,  low-risk scan.   EKG shows normal sinus rhythm with rate 82 beats per minute, no significant ST-T wave changes    Outpatient Encounter Prescriptions as of 03/17/2014  Medication Sig  . ALPRAZolam (XANAX) 0.5 MG tablet Take 1 tablet (0.5 mg total) by mouth 3 (three) times daily as needed for anxiety. For nerves  . aspirin 81 MG tablet Take 81 mg by mouth daily.    . Cinnamon 500 MG capsule Take 2,000 mg by mouth daily.   Marland Kitchen. EXFORGE HCT 10-320-25 MG TABS TAKE 1 TABLET BY MOUTH ONCE A DAY  . Garlic 100 MG TABS Take 1 tablet by mouth 2 (two) times daily.  . Ginkgo Biloba 100 MG CAPS Take 60 mg by mouth daily.   Marland Kitchen. HYDROcodone-acetaminophen (NORCO/VICODIN) 5-325 MG per tablet Take 1 tablet by mouth 3 (three) times daily as needed. To fill December 03, 2013  . hydrocortisone (PROCTOSOL HC) 2.5 % rectal cream Apply as directed after each BM   . Multiple Vitamin  (MULTIVITAMIN) tablet Take 1 tablet by mouth daily.    . niacin 500 MG CR capsule Take 500 mg by mouth 2 (two) times daily.   Marland Kitchen. omeprazole (PRILOSEC) 20 MG capsule TAKE 1 CAPSULE BY MOUTH ONCE A DAY  . sertraline (ZOLOFT) 25 MG tablet TAKE 1 TABLET BY MOUTH EVERY DAY  . sildenafil (VIAGRA) 100 MG tablet Take as directed  . simvastatin (ZOCOR) 40 MG tablet TAKE 1 TABLET BY MOUTH AT BEDTIME  . testosterone enanthate (DELATESTRYL) 200 MG/ML injection Inject into the muscle every 14 (fourteen) days. For IM use only     Review of Systems  Constitutional: Negative.   HENT: Negative.   Eyes: Negative.   Respiratory: Negative.   Cardiovascular: Negative.   Gastrointestinal: Negative.   Endocrine: Negative.   Musculoskeletal: Negative.   Skin: Negative.   Allergic/Immunologic: Negative.   Neurological: Negative.   Hematological: Negative.   Psychiatric/Behavioral: Negative.   All other systems reviewed and are negative.   BP 120/88  Pulse 82  Ht 6' (1.829 m)  Wt 316 lb 8 oz (143.563 kg)  BMI 42.92 kg/m2  Physical Exam  Nursing note and vitals reviewed. Constitutional: He is oriented to person, place, and time. He appears well-developed and well-nourished.  obese  HENT:  Head: Normocephalic.  Nose: Nose normal.  Mouth/Throat: Oropharynx is clear and moist.  Eyes: Conjunctivae are normal. Pupils are equal, round, and reactive to light.  Neck: Normal range of motion. Neck supple. No JVD present.  Cardiovascular: Normal rate, regular rhythm, S1 normal, S2 normal, normal heart sounds and intact distal pulses.  Exam reveals no gallop and no friction rub.   No murmur heard. Pulmonary/Chest: Effort normal and breath sounds normal. No respiratory distress. He has no wheezes. He has no rales. He exhibits no tenderness.  Abdominal: Soft. Bowel sounds are normal. He exhibits no distension. There is no tenderness.  Musculoskeletal: Normal range of motion. He exhibits no edema and no  tenderness.  Lymphadenopathy:    He has no cervical adenopathy.  Neurological: He is alert and oriented to person, place, and time. Coordination normal.  Skin: Skin is warm and dry. No rash noted. No erythema.  Psychiatric: He has a normal mood and affect. His behavior is normal. Judgment and thought content normal.      Assessment and Plan

## 2014-03-17 NOTE — Assessment & Plan Note (Signed)
Previously was doing very well on his cholesterol medication. We will hold the statin given his leg Pain to see if symptoms improve

## 2014-03-17 NOTE — Patient Instructions (Signed)
You are doing well.  Please hold the simvastatin for a few weeks to see if muscle ache gets better Call the office to let us know if pain gets better  Please call us if you have new issues that need to be addressed before your next appt.  Your physician wants you to follow-up in: 12 months.  You will receive a reminder letter in the mail two months in advance. If you don't receive a letter, please call our office to schedule the follow-up appointment.

## 2014-03-17 NOTE — Assessment & Plan Note (Signed)
Possible myalgia from a statin. We have recommended he hold his simvastatin for several weeks and call us if there is improvement. If no improvement, could restart the statin.

## 2014-03-17 NOTE — Assessment & Plan Note (Signed)
Blood pressure is well controlled on today's visit. No changes made to the medications. 

## 2014-03-17 NOTE — Assessment & Plan Note (Signed)
We have encouraged continued exercise, careful diet management in an effort to lose weight. 

## 2014-04-04 ENCOUNTER — Ambulatory Visit: Payer: BC Managed Care – PPO | Admitting: Internal Medicine

## 2014-04-04 ENCOUNTER — Ambulatory Visit: Payer: BC Managed Care – PPO | Admitting: Pulmonary Disease

## 2014-04-07 ENCOUNTER — Other Ambulatory Visit: Payer: Self-pay | Admitting: Pulmonary Disease

## 2014-04-08 ENCOUNTER — Ambulatory Visit (INDEPENDENT_AMBULATORY_CARE_PROVIDER_SITE_OTHER): Payer: BC Managed Care – PPO | Admitting: Internal Medicine

## 2014-04-08 ENCOUNTER — Other Ambulatory Visit: Payer: Self-pay | Admitting: Pulmonary Disease

## 2014-04-08 ENCOUNTER — Encounter: Payer: Self-pay | Admitting: Internal Medicine

## 2014-04-08 ENCOUNTER — Other Ambulatory Visit (INDEPENDENT_AMBULATORY_CARE_PROVIDER_SITE_OTHER): Payer: BC Managed Care – PPO

## 2014-04-08 VITALS — BP 128/80 | HR 87 | Temp 98.7°F | Wt 313.0 lb

## 2014-04-08 DIAGNOSIS — R7302 Impaired glucose tolerance (oral): Secondary | ICD-10-CM

## 2014-04-08 DIAGNOSIS — E785 Hyperlipidemia, unspecified: Secondary | ICD-10-CM

## 2014-04-08 DIAGNOSIS — F411 Generalized anxiety disorder: Secondary | ICD-10-CM

## 2014-04-08 DIAGNOSIS — I10 Essential (primary) hypertension: Secondary | ICD-10-CM

## 2014-04-08 HISTORY — DX: Generalized anxiety disorder: F41.1

## 2014-04-08 LAB — BASIC METABOLIC PANEL
BUN: 18 mg/dL (ref 6–23)
CALCIUM: 9.5 mg/dL (ref 8.4–10.5)
CO2: 27 mEq/L (ref 19–32)
CREATININE: 1 mg/dL (ref 0.4–1.5)
Chloride: 103 mEq/L (ref 96–112)
GFR: 76.77 mL/min (ref >60.00)
Glucose, Bld: 95 mg/dL (ref 70–99)
Potassium: 3.9 mEq/L (ref 3.5–5.1)
Sodium: 137 mEq/L (ref 135–145)

## 2014-04-08 LAB — HEMOGLOBIN A1C: Hgb A1c MFr Bld: 6 % (ref 4.6–6.5)

## 2014-04-08 LAB — HEPATIC FUNCTION PANEL
ALT: 29 U/L (ref 0–53)
AST: 24 U/L (ref 0–37)
Albumin: 3.8 g/dL (ref 3.5–5.2)
Alkaline Phosphatase: 63 U/L (ref 39–117)
BILIRUBIN DIRECT: 0.1 mg/dL (ref 0.0–0.3)
Total Bilirubin: 0.5 mg/dL (ref 0.2–1.2)
Total Protein: 7.7 g/dL (ref 6.0–8.3)

## 2014-04-08 LAB — LIPID PANEL
CHOL/HDL RATIO: 5
Cholesterol: 199 mg/dL (ref 0–200)
HDL: 38.4 mg/dL — ABNORMAL LOW (ref >39.00)
NONHDL: 160.6
TRIGLYCERIDES: 241 mg/dL — AB (ref 0.0–149.0)
VLDL: 48.2 mg/dL — AB (ref 0.0–40.0)

## 2014-04-08 LAB — LDL CHOLESTEROL, DIRECT: LDL DIRECT: 119.7 mg/dL

## 2014-04-08 MED ORDER — TRAMADOL HCL 50 MG PO TABS: 50.0000 mg | ORAL_TABLET | Freq: Three times a day (TID) | ORAL | Status: DC | PRN

## 2014-04-08 MED ORDER — ALPRAZOLAM 0.5 MG PO TABS: 0.5000 mg | ORAL_TABLET | Freq: Three times a day (TID) | ORAL | Status: DC | PRN

## 2014-04-08 NOTE — Progress Notes (Signed)
   Subjective:    Patient ID: Dakota Ellison, male    DOB: [birth year], 63 y.o.   MRN: [id number]  HPI  Here to f/u; overall doing ok,  Pt denies chest pain, increased sob or doe, wheezing, orthopnea, PND, increased LE swelling, palpitations, dizziness or syncope.  Pt denies polydipsia, polyuria, or low sugar symptoms such as weakness or confusion improved with po intake.  Pt denies new neurological symptoms such as new headache, or facial or extremity weakness or numbness.   Pt states overall good compliance with meds, has been trying to follow lower cholesterol diet, with wt overall stable,  but little exercise however.  Was having significant pain to LE's, saw card who suggested zocor holiday, pain resolved after 3 wks. Would like chol level done today.      Review of Systems     Objective:   Physical Exam        Assessment & Plan:

## 2014-04-08 NOTE — Progress Notes (Signed)
Pre visit review using our clinic review tool, if applicable. No additional management support is needed unless otherwise documented below in the visit note. 

## 2014-04-08 NOTE — Patient Instructions (Signed)
OK to stop the hydrocodone  Please take all new medication as prescribed - the tramadol as needed for pain  Please continue all other medications as before, and refills have been done if requested - the xanax  Please have the pharmacy call with any other refills you may need.  Please continue your efforts at being more active, low cholesterol diet, and weight control.  Please keep your appointments with your specialists as you may have planned  Please go to the LAB in the Basement (turn left off the elevator) for the tests to be done today  You will be contacted by phone if any changes need to be made immediately.  Otherwise, you will receive a letter about your results with an explanation, but please check with MyChart first.  Please remember to sign up for MyChart if you have not done so, as this will be important to you in the future with finding out test results, communicating by private email, and scheduling acute appointments online when needed.  Please return in 6 months, or sooner if needed

## 2014-04-08 NOTE — Progress Notes (Signed)
Subjective:    Patient ID: Dakota MannGary W Halvorsen, male    DOB: 12/18/1950, 63 y.o.   MRN: [id number]  HPI  Here to f/u; overall doing ok,  Pt denies chest pain, increased sob or doe, wheezing, orthopnea, PND, increased LE swelling, palpitations, dizziness or syncope.  Pt denies polydipsia, polyuria, or low sugar symptoms such as weakness or confusion improved with po intake.  Pt denies new neurological symptoms such as new headache, or facial or extremity weakness or numbness.   Pt states overall good compliance with meds, has been trying to follow lower cholesterol, diabetic diet, with wt overall stable,  but little exercise however, although just in the past 2 wks has been able to increase his activity somewhat after stopping zocor and leg muscle pain seems improved.  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  Denies worsening depressive symptoms, suicidal ideation, or panic; has ongoing anxiety, not increased recently. Past Medical History  Diagnosis Date  . OSA (obstructive sleep apnea)   . History of MRSA infection   . Mild asthma   . Hypertension   . Hyperlipemia   . Morbid obesity   . GERD (gastroesophageal reflux disease)   . Rectal fissure   . Testosterone deficiency   . ED (erectile dysfunction)   . Osteoarthritis   . Degenerative disc disease   . Dysthymia   . Anxiety state 04/08/2014   Past Surgical History  Procedure Laterality Date  . Sphincterotomy    . Shoulder arthroscopy      right  . Other surgical history      arthroscopic subacromial decompression  . Other surgical history      open resection, distal right clavicle  . Knee surgery  09/2010    left knee    reports that he has never smoked. He has never used smokeless tobacco. He reports that he drinks alcohol. He reports that he does not use illicit drugs. family history includes Pneumonia in his brother. Allergies  Allergen Reactions  . Zocor [Simvastatin] Other (See Comments)    Current Outpatient Prescriptions on File Prior to Visit  Medication Sig Dispense Refill  . ALPRAZolam (XANAX) 0.5 MG tablet Take 1 tablet (0.5 mg total) by mouth 3 (three) times daily as needed for anxiety. For nerves 90 tablet 5  . aspirin 81 MG tablet Take 81 mg by mouth daily.      . Cinnamon 500 MG capsule Take 2,000 mg by mouth daily.     Marland Kitchen. EXFORGE HCT 10-320-25 MG TABS TAKE 1 TABLET BY MOUTH ONCE A DAY 90 tablet 4  . Garlic 100 MG TABS Take 1 tablet by mouth 2 (two) times daily.    . Ginkgo Biloba 100 MG CAPS Take 60 mg by mouth daily.     Marland Kitchen. HYDROcodone-acetaminophen (NORCO/VICODIN) 5-325 MG per tablet Take 1 tablet by mouth 3 (three) times daily as needed. To fill December 03, 2013 90 tablet 0  . hydrocortisone (PROCTOSOL HC) 2.5 % rectal cream Apply as directed after each BM     . Multiple Vitamin (MULTIVITAMIN) tablet Take 1 tablet by mouth daily.      . niacin 500 MG CR capsule Take 500 mg by mouth 2 (two) times daily.     Marland Kitchen. omeprazole (PRILOSEC) 20 MG capsule TAKE 1 CAPSULE BY MOUTH ONCE A DAY 90 capsule 4  . sertraline (ZOLOFT) 25 MG tablet TAKE 1 TABLET BY MOUTH EVERY DAY 90 tablet 2  .  sildenafil (VIAGRA) 100 MG tablet Take as directed 10 tablet 5  . testosterone enanthate (DELATESTRYL) 200 MG/ML injection Inject into the muscle every 14 (fourteen) days. For IM use only     No current facility-administered medications on file prior to visit.   Review of Systems  Constitutional: Negative for unusual diaphoresis or other sweats  HENT: Negative for ringing in ear Eyes: Negative for double vision or worsening visual disturbance.  Respiratory: Negative for choking and stridor.   Gastrointestinal: Negative for vomiting or other signifcant bowel change Genitourinary: Negative for hematuria or decreased urine volume.  Musculoskeletal: Negative for other MSK pain or swelling Skin: Negative for color change and worsening wound.  Neurological: Negative for tremors and numbness other  than noted  Psychiatric/Behavioral: Negative for decreased concentration or agitation other than above       Objective:   Physical Exam BP 128/80 mmHg  Pulse 87  Temp(Src) 98.7 F (37.1 C) (Oral)  Wt 313 lb (141.976 kg)  SpO2 96% VS noted,  Constitutional: Pt appears well-developed, well-nourished.  HENT: Head: NCAT.  Right Ear: External ear normal.  Left Ear: External ear normal.  Eyes: . Pupils are equal, round, and reactive to light. Conjunctivae and EOM are normal Neck: Normal range of motion. Neck supple.  Cardiovascular: Normal rate and regular rhythm.   Pulmonary/Chest: Effort normal and breath sounds normal.  Abd:  Soft, NT, ND, + BS Neurological: Pt is alert. Not confused , motor grossly intact Skin: Skin is warm. No rash Psychiatric: Pt behavior is normal. No agitation. 1+ nervous Bilat knees with crepitus    Assessment & Plan:  Impair glucose tol - for a1c, cont diet and wt loss efforts  Hyperlipidemia - off zocor, for lipid check, consider lipitor for LDL > 70  HTN , essential - stable overall by history and exam, recent data reviewed with pt, and pt to continue medical treatment as before,  to f/u any worsening symptoms or concerns BP Readings from Last 3 Encounters:  04/08/14 128/80  03/17/14 120/88  10/04/13 120/72   Knee pain c/w DJD - for tramadol prn,  to f/u any worsening symptoms or concerns  Anxiety - for xanax refill, o/w stable

## 2014-04-09 ENCOUNTER — Other Ambulatory Visit: Payer: Self-pay | Admitting: Internal Medicine

## 2014-04-09 ENCOUNTER — Encounter: Payer: Self-pay | Admitting: Internal Medicine

## 2014-04-09 MED ORDER — ATORVASTATIN CALCIUM 10 MG PO TABS: 10.0000 mg | ORAL_TABLET | Freq: Every day | ORAL | Status: DC

## 2014-04-10 ENCOUNTER — Other Ambulatory Visit: Payer: Self-pay | Admitting: Internal Medicine

## 2014-04-10 ENCOUNTER — Telehealth: Payer: Self-pay | Admitting: Cardiovascular Disease

## 2014-04-10 NOTE — Telephone Encounter (Signed)
Pt was told to call back once he saw his PCP doctor: Dr Mariah Millinggollan told pt to Stop taking seminstat, told him to get PCP to do blood work on cholesterol, PCP said its elevated since he stopped that medication, thus doctor put patient on Atolvastatin 10 mg once daily. Pt just picked this med up, he is just letting us know if we had any questions please call pt.

## 2014-04-11 ENCOUNTER — Telehealth: Payer: Self-pay

## 2014-04-11 NOTE — Telephone Encounter (Signed)
Spoke w/ pt.  Advised him that the message I received yesterday stated that he was calling to let us know about his med change.  He reports that his cholesterol was slightly elevated since stopping simvastatin, so his PCP started him on atorvastatin 10 mg daily. Reports that his legs feel better since stopping the simvastatin.  Asked him to call back if he develops any sx or has any questions.

## 2014-04-11 NOTE — Telephone Encounter (Signed)
Pt called back stating he called yesterday regarding his medications, and has not heard back . Please call.

## 2014-07-31 ENCOUNTER — Ambulatory Visit (INDEPENDENT_AMBULATORY_CARE_PROVIDER_SITE_OTHER): Payer: BLUE CROSS/BLUE SHIELD | Admitting: Pulmonary Disease

## 2014-07-31 ENCOUNTER — Encounter: Payer: Self-pay | Admitting: Pulmonary Disease

## 2014-07-31 VITALS — BP 148/64 | HR 78 | Temp 97.8°F | Ht 72.0 in | Wt 313.4 lb

## 2014-07-31 DIAGNOSIS — G4733 Obstructive sleep apnea (adult) (pediatric): Secondary | ICD-10-CM

## 2014-07-31 DIAGNOSIS — Z9989 Dependence on other enabling machines and devices: Principal | ICD-10-CM

## 2014-07-31 NOTE — Assessment & Plan Note (Signed)
The patient continues to do very well with his C Pap device, and his download shows great compliance and good control of his apnea. I have asked him to continue wearing his device, and to keep up with mask changes and supplies, and to work aggressively on weight loss. See him back in one year if doing well.

## 2014-07-31 NOTE — Patient Instructions (Signed)
Stay on cpap, and keep up with mask changes and supplies. Work on weight loss followup with me again in one year 

## 2014-07-31 NOTE — Progress Notes (Signed)
   Subjective:    Patient ID: Chauncey MannGary W Doo, male    DOB: 05/03/1951, 64 y.o.   MRN: [id number]  HPI The patient comes in today for follow-up of his obstructive sleep apnea. He is wearing C Pap compliantly by his download, and is having no issues with his mask fit or pressure. He is sleeping well with his device, and has excellent daytime alertness. Of note, his weight is up since last visit.   Review of Systems  Constitutional: Negative for fever and unexpected weight change.  HENT: Negative for congestion, dental problem, ear pain, nosebleeds, postnasal drip, rhinorrhea, sinus pressure, sneezing, sore throat and trouble swallowing.   Eyes: Negative for redness and itching.  Respiratory: Negative for cough, chest tightness, shortness of breath and wheezing.   Cardiovascular: Negative for palpitations and leg swelling.  Gastrointestinal: Negative for nausea and vomiting.  Genitourinary: Negative for dysuria.  Musculoskeletal: Negative for joint swelling.  Skin: Negative for rash.  Neurological: Negative for headaches.  Hematological: Does not bruise/bleed easily.  Psychiatric/Behavioral: Negative for dysphoric mood. The patient is not nervous/anxious.        Objective:   Physical Exam Obese male in no acute distress Nose without purulence or discharge noted Neck without lymphadenopathy or thyromegaly No skin breakdown or pressure necrosis from the C Pap mask Lower extremities without significant edema, no cyanosis Alert and oriented, moves all 4 extremities.       Assessment & Plan:

## 2014-10-01 ENCOUNTER — Other Ambulatory Visit: Payer: Self-pay | Admitting: Internal Medicine

## 2014-10-07 ENCOUNTER — Ambulatory Visit (INDEPENDENT_AMBULATORY_CARE_PROVIDER_SITE_OTHER): Payer: BLUE CROSS/BLUE SHIELD | Admitting: Internal Medicine

## 2014-10-07 ENCOUNTER — Encounter: Payer: Self-pay | Admitting: Internal Medicine

## 2014-10-07 ENCOUNTER — Other Ambulatory Visit (INDEPENDENT_AMBULATORY_CARE_PROVIDER_SITE_OTHER): Payer: BLUE CROSS/BLUE SHIELD

## 2014-10-07 VITALS — BP 124/78 | HR 73 | Temp 98.6°F | Resp 20 | Ht 72.0 in | Wt 309.2 lb

## 2014-10-07 DIAGNOSIS — I10 Essential (primary) hypertension: Secondary | ICD-10-CM

## 2014-10-07 DIAGNOSIS — R7302 Impaired glucose tolerance (oral): Secondary | ICD-10-CM | POA: Diagnosis not present

## 2014-10-07 DIAGNOSIS — M25562 Pain in left knee: Secondary | ICD-10-CM | POA: Diagnosis not present

## 2014-10-07 DIAGNOSIS — Z0000 Encounter for general adult medical examination without abnormal findings: Secondary | ICD-10-CM | POA: Insufficient documentation

## 2014-10-07 DIAGNOSIS — F411 Generalized anxiety disorder: Secondary | ICD-10-CM

## 2014-10-07 DIAGNOSIS — Z0189 Encounter for other specified special examinations: Secondary | ICD-10-CM

## 2014-10-07 DIAGNOSIS — F39 Unspecified mood [affective] disorder: Secondary | ICD-10-CM | POA: Insufficient documentation

## 2014-10-07 LAB — CBC WITH DIFFERENTIAL/PLATELET
BASOS PCT: 0.5 % (ref 0.0–3.0)
Basophils Absolute: 0.1 10*3/uL (ref 0.0–0.1)
EOS PCT: 1.2 % (ref 0.0–5.0)
Eosinophils Absolute: 0.2 10*3/uL (ref 0.0–0.7)
HEMATOCRIT: 51.7 % (ref 39.0–52.0)
Hemoglobin: 17.6 g/dL — ABNORMAL HIGH (ref 13.0–17.0)
LYMPHS ABS: 2.1 10*3/uL (ref 0.7–4.0)
Lymphocytes Relative: 15.1 % (ref 12.0–46.0)
MCHC: 34.1 g/dL (ref 30.0–36.0)
MCV: 85.6 fl (ref 78.0–100.0)
MONO ABS: 0.9 10*3/uL (ref 0.1–1.0)
Monocytes Relative: 6.3 % (ref 3.0–12.0)
NEUTROS ABS: 10.5 10*3/uL — AB (ref 1.4–7.7)
Neutrophils Relative %: 76.9 % (ref 43.0–77.0)
Platelets: 259 10*3/uL (ref 150.0–400.0)
RBC: 6.04 Mil/uL — AB (ref 4.22–5.81)
RDW: 13.9 % (ref 11.5–15.5)
WBC: 13.7 10*3/uL — ABNORMAL HIGH (ref 4.0–10.5)

## 2014-10-07 LAB — URINALYSIS, ROUTINE W REFLEX MICROSCOPIC
Bilirubin Urine: NEGATIVE
Hgb urine dipstick: NEGATIVE
KETONES UR: NEGATIVE
LEUKOCYTES UA: NEGATIVE
NITRITE: NEGATIVE
PH: 5.5 (ref 5.0–8.0)
RBC / HPF: NONE SEEN (ref 0–?)
Specific Gravity, Urine: 1.025 (ref 1.000–1.030)
TOTAL PROTEIN, URINE-UPE24: NEGATIVE
Urine Glucose: NEGATIVE
Urobilinogen, UA: 0.2 (ref 0.0–1.0)

## 2014-10-07 LAB — BASIC METABOLIC PANEL
BUN: 16 mg/dL (ref 6–23)
CHLORIDE: 101 meq/L (ref 96–112)
CO2: 30 meq/L (ref 19–32)
CREATININE: 1.12 mg/dL (ref 0.40–1.50)
Calcium: 9.7 mg/dL (ref 8.4–10.5)
GFR: 70.37 mL/min (ref >60.00)
Glucose, Bld: 104 mg/dL — ABNORMAL HIGH (ref 70–99)
Potassium: 4.3 mEq/L (ref 3.5–5.1)
SODIUM: 139 meq/L (ref 135–145)

## 2014-10-07 LAB — HEPATIC FUNCTION PANEL
ALK PHOS: 79 U/L (ref 39–117)
ALT: 27 U/L (ref 0–53)
AST: 18 U/L (ref 0–37)
Albumin: 4.4 g/dL (ref 3.5–5.2)
BILIRUBIN TOTAL: 0.8 mg/dL (ref 0.2–1.2)
Bilirubin, Direct: 0.2 mg/dL (ref 0.0–0.3)
TOTAL PROTEIN: 7.5 g/dL (ref 6.0–8.3)

## 2014-10-07 LAB — LIPID PANEL
CHOLESTEROL: 155 mg/dL (ref 0–200)
HDL: 40.6 mg/dL (ref >39.00)
LDL Cholesterol: 79 mg/dL (ref 0–99)
NonHDL: 114.4
TRIGLYCERIDES: 177 mg/dL — AB (ref 0.0–149.0)
Total CHOL/HDL Ratio: 4
VLDL: 35.4 mg/dL (ref 0.0–40.0)

## 2014-10-07 LAB — TSH: TSH: 1.92 u[IU]/mL (ref 0.35–4.50)

## 2014-10-07 LAB — HEMOGLOBIN A1C: HEMOGLOBIN A1C: 6 % (ref 4.6–6.5)

## 2014-10-07 LAB — PSA: PSA: 1.16 ng/mL (ref 0.10–4.00)

## 2014-10-07 MED ORDER — TRAMADOL HCL 50 MG PO TABS: 50.0000 mg | ORAL_TABLET | Freq: Three times a day (TID) | ORAL | Status: DC | PRN

## 2014-10-07 MED ORDER — ALPRAZOLAM 0.5 MG PO TABS: 0.5000 mg | ORAL_TABLET | Freq: Three times a day (TID) | ORAL | Status: DC | PRN

## 2014-10-07 NOTE — Assessment & Plan Note (Signed)
Chornic, OA related, wil evenatully need TKR, for pain control,  to f/u any worsening symptoms or concerns, f/u ortho Dr Zachery DauerBarnes prn

## 2014-10-07 NOTE — Progress Notes (Signed)
Subjective:    Patient ID: Dakota Ellison, male    DOB: February 07, 1951, 64 y.o.   MRN: [id number]  HPI   Here for wellness and f/u;  Overall doing ok;  Pt denies Chest pain, worsening SOB, DOE, wheezing, orthopnea, PND, worsening LE edema, palpitations, dizziness or syncope.  Pt denies neurological change such as new headache, facial or extremity weakness.  Pt denies polydipsia, polyuria, or low sugar symptoms. Pt states overall good compliance with treatment and medications, good tolerability, and has been trying to follow appropriate diet.  Pt denies worsening depressive symptoms, suicidal ideation or panic. No fever, night sweats, wt loss, loss of appetite, or other constitutional symptoms.  Pt states good ability with ADL's, has low fall risk, home safety reviewed and adequate, no other significant changes in hearing or vision, and only occasionally active with exercise.  No hx HTN, takes meds for cardiac   No leg pain with lipitor. Chronic pain stable.  Past Medical History  Diagnosis Date  . OSA (obstructive sleep apnea)   . History of MRSA infection   . Mild asthma   . Hypertension   . Hyperlipemia   . Morbid obesity   . GERD (gastroesophageal reflux disease)   . Rectal fissure   . Testosterone deficiency   . ED (erectile dysfunction)   . Osteoarthritis   . Degenerative disc disease   . Dysthymia   . Anxiety state 04/08/2014   Past Surgical History  Procedure Laterality Date  . Sphincterotomy    . Shoulder arthroscopy      right  . Other surgical history      arthroscopic subacromial decompression  . Other surgical history      open resection, distal right clavicle  . Knee surgery  09/2010    left knee    reports that he has never smoked. He has never used smokeless tobacco. He reports that he drinks alcohol. He reports that he does not use illicit drugs. family history includes Pneumonia in his brother. Allergies  Allergen Reactions  . Zocor [Simvastatin] Other (See Comments)    Current Outpatient Prescriptions on File Prior to Visit  Medication Sig Dispense Refill  . Amlodipine-Valsartan-HCTZ 10-320-25 MG TABS TAKE 1 TABLET BY MOUTH ONCE A DAY 90 tablet 3  . aspirin 81 MG tablet Take 81 mg by mouth daily.      Marland Kitchen. atorvastatin (LIPITOR) 10 MG tablet Take 1 tablet (10 mg total) by mouth daily. 90 tablet 3  . Cinnamon 500 MG capsule Take 2,000 mg by mouth daily.     . Garlic 100 MG TABS Take 1 tablet by mouth 2 (two) times daily.    . Ginkgo Biloba 100 MG CAPS Take 60 mg by mouth daily.     . hydrocortisone (PROCTOSOL HC) 2.5 % rectal cream Apply as directed after each BM     . Multiple Vitamin (MULTIVITAMIN) tablet Take 1 tablet by mouth daily.      . niacin 500 MG CR capsule Take 500 mg by mouth 2 (two) times daily.     Marland Kitchen. omeprazole (PRILOSEC) 20 MG capsule TAKE 1 CAPSULE BY MOUTH ONCE A DAY 90 capsule 3  . sertraline (ZOLOFT) 25 MG tablet TAKE 1 TABLET BY MOUTH EVERY DAY 90 tablet 0  . sildenafil (VIAGRA) 100 MG tablet Take as directed 10 tablet 5  . testosterone enanthate (DELATESTRYL) 200 MG/ML injection Inject into the muscle every 14 (fourteen) days. For IM use only     No current  facility-administered medications on file prior to visit.    Review of Systems Constitutional: Negative for increased diaphoresis, other activity, appetite or siginficant weight change other than noted HENT: Negative for worsening hearing loss, ear pain, facial swelling, mouth sores and neck stiffness.   Eyes: Negative for other worsening pain, redness or visual disturbance.  Respiratory: Negative for shortness of breath and wheezing  Cardiovascular: Negative for chest pain and palpitations.  Gastrointestinal: Negative for diarrhea, blood in stool, abdominal distention or other pain Genitourinary: Negative for hematuria, flank pain or change in urine volume.  Musculoskeletal: Negative for myalgias or other joint complaints.  Skin: Negative for color change and wound or drainage.   Neurological: Negative for syncope and numbness. other than noted Hematological: Negative for adenopathy. or other swelling Psychiatric/Behavioral: Negative for hallucinations, SI, self-injury, decreased concentration or other worsening agitation.      Objective:   Physical Exam BP 124/78 mmHg  Pulse 73  Temp(Src) 98.6 F (37 C) (Oral)  Resp 20  Ht 6' (1.829 m)  Wt 309 lb 2.6 oz (140.234 kg)  BMI 41.92 kg/m2  SpO2 93% VS noted,  Constitutional: Pt is oriented to person, place, and time. Appears well-developed and well-nourished, in no significant distress Head: Normocephalic and atraumatic.  Right Ear: External ear normal.  Left Ear: External ear normal.  Nose: Nose normal.  Mouth/Throat: Oropharynx is clear and moist.  Eyes: Conjunctivae and EOM are normal. Pupils are equal, round, and reactive to light.  Neck: Normal range of motion. Neck supple. No JVD present. No tracheal deviation present or significant neck LA or mass Cardiovascular: Normal rate, regular rhythm, normal heart sounds and intact distal pulses.   Pulmonary/Chest: Effort normal and breath sounds without rales or wheezing  Abdominal: Soft. Bowel sounds are normal. NT. No HSM  Musculoskeletal: Normal range of motion. Exhibits no edema.  Lymphadenopathy:  Has no cervical adenopathy.  Neurological: Pt is alert and oriented to person, place, and time. Pt has normal reflexes. No cranial nerve deficit. Motor grossly intact Skin: Skin is warm and dry. No rash noted.  Psychiatric:  Has normal mood and affect. Behavior is normal.      Assessment & Plan:

## 2014-10-07 NOTE — Assessment & Plan Note (Signed)
Lab Results  Component Value Date   HGBA1C 6.0 04/08/2014   stable overall by history and exam, recent data reviewed with pt, and pt to continue medical treatment as before,  to f/u any worsening symptoms or concerns

## 2014-10-07 NOTE — Assessment & Plan Note (Signed)
stable overall by history and exam, recent data reviewed with pt, and pt to continue medical treatment as before,  to f/u any worsening symptoms or concerns BP Readings from Last 3 Encounters:  10/07/14 124/78  07/31/14 148/64  04/08/14 128/80

## 2014-10-07 NOTE — Assessment & Plan Note (Signed)

## 2014-10-07 NOTE — Patient Instructions (Signed)

## 2014-10-07 NOTE — Assessment & Plan Note (Signed)
stable overall by history and exam, recent data reviewed with pt, and pt to continue medical treatment as before,  to f/u any worsening symptoms or concerns Lab Results  Component Value Date   WBC 9.3 10/04/2013   HGB 15.8 10/04/2013   HCT 46.9 10/04/2013   PLT 284.0 10/04/2013   GLUCOSE 95 04/08/2014   CHOL 199 04/08/2014   TRIG 241.0* 04/08/2014   HDL 38.40* 04/08/2014   LDLDIRECT 119.7 04/08/2014   LDLCALC 76 10/04/2013   ALT 29 04/08/2014   AST 24 04/08/2014   NA 137 04/08/2014   K 3.9 04/08/2014   CL 103 04/08/2014   CREATININE 1.0 04/08/2014   BUN 18 04/08/2014   CO2 27 04/08/2014   TSH 2.15 10/04/2013   PSA 1.11 10/04/2013   HGBA1C 6.0 04/08/2014

## 2014-10-30 ENCOUNTER — Telehealth: Payer: Self-pay | Admitting: Cardiovascular Disease

## 2014-10-30 NOTE — Telephone Encounter (Signed)
Left message for pt that I am unsure as to what he needs.  If he needs ins paperwork completed, he will need to obtain the forms and bring them by.  Asked him to call back to clarify.

## 2014-10-30 NOTE — Telephone Encounter (Signed)
[facility]  [facility]  Insurance says he needs  Pre determination of benefits from doctor with medical necessity.  Patient can come pick up when ready.  Leave msg if no answer.

## 2014-10-30 NOTE — Telephone Encounter (Signed)
Spoke w/ pt.  He states that his surgeon told him to wait until his surgery is scheduled before requesting cardiac clearance.  He will call back if/when we can be of further assistance.

## 2014-12-27 ENCOUNTER — Other Ambulatory Visit: Payer: Self-pay | Admitting: Internal Medicine

## 2014-12-29 ENCOUNTER — Other Ambulatory Visit: Payer: Self-pay

## 2014-12-29 MED ORDER — SERTRALINE HCL 25 MG PO TABS: 25.0000 mg | ORAL_TABLET | Freq: Every day | ORAL | Status: DC

## 2015-03-16 ENCOUNTER — Encounter: Payer: Self-pay | Admitting: Cardiovascular Disease

## 2015-03-16 ENCOUNTER — Ambulatory Visit (INDEPENDENT_AMBULATORY_CARE_PROVIDER_SITE_OTHER): Payer: BLUE CROSS/BLUE SHIELD | Admitting: Cardiovascular Disease

## 2015-03-16 VITALS — BP 110/70 | HR 80 | Ht 72.0 in | Wt 311.0 lb

## 2015-03-16 DIAGNOSIS — E785 Hyperlipidemia, unspecified: Secondary | ICD-10-CM

## 2015-03-16 DIAGNOSIS — R7302 Impaired glucose tolerance (oral): Secondary | ICD-10-CM

## 2015-03-16 DIAGNOSIS — G4733 Obstructive sleep apnea (adult) (pediatric): Secondary | ICD-10-CM | POA: Diagnosis not present

## 2015-03-16 DIAGNOSIS — I10 Essential (primary) hypertension: Secondary | ICD-10-CM | POA: Diagnosis not present

## 2015-03-16 DIAGNOSIS — Z9989 Dependence on other enabling machines and devices: Secondary | ICD-10-CM

## 2015-03-16 NOTE — Assessment & Plan Note (Signed)
Cholesterol is at goal on the current lipid regimen. No changes to the medications were made.  

## 2015-03-16 NOTE — Patient Instructions (Signed)
You are doing well. No medication changes were made.  Please call us if you have new issues that need to be addressed before your next appt.  Your physician wants you to follow-up in: 12 months.  You will receive a reminder letter in the mail two months in advance. If you don't receive a letter, please call our office to schedule the follow-up appointment. 

## 2015-03-16 NOTE — Assessment & Plan Note (Signed)
We have encouraged continued exercise, careful diet management in an effort to lose weight. 

## 2015-03-16 NOTE — Assessment & Plan Note (Signed)
Eats well, no dietary restrictions Weight stable Recommended diet change, more exercise

## 2015-03-16 NOTE — Assessment & Plan Note (Signed)
Blood pressure is well controlled on today's visit. No changes made to the medications. 

## 2015-03-16 NOTE — Progress Notes (Signed)
Patient ID: Dakota Ellison, male    DOB: 01/22/1951, 64 y.o.   MRN: [id number]  HPI Comments: Dakota Ellison is a pleasant 64 year old gentleman with a history of obesity, obstructive sleep apnea, hypertension, hyperlipidemia who presents for routine followup of his hypertension  Overall he is doing well.  Simvastatin held on his prior clinic visit, started on Lipitor. 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 No complaints apart from his weight   He does not do a regular exercise program. No significant shortness of breath with exertion. He is able to tolerate his CPAP.  Lab work reviewed with him showing hemoglobin A1c 6.0, total cholesterol 155, LDL 79  EKG on today's visit shows normal sinus rhythm with rate 80 bpm, no significant ST or T-wave changes  Other past medical history previous lower extremity ultrasound. By his report, this was normal with no evidence of PAD.  Last echocardiogram in March 2008 showing normal systolic function, right ventricle mildly dilated, mild mitral regurg, mild tricuspid regurg. Last stress test in March 2008 where he achieved 10 mets, exercised for 9 minutes, no ischemia noted. Overall a normal,  low-risk scan.    Allergies  Allergen Reactions  . Zocor [Simvastatin] Other (See Comments)    Current Outpatient Prescriptions on File Prior to Visit  Medication Sig Dispense Refill  . ALPRAZolam (XANAX) 0.5 MG tablet Take 1 tablet (0.5 mg total) by mouth 3 (three) times daily as needed for anxiety. For nerves 90 tablet 5  . Amlodipine-Valsartan-HCTZ 10-320-25 MG TABS TAKE 1 TABLET BY MOUTH ONCE A DAY 90 tablet 3  . aspirin 81 MG tablet Take 81 mg by mouth daily.      Marland Kitchen atorvastatin (LIPITOR) 10 MG tablet Take 1 tablet (10 mg total) by mouth daily. 90 tablet 3  . Cinnamon 500 MG capsule Take 2,000 mg by mouth daily.     . Garlic 100 MG TABS Take 1 tablet by mouth 2 (two) times daily.    . Ginkgo Biloba 100 MG CAPS Take 60 mg by  mouth daily.     . hydrocortisone (PROCTOSOL HC) 2.5 % rectal cream Apply as directed after each BM     . Multiple Vitamin (MULTIVITAMIN) tablet Take 1 tablet by mouth daily.      . niacin 500 MG CR capsule Take 500 mg by mouth 2 (two) times daily.     Marland Kitchen omeprazole (PRILOSEC) 20 MG capsule TAKE 1 CAPSULE BY MOUTH ONCE A DAY 90 capsule 3  . sertraline (ZOLOFT) 25 MG tablet Take 1 tablet (25 mg total) by mouth daily. 90 tablet 0  . sildenafil (VIAGRA) 100 MG tablet Take as directed 10 tablet 5  . testosterone enanthate (DELATESTRYL) 200 MG/ML injection Inject into the muscle every 14 (fourteen) days. For IM use only    . traMADol (ULTRAM) 50 MG tablet Take 1 tablet (50 mg total) by mouth every 8 (eight) hours as needed. 90 tablet 5   No current facility-administered medications on file prior to visit.    Past Medical History  Diagnosis Date  . OSA (obstructive sleep apnea)   . History of MRSA infection   . Mild asthma   . Hypertension   . Hyperlipemia   . Morbid obesity (HCC)   . GERD (gastroesophageal reflux disease)   . Rectal fissure   . Testosterone deficiency   . ED (erectile dysfunction)   . Osteoarthritis   . Degenerative disc disease   . Dysthymia   .  Anxiety state 04/08/2014    Past Surgical History  Procedure Laterality Date  . Sphincterotomy    . Shoulder arthroscopy      right  . Other surgical history      arthroscopic subacromial decompression  . Other surgical history      open resection, distal right clavicle  . Knee surgery  09/2010    left knee    Social History  reports that he has never smoked. He has never used smokeless tobacco. He reports that he does not drink alcohol or use illicit drugs.  Family History family history includes Pneumonia in his brother.   Review of Systems  Constitutional: Negative.   HENT: Negative.   Eyes: Negative.   Respiratory: Negative.   Cardiovascular: Negative.   Gastrointestinal: Negative.   Endocrine: Negative.    Musculoskeletal: Negative.   Skin: Negative.   Allergic/Immunologic: Negative.   Neurological: Negative.   Hematological: Negative.   Psychiatric/Behavioral: Negative.   All other systems reviewed and are negative.   BP 110/70 mmHg  Pulse 80  Ht 6' (1.829 m)  Wt 311 lb (141.069 kg)  BMI 42.17 kg/m2  Physical Exam  Constitutional: He is oriented to person, place, and time. He appears well-developed and well-nourished.  obese  HENT:  Head: Normocephalic.  Nose: Nose normal.  Mouth/Throat: Oropharynx is clear and moist.  Eyes: Conjunctivae are normal. Pupils are equal, round, and reactive to light.  Neck: Normal range of motion. Neck supple. No JVD present.  Cardiovascular: Normal rate, regular rhythm, S1 normal, S2 normal, normal heart sounds and intact distal pulses.  Exam reveals no gallop and no friction rub.   No murmur heard. Pulmonary/Chest: Effort normal and breath sounds normal. No respiratory distress. He has no wheezes. He has no rales. He exhibits no tenderness.  Abdominal: Soft. Bowel sounds are normal. He exhibits no distension. There is no tenderness.  Musculoskeletal: Normal range of motion. He exhibits no edema or tenderness.  Lymphadenopathy:    He has no cervical adenopathy.  Neurological: He is alert and oriented to person, place, and time. Coordination normal.  Skin: Skin is warm and dry. No rash noted. No erythema.  Psychiatric: He has a normal mood and affect. His behavior is normal. Judgment and thought content normal.      Assessment and Plan   Nursing note and vitals reviewed.

## 2015-03-16 NOTE — Assessment & Plan Note (Signed)
Tolerating his CPAP. We will discuss periodic follow-up with Board certified  sleep physician on his next visit

## 2015-03-27 ENCOUNTER — Other Ambulatory Visit: Payer: Self-pay | Admitting: Internal Medicine

## 2015-03-28 ENCOUNTER — Other Ambulatory Visit: Payer: Self-pay | Admitting: Internal Medicine

## 2015-04-07 ENCOUNTER — Encounter: Payer: Self-pay | Admitting: Internal Medicine

## 2015-04-07 ENCOUNTER — Ambulatory Visit (INDEPENDENT_AMBULATORY_CARE_PROVIDER_SITE_OTHER): Payer: BLUE CROSS/BLUE SHIELD | Admitting: Internal Medicine

## 2015-04-07 ENCOUNTER — Other Ambulatory Visit (INDEPENDENT_AMBULATORY_CARE_PROVIDER_SITE_OTHER): Payer: BLUE CROSS/BLUE SHIELD

## 2015-04-07 ENCOUNTER — Ambulatory Visit (INDEPENDENT_AMBULATORY_CARE_PROVIDER_SITE_OTHER)
Admission: RE | Admit: 2015-04-07 | Discharge: 2015-04-07 | Disposition: A | Discharge status: Home / Self Care | Payer: BLUE CROSS/BLUE SHIELD | Source: Ambulatory Visit | Attending: Internal Medicine | Admitting: Internal Medicine

## 2015-04-07 VITALS — BP 118/82 | HR 95 | Temp 98.0°F | Ht 72.0 in | Wt 311.0 lb

## 2015-04-07 DIAGNOSIS — M545 Low back pain: Secondary | ICD-10-CM | POA: Diagnosis not present

## 2015-04-07 DIAGNOSIS — I10 Essential (primary) hypertension: Secondary | ICD-10-CM | POA: Diagnosis not present

## 2015-04-07 DIAGNOSIS — Z20828 Contact with and (suspected) exposure to other viral communicable diseases: Secondary | ICD-10-CM | POA: Diagnosis not present

## 2015-04-07 DIAGNOSIS — E785 Hyperlipidemia, unspecified: Secondary | ICD-10-CM

## 2015-04-07 DIAGNOSIS — R7302 Impaired glucose tolerance (oral): Secondary | ICD-10-CM

## 2015-04-07 DIAGNOSIS — Z0000 Encounter for general adult medical examination without abnormal findings: Secondary | ICD-10-CM

## 2015-04-07 DIAGNOSIS — Z0189 Encounter for other specified special examinations: Secondary | ICD-10-CM

## 2015-04-07 LAB — BASIC METABOLIC PANEL
BUN: 18 mg/dL (ref 6–23)
CHLORIDE: 100 meq/L (ref 96–112)
CO2: 30 meq/L (ref 19–32)
CREATININE: 1.13 mg/dL (ref 0.40–1.50)
Calcium: 9.9 mg/dL (ref 8.4–10.5)
GFR: 69.54 mL/min (ref >60.00)
Glucose, Bld: 112 mg/dL — ABNORMAL HIGH (ref 70–99)
POTASSIUM: 4.2 meq/L (ref 3.5–5.1)
Sodium: 138 mEq/L (ref 135–145)

## 2015-04-07 LAB — LIPID PANEL
CHOL/HDL RATIO: 4
CHOLESTEROL: 137 mg/dL (ref 0–200)
HDL: 36.9 mg/dL — ABNORMAL LOW (ref >39.00)
NONHDL: 100.55
TRIGLYCERIDES: 209 mg/dL — AB (ref 0.0–149.0)
VLDL: 41.8 mg/dL — ABNORMAL HIGH (ref 0.0–40.0)

## 2015-04-07 LAB — HEPATIC FUNCTION PANEL
ALT: 27 U/L (ref 0–53)
AST: 25 U/L (ref 0–37)
Albumin: 4.3 g/dL (ref 3.5–5.2)
Alkaline Phosphatase: 81 U/L (ref 39–117)
BILIRUBIN DIRECT: 0.1 mg/dL (ref 0.0–0.3)
BILIRUBIN TOTAL: 0.8 mg/dL (ref 0.2–1.2)
TOTAL PROTEIN: 7.6 g/dL (ref 6.0–8.3)

## 2015-04-07 LAB — LDL CHOLESTEROL, DIRECT: Direct LDL: 72 mg/dL

## 2015-04-07 LAB — HEMOGLOBIN A1C: HEMOGLOBIN A1C: 6 % (ref 4.6–6.5)

## 2015-04-07 LAB — HEPATITIS C ANTIBODY: HCV AB: NEGATIVE

## 2015-04-07 MED ORDER — TRAMADOL HCL 50 MG PO TABS: 50.0000 mg | ORAL_TABLET | Freq: Three times a day (TID) | ORAL | Status: DC | PRN

## 2015-04-07 MED ORDER — ALPRAZOLAM 0.5 MG PO TABS: 0.5000 mg | ORAL_TABLET | Freq: Three times a day (TID) | ORAL | Status: DC | PRN

## 2015-04-07 NOTE — Progress Notes (Signed)
Pre visit review using our clinic review tool, if applicable. No additional management support is needed unless otherwise documented below in the visit note. 

## 2015-04-07 NOTE — Progress Notes (Signed)
Subjective:    Patient ID: Dakota Ellison, male    DOB: [birth year], 64 y.o.   MRN: [id number]  HPI   Here to f/u; overall doing ok,  Pt denies chest pain, increasing sob or doe, wheezing, orthopnea, PND, increased LE swelling, palpitations, dizziness or syncope.  Pt denies new neurological symptoms such as new headache, or facial or extremity weakness or numbness.  Pt denies polydipsia, polyuria, or low sugar episode.   Pt denies new neurological symptoms such as new headache, or facial or extremity weakness or numbness.   Pt states overall good compliance with meds, mostly trying to follow appropriate diet, with wt overall stable,  but little exercise however. Did have a fall with stumble at a curb with sitdown hard on the ground.  ALso has a small area of canker sores to upper inner right lip, without ulcer, swelling or drainage.  Does have several wks ongoing nasal allergy symptoms with clearish congestion, itch and sneezing, without fever, pain, ST, cough, swelling or wheezing.  Seen per cardiology, doing well per pt. Past Medical History  Diagnosis Date  . OSA (obstructive sleep apnea)   . History of MRSA infection   . Mild asthma   . Hypertension   . Hyperlipemia   . Morbid obesity (HCC)   . GERD (gastroesophageal reflux disease)   . Rectal fissure   . Testosterone deficiency   . ED (erectile dysfunction)   . Osteoarthritis   . Degenerative disc disease   . Dysthymia   . Anxiety state 04/08/2014   Past Surgical History  Procedure Laterality Date  . Sphincterotomy    . Shoulder arthroscopy      right  . Other surgical history      arthroscopic subacromial decompression  . Other surgical history      open resection, distal right clavicle  . Knee surgery  09/2010    left knee    reports that he has never smoked. He has never used smokeless tobacco. He reports that he does not drink alcohol or use illicit drugs. family history includes Pneumonia in his brother. Allergies    Allergen Reactions  . Zocor [Simvastatin] Other (See Comments)   Current Outpatient Prescriptions on File Prior to Visit  Medication Sig Dispense Refill  . Amlodipine-Valsartan-HCTZ 10-320-25 MG TABS TAKE 1 TABLET BY MOUTH EVERY DAY 90 tablet 1  . aspirin 81 MG tablet Take 81 mg by mouth daily.      Marland Kitchen atorvastatin (LIPITOR) 10 MG tablet TAKE 1 TABLET (10 MG TOTAL) BY MOUTH DAILY. 90 tablet 1  . Cinnamon 500 MG capsule Take 2,000 mg by mouth daily.     . Garlic 100 MG TABS Take 1 tablet by mouth 2 (two) times daily.    . Ginkgo Biloba 100 MG CAPS Take 60 mg by mouth daily.     . hydrocortisone (PROCTOSOL HC) 2.5 % rectal cream Apply as directed after each BM     . Multiple Vitamin (MULTIVITAMIN) tablet Take 1 tablet by mouth daily.      . niacin 500 MG CR capsule Take 500 mg by mouth 2 (two) times daily.     Marland Kitchen omeprazole (PRILOSEC) 20 MG capsule TAKE ONE CAPSULE BY MOUTH EVERY DAY 90 capsule 1  . sertraline (ZOLOFT) 25 MG tablet TAKE 1 TABLET (25 MG TOTAL) BY MOUTH DAILY. 90 tablet 2  . sildenafil (VIAGRA) 100 MG tablet Take as directed 10 tablet 5  . testosterone enanthate (DELATESTRYL) 200 MG/ML injection Inject  into the muscle every 14 (fourteen) days. For IM use only     No current facility-administered medications on file prior to visit.     Review of Systems  Constitutional: Negative for unusual diaphoresis or night sweats HENT: Negative for ringing in ear or discharge Eyes: Negative for double vision or worsening visual disturbance.  Respiratory: Negative for choking and stridor.   Gastrointestinal: Negative for vomiting or other signifcant bowel change Genitourinary: Negative for hematuria or change in urine volume.  Musculoskeletal: Negative for other MSK pain or swelling Skin: Negative for color change and worsening wound.  Neurological: Negative for tremors and numbness other than noted  Psychiatric/Behavioral: Negative for decreased concentration or agitation other than  above       Objective:   Physical Exam BP 118/82 mmHg  Pulse 95  Temp(Src) 98 F (36.7 C) (Oral)  Ht 6' (1.829 m)  Wt 311 lb (141.069 kg)  BMI 42.17 kg/m2  SpO2 95% VS noted, not ill appaering Constitutional: Pt appears in no significant distress HENT: Head: NCAT.  Right Ear: External ear normal.  Left Ear: External ear normal.  Bilat tm's with mild erythema.  Max sinus areas mild tender.  Pharynx with non erythema, no exudate Eyes: . Pupils are equal, round, and reactive to light. Conjunctivae and EOM are normal Neck: Normal range of motion. Neck supple.  Cardiovascular: Normal rate and regular rhythm.   Pulmonary/Chest: Effort normal and breath sounds without rales or wheezing.  Abd:  Soft, NT, ND, + BS Spine nontender Neurological: Pt is alert. Not confused , motor grossly intact Skin: Skin is warm. No rash, no LE edema Psychiatric: Pt behavior is normal. No agitation.     Assessment & Plan:

## 2015-04-07 NOTE — Patient Instructions (Addendum)
OK to try zyrtec otc for allergies  OK for abreva otc for the mouth sore  Please continue all other medications as before, and refills have been done if requested.  Please have the pharmacy call with any other refills you may need.  Please continue your efforts at being more active, low cholesterol diet, and weight control.  You are otherwise up to date with prevention measures today.  Please keep your appointments with your specialists as you may have planned  Please go to the XRAY Department in the Basement (go straight as you get off the elevator) for the x-ray testing  Please go to the LAB in the Basement (turn left off the elevator) for the tests to be done today  Please remember to sign up for MyChart if you have not done so, as this will be important to you in the future with finding out test results, communicating by private email, and scheduling acute appointments online when needed.  Please return in 6 months, or sooner if needed, with Lab testing done 3-5 days before

## 2015-04-11 DIAGNOSIS — M545 Low back pain, unspecified: Secondary | ICD-10-CM | POA: Insufficient documentation

## 2015-04-11 NOTE — Assessment & Plan Note (Signed)
stable overall by history and exam, recent data reviewed with pt, and pt to continue medical treatment as before,  to f/u any worsening symptoms or concerns Lab Results  Component Value Date   LDLCALC 79 10/07/2014

## 2015-04-11 NOTE — Assessment & Plan Note (Signed)
With fall, ok for film r/o fx, to f/u any worsening symptoms or concerns

## 2015-04-11 NOTE — Assessment & Plan Note (Signed)
stable overall by history and exam, recent data reviewed with pt, and pt to continue medical treatment as before,  to f/u any worsening symptoms or concerns Lab Results  Component Value Date   HGBA1C 6.0 04/07/2015

## 2015-04-11 NOTE — Assessment & Plan Note (Signed)
stable overall by history and exam, recent data reviewed with pt, and pt to continue medical treatment as before,  to f/u any worsening symptoms or concerns BP Readings from Last 3 Encounters:  04/07/15 118/82  03/16/15 110/70  10/07/14 124/78

## 2015-04-13 ENCOUNTER — Telehealth: Payer: Self-pay | Admitting: Internal Medicine

## 2015-04-13 DIAGNOSIS — M545 Low back pain, unspecified: Secondary | ICD-10-CM

## 2015-04-13 NOTE — Telephone Encounter (Signed)
Please call patient regarding imaging results. I did go over the letter Dr. Jonny RuizJohn sent but he would still like a phone call

## 2015-04-14 NOTE — Telephone Encounter (Signed)
Pt is requesting a referral to Dr Channing Muttersoy Neurosurgeon. He states that his back is still extremely painful

## 2015-04-14 NOTE — Telephone Encounter (Signed)
Referral done

## 2015-04-15 NOTE — Telephone Encounter (Signed)
Pt informed

## 2015-04-22 NOTE — Telephone Encounter (Signed)
Pt call back stated that Dr. Channing Muttersoy will not be back until after Thanksgiving, they advise him to go a head and get Dr. Jonny RuizJohn to order a CT for his lower back (because that's what Dr. Channing Muttersoy will do when he first see his pt). Please call pt  Phone # [phone]

## 2015-04-22 NOTE — Telephone Encounter (Signed)
I do not normally order CT for the lower back  I can try to order the MRI for the back but this is likely to be denied by insurance based on his last exam, as I did not note any new neurological changes

## 2015-04-23 NOTE — Telephone Encounter (Signed)
Pt advised and understood. 

## 2015-04-29 ENCOUNTER — Telehealth: Payer: Self-pay | Admitting: Internal Medicine

## 2015-04-29 NOTE — Telephone Encounter (Signed)
Patient is calling to follow up on MRI referral. He states that he has not heard anything, but is in pain.

## 2015-05-07 NOTE — Telephone Encounter (Signed)
Pt scheduled  

## 2015-05-14 ENCOUNTER — Ambulatory Visit
Admission: RE | Admit: 2015-05-14 | Discharge: 2015-05-14 | Disposition: A | Discharge status: Home / Self Care | Payer: BLUE CROSS/BLUE SHIELD | Source: Ambulatory Visit | Attending: Internal Medicine | Admitting: Internal Medicine

## 2015-05-14 DIAGNOSIS — M545 Low back pain, unspecified: Secondary | ICD-10-CM

## 2015-05-15 ENCOUNTER — Encounter: Payer: Self-pay | Admitting: Internal Medicine

## 2015-07-27 ENCOUNTER — Encounter: Payer: Self-pay | Admitting: Internal Medicine

## 2015-07-27 ENCOUNTER — Ambulatory Visit (INDEPENDENT_AMBULATORY_CARE_PROVIDER_SITE_OTHER): Payer: BLUE CROSS/BLUE SHIELD | Admitting: Internal Medicine

## 2015-07-27 VITALS — BP 134/80 | HR 85 | Ht 72.0 in | Wt 314.0 lb

## 2015-07-27 DIAGNOSIS — Z9989 Dependence on other enabling machines and devices: Principal | ICD-10-CM

## 2015-07-27 DIAGNOSIS — G4733 Obstructive sleep apnea (adult) (pediatric): Secondary | ICD-10-CM

## 2015-07-27 NOTE — Patient Instructions (Signed)
We can continue CPAP 14/ [facility]  Please call if there are problems

## 2015-07-27 NOTE — Progress Notes (Signed)
   Subjective:    Patient ID: Dakota Ellison, male    DOB: [birth year], 65 y.o.   MRN: [id number]  HPI 07/31/14- Dr Shelle Iron The patient comes in today for follow-up of his obstructive sleep apnea. He is wearing C Pap compliantly by his download, and is having no issues with his mask fit or pressure. He is sleeping well with his device, and has excellent daytime alertness. Of note, his weight is up since last visit.  07/27/2015-66 year old male followed for OSA complicated by morbid obesity, HBP, asthma, GERD NPSG 2010, AHI 82/ hr CPAP to 20 CPAP 14/ [facility] Former patient of [facility]; Currently on CPAP machine through [facility] CPAP download confirms great compliance and control. He feels quality of life is improved with CPAP. No changes needed  Review of Systems  Constitutional: Negative for fever and unexpected weight change.  HENT: Negative for congestion, dental problem, ear pain, nosebleeds, postnasal drip, rhinorrhea, sinus pressure, sneezing, sore throat and trouble swallowing.   Eyes: Negative for redness and itching.  Respiratory: Negative for cough, chest tightness, shortness of breath and wheezing.   Cardiovascular: Negative for palpitations and leg swelling.  Gastrointestinal: Negative for nausea and vomiting.  Genitourinary: Negative for dysuria.  Musculoskeletal: Negative for joint swelling.  Skin: Negative for rash.  Neurological: Negative for headaches.  Hematological: Does not bruise/bleed easily.  Psychiatric/Behavioral: Negative for dysphoric mood. The patient is not nervous/anxious.      Objective:   OBJ- Physical Exam General- Alert, Oriented, Affect-appropriate, Distress- none acute, + obese/big man Skin- rash-none, lesions- none, excoriation- none Lymphadenopathy- none Head- atraumatic            Eyes- Gross vision intact, PERRLA, conjunctivae and secretions clear            Ears- Hearing, canals-normal            Nose- Clear, no-Septal dev, mucus, polyps, erosion,  perforation             Throat- Mallampati III , mucosa clear , drainage- none, tonsils- atrophic Neck- flexible , trachea midline, no stridor , thyroid nl, carotid no bruit Chest - symmetrical excursion , unlabored           Heart/CV- RRR , no murmur , no gallop  , no rub, nl s1 s2                           - JVD- none , edema- none, stasis changes- none, varices- none           Lung- clear to P&A, wheeze- none, cough- none , dullness-none, rub- none           Chest wall-  Abd-  Br/ Gen/ Rectal- Not done, not indicated Extrem- cyanosis- none, clubbing, none, atrophy- none, strength- nl Neuro- grossly intact to observation    Assessment & Plan:

## 2015-07-29 ENCOUNTER — Telehealth: Payer: Self-pay | Admitting: Internal Medicine

## 2015-07-29 NOTE — Telephone Encounter (Signed)
Called and spoke to pt. Pt states his CPAP machine is not turning on like it use to, he is having to adjust the SD card to help turn the machine on. Advised pt to contact [facility] and they will be able to assess and fix the machine if needed. Gave pt the [facility] number to call. Pt verbalized understanding and denied any further questions or concerns at this time.

## 2015-07-31 NOTE — Assessment & Plan Note (Signed)
He has not made lifestyle changes necessary to accomplish significant weight loss. He is a big framed man

## 2015-07-31 NOTE — Assessment & Plan Note (Signed)
Download confirms good compliance and control. He says he feels better using CPAP. Plan-no changes required

## 2015-08-03 ENCOUNTER — Ambulatory Visit: Payer: BLUE CROSS/BLUE SHIELD | Admitting: Pulmonary Disease

## 2015-08-03 ENCOUNTER — Telehealth: Payer: Self-pay | Admitting: Internal Medicine

## 2015-08-03 NOTE — Telephone Encounter (Signed)
Patient would like to transfer from Dr.John to Dr.Duncan.  Patient said he lives closer to the [facility] office and he has a friend that sees Dr.Duncan and is very happy with him.  Dr.Duncan's first available for a new patient is in September.  Patient would continue seeing Dr.John until he can get in with Dr.Duncan.  Please advise.

## 2015-08-03 NOTE — Telephone Encounter (Signed)
Okay with me if okay with Dr. Jonny Ruiz, with the plan to make the switch in the fall.  Thanks.   Routed to Dr. Jonny Ruiz also for input.

## 2015-08-07 NOTE — Telephone Encounter (Signed)
Ok with me 

## 2015-08-10 NOTE — Telephone Encounter (Signed)
I spoke to patient and he scheduled appointment with Dr.Duncan on 04/05/16.

## 2015-08-10 NOTE — Telephone Encounter (Signed)
Noted. Thanks.

## 2015-08-18 ENCOUNTER — Encounter: Payer: Self-pay | Admitting: Internal Medicine

## 2015-09-09 ENCOUNTER — Telehealth: Payer: Self-pay | Admitting: Pulmonary Disease

## 2015-09-09 NOTE — Telephone Encounter (Signed)
Called and spoke with pt. He states that he saw SN as his PCP since the 1980's but was turned over to Dr. Melvyn NovasJohns. He has not been seen by SN since 04/01/13. He states that he is unhappy with Dr. Melvyn NovasJohns and is requesting to see SN as his PCP. I explained to him that I could not schedule him without sending a message to SN for approval. He voiced understanding and had no further questions.   SN please advise

## 2015-09-10 NOTE — Telephone Encounter (Signed)
LMTC x 1  

## 2015-09-10 NOTE — Telephone Encounter (Signed)
Per SN:  Yes, will be glad to see him back. Thanks.

## 2015-09-11 NOTE — Telephone Encounter (Signed)
[phone], pt cb

## 2015-09-11 NOTE — Telephone Encounter (Signed)
Called and spoke to pt. Informed him of the recs per SN. Appt made with SN on 5/4. Pt verbalized understanding and denied any further questions or concerns at this time.

## 2015-10-06 ENCOUNTER — Other Ambulatory Visit: Payer: Self-pay | Admitting: Internal Medicine

## 2015-10-06 ENCOUNTER — Ambulatory Visit: Payer: BLUE CROSS/BLUE SHIELD | Admitting: Internal Medicine

## 2015-10-06 NOTE — Telephone Encounter (Signed)
Done hardcopy to Corinne  

## 2015-10-06 NOTE — Telephone Encounter (Signed)
Please advise 

## 2015-10-07 NOTE — Telephone Encounter (Signed)
Medication sent to pharmacy  

## 2015-10-08 ENCOUNTER — Ambulatory Visit (INDEPENDENT_AMBULATORY_CARE_PROVIDER_SITE_OTHER)
Admission: RE | Admit: 2015-10-08 | Discharge: 2015-10-08 | Disposition: A | Discharge status: Home / Self Care | Payer: BLUE CROSS/BLUE SHIELD | Source: Ambulatory Visit | Attending: Pulmonary Disease | Admitting: Pulmonary Disease

## 2015-10-08 ENCOUNTER — Encounter: Payer: Self-pay | Admitting: Internal Medicine

## 2015-10-08 ENCOUNTER — Encounter: Payer: Self-pay | Admitting: Pulmonary Disease

## 2015-10-08 ENCOUNTER — Ambulatory Visit (INDEPENDENT_AMBULATORY_CARE_PROVIDER_SITE_OTHER): Payer: BLUE CROSS/BLUE SHIELD | Admitting: Pulmonary Disease

## 2015-10-08 ENCOUNTER — Other Ambulatory Visit (INDEPENDENT_AMBULATORY_CARE_PROVIDER_SITE_OTHER): Payer: BLUE CROSS/BLUE SHIELD

## 2015-10-08 VITALS — BP 130/78 | HR 68 | Temp 98.4°F | Ht 72.0 in | Wt 305.0 lb

## 2015-10-08 DIAGNOSIS — Z9989 Dependence on other enabling machines and devices: Principal | ICD-10-CM

## 2015-10-08 DIAGNOSIS — E785 Hyperlipidemia, unspecified: Secondary | ICD-10-CM

## 2015-10-08 DIAGNOSIS — M150 Primary generalized (osteo)arthritis: Secondary | ICD-10-CM

## 2015-10-08 DIAGNOSIS — I10 Essential (primary) hypertension: Secondary | ICD-10-CM

## 2015-10-08 DIAGNOSIS — Z0000 Encounter for general adult medical examination without abnormal findings: Secondary | ICD-10-CM

## 2015-10-08 DIAGNOSIS — R7302 Impaired glucose tolerance (oral): Secondary | ICD-10-CM | POA: Diagnosis not present

## 2015-10-08 DIAGNOSIS — G4733 Obstructive sleep apnea (adult) (pediatric): Secondary | ICD-10-CM

## 2015-10-08 DIAGNOSIS — Z0189 Encounter for other specified special examinations: Secondary | ICD-10-CM | POA: Diagnosis not present

## 2015-10-08 DIAGNOSIS — Z23 Encounter for immunization: Secondary | ICD-10-CM

## 2015-10-08 DIAGNOSIS — M545 Low back pain, unspecified: Secondary | ICD-10-CM

## 2015-10-08 DIAGNOSIS — F411 Generalized anxiety disorder: Secondary | ICD-10-CM

## 2015-10-08 DIAGNOSIS — E349 Endocrine disorder, unspecified: Secondary | ICD-10-CM

## 2015-10-08 DIAGNOSIS — M159 Polyosteoarthritis, unspecified: Secondary | ICD-10-CM

## 2015-10-08 DIAGNOSIS — K219 Gastro-esophageal reflux disease without esophagitis: Secondary | ICD-10-CM

## 2015-10-08 DIAGNOSIS — E291 Testicular hypofunction: Secondary | ICD-10-CM

## 2015-10-08 DIAGNOSIS — F341 Dysthymic disorder: Secondary | ICD-10-CM

## 2015-10-08 LAB — HEPATIC FUNCTION PANEL
ALT: 29 U/L (ref 0–53)
AST: 25 U/L (ref 0–37)
Albumin: 4.6 g/dL (ref 3.5–5.2)
Alkaline Phosphatase: 76 U/L (ref 39–117)
BILIRUBIN DIRECT: 0.1 mg/dL (ref 0.0–0.3)
BILIRUBIN TOTAL: 0.7 mg/dL (ref 0.2–1.2)
Total Protein: 7 g/dL (ref 6.0–8.3)

## 2015-10-08 LAB — CBC WITH DIFFERENTIAL/PLATELET
BASOS PCT: 0.4 % (ref 0.0–3.0)
Basophils Absolute: 0 10*3/uL (ref 0.0–0.1)
EOS ABS: 0.2 10*3/uL (ref 0.0–0.7)
EOS PCT: 2.1 % (ref 0.0–5.0)
HEMATOCRIT: 49.2 % (ref 39.0–52.0)
HEMOGLOBIN: 17 g/dL (ref 13.0–17.0)
LYMPHS PCT: 16 % (ref 12.0–46.0)
Lymphs Abs: 1.8 10*3/uL (ref 0.7–4.0)
MCHC: 34.5 g/dL (ref 30.0–36.0)
MCV: 85.6 fl (ref 78.0–100.0)
MONOS PCT: 8.5 % (ref 3.0–12.0)
Monocytes Absolute: 0.9 10*3/uL (ref 0.1–1.0)
Neutro Abs: 8 10*3/uL — ABNORMAL HIGH (ref 1.4–7.7)
Neutrophils Relative %: 73 % (ref 43.0–77.0)
Platelets: 269 10*3/uL (ref 150.0–400.0)
RBC: 5.75 Mil/uL (ref 4.22–5.81)
RDW: 14 % (ref 11.5–15.5)
WBC: 11 10*3/uL — AB (ref 4.0–10.5)

## 2015-10-08 LAB — COMPREHENSIVE METABOLIC PANEL
ALT: 29 U/L (ref 0–53)
AST: 25 U/L (ref 0–37)
Albumin: 4.6 g/dL (ref 3.5–5.2)
Alkaline Phosphatase: 76 U/L (ref 39–117)
BILIRUBIN TOTAL: 0.7 mg/dL (ref 0.2–1.2)
BUN: 20 mg/dL (ref 6–23)
CO2: 31 meq/L (ref 19–32)
Calcium: 9.8 mg/dL (ref 8.4–10.5)
Chloride: 101 mEq/L (ref 96–112)
Creatinine, Ser: 1 mg/dL (ref 0.40–1.50)
GFR: 79.94 mL/min (ref >60.00)
GLUCOSE: 95 mg/dL (ref 70–99)
POTASSIUM: 4.4 meq/L (ref 3.5–5.1)
Sodium: 140 mEq/L (ref 135–145)
Total Protein: 7 g/dL (ref 6.0–8.3)

## 2015-10-08 LAB — BASIC METABOLIC PANEL
BUN: 20 mg/dL (ref 6–23)
CHLORIDE: 101 meq/L (ref 96–112)
CO2: 31 meq/L (ref 19–32)
CREATININE: 1 mg/dL (ref 0.40–1.50)
Calcium: 9.8 mg/dL (ref 8.4–10.5)
GFR: 79.94 mL/min (ref >60.00)
Glucose, Bld: 95 mg/dL (ref 70–99)
POTASSIUM: 4.4 meq/L (ref 3.5–5.1)
Sodium: 140 mEq/L (ref 135–145)

## 2015-10-08 LAB — LIPID PANEL
CHOL/HDL RATIO: 3
Cholesterol: 138 mg/dL (ref 0–200)
HDL: 39.4 mg/dL (ref >39.00)
LDL Cholesterol: 61 mg/dL (ref 0–99)
NONHDL: 98.16
Triglycerides: 187 mg/dL — ABNORMAL HIGH (ref 0.0–149.0)
VLDL: 37.4 mg/dL (ref 0.0–40.0)

## 2015-10-08 LAB — URINALYSIS, ROUTINE W REFLEX MICROSCOPIC
Bilirubin Urine: NEGATIVE
Ketones, ur: NEGATIVE
NITRITE: NEGATIVE
Specific Gravity, Urine: 1.025 (ref 1.000–1.030)
Total Protein, Urine: NEGATIVE
URINE GLUCOSE: NEGATIVE
Urobilinogen, UA: 0.2 (ref 0.0–1.0)
pH: 5.5 (ref 5.0–8.0)

## 2015-10-08 LAB — TSH: TSH: 1.59 u[IU]/mL (ref 0.35–4.50)

## 2015-10-08 LAB — HEMOGLOBIN A1C: Hgb A1c MFr Bld: 6.2 % (ref 4.6–6.5)

## 2015-10-08 LAB — PSA: PSA: 1.1 ng/mL (ref 0.10–4.00)

## 2015-10-08 NOTE — Patient Instructions (Signed)
Today we updated your med list in our EPIC system...    Continue your current medications the same...  Today we did a follow up CXR & your fastingh blood work...    We will contact you w/ the results when available...   We gave you the 1st of 2 pneumonia shots>> we gave you the PREVNAR-13 shot today...    You will need the Pneumovax-23 shot in about 2094yr...  We also wrote for the Shingles vaccine- you can get this at [facility] or [facility]...  Call for any questions...  Let's plan a follow up visit in 43mo, sooner if needed for problems.Marland Kitchen..Marland Kitchen

## 2015-10-08 NOTE — Progress Notes (Signed)
Subjective:    Patient ID: Dakota Ellison, male    DOB: [birth year], 65 y.o.   MRN: [id number]  HPI 65 y/o WM here for an add-on visit... he has multiple medical problems as noted below...   ~  SEE PREV EPIC NOTES FOR OLDER DATA >>    LABS 4/13:  FLP- at goals on Simva40;  Chems- ok w/ BS=106;  CBC- wnl;  TSH=2.20;  PSA=1.44  LABS 10/13:  Chems- ok w/ BS106...   ~  October 01, 2012:  477mo ROV & Dakota Ellison has an appt coming up w/ Dakota Ellison, [facility] to see about his inguinal hernias R>L; he is also interested in discussing poss Bariatric Surg;  He notes that his weight is about the same but he has been unable to lose;  Resting well w/ CPAP from [facility] Ellison- Dakota Ellison, BP remains controlled on hisExforgeHCT, Chol has been good on the Simva40, etc...     We reviewed prob list, meds, xrays and labs> see below for updates >>   LABS 4/14:  FLP- ok x TG=167 on Simva40 & needs better low fat diet & wt reduction;  Chems- wnl w/ BS=94 & A1c=5.9;  CBC- wnl;  TSH=2.39;  PSA=1.30...  ~  January 29, 2013:  31mo ROV & acute visit request add-on for puritic rash> states it started several weeks ago, red sl excoriated rash on legs/ feet/ ankles (including betw the toes), groin, one on penis, buttock, back, chest; very pruritic- esp at night & discrete macules/ papules are all c/w scabies...  Discussed w/ pt & rec treatment w/ KWELL lotion (wash, dry, apply for 12h & wash off, repeat process in 1wk if not completely resolved; also wrote for Atarax25mg  prn itching....    He saw Dakota Ellison 5/14> he felt the right inguinal hernia but could not find a recurrent  left sided hernia on exam (prev [facility] Ellison repair 1998); they did CT Pelvis- showed RIH containing fat & prev LIH repair w/o recurrence, min calcif atherosclerotic changes noted...     Dakota Ellison saw [facility] Ellison 6/14 for colonoscopy> 2 sm sigmoid polyps removed, int hems, & we don't have path reports; pt told to f/u in 5-71yrs...    He had a f/u visit w/ Dakota Ellison 6/14> hypogonadism on testos shots  200mg /ml- getting 1ml every other week & Viagra prn; they checked UA, CBC, Prolactin, Testos level & PSA- we do not have their lab data... We reviewed prob list, meds, xrays and labs> see below for updates >>   ~  April 01, 2013:  77mo ROV & Dakota Ellison requests Rx for new CPAP machine- his is broken- but studies & prev Rx per Dakota Ellison and we discussed this    OSA> he had an AHI=82 in 2010 & started on CPAP20 by Dakota Ellison, Dakota Ellison & he continues follow up there (we do not have notes)...    Hx mild asthma> he still smokes an occas cigar & asked to quit completely!!! He states no attacks in yrs & breathing is OK, not on meds- denies CP, palpit, SOB, edema...    HBP> on ExforgeHCT & K+; BP today= 134/80 & similar at work; he remains asymptomatic...    Hyperlipid> on Simva40 + Niaspan; last FLP 4/14 looked ok; he understands that improvement here requires him to lose weight!    Obesity> wt= 307# which is up 7# over the interval; we reviewed diet/ exercise/ wt reduction program...    GI> GERD, Rectal fissure> on Prilosec20 & AnusolHC as needed; he saw Dakota Ellison 6/14 for colonoscopy>  2 sm sigmoid polyps removed, int hems, & we don't have path reports; pt told to f/u in 5-67yrs    GU> Low-T & ED>  He takes a bunch of vits & herbs including chromium, cinnamon, vinegar, Dakota Ellison, etc; Dakota Ellison treats him w/ Testos shots & Viagra.    DJD> he uses OTC analgesics, Mobic15, and Vicodin as needed...    Depression> he remains on Zoloft25 & Xanax0.5 as needed... We reviewed prob list, meds, xrays and labs> see below for updates >> given 2014 Flu vaccine; he requests refill for his Vicodin & Xanax...  LABS 10/14:  Chems- ok x BS=113, A1c=6.0 (OK on diet alone)...   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    Hx mild asthma> he still smokes an occas cigar & asked to quit completely!!! He states no attacks in yrs & breathing is OK, not on meds- denies CP, palpit, SOB, edema...    HBP> on ExforgeHCT (10-320-25); BP today= 130/78 & he remains asymptomatic w/o CP, palpit, ch in SOB, edema, etc...    Hyperlipid> on Atorva10 + Niaspan; FLP 5/17 showed TChol 138, TG 187, HDL 39, LDL 61; needs better low fat diet & weight loss!    Obesity> wt= 305# which is stable over the yrs- unable to lose wt effectively; we reviewed diet/ exercise/ wt reduction program...    GI> GERD, Rectal fissure> on Prilosec20 & AnusolHC as needed; he [facility] 6[facility]14 for colonoscopy> 2 sm sigmoid polyps removed, int hems, & we don't have path reports; pt told to f/u in 5-63yrs    GU> Low-T & ED>  He takes a bunch of vits & herbs including garlic, cinnamon, vinegar, ginko, Dakota Ellison, etc; Dakota Ellison treats him w/ Testos shots & Viagra.    DJD> he uses OTC analgesics, Tramadol50, and Vicodin as needed; needs TKRs but he's holding off, needs to lose wt...    Depression> he remains on Zoloft25 & Xanax0.5 as needed... EXAM shows Afeb, VSS, O2sat=96% on RA;  Wt=305# 6'Tall, BMI=41-2;  HEENT- parotid hypertrophy, mallampati2, no bruits;  Chest- clear w/o w/r/r;  Heart- RR w/o m/r/g;  Abd- obese, soft, sl panniculus; Ext- DJD knees, w/o c/c/e...  CXR 10/08/15>  Norm heart size, clear lungs, NAD...  LABS 10/08/15>  FLP- at goals on Lip10 but elev TG & needs better diet/ exerc/ get wt down;  Chems- ok w/ BS=95, A1c=6.2;  CBC- wnl;  TSH=1.59;  PSA=1.10;  UA- clear IMP/PLAN>>  Dakota Ellison returns for a CPX & med follow-up;  Doing reasonably well but same old story- must get on diet & get wt  down (reviewed w/ pt);  Due for pneumonia vaccines- we gave PREVNAR-13 today;  Rx written for shingles vaccine;  Refills written for 90d supplies of Alpraz and Tramadol- continue same meds, get on diet, get wt down...           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 it got worse and was sent to DrWolicki who hospitalized him 3/08 w/ MRSA infection- Rx's w/ Vancomycin infusions... now resolved.  OBSTRUCTIVE SLEEP APNEA (ICD-327.23) - Apr10> had Sleep Study at [facility]> pos for OSA w/ AHI= 82, desat to 85%... split night study w/ good control on CPAP 20 (see report)... started on CPAP by [facility] & still followed by him...  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 no recent exac and no regular meds... told to discontinue all smoking> denies much cough, sputum, dyspnea, CP, etc... ~  CXR 2/09 showed normal heart size, clear lungs, degen changes in Tspine, NAD...   HYPERTENSION (ICD-401.9) - followed by DrGollan/ [facility]> on ECASA 325mg /d & EXFORGE 10/320/25 daily + KCl 83mEq/d... he had CP while hosp w/ the MRSA 2008 and DrWolicki consulted DrMcQueen who did a NuclearStressTest 3/08 which was normal... and a 2DEcho which showed mild MR and nl LVF... ~ 10/12:  f/u visit DrGollan at [facility]> note reviewed, no changes made, DrGollan desperately wants him to lose weight! ~  10/12:  BP= 124/76 doing well, remains asymptomatic... ~  4/13:  BP= 120/78 & he denies CP, palpit, SOB, edema, etc... ~  10/13:  BP= 118/76 & he remains asymptomatic; EKG showed NSR, rate86, wnl, NAD... ~  4/14:  BP= 134/80 & he denies CP, palpit, dizzy, SOB, edema... ~  8/14:  BP= 120/82 & he remains asymptomatic...  HYPERLIPIDEMIA (ICD-272.4) - he's been switched around by [facility] betw Simva/ [place]/ back to SIMVASTATIN 40mg /d now + NIACIN 500mg Bid + FISH OIL 1000mg Bid...  still not on any  kind of diet but his weight is stable at 294#.Marland Kitchen. ~  FLP 1/08 showed TChol 195, TG 119, HDL 41, LDL 131 ~  FLP 2/09 showed TChol 214, TG 214, HDL 39, LDL 127 ~  12/09: recent labs by [facility]- we don't have these results... ~  FLP 5/10 on 3 meds above showed TChol 148, TG 81, HDL 48, LDL 84 ~  FLP 11/10 on 3 meds showed TChol 135, TG 145, HDL 36, LDL 70 ~  FLP 5/11 showed TChol 145, TG 128, HDL 43, LDL 77... keep same Rx. ~  FLP 10/11 showed TChol 152, TG 198, HDL 41, LDL 72... same meds, better diet, get wt down! ~  FLP 4/12 showed TChol 141, TG 200, HDL 39, LDL 62... Similar> needs better low fat diet, get wt down. ~  10/12:  FLP not rechecked today as he has not lost wt etc... ~  FLP 4/13 on Simva40+Niasp1000 showed TChol 137, TG 124, HDL 43, LDL 69 ~  FLP 4/14 on Simva40+Niasp1000 showed TChol 128, TG 167, HDL 35, LDL 59  MORBID OBESITY (ICD-278.01) - despite all efforts weight remains >300#... discussed wt watchers, nutrition Ellison, poss bariatric approach... he's not been able to exercise but will join the Y & start diet program! ~  5/11:  he reports that DrElkins gave him 30d supply of Phenteramine which really helped- lost 20#, but put it all back + 9# more off this med... we discussed the dangers of diet pills & rec diet + exercise, neither of which he has tried in earnest! ~  10/11:  weight up 15# to 314# today> discussed diet, exercise, etc... ~  4/12:  Weight = 309# and needs to do better w/ diet/ exercise... ~  10/12:  Weight = 314# and we  reviewed diet, exercise, wt loss program... ~  4/13:  Weight = 311# ~  10/13:  Weight = 322# ~  4/14:  Weight = 320# ~  8/14:  Weight = 307#  GERD (ICD-530.81) - [facility] which was neg... he takes OMEPRAZOLE 20mg /d for reflux symptoms which works well...  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 prev tried Diltiazem cream, now ANUSOL HC etc... COLON POLYPS >>  ~  [facility]  [facility] & f/u planned 5 yrs> he is overdue & will call for f/u exam... ~  [facility]> 2 sm sigmoid polyps removed, int hems, & we don't have path reports; pt told to f/u in 5-123yrs...  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 also takes Viagra Prn...  ~  labs 5/10 showed PSA = 0.85 ~  labs 5/11 showed PSA= 0.65 ~  Labs 4/12 showed PSA= 0.79 ~  Labs 4/13 showed PSA= 1.44 ~  9/13: seen by Dakota Ellison> treated for hypogonadism w/ Testopel, then Testos shots but he was irregular w/ his visits; prostate exam was wnl; they tried to teach him self injected Testos Q2weeks...  ~  Labs 4/14 showed PSA= 1.30 ~  He had a f/u visit w/ Dakota Ellison 6/14> hypogonadism on testos shots 200mg /ml- getting 1ml every other week & Viagra prn; they checked UA, CBC, Prolactin, Testos level & PSA- we do not have their lab data.   OSTEOARTHRITIS (ICD-715.90) DEGENERATIVE DISC DISEASE (ICD-722.6) - followed by DrMurphy for right knee pain... [facility] and LBP... ~  s/p left CTS surg 2009 ~  s/p right knee arthroscopy by DrMurphy 2/10... also had cortisone shot in knee 4/10 ~  s/p trigger finger injections by DrGramig ~  4/12:  s/p left knee arthroscopy by DrMurphy> improved...  DYSTHYMIA (ICD-300.4) - some stress, some depression which is mild by his determination... he tried Lexapro last year but didn't follow up... now using Xanax as needed, and started ZOLOFT 25mg /d in Feb10- he reports better, wants to continue this dose.  HEALTH MAINTENANCE: ~  [facility]:  [facility] w/ 2 polyps removed- we don't have path. ~  GU:  Followed by Dakota Ellison as above... ~  Immuniz:  He gets Flu shot each yr  at work; he had TDAP at work 2013...   Past Surgical History  Procedure Date  . Sphincterotomy   . Shoulder arthroscopy     right  . Other surgical history     arthroscopic subacromial decompression  . Other surgical history     open resection, distal right clavicle  . Knee surgery 09/2010    left knee    =>  s/p left knee arthroscopy 4/12 by DrMurphy      Outpatient Encounter Prescriptions as of 10/08/2015  Medication Sig  . ALPRAZolam (XANAX) 0.5 MG tablet TAKE 1 TABLET BY MOUTH THREE TIMES DAILYAS NEEDED FOR ANXIETY  . Amlodipine-Valsartan-HCTZ 10-320-25 MG TABS TAKE 1 TABLET BY MOUTH EVERY Dakota  . aspirin 81 MG tablet Take 81 mg by mouth daily.    Marland Kitchen. atorvastatin (LIPITOR) 10 MG tablet TAKE 1 TABLET (10 MG TOTAL) BY MOUTH DAILY.  Marland Kitchen. Cinnamon 500 MG  capsule Take 2,000 mg by mouth daily.   . Garlic 100 MG TABS Take 1 tablet by mouth 2 (two) times daily.  . Ginkgo Biloba 100 MG CAPS Take 60 mg by mouth daily.   . hydrocortisone (PROCTOSOL HC) 2.5 % rectal cream Apply as directed after each BM   . Multiple Vitamin (MULTIVITAMIN) tablet Take 1 tablet by mouth daily.    . niacin 500 MG CR capsule Take 500 mg by mouth 2 (two) times daily.   Marland Kitchen omeprazole (PRILOSEC) 20 MG capsule TAKE ONE CAPSULE BY MOUTH EVERY Dakota  . sertraline (ZOLOFT) 25 MG tablet TAKE 1 TABLET (25 MG TOTAL) BY MOUTH DAILY.  . sildenafil (VIAGRA) 100 MG tablet Take as directed  . testosterone enanthate (DELATESTRYL) 200 MG/ML injection Inject into the muscle every 14 (fourteen) days. For IM use only  . traMADol (ULTRAM) 50 MG tablet TAKE ONE TABLET BY MOUTH EVERY 8 HOURS AS NEEDED  . [DISCONTINUED] testosterone cypionate (DEPOTESTOSTERONE CYPIONATE) 200 MG/ML injection   . [DISCONTINUED] ALPRAZolam (XANAX) 0.5 MG tablet Take 1 tablet (0.5 mg total) by mouth 3 (three) times daily as needed for anxiety. For nerves  . [DISCONTINUED] traMADol (ULTRAM) 50 MG tablet Take 1 tablet (50 mg total) by mouth every 8 (eight) hours as  needed.   No facility-administered encounter medications on file as of 10/08/2015.    Allergies  Allergen Reactions  . Zocor [Simvastatin] Other (See Comments)    Current Medications, Allergies, Past Medical History, Past Surgical History, Family History, and Social History were reviewed in [employer] record.    Review of Systems         See HPI - all other systems neg except as noted... The patient complains of dyspnea on exertion.  The patient denies anorexia, fever, weight loss, weight gain, vision loss, decreased hearing, hoarseness, chest pain, syncope, peripheral edema, prolonged cough, headaches, hemoptysis, abdominal pain, melena, hematochezia, severe indigestion/heartburn, hematuria, incontinence, muscle weakness, suspicious skin lesions, transient blindness, difficulty walking, depression, unusual weight change, abnormal bleeding, enlarged lymph nodes, and angioedema.     Objective:   Physical Exam     WD, Obese, 65 y/o WM in NAD... GENERAL:  Alert & oriented; pleasant & cooperative. HEENT:  [place]/AT, EOM-wnl, PERRLA, EACs-clear, TMs-wnl, NOSE-clear, THROAT-clear & wnl. NECK:  Supple w/ fairROM; no JVD; normal carotid impulses w/o bruits; no thyromegaly or nodules palpated; no lymphadenopathy. CHEST:  Clear to P & A; without wheezes/ rales/ or rhonchi. HEART:  Regular Rhythm; gr 1/6 SEM, without rubs or gallops... ABDOMEN:  Obese soft & nontender; normal bowel sounds; no organomegaly or masses detected.  EXT: without deformities, mild arthritic changes; no varicose veins/ +venous insuffic/ no edema.right knee w/ ace wrap.  NEURO:  CN's intact; no focal neuro deficits... DERM: diffuse sl excoriated papular rash on legs, ankles, feet, groin, buttocks, back, chest> c/w scabies...  RADIOLOGY DATA:  Reviewed in the EPIC EMR & discussed w/ the patient...  LABORATORY DATA:  Reviewed in the EPIC EMR & discussed w/ the patient...   Assessment & Plan:     OSA>  On CPAP per Dakota Ellison now & stable, good compliance, etc...  Asthma>  He has decr cigars but still needs to quit, we discussed smoking cessation but he doesn't want med help etc...  HBP>  Controlled on the ExforgeHCT Rx, he likes the one pill;  discussed the need to lose wt, no salt, etc...  Hyperlipidemia>  FLPs reviewed> if he wants to improve these numbers MUST  get on diet & get wt down!!!  OBESITY>  As above!!!  GU>  Per Dakota Ellison on Testos shots & Viagra...  DJD>  S/p arthroscopic surg left knee by DrMurphy but told he needs TKR & holding off...   Patient's Medications  New Prescriptions   No medications on file  Previous Medications   ALPRAZOLAM (XANAX) 0.5 MG TABLET    TAKE 1 TABLET BY MOUTH THREE TIMES DAILYAS NEEDED FOR ANXIETY   AMLODIPINE-VALSARTAN-HCTZ 10-320-25 MG TABS    TAKE 1 TABLET BY MOUTH EVERY Dakota   ASPIRIN 81 MG TABLET    Take 81 mg by mouth daily.     ATORVASTATIN (LIPITOR) 10 MG TABLET    TAKE 1 TABLET (10 MG TOTAL) BY MOUTH DAILY.   CINNAMON 500 MG CAPSULE    Take 2,000 mg by mouth daily.    GARLIC 100 MG TABS    Take 1 tablet by mouth 2 (two) times daily.   GINKGO BILOBA 100 MG CAPS    Take 60 mg by mouth daily.    HYDROCORTISONE (PROCTOSOL HC) 2.5 % RECTAL CREAM    Apply as directed after each BM    MULTIPLE VITAMIN (MULTIVITAMIN) TABLET    Take 1 tablet by mouth daily.     NIACIN 500 MG CR CAPSULE    Take 500 mg by mouth 2 (two) times daily.    OMEPRAZOLE (PRILOSEC) 20 MG CAPSULE    TAKE ONE CAPSULE BY MOUTH EVERY Dakota   SERTRALINE (ZOLOFT) 25 MG TABLET    TAKE 1 TABLET (25 MG TOTAL) BY MOUTH DAILY.   SILDENAFIL (VIAGRA) 100 MG TABLET    Take as directed   TESTOSTERONE ENANTHATE (DELATESTRYL) 200 MG/ML INJECTION    Inject into the muscle every 14 (fourteen) days. For IM use only   TRAMADOL (ULTRAM) 50 MG TABLET    TAKE ONE TABLET BY MOUTH EVERY 8 HOURS AS NEEDED  Modified Medications   No medications on file  Discontinued Medications    TESTOSTERONE CYPIONATE (DEPOTESTOSTERONE CYPIONATE) 200 MG/ML INJECTION

## 2015-10-09 NOTE — Progress Notes (Signed)
Quick Note:  lmtcb for pt. ______ 

## 2015-10-12 NOTE — Progress Notes (Signed)
Quick Note:  Called and spoke to pt. Informed him of the results and recs per SN. Pt verbalized understanding and denied any further questions or concerns at this time.   ______ 

## 2015-11-09 ENCOUNTER — Other Ambulatory Visit: Payer: Self-pay | Admitting: Neurosurgery

## 2015-11-09 DIAGNOSIS — M47816 Spondylosis without myelopathy or radiculopathy, lumbar region: Secondary | ICD-10-CM

## 2015-11-16 ENCOUNTER — Ambulatory Visit
Admission: RE | Admit: 2015-11-16 | Discharge: 2015-11-16 | Disposition: A | Discharge status: Home / Self Care | Payer: BLUE CROSS/BLUE SHIELD | Source: Ambulatory Visit | Attending: Neurosurgery | Admitting: Neurosurgery

## 2015-11-16 ENCOUNTER — Other Ambulatory Visit: Payer: Self-pay | Admitting: Neurosurgery

## 2015-11-16 DIAGNOSIS — M47816 Spondylosis without myelopathy or radiculopathy, lumbar region: Secondary | ICD-10-CM

## 2015-11-16 MED ORDER — METHYLPREDNISOLONE ACETATE 40 MG/ML INJ SUSP (RADIOLOG
120.0000 mg | Freq: Once | INTRAMUSCULAR | Status: AC
  Administered 2015-11-16: 120 mg via INTRA_ARTICULAR

## 2015-11-16 MED ORDER — IOPAMIDOL (ISOVUE-M 200) INJECTION 41%
1.0000 mL | Freq: Once | INTRAMUSCULAR | Status: AC
  Administered 2015-11-16: 1 mL via INTRA_ARTICULAR

## 2015-11-16 NOTE — Discharge Instructions (Signed)

## 2015-12-14 ENCOUNTER — Other Ambulatory Visit: Payer: Self-pay | Admitting: Internal Medicine

## 2015-12-15 ENCOUNTER — Other Ambulatory Visit: Payer: Self-pay | Admitting: *Deleted

## 2015-12-15 MED ORDER — AMLODIPINE-VALSARTAN-HCTZ 10-320-25 MG PO TABS: 1.0000 | ORAL_TABLET | Freq: Every day | ORAL | Status: DC

## 2015-12-18 ENCOUNTER — Other Ambulatory Visit: Payer: Self-pay | Admitting: Internal Medicine

## 2015-12-25 ENCOUNTER — Other Ambulatory Visit: Payer: Self-pay | Admitting: Neurosurgery

## 2015-12-25 DIAGNOSIS — M47817 Spondylosis without myelopathy or radiculopathy, lumbosacral region: Secondary | ICD-10-CM

## 2016-01-19 ENCOUNTER — Ambulatory Visit
Admission: RE | Admit: 2016-01-19 | Discharge: 2016-01-19 | Disposition: A | Discharge status: Home / Self Care | Payer: BLUE CROSS/BLUE SHIELD | Source: Ambulatory Visit | Attending: Neurosurgery | Admitting: Neurosurgery

## 2016-01-19 DIAGNOSIS — M47817 Spondylosis without myelopathy or radiculopathy, lumbosacral region: Secondary | ICD-10-CM

## 2016-01-19 MED ORDER — METHYLPREDNISOLONE ACETATE 40 MG/ML INJ SUSP (RADIOLOG
120.0000 mg | Freq: Once | INTRAMUSCULAR | Status: AC
  Administered 2016-01-19: 120 mg via INTRA_ARTICULAR

## 2016-01-19 MED ORDER — IOPAMIDOL (ISOVUE-M 200) INJECTION 41%
1.0000 mL | Freq: Once | INTRAMUSCULAR | Status: AC
  Administered 2016-01-19: 1 mL via INTRA_ARTICULAR

## 2016-01-19 NOTE — Discharge Instructions (Signed)

## 2016-03-13 ENCOUNTER — Other Ambulatory Visit: Payer: Self-pay | Admitting: Internal Medicine

## 2016-03-28 ENCOUNTER — Ambulatory Visit (INDEPENDENT_AMBULATORY_CARE_PROVIDER_SITE_OTHER): Payer: BLUE CROSS/BLUE SHIELD | Admitting: Cardiovascular Disease

## 2016-03-28 ENCOUNTER — Encounter: Payer: Self-pay | Admitting: Cardiovascular Disease

## 2016-03-28 VITALS — BP 112/74 | HR 97 | Ht 72.0 in | Wt 294.5 lb

## 2016-03-28 DIAGNOSIS — G4733 Obstructive sleep apnea (adult) (pediatric): Secondary | ICD-10-CM | POA: Diagnosis not present

## 2016-03-28 DIAGNOSIS — I10 Essential (primary) hypertension: Secondary | ICD-10-CM

## 2016-03-28 DIAGNOSIS — E7800 Pure hypercholesterolemia, unspecified: Secondary | ICD-10-CM

## 2016-03-28 DIAGNOSIS — K047 Periapical abscess without sinus: Secondary | ICD-10-CM

## 2016-03-28 DIAGNOSIS — Z9989 Dependence on other enabling machines and devices: Secondary | ICD-10-CM

## 2016-03-28 NOTE — Patient Instructions (Signed)

## 2016-03-28 NOTE — Progress Notes (Signed)
Cardiology Office Note  Date:  03/28/2016   ID:  Dakota Ellison, Dakota Ellison 03/23/1951, MRN [id number]  PCP:  Dakota Mcalpine, MD   Chief Complaint  Patient presents with  . other    12 month follow up. Meds reviewed by the pt. verbally. "doing well."    HPI:  [name] is a pleasant 65 year old gentleman with a history of obesity, obstructive sleep apnea, hypertension, hyperlipidemia who presents for routine followup of his hypertension   In follow-up today he reports he is doing well Recent root canal on the right, got infected, severe pain Did a bone graft Went to the dentist this morning, staples out, infection drained Still with severe pain Significant weight loss over the past year  Denies any chest pain concerning for angina Overall feels well with no complaints, active Still works for long for Product/process development scientist work reviewed with him in detail Total chol 138, LDL 61 AIC 6.2  Active, retired, 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    He does not do a regular exercise program.  He is able to tolerate his CPAP.  EKG on today's visit shows normal sinus rhythm with rate 97 bpm, no significant ST or T-wave changes  Other past medical history previous lower extremity ultrasound. By his report, this was normal with no evidence of PAD.  Last echocardiogram in March 2008 showing normal systolic function, right ventricle mildly dilated, mild mitral regurg, mild tricuspid regurg. Last stress test in March 2008 where he achieved 10 mets, exercised for 9 minutes, no ischemia noted. Overall a normal,  low-risk scan.     PMH:   has a past medical history of Anxiety state (04/08/2014); Degenerative disc disease; Dysthymia; ED (erectile dysfunction); GERD (gastroesophageal reflux disease); History of MRSA infection; Hyperlipemia; Hypertension; Mild asthma; Morbid obesity (HCC); OSA (obstructive sleep apnea); Osteoarthritis; Rectal fissure; and Testosterone deficiency.  PSH:    Past Surgical  History:  Procedure Laterality Date  . KNEE SURGERY  09/2010   left knee  . OTHER SURGICAL HISTORY     arthroscopic subacromial decompression  . OTHER SURGICAL HISTORY     open resection, distal right clavicle  . SHOULDER ARTHROSCOPY     right  . SPHINCTEROTOMY      Current Outpatient Prescriptions  Medication Sig Dispense Refill  . ALPRAZolam (XANAX) 0.5 MG tablet TAKE 1 TABLET BY MOUTH THREE TIMES DAILYAS NEEDED FOR ANXIETY 90 tablet 1  . Amlodipine-Valsartan-HCTZ 10-320-25 MG TABS TAKE 1 TABLET BY MOUTH DAILY. YEARLY PHYSICAL W/LABS IS DUE MUST SEE MD FOR REFILLS 90 tablet 0  . aspirin 81 MG tablet Take 81 mg by mouth daily.      Marland Kitchen atorvastatin (LIPITOR) 10 MG tablet TAKE 1 TABLET (10 MG TOTAL) BY MOUTH DAILY. 90 tablet 1  . Cinnamon 500 MG capsule Take 2,000 mg by mouth daily.     . Garlic 100 MG TABS Take 1 tablet by mouth 2 (two) times daily.    . Ginkgo Biloba 100 MG CAPS Take 60 mg by mouth daily.     . hydrocortisone (PROCTOSOL HC) 2.5 % rectal cream Apply as directed after each BM     . Multiple Vitamin (MULTIVITAMIN) tablet Take 1 tablet by mouth daily.      . niacin 500 MG CR capsule Take 500 mg by mouth 2 (two) times daily.     Marland Kitchen omeprazole (PRILOSEC) 20 MG capsule TAKE ONE CAPSULE BY MOUTH EVERY DAY 90 capsule 1  . sertraline (ZOLOFT) 25  MG tablet TAKE 1 TABLET (25 MG TOTAL) BY MOUTH DAILY. 90 tablet 2  . sildenafil (VIAGRA) 100 MG tablet Take as directed 10 tablet 5  . testosterone enanthate (DELATESTRYL) 200 MG/ML injection Inject into the muscle every 14 (fourteen) days. For IM use only    . traMADol (ULTRAM) 50 MG tablet TAKE ONE TABLET BY MOUTH EVERY 8 HOURS AS NEEDED 90 tablet 5   No current facility-administered medications for this visit.      Allergies:   Zocor [simvastatin]   Social History:  The patient  reports that he has never smoked. He has never used smokeless tobacco. He reports that he does not drink alcohol or use drugs.   Family History:    family history includes Pneumonia in his brother.    Review of Systems: Review of Systems  Constitutional: Negative.   Respiratory: Negative.   Cardiovascular: Negative.   Gastrointestinal: Negative.   Musculoskeletal: Negative.   Neurological: Negative.   Psychiatric/Behavioral: Negative.   All other systems reviewed and are negative.    PHYSICAL EXAM: VS:  BP 112/74 (BP Location: Left Arm, Patient Position: Sitting, Cuff Size: Large)   Pulse 97   Ht 6' (1.829 m)   Wt 294 lb 8 oz (133.6 kg)   BMI 39.94 kg/m  , BMI Body mass index is 39.94 kg/m. GEN: Well nourished, well developed, in no acute distress  HEENT: normal  Neck: no JVD, carotid bruits, or masses Cardiac: RRR; no murmurs, rubs, or gallops,no edema  Respiratory:  clear to auscultation bilaterally, normal work of breathing GI: soft, nontender, nondistended, + BS MS: no deformity or atrophy  Skin: warm and dry, no rash Neuro:  Strength and sensation are intact Psych: euthymic mood, full affect    Recent Labs: 10/08/2015: ALT 29; ALT 29; BUN 20; BUN 20; Creatinine, Ser 1.00; Creatinine, Ser 1.00; Hemoglobin 17.0; Platelets 269.0; Potassium 4.4; Potassium 4.4; Sodium 140; Sodium 140; TSH 1.59    Lipid Panel Lab Results  Component Value Date   CHOL 138 10/08/2015   HDL 39.40 10/08/2015   LDLCALC 61 10/08/2015   TRIG 187.0 (H) 10/08/2015      Wt Readings from Last 3 Encounters:  03/28/16 294 lb 8 oz (133.6 kg)  10/08/15 (!) 305 lb (138.3 kg)  07/27/15 (!) 314 lb (142.4 kg)       ASSESSMENT AND PLAN:  Essential hypertension - Plan: EKG 12-Lead Blood pressure is well controlled on today's visit. No changes made to the medications.  Morbid obesity (HCC) 20 pound weight loss over the past year Encouraged him to continue his current weight loss trend  Pure hypercholesterolemia Cholesterol is at goal on the current lipid regimen. No changes to the medications were made.  Obstructive sleep apnea on  CPAP Tolerating his CPAP  Dental abscess Currently on antibiotics, seen by dentist this morning Severe pain. Dentist has provided pain medication   Total encounter time more than 15 minutes  Greater than 50% was spent in counseling and coordination of care with the patient   Disposition:   F/U  12 months   Orders Placed This Encounter  Procedures  . EKG 12-Lead     Signed, Dossie Arbourim Gollan, M.D., Ph.D. 03/28/2016  [facility] [place] [phone]

## 2016-04-05 ENCOUNTER — Ambulatory Visit: Payer: BLUE CROSS/BLUE SHIELD | Admitting: Family Medicine

## 2016-04-11 ENCOUNTER — Encounter: Payer: Self-pay | Admitting: Pulmonary Disease

## 2016-04-11 ENCOUNTER — Ambulatory Visit (INDEPENDENT_AMBULATORY_CARE_PROVIDER_SITE_OTHER): Payer: BLUE CROSS/BLUE SHIELD | Admitting: Pulmonary Disease

## 2016-04-11 ENCOUNTER — Ambulatory Visit: Payer: BLUE CROSS/BLUE SHIELD | Admitting: Pulmonary Disease

## 2016-04-11 VITALS — BP 128/80 | HR 88 | Temp 98.6°F | Ht 72.0 in | Wt 297.0 lb

## 2016-04-11 DIAGNOSIS — F341 Dysthymic disorder: Secondary | ICD-10-CM

## 2016-04-11 DIAGNOSIS — M150 Primary generalized (osteo)arthritis: Secondary | ICD-10-CM | POA: Diagnosis not present

## 2016-04-11 DIAGNOSIS — I10 Essential (primary) hypertension: Secondary | ICD-10-CM | POA: Diagnosis not present

## 2016-04-11 DIAGNOSIS — M545 Low back pain: Secondary | ICD-10-CM

## 2016-04-11 DIAGNOSIS — M159 Polyosteoarthritis, unspecified: Secondary | ICD-10-CM

## 2016-04-11 DIAGNOSIS — K219 Gastro-esophageal reflux disease without esophagitis: Secondary | ICD-10-CM

## 2016-04-11 DIAGNOSIS — G4733 Obstructive sleep apnea (adult) (pediatric): Secondary | ICD-10-CM | POA: Diagnosis not present

## 2016-04-11 DIAGNOSIS — Z9989 Dependence on other enabling machines and devices: Secondary | ICD-10-CM | POA: Diagnosis not present

## 2016-04-11 DIAGNOSIS — R7302 Impaired glucose tolerance (oral): Secondary | ICD-10-CM

## 2016-04-11 DIAGNOSIS — G8929 Other chronic pain: Secondary | ICD-10-CM

## 2016-04-11 DIAGNOSIS — E349 Endocrine disorder, unspecified: Secondary | ICD-10-CM

## 2016-04-11 DIAGNOSIS — E782 Mixed hyperlipidemia: Secondary | ICD-10-CM | POA: Diagnosis not present

## 2016-04-11 DIAGNOSIS — M25562 Pain in left knee: Secondary | ICD-10-CM

## 2016-04-11 MED ORDER — ALPRAZOLAM 0.5 MG PO TABS: ORAL_TABLET | ORAL | 5 refills | Status: DC

## 2016-04-11 MED ORDER — TRAMADOL HCL 50 MG PO TABS: 50.0000 mg | ORAL_TABLET | Freq: Three times a day (TID) | ORAL | 5 refills | Status: DC | PRN

## 2016-04-11 NOTE — Patient Instructions (Signed)
Today we updated your med list in our EPIC system...    Continue your current medications the same...    We refilled your meds per request...  Keep up the good work w/ diet & exercise...    The goal is to lose 15-20 lbs in the interval...  Call for any questions...  Let's plan a follow up visit in 46mo, sooner if needed for problems.Marland Kitchen..Marland Kitchen

## 2016-04-11 NOTE — Progress Notes (Addendum)
Subjective:    Patient ID: Dakota Ellison, male    DOB: [birth year], 65 y.o.   MRN: [id number]  HPI 65 y/o WM here for an add-on visit... he has multiple medical problems as noted below...   ~  SEE PREV EPIC NOTES FOR OLDER DATA >>    LABS 4/13:  FLP- at goals on Simva40;  Chems- ok w/ BS=106;  CBC- wnl;  TSH=2.20;  PSA=1.44  LABS 10/13:  Chems- ok w/ BS106...   ~  October 01, 2012:  477mo ROV & Dakota Ellison has an appt coming up w/ DrGerkin, [facility] to see about his inguinal hernias R>L; he is also interested in discussing poss Bariatric Surg;  He notes that his weight is about the same but he has been unable to lose;  Resting well w/ CPAP from [facility], BP remains controlled on hisExforgeHCT, Chol has been good on the Simva40, etc...     We reviewed prob list, meds, xrays and labs> see below for updates >>   LABS 4/14:  FLP- ok x TG=167 on Simva40 & needs better low fat diet & wt reduction;  Chems- wnl w/ BS=94 & A1c=5.9;  CBC- wnl;  TSH=2.39;  PSA=1.30...  ~  January 29, 2013:  31mo ROV & acute visit request add-on for puritic rash> states it started several weeks ago, red sl excoriated rash on legs/ feet/ ankles (including betw the toes), groin, one on penis, buttock, back, chest; very pruritic- esp at night & discrete macules/ papules are all c/w scabies...  Discussed w/ pt & rec treatment w/ KWELL lotion (wash, dry, apply for 12h & wash off, repeat process in 1wk if not completely resolved; also wrote for Atarax25mg  prn itching....    He saw DrGerkin 5/14> he felt the right inguinal hernia but could not find a recurrent  left sided hernia on exam (prev [facility] repair 1998); they did CT Pelvis- showed RIH containing fat & prev LIH repair w/o recurrence, min calcif atherosclerotic changes noted...     Dakota Ellison saw [facility] 6/14 for colonoscopy> 2 sm sigmoid polyps removed, int hems, & we don't have path reports; pt told to f/u in 5-71yrs...    He had a f/u visit w/ DrDahlstedt 6/14> hypogonadism on testos shots  200mg /ml- getting 1ml every other week & Viagra prn; they checked UA, CBC, Prolactin, Testos level & PSA- we do not have their lab data... We reviewed prob list, meds, xrays and labs> see below for updates >>   ~  April 01, 2013:  77mo ROV & Dakota Ellison requests Rx for new CPAP machine- his is broken- but studies & prev Rx per [facility] and we discussed this    OSA> he had an AHI=82 in 2010 & started on CPAP20 by Howell Rucks, [facility] & he continues follow up there (we do not have notes)...    Hx mild asthma> he still smokes an occas cigar & asked to quit completely!!! He states no attacks in yrs & breathing is OK, not on meds- denies CP, palpit, SOB, edema...    HBP> on ExforgeHCT & K+; BP today= 134/80 & similar at work; he remains asymptomatic...    Hyperlipid> on Simva40 + Niaspan; last FLP 4/14 looked ok; he understands that improvement here requires him to lose weight!    Obesity> wt= 307# which is up 7# over the interval; we reviewed diet/ exercise/ wt reduction program...    GI> GERD, Rectal fissure> on Prilosec20 & AnusolHC as needed; he saw [facility] 6/14 for colonoscopy>  2 sm sigmoid polyps removed, int hems, & we don't have path reports; pt told to f/u in 5-7639yrs    GU> Low-T & ED>  He takes a bunch of vits & herbs including chromium, cinnamon, vinegar, MVI, etc; DrDahlstedt treats him w/ Testos shots & Viagra.    DJD> he uses OTC analgesics, Mobic15, and Vicodin as needed...    Depression> he remains on Zoloft25 & Xanax0.5 as needed... We reviewed prob list, meds, xrays and labs> see below for updates >> given 2014 Flu vaccine; he requests refill for his Vicodin & Xanax...  LABS 10/14:  Chems- ok x BS=113, A1c=6.0 (OK on diet alone)...   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    Hx mild asthma> he still smokes an occas cigar & asked to quit completely!!! He states no attacks in yrs & breathing is OK, not on meds- denies CP, palpit, SOB, edema...    HBP> on ExforgeHCT (10-320-25); BP today= 130/78 & he remains asymptomatic w/o CP, palpit, ch in SOB, edema, etc...    Hyperlipid> on Atorva10 + Niaspan; FLP 5/17 showed TChol 138, TG 187, HDL 39, LDL 61; needs better low fat diet & weight loss!    Obesity> wt= 305# which is stable over the yrs- unable to lose wt effectively; we reviewed diet/ exercise/ wt reduction program...    GI> GERD, Rectal fissure> on Prilosec20 & AnusolHC as needed; [facility] 2 sm sigmoid polyps removed, int hems, & we don't have path reports; pt told to f/u in 5-6439yrs    GU> Low-T & ED>  He takes a bunch of vits & herbs including garlic, cinnamon, vinegar, ginko, MVI, etc; DrDahlstedt treats him w/ Testos shots & Viagra.    DJD> he uses OTC analgesics, Tramadol50, and Vicodin as needed; needs TKRs but he's holding off, needs to lose wt...    Depression> he remains on Zoloft25 & Xanax0.5 as needed... EXAM shows Afeb, VSS, O2sat=96% on RA;  Wt=305# 6'Tall, BMI=41-2;  HEENT- parotid hypertrophy, mallampati2, no bruits;  Chest- clear w/o w/r/r;  Heart- RR w/o m/r/g;  Abd- obese, soft, sl panniculus; Ext- DJD knees, w/o c/c/e...  CXR 10/08/15>  Norm heart size, clear lungs, NAD...  LABS 10/08/15>  FLP- at goals on Lip10 but elev TG & needs better diet/ exerc/ get wt down;  Chems- ok w/ BS=95, A1c=6.2;  CBC- wnl;  TSH=1.59;  PSA=1.10;  UA- clear IMP/PLAN>>  Dakota Ellison returns for a CPX & med follow-up;  Doing reasonably well but same old story- must get on diet & get wt  down (reviewed w/ pt);  Due for pneumonia vaccines- we gave PREVNAR-13 today;  Rx written for shingles vaccine;  Refills written for 90d supplies of Alpraz and Tramadol- continue same meds, get on diet, get wt down...   ~  April 11, 2016:  61mo ROV w/ SN>  Dakota Ellison returns and  notes recent dental problems w/ infection & root canal;  He has been seeing ORTHO for his knee, getting shots, but told he needs TKR;  He has also been seeing NS-DrRoy for back pain but we do not have notes from these specialists to review...     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     OSA> he had an AHI=82 in 2010 & started on CPAP20 by Howell Rucks, [facility] & he has had sleep f/u by DrClance=> DrYoung seen 07/27/15 and stable.    Hx mild asthma> he still smokes an occas cigar & asked to quit completely!!! He states no attacks in yrs & breathing is OK, not on meds- denies CP, palpit, SOB, edema...    HBP> on ExforgeHCT (10-320-25); BP today= 128/80 & he remains asymptomatic w/o CP, palpit, ch in SOB, edema, etc...    Hyperlipid> on Atorva10 + Niaspan; FLP 5/17 showed TChol 138, TG 187, HDL 39, LDL 61; needs better low fat diet & weight loss!    Obesity> wt= 297# which is down8#- prev unable to lose wt effectively; we reviewed diet/ exercise/ wt reduction program...    GI> GERD, Rectal fissure> on Prilosec20 & AnusolHC as needed; he [facility] 6[facility]14 for colonoscopy> 2 sm sigmoid polyps removed, int hems, & we don't have path reports; pt told to f/u in 5-38yrs    GU> Low-T & ED>  He takes a bunch of vits & herbs including garlic, cinnamon, vinegar, ginko, MVI, etc; DrDahlstedt treats him w/ Testos shots & Viagra.    DJD> he uses OTC analgesics, Tramadol50, and Vicodin as needed; needs TKRs but he's holding off, needs to lose wt...    Depression> he remains on Zoloft25 & Xanax0.5 as needed... EXAM  shows Afeb, VSS, O2sat=96% on RA;  Wt=305# 6'Tall, BMI=41-2;  HEENT- parotid hypertrophy, mallampati2, no bruits;  Chest- clear w/o w/r/r;  Heart- RR w/o m/r/g;  Abd- obese, soft, sl panniculus; Ext- DJD knees, w/o c/c/e... IMP/PLAN>>  Dakota Ellison is overall stable w/ dental issues + Ortho (knee) and Neuro (back pain);  meds refilled and he will continue to f/u w/ the specalists and aske for notes to be sent her for epic... NOTE: >50% of this rov was spent in counseling 7 coordination of care...          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 it got worse and was sent to DrWolicki who hospitalized him 3/08 w/ MRSA infection- Rx's w/ Vancomycin infusions... now resolved.  OBSTRUCTIVE SLEEP APNEA (ICD-327.23) - Apr10> had Sleep Study at [facility]> pos for OSA w/ AHI= 82, desat to 85%... split night study w/ good control on CPAP 20 (see report)... started on CPAP by [facility] & still followed by him...  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 no recent exac and no regular meds... told to discontinue all smoking> denies much cough, sputum, dyspnea, CP, etc... ~  CXR 2/09 showed normal heart size, clear lungs, degen changes in Tspine, NAD...   HYPERTENSION (ICD-401.9) - followed by DrGollan/ [facility]> on ECASA 325mg /d & EXFORGE 10/320/25 daily + KCl 49mEq/d... he had CP while hosp w/ the MRSA 2008 and DrWolicki consulted DrMcQueen who did a NuclearStressTest 3/08 which was normal..Marland Kitchen  and a 2DEcho which showed mild MR and nl LVF... ~ 10/12:  f/u visit DrGollan at [facility]> note reviewed, no changes made, DrGollan desperately wants him to lose weight! ~  10/12:  BP= 124/76 doing well, remains asymptomatic... ~  4/13:  BP= 120/78 & he denies CP, palpit, SOB, edema, etc... ~  10/13:  BP= 118/76 & he remains asymptomatic; EKG showed NSR, rate86, wnl, NAD... ~  4/14:   BP= 134/80 & he denies CP, palpit, dizzy, SOB, edema... ~  8/14:  BP= 120/82 & he remains asymptomatic...  HYPERLIPIDEMIA (ICD-272.4) - he's been switched around by [facility] betw Simva/ Caduet/ back to SIMVASTATIN 40mg /d now + NIACIN 500mg Bid + FISH OIL 1000mg Bid...  still not on any kind of diet but his weight is stable at 294#.Marland Kitchen. ~  FLP 1/08 showed TChol 195, TG 119, HDL 41, LDL 131 ~  FLP 2/09 showed TChol 214, TG 214, HDL 39, LDL 127 ~  12/09: [facility]- we don't have these results... ~  FLP 5/10 on 3 meds above showed TChol 148, TG 81, HDL 48, LDL 84 ~  FLP 11/10 on 3 meds showed TChol 135, TG 145, HDL 36, LDL 70 ~  FLP 5/11 showed TChol 145, TG 128, HDL 43, LDL 77... keep same Rx. ~  FLP 10/11 showed TChol 152, TG 198, HDL 41, LDL 72... same meds, better diet, get wt down! ~  FLP 4/12 showed TChol 141, TG 200, HDL 39, LDL 62... Similar> needs better low fat diet, get wt down. ~  10/12:  FLP not rechecked today as he has not lost wt etc... ~  FLP 4/13 on Simva40+Niasp1000 showed TChol 137, TG 124, HDL 43, LDL 69 ~  FLP 4/14 on Simva40+Niasp1000 showed TChol 128, TG 167, HDL 35, LDL 59  MORBID OBESITY (ICD-278.01) - despite all efforts weight remains >300#... discussed wt watchers, nutrition center, poss bariatric approach... he's not been able to exercise but will join the Y & start diet program! ~  5/11:  he reports that DrElkins gave him 30d supply of Phenteramine which really helped- lost 20#, but put it all back + 9# more off this med... we discussed the dangers of diet pills & rec diet + exercise, neither of which he has tried in earnest! ~  10/11:  weight up 15# to 314# today> discussed diet, exercise, etc... ~  4/12:  Weight = 309# and needs to do better w/ diet/ exercise... ~  10/12:  Weight = 314# and we reviewed diet, exercise, wt loss program... ~  4/13:  Weight = 311# ~  10/13:  Weight = 322# ~  4/14:  Weight = 320# ~  8/14:  Weight = 307#  GERD (ICD-530.81) - he had  an EGD from [facility] 4/03 which was neg... he takes OMEPRAZOLE 20mg /d for reflux symptoms which works well...  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 prev tried Diltiazem cream, now ANUSOL HC etc... COLON POLYPS >>  ~  pt reports colonoscopy by [facility] 7/03- reported neg & f/u planned 5 yrs> he is overdue & will call for f/u exam... ~  Abubakar saw [facility] 6/14 for colonoscopy> 2 sm sigmoid polyps removed, int hems, & we don't have path reports; pt told to f/u in 5-34yrs...  TESTOSTERONE DEFICIENCY (ICD-257.2) &  ERECTILE DYSFUNCTION (ICD-302.72) - prev on TESTIM Gel 1%= 5gm daily and followed by DrDahlstadt> changed to Testopel Implants, but he tells me these are back ordered &  he was getting Testos shots thru the Urology office... also takes Viagra Prn...  ~  labs 5/10 showed PSA = 0.85 ~  labs 5/11 showed PSA= 0.65 ~  Labs 4/12 showed PSA= 0.79 ~  Labs 4/13 showed PSA= 1.44 ~  9/13: seen by DrDahlstedt> treated for hypogonadism w/ Testopel, then Testos shots but he was irregular w/ his visits; prostate exam was wnl; they tried to teach him self injected Testos Q2weeks...  ~  Labs 4/14 showed PSA= 1.30 ~  He had a f/u visit w/ DrDahlstedt 6/14> hypogonadism on testos shots 200mg /ml- getting 1ml every other week & Viagra prn; they checked UA, CBC, Prolactin, Testos level & PSA- we do not have their lab data.   OSTEOARTHRITIS (ICD-715.90) DEGENERATIVE DISC DISEASE (ICD-722.6) - followed by DrMurphy for right knee pain... prev [facility] for D[facility] and LBP... ~  s/p left CTS surg 2009 ~  s/p right knee arthroscopy by DrMurphy 2/10... also had cortisone shot in knee 4/10 ~  s/p trigger finger injections by DrGramig ~  4/12:  s/p left knee arthroscopy by DrMurphy> improved...  DYSTHYMIA (ICD-300.4) - some stress, some depression which is mild by his determination... he tried Lexapro last year but didn't follow up... now using Xanax as needed, and  started ZOLOFT 25mg /d in Feb10- he reports better, wants to continue this dose.  HEALTH MAINTENANCE: ~  [facility]  [facility] w/ 2 polyps removed- we don't have path. ~  GU:  Followed by DrDahlstedt as above... ~  Immuniz:  He gets Flu shot each yr at work; he had TDAP at work 2013...   Past Surgical History  Procedure Date  . Sphincterotomy   . Shoulder arthroscopy     right  . Other surgical history     arthroscopic subacromial decompression  . Other surgical history     open resection, distal right clavicle  . Knee surgery 09/2010    left knee    =>  s/p left knee arthroscopy 4/12 by DrMurphy      Outpatient Encounter Prescriptions as of 04/11/2016  Medication Sig Dispense Refill  . ALPRAZolam (XANAX) 0.5 MG tablet TAKE 1 TABLET BY MOUTH THREE TIMES DAILYAS NEEDED FOR ANXIETY 90 tablet 5  . Amlodipine-Valsartan-HCTZ 10-320-25 MG TABS TAKE 1 TABLET BY MOUTH DAILY. YEARLY PHYSICAL W/LABS IS DUE MUST SEE MD FOR REFILLS 90 tablet 0  . aspirin 81 MG tablet Take 81 mg by mouth daily.      Marland Kitchen. atorvastatin (LIPITOR) 10 MG tablet TAKE 1 TABLET (10 MG TOTAL) BY MOUTH DAILY. 90 tablet 1  . Cinnamon 500 MG capsule Take 2,000 mg by mouth daily.     . Garlic 100 MG TABS Take 1 tablet by mouth 2 (two) times daily.    . Ginkgo Biloba 100 MG CAPS Take 60 mg by mouth daily.     . hydrocortisone (PROCTOSOL HC) 2.5 % rectal cream Apply as directed after each BM     . Multiple Vitamin (MULTIVITAMIN) tablet Take 1 tablet by mouth daily.      . niacin 500 MG CR capsule Take 500 mg by mouth 2 (two) times daily.     Marland Kitchen. omeprazole (PRILOSEC) 20 MG capsule TAKE ONE CAPSULE BY MOUTH EVERY DAY 90 capsule 1  . sertraline (ZOLOFT) 25 MG tablet TAKE 1 TABLET (25 MG TOTAL) BY MOUTH DAILY. 90 tablet 2  . sildenafil (VIAGRA) 100 MG tablet Take as directed 10 tablet 5  .  testosterone enanthate (DELATESTRYL) 200 MG/ML injection Inject into the muscle every 14 (fourteen) days. For IM use only     . traMADol (ULTRAM) 50 MG tablet Take 1 tablet (50 mg total) by mouth every 8 (eight) hours as needed. 90 tablet 5  . [DISCONTINUED] ALPRAZolam (XANAX) 0.5 MG tablet TAKE 1 TABLET BY MOUTH THREE TIMES DAILYAS NEEDED FOR ANXIETY 90 tablet 1  . [DISCONTINUED] traMADol (ULTRAM) 50 MG tablet TAKE ONE TABLET BY MOUTH EVERY 8 HOURS AS NEEDED 90 tablet 5   No facility-administered encounter medications on file as of 04/11/2016.     Allergies  Allergen Reactions  . Zocor [Simvastatin] Other (See Comments)    Immunization History  Administered Date(s) Administered  . Influenza Split 03/25/2011, 03/23/2012, 03/18/2013  . Influenza Whole 03/06/2009, 03/08/2010  . Influenza,inj,Quad PF,36+ Mos 03/06/2014, 03/28/2016  . Influenza-Unspecified 03/21/2015  . Pneumococcal Conjugate-13 10/08/2015  . Tdap 03/07/2011  . Zoster 10/23/2015    Current Medications, Allergies, Past Medical History, Past Surgical History, Family History, and Social History were reviewed in [employer] record.    Review of Systems         See HPI - all other systems neg except as noted... The patient complains of dyspnea on exertion.  The patient denies anorexia, fever, weight loss, weight gain, vision loss, decreased hearing, hoarseness, chest pain, syncope, peripheral edema, prolonged cough, headaches, hemoptysis, abdominal pain, melena, hematochezia, severe indigestion/heartburn, hematuria, incontinence, muscle weakness, suspicious skin lesions, transient blindness, difficulty walking, depression, unusual weight change, abnormal bleeding, enlarged lymph nodes, and angioedema.     Objective:   Physical Exam     WD, Obese, 65 y/o WM in NAD... GENERAL:  Alert & oriented; pleasant & cooperative. HEENT:  [place]/AT, EOM-wnl, PERRLA, EACs-clear, TMs-wnl, NOSE-clear, THROAT-clear & wnl. NECK:  Supple w/ fairROM; no JVD; normal carotid impulses w/o bruits; no thyromegaly or nodules palpated; no  lymphadenopathy. CHEST:  Clear to P & A; without wheezes/ rales/ or rhonchi. HEART:  Regular Rhythm; gr 1/6 SEM, without rubs or gallops... ABDOMEN:  Obese soft & nontender; normal bowel sounds; no organomegaly or masses detected.  EXT: without deformities, mild arthritic changes; no varicose veins/ +venous insuffic/ no edema.right knee w/ ace wrap.  NEURO:  CN's intact; no focal neuro deficits... DERM: diffuse sl excoriated papular rash on legs, ankles, feet, groin, buttocks, back, chest> c/w scabies...  RADIOLOGY DATA:  Reviewed in the EPIC EMR & discussed w/ the patient...  LABORATORY DATA:  Reviewed in the EPIC EMR & discussed w/ the patient...   Assessment & Plan:    OSA>  On CPAP per DrYoung now & stable, good compliance, etc...  Asthma>  He has decr cigars but still needs to quit, we discussed smoking cessation but he doesn't want med help etc...  HBP>  Controlled on the ExforgeHCT Rx, he likes the one pill;  discussed the need to lose wt, no salt, etc...  Hyperlipidemia>  FLPs reviewed> if he wants to improve these numbers MUST get on diet & get wt down!!!  OBESITY>  As above!!!  GU>  Per DrDahlstedt on Testos shots & Viagra...  DJD>  S/p arthroscopic surg left knee by DrMurphy but told he needs TKR & holding off...   Patient's Medications  New Prescriptions   No medications on file  Previous Medications   AMLODIPINE-VALSARTAN-HCTZ 10-320-25 MG TABS    TAKE 1 TABLET BY MOUTH DAILY. YEARLY PHYSICAL W/LABS IS DUE MUST SEE MD FOR REFILLS   ASPIRIN 81 MG  TABLET    Take 81 mg by mouth daily.     ATORVASTATIN (LIPITOR) 10 MG TABLET    TAKE 1 TABLET (10 MG TOTAL) BY MOUTH DAILY.   CINNAMON 500 MG CAPSULE    Take 2,000 mg by mouth daily.    GARLIC 100 MG TABS    Take 1 tablet by mouth 2 (two) times daily.   GINKGO BILOBA 100 MG CAPS    Take 60 mg by mouth daily.    HYDROCORTISONE (PROCTOSOL HC) 2.5 % RECTAL CREAM    Apply as directed after each BM    MULTIPLE VITAMIN  (MULTIVITAMIN) TABLET    Take 1 tablet by mouth daily.     NIACIN 500 MG CR CAPSULE    Take 500 mg by mouth 2 (two) times daily.    OMEPRAZOLE (PRILOSEC) 20 MG CAPSULE    TAKE ONE CAPSULE BY MOUTH EVERY DAY   SERTRALINE (ZOLOFT) 25 MG TABLET    TAKE 1 TABLET (25 MG TOTAL) BY MOUTH DAILY.   SILDENAFIL (VIAGRA) 100 MG TABLET    Take as directed   TESTOSTERONE ENANTHATE (DELATESTRYL) 200 MG/ML INJECTION    Inject into the muscle every 14 (fourteen) days. For IM use only  Modified Medications   Modified Medication Previous Medication   ALPRAZOLAM (XANAX) 0.5 MG TABLET ALPRAZolam (XANAX) 0.5 MG tablet      TAKE 1 TABLET BY MOUTH THREE TIMES DAILYAS NEEDED FOR ANXIETY    TAKE 1 TABLET BY MOUTH THREE TIMES DAILYAS NEEDED FOR ANXIETY   TRAMADOL (ULTRAM) 50 MG TABLET traMADol (ULTRAM) 50 MG tablet      Take 1 tablet (50 mg total) by mouth every 8 (eight) hours as needed.    TAKE ONE TABLET BY MOUTH EVERY 8 HOURS AS NEEDED  Discontinued Medications   No medications on file

## 2016-05-25 ENCOUNTER — Other Ambulatory Visit: Payer: Self-pay | Admitting: Internal Medicine

## 2016-05-27 ENCOUNTER — Other Ambulatory Visit: Payer: Self-pay | Admitting: Pulmonary Disease

## 2016-05-27 NOTE — Telephone Encounter (Signed)
Pt calling requesting refill of Exforge 10/320/25mg .  [facility] [place], [place]

## 2016-06-07 ENCOUNTER — Telehealth: Payer: Self-pay | Admitting: Pulmonary Disease

## 2016-06-07 MED ORDER — DOXYCYCLINE HYCLATE 100 MG PO TABS: ORAL_TABLET | ORAL | 0 refills | Status: DC

## 2016-06-07 MED ORDER — BENZONATATE 200 MG PO CAPS: 200.0000 mg | ORAL_CAPSULE | Freq: Three times a day (TID) | ORAL | 0 refills | Status: DC | PRN

## 2016-06-07 NOTE — Telephone Encounter (Signed)
Offer doxycycline 100 mg, # 8, 2 today then one daily            Benzonatate perles 200 mg, # 30, 1 every 8 hours if needed for cough  Ok also to take Mucinex or MucinexDM  Stay well-hydrated, warm and rested

## 2016-06-07 NOTE — Telephone Encounter (Signed)
Called and spoke with pt and he stated that he has been coughing since Sunday.  Noticed today that this now has green sputum in it.  Pt is requesting that something be called to the pharmacy.  Pt stated that he has not been able to sleep due to coughing so much at night.  SN is out of the office this week, CY please advise. Thanks  Allergies  Allergen Reactions  . Zocor [Simvastatin] Other (See Comments)

## 2016-06-07 NOTE — Telephone Encounter (Signed)
Spoke with pt. And gave him CY recc. Pt. Agreed to the medication sent in. It was sent to his pharmacy of choice. He had no further questions. Nothing further is needed at this time.

## 2016-06-10 ENCOUNTER — Telehealth: Payer: Self-pay | Admitting: Pulmonary Disease

## 2016-06-10 NOTE — Telephone Encounter (Signed)
Nothing else to offer over the phone, suggest he go to UC or wait to be seen first of week as too late today to bring in

## 2016-06-10 NOTE — Telephone Encounter (Signed)
MW  Please Advise- Sick Message  This is a pt. Of SN. Pt. Called in and stated his cough has gotten worse, he feels sore in his chest from coughing so much. He is still having green sputum. Has been wheezing Denies increase sob. Below was CY recc. For the pt. When he called in 06/07/2016. He states he is still currently on the doxy.  Allergies  Allergen Reactions  . Zocor [Simvastatin] Other (See Comments)     Dakota Budgelinton D Young, MD  [facility]     06/07/16 5:29 PM  Note    Offer doxycycline 100 mg, # 8, 2 today then one daily            Benzonatate perles 200 mg, # 30, 1 every 8 hours if needed for cough  Ok also to take Mucinex or MucinexDM  Stay well

## 2016-06-10 NOTE — Telephone Encounter (Signed)
Spoke with pt and made him aware of MW recommendations. Pt states he will go to urgent care.  Nothing further needed

## 2016-06-15 ENCOUNTER — Telehealth: Payer: Self-pay | Admitting: Pulmonary Disease

## 2016-06-15 MED ORDER — LEVOFLOXACIN 500 MG PO TABS: 500.0000 mg | ORAL_TABLET | Freq: Every day | ORAL | 0 refills | Status: DC

## 2016-06-15 MED ORDER — PREDNISONE 20 MG PO TABS: ORAL_TABLET | ORAL | 0 refills | Status: DC

## 2016-06-15 NOTE — Telephone Encounter (Signed)
Pt's cough is not improved, prod cough with green mucus, some PND, headache, body aches.  Denies fever, sinus congestion.  Per 06/10/16 phone note, pt was instructed to go to UC as he had finished doxycycline called in with no improvement.  Pt states he did not go to UC, did not want to come in for an appt despite MW's recs. Pt requesting something be called in for him.  Pt uses [facility].    SN please advise on recs.  Thanks.

## 2016-06-15 NOTE — Telephone Encounter (Signed)
Per SN- Ok to send in levaquin 500mg  #7, 1 po qd with 0 refills Send in Prednisone 20mg  #12, 1 po bid X3 days, 1 po qd X3 days, 0.5 po qd until gone.  Spoke with pt, aware of recs.  rx's sent to preferred pharmacy.  Nothing further needed.

## 2016-07-30 ENCOUNTER — Telehealth: Payer: Self-pay | Admitting: Internal Medicine

## 2016-07-30 MED ORDER — DOXYCYCLINE HYCLATE 100 MG PO TABS: 100.0000 mg | ORAL_TABLET | Freq: Two times a day (BID) | ORAL | 0 refills | Status: DC

## 2016-07-30 MED ORDER — PREDNISONE 10 MG PO TABS: ORAL_TABLET | ORAL | 0 refills | Status: DC

## 2016-07-30 NOTE — Telephone Encounter (Signed)
Acute recurrent cough/ hoarseness / green mucus x 24h s sob/ rigors rec  Doxy pred x 6 Ov before more phone care if at all possible as this is the second flare since last ov that we are treating over the phone

## 2016-08-01 ENCOUNTER — Telehealth: Payer: Self-pay | Admitting: Cardiovascular Disease

## 2016-08-01 ENCOUNTER — Ambulatory Visit (INDEPENDENT_AMBULATORY_CARE_PROVIDER_SITE_OTHER): Payer: BLUE CROSS/BLUE SHIELD | Admitting: Internal Medicine

## 2016-08-01 ENCOUNTER — Encounter: Payer: Self-pay | Admitting: Internal Medicine

## 2016-08-01 VITALS — BP 128/80 | HR 94 | Ht 72.0 in | Wt 299.8 lb

## 2016-08-01 DIAGNOSIS — G4733 Obstructive sleep apnea (adult) (pediatric): Secondary | ICD-10-CM | POA: Diagnosis not present

## 2016-08-01 DIAGNOSIS — Z9989 Dependence on other enabling machines and devices: Secondary | ICD-10-CM

## 2016-08-01 NOTE — Progress Notes (Signed)
OSa 

## 2016-08-01 NOTE — Patient Instructions (Signed)
Order- [facility]- please increase current CPAP to 16. Continue mask of choice, humidifier, supplies, AirView. Please evaluate eligibility for replacement of old machine to autoPAP 10-20.    Dx OSA  Please call as needed

## 2016-08-01 NOTE — Progress Notes (Signed)
   Subjective:    Patient ID: Dakota Ellison, male    DOB: [birth year], 66 y.o.   MRN: [id number]  HPI  male Never smoker followed for OSA complicated by morbid obesity, HBP, asthma, GERD NPSG 2010, AHI 82/ hr CPAP to 20  -------------------------------------------------------------------------------------  07/27/2015-66 year old male followed for OSA complicated by morbid obesity, HBP, asthma, GERD NPSG 2010, AHI 82/ hr CPAP to 20 CPAP 14/ [facility] Former patient of [facility]; [facility] CPAP download confirms great compliance and control. He feels quality of life is improved with CPAP. No changes needed  08/02/2811-66 year old male never smoker followed for OSA, complicated by morbid obesity, HBP, asthma, GERD CPAP 14/[facility]> increase to 16 today or replace old machine auto 10-20 FOLLOWS FOR: DME: [facility] Pt wears CPAP nightly and DL attached. Pt has been hoarse and currently Doxy and Prednisone. Weight today 299 pounds Download shows good compliance residual AHI 11  Review of Systems  Constitutional: Negative for fever and unexpected weight change.  HENT: Negative for congestion, dental problem, ear pain, nosebleeds, postnasal drip, rhinorrhea, sinus pressure, sneezing, sore throat and trouble swallowing.   Eyes: Negative for redness and itching.  Respiratory: Negative for cough, chest tightness, shortness of breath and wheezing.   Cardiovascular: Negative for palpitations and leg swelling.  Gastrointestinal: Negative for nausea and vomiting.  Genitourinary: Negative for dysuria.  Musculoskeletal: Negative for joint swelling.  Skin: Negative for rash.  Neurological: Negative for headaches.  Hematological: Does not bruise/bleed easily.  Psychiatric/Behavioral: Negative for dysphoric mood. The patient is not nervous/anxious.      Objective:   OBJ- Physical Exam      + Baseline exam today General- Alert, Oriented, Affect-appropriate, Distress- none acute, +  obese/big man Skin- rash-none, lesions- none, excoriation- none Lymphadenopathy- none Head- atraumatic            Eyes- Gross vision intact, PERRLA, conjunctivae and secretions clear            Ears- Hearing, canals-normal            Nose- Clear, no-Septal dev, mucus, polyps, erosion, perforation             Throat- Mallampati III , mucosa clear , drainage- none, tonsils- atrophic Neck- flexible , trachea midline, no stridor , thyroid nl, carotid no bruit Chest - symmetrical excursion , unlabored           Heart/CV- RRR , no murmur , no gallop  , no rub, nl s1 s2                           - JVD- none , edema- none, stasis changes- none, varices- none           Lung- clear to P&A, wheeze- none, cough- none , dullness-none, rub- none           Chest wall-  Abd-  Br/ Gen/ Rectal- Not done, not indicated Extrem- cyanosis- none, clubbing, none, atrophy- none, strength- nl Neuro- grossly intact to observation    Assessment & Plan:

## 2016-08-01 NOTE — Telephone Encounter (Signed)
Pt calling stating a couple years ago he's had some really bad leg pains.  He is having those pains again and he would like to change up his Atorvastatin   He does not want to go back on simvastatin states he's been on that in past and it was causing the pains in the first placed.  Please advise

## 2016-08-01 NOTE — Telephone Encounter (Signed)
Pt is currently taking Lipitor 10 mg.  Pt is certain this is causing leg cramps and he would like an alternate med sent in, but not simvastatin.   10/08/15 labs show TC 138, Trig 187.

## 2016-08-02 NOTE — Telephone Encounter (Signed)
Alternative would be to stop the Lipitor Start Crestor 10 mg daily

## 2016-08-03 NOTE — Telephone Encounter (Signed)
Left voicemail message for patient to call back.

## 2016-08-03 NOTE — Assessment & Plan Note (Signed)
We would like better control. His machine is getting old and if he is eligible, I would like to change him to AutoPap 10-20. We are contacting DME for this.

## 2016-08-04 MED ORDER — ROSUVASTATIN CALCIUM 10 MG PO TABS: 10.0000 mg | ORAL_TABLET | Freq: Every day | ORAL | 3 refills | Status: DC

## 2016-08-04 NOTE — Telephone Encounter (Signed)
Patient returning my call from yesterday and reviewed his symptoms and recommendations. He was agreeable with starting the crestor 10 mg once daily and had no further questions at this time. Sent in prescription to his pharmacy of choice and let him know to call back if he should have any further problems.

## 2016-08-22 ENCOUNTER — Other Ambulatory Visit: Payer: Self-pay | Admitting: Internal Medicine

## 2016-08-23 ENCOUNTER — Other Ambulatory Visit: Payer: Self-pay | Admitting: Pulmonary Disease

## 2016-09-01 ENCOUNTER — Telehealth: Payer: Self-pay | Admitting: Pulmonary Disease

## 2016-09-01 NOTE — Telephone Encounter (Signed)
lmtcb for pt.  

## 2016-09-01 NOTE — Telephone Encounter (Signed)
Error.Stanley A Dalton ° °

## 2016-09-05 MED ORDER — SERTRALINE HCL 25 MG PO TABS: ORAL_TABLET | ORAL | 0 refills | Status: DC

## 2016-09-05 NOTE — Telephone Encounter (Signed)
Pr SN: Okay to refill same Rx as below.  This has been called into [facility].   LM x 1 for patient to return call

## 2016-09-05 NOTE — Telephone Encounter (Signed)
Pt requesting refill of his Sertraline  Last filled 12/14/15, #90 x 2 refills.  Last seen 04/11/16 with SN Upcoming appt 10/10/16 with SN Please advise Dr Kriste Basque if okay to refill today. Thanks.

## 2016-09-06 NOTE — Telephone Encounter (Signed)
Pt aware of presc will pick up

## 2016-09-06 NOTE — Telephone Encounter (Signed)
Called and NA and now VM is full

## 2016-09-29 DIAGNOSIS — G4733 Obstructive sleep apnea (adult) (pediatric): Secondary | ICD-10-CM | POA: Diagnosis not present

## 2016-10-10 ENCOUNTER — Ambulatory Visit (INDEPENDENT_AMBULATORY_CARE_PROVIDER_SITE_OTHER): Payer: PPO | Admitting: Pulmonary Disease

## 2016-10-10 ENCOUNTER — Encounter: Payer: Self-pay | Admitting: Pulmonary Disease

## 2016-10-10 ENCOUNTER — Telehealth: Payer: Self-pay | Admitting: Pulmonary Disease

## 2016-10-10 ENCOUNTER — Other Ambulatory Visit (INDEPENDENT_AMBULATORY_CARE_PROVIDER_SITE_OTHER): Payer: PPO

## 2016-10-10 VITALS — BP 118/76 | HR 83 | Temp 98.2°F | Ht 72.0 in | Wt 290.1 lb

## 2016-10-10 DIAGNOSIS — E559 Vitamin D deficiency, unspecified: Secondary | ICD-10-CM

## 2016-10-10 DIAGNOSIS — E349 Endocrine disorder, unspecified: Secondary | ICD-10-CM

## 2016-10-10 DIAGNOSIS — E782 Mixed hyperlipidemia: Secondary | ICD-10-CM

## 2016-10-10 DIAGNOSIS — R7989 Other specified abnormal findings of blood chemistry: Secondary | ICD-10-CM | POA: Diagnosis not present

## 2016-10-10 DIAGNOSIS — Z9989 Dependence on other enabling machines and devices: Secondary | ICD-10-CM | POA: Diagnosis not present

## 2016-10-10 DIAGNOSIS — M150 Primary generalized (osteo)arthritis: Secondary | ICD-10-CM | POA: Diagnosis not present

## 2016-10-10 DIAGNOSIS — G4733 Obstructive sleep apnea (adult) (pediatric): Secondary | ICD-10-CM

## 2016-10-10 DIAGNOSIS — I10 Essential (primary) hypertension: Secondary | ICD-10-CM | POA: Diagnosis not present

## 2016-10-10 DIAGNOSIS — M159 Polyosteoarthritis, unspecified: Secondary | ICD-10-CM

## 2016-10-10 DIAGNOSIS — R7302 Impaired glucose tolerance (oral): Secondary | ICD-10-CM

## 2016-10-10 LAB — CBC WITH DIFFERENTIAL/PLATELET
BASOS ABS: 0.1 10*3/uL (ref 0.0–0.1)
BASOS PCT: 0.8 % (ref 0.0–3.0)
EOS PCT: 1.6 % (ref 0.0–5.0)
Eosinophils Absolute: 0.2 10*3/uL (ref 0.0–0.7)
HCT: 50.8 % (ref 39.0–52.0)
Hemoglobin: 17.3 g/dL — ABNORMAL HIGH (ref 13.0–17.0)
LYMPHS ABS: 1.9 10*3/uL (ref 0.7–4.0)
Lymphocytes Relative: 16.5 % (ref 12.0–46.0)
MCHC: 34 g/dL (ref 30.0–36.0)
MCV: 86.2 fl (ref 78.0–100.0)
MONOS PCT: 7.9 % (ref 3.0–12.0)
Monocytes Absolute: 0.9 10*3/uL (ref 0.1–1.0)
NEUTROS ABS: 8.2 10*3/uL — AB (ref 1.4–7.7)
NEUTROS PCT: 73.2 % (ref 43.0–77.0)
PLATELETS: 274 10*3/uL (ref 150.0–400.0)
RBC: 5.9 Mil/uL — ABNORMAL HIGH (ref 4.22–5.81)
RDW: 13.9 % (ref 11.5–15.5)
WBC: 11.2 10*3/uL — ABNORMAL HIGH (ref 4.0–10.5)

## 2016-10-10 LAB — LDL CHOLESTEROL, DIRECT: Direct LDL: 74 mg/dL

## 2016-10-10 LAB — COMPREHENSIVE METABOLIC PANEL
ALK PHOS: 68 U/L (ref 39–117)
ALT: 36 U/L (ref 0–53)
AST: 29 U/L (ref 0–37)
Albumin: 4.8 g/dL (ref 3.5–5.2)
BILIRUBIN TOTAL: 1 mg/dL (ref 0.2–1.2)
BUN: 17 mg/dL (ref 6–23)
CALCIUM: 10.2 mg/dL (ref 8.4–10.5)
CO2: 29 meq/L (ref 19–32)
Chloride: 101 mEq/L (ref 96–112)
Creatinine, Ser: 1.05 mg/dL (ref 0.40–1.50)
GFR: 75.33 mL/min (ref >60.00)
Glucose, Bld: 100 mg/dL — ABNORMAL HIGH (ref 70–99)
POTASSIUM: 4.3 meq/L (ref 3.5–5.1)
Sodium: 140 mEq/L (ref 135–145)
Total Protein: 7.4 g/dL (ref 6.0–8.3)

## 2016-10-10 LAB — LIPID PANEL
Cholesterol: 142 mg/dL (ref 0–200)
HDL: 43.8 mg/dL (ref >39.00)
NONHDL: 98.29
TRIGLYCERIDES: 219 mg/dL — AB (ref 0.0–149.0)
Total CHOL/HDL Ratio: 3
VLDL: 43.8 mg/dL — AB (ref 0.0–40.0)

## 2016-10-10 LAB — TSH: TSH: 1.32 u[IU]/mL (ref 0.35–4.50)

## 2016-10-10 LAB — PSA: PSA: 1.53 ng/mL (ref 0.10–4.00)

## 2016-10-10 LAB — VITAMIN D 25 HYDROXY (VIT D DEFICIENCY, FRACTURES): VITD: 43.29 ng/mL (ref 30.00–100.00)

## 2016-10-10 MED ORDER — ALPRAZOLAM 0.5 MG PO TABS: ORAL_TABLET | ORAL | 5 refills | Status: DC

## 2016-10-10 NOTE — Progress Notes (Signed)
Subjective:    Patient ID: Dakota Ellison, male    DOB: May 02, 1951, 66 y.o.   MRN: [id number]  HPI 66 y/o WM here for an add-on visit... he has multiple medical problems as noted below...   ~  SEE PREV EPIC NOTES FOR OLDER DATA >>    LABS 4/13:  FLP- at goals on Simva40;  Chems- ok w/ BS=106;  CBC- wnl;  TSH=2.20;  PSA=1.44  LABS 10/13:  Chems- ok w/ BS106...   ~  October 01, 2012:  59mo ROV & [name] has an appt coming up w/ DrGerkin, [facility] to see about his inguinal hernias R>L; he is also interested in discussing poss Bariatric Surg;  He notes that his weight is about the same but he has been unable to lose;  Resting well w/ CPAP from [facility], BP remains controlled on hisExforgeHCT, Chol has been good on the Simva40, etc...     We reviewed prob list, meds, xrays and labs> see below for updates >>   LABS 4/14:  FLP- ok x TG=167 on Simva40 & needs better low fat diet & wt reduction;  Chems- wnl w/ BS=94 & A1c=5.9;  CBC- wnl;  TSH=2.39;  PSA=1.30...  ~  January 29, 2013:  43mo ROV & acute visit request add-on for puritic rash> states it started several weeks ago, red sl excoriated rash on legs/ feet/ ankles (including betw the toes), groin, one on penis, buttock, back, chest; very pruritic- esp at night & discrete macules/ papules are all c/w scabies...  Discussed w/ pt & rec treatment w/ KWELL lotion (wash, dry, apply for 12h & wash off, repeat process in 1wk if not completely resolved; also wrote for Atarax25mg  prn itching....    He saw DrGerkin 5/14> he felt the right inguinal hernia but could not find a recurrent  left sided hernia on exam (prev [facility] repair 1998); they did CT Pelvis- showed RIH containing fat & prev LIH repair w/o recurrence, min calcif atherosclerotic changes noted...     Riley saw [facility] 6/14 for colonoscopy> 2 sm sigmoid polyps removed, int hems, & we don't have path reports; pt told to f/u in 5-33yrs...    He had a f/u visit w/ DrDahlstedt 6/14> hypogonadism on testos shots  200mg /ml- getting 1ml every other week & Viagra prn; they checked UA, CBC, Prolactin, Testos level & PSA- we do not have their lab data... We reviewed prob list, meds, xrays and labs> see below for updates >>   ~  April 01, 2013:  73mo ROV & [name] requests Rx for new CPAP machine- his is broken- but studies & prev Rx per [facility] and we discussed this    OSA> he had an AHI=82 in 2010 & started on CPAP20 by Howell Rucks, [facility] & he continues follow up there (we do not have notes)...    Hx mild asthma> he still smokes an occas cigar & asked to quit completely!!! He states no attacks in yrs & breathing is OK, not on meds- denies CP, palpit, SOB, edema...    HBP> on ExforgeHCT & K+; BP today= 134/80 & similar at work; he remains asymptomatic...    Hyperlipid> on Simva40 + Niaspan; last FLP 4/14 looked ok; he understands that improvement here requires him to lose weight!    Obesity> wt= 307# which is up 7# over the interval; we reviewed diet/ exercise/ wt reduction program...    GI> GERD, Rectal fissure> on Prilosec20 & AnusolHC as needed; he saw [facility] 6/14 for colonoscopy>  2 sm sigmoid polyps removed, int hems, & we don't have path reports; pt told to f/u in 5-2441yrs    GU> Low-T & ED>  He takes a bunch of vits & herbs including chromium, cinnamon, vinegar, MVI, etc; DrDahlstedt treats him w/ Testos shots & Viagra.    DJD> he uses OTC analgesics, Mobic15, and Vicodin as needed...    Depression> he remains on Zoloft25 & Xanax0.5 as needed... We reviewed prob list, meds, xrays and labs> see below for updates >> given 2014 Flu vaccine; he requests refill for his Vicodin & Xanax...  LABS 10/14:  Chems- ok x BS=113, A1c=6.0 (OK on diet alone)...   Art Dopplers of LEs done 02/27/14 were WNL- and walk test was neg w/o claudication...  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    Hx mild asthma> he still smokes an occas cigar & asked to quit completely!!! He states no attacks in yrs & breathing is OK, not on meds- denies CP, palpit, SOB, edema...    HBP> on ExforgeHCT (10-320-25); BP today= 130/78 & he remains asymptomatic w/o CP, palpit, ch in SOB, edema, etc...    Hyperlipid> on Atorva10 + Niaspan; FLP 5/17 showed TChol 138, TG 187, HDL 39, LDL 61; needs better low fat diet & weight loss!    Obesity> wt= 305# which is stable over the yrs- unable to lose wt effectively; we reviewed diet/ exercise/ wt reduction program...    GI> GERD, Rectal fissure> on Prilosec20 & AnusolHC as needed; [facility] 2 sm sigmoid polyps removed, int hems, & we don't have path reports; pt told to f/u in 5-2141yrs    GU> Low-T & ED>  He takes a bunch of vits & herbs including garlic, cinnamon, vinegar, ginko, MVI, etc; DrDahlstedt treats him w/ Testos shots & Viagra.    DJD> he uses OTC analgesics, Tramadol50, and Vicodin as needed; needs TKRs but he's holding off, needs to lose wt...    Depression> he remains on Zoloft25 & Xanax0.5 as needed... EXAM shows Afeb, VSS, O2sat=96% on RA;  Wt=305# 6'Tall, BMI=41-2;  HEENT- parotid hypertrophy, mallampati2, no bruits;  Chest- clear w/o w/r/r;  Heart- RR w/o m/r/g;  Abd- obese, soft, sl panniculus; Ext- DJD knees, w/o c/c/e...  CXR 10/08/15>  Norm heart size, clear lungs, NAD...  LABS 10/08/15>  FLP- at goals on Lip10 but elev TG & needs better diet/ exerc/ get wt down;  Chems- ok w/ BS=95, A1c=6.2;  CBC- wnl;  TSH=1.59;  PSA=1.10;  UA- clear IMP/PLAN>>  [name] returns for a CPX &  med follow-up;  Doing reasonably well but same old story- must get on diet & get wt down (reviewed w/ pt);  Due for pneumonia vaccines- we gave PREVNAR-13 today;  Rx written for shingles vaccine;  Refills written for 90d supplies of Alpraz and Tramadol- continue same meds, get on diet, get wt down...   ~  April 11, 2016:  92mo ROV w/ SN>  Adolph returns and notes recent dental problems w/ infection & root canal;  He has been seeing ORTHO for his knee, getting shots, but told he needs TKR;  He has also been seeing NS-DrRoy for back pain but we do not have notes from these specialists to review...     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     OSA> he had an AHI=82 in 2010 & started on CPAP20 by Howell Rucks, [facility] & he has had sleep f/u by DrClance=> DrYoung seen 07/27/15 and stable.    Hx mild asthma> he still smokes an occas cigar & asked to quit completely!!! He states no attacks in yrs & breathing is OK, not on meds- denies CP, palpit, SOB, edema...    HBP> on ExforgeHCT (10-320-25); BP today= 128/80 & he remains asymptomatic w/o CP, palpit, ch in SOB, edema, etc...    Hyperlipid> on Atorva10 + Niaspan; FLP 5/17 showed TChol 138, TG 187, HDL 39, LDL 61; needs better low fat diet & weight loss!    Obesity> wt= 297# which is down8#- prev unable to lose wt effectively; we reviewed diet/ exercise/ wt reduction program...    GI> GERD, Rectal fissure> on Prilosec20 & AnusolHC as needed; he saw [facility] 6/14 for colonoscopy> 2 sm sigmoid polyps removed, int hems, & we don't have path reports; pt told to f/u in 5-63yrs    GU> Low-T & ED>  He takes a bunch of vits & herbs including garlic, cinnamon, vinegar, ginko, MVI, etc; DrDahlstedt treats him w/ Testos shots & Viagra.    DJD> he uses OTC analgesics, Tramadol50, and Vicodin as needed; needs TKRs but he's holding off, needs  to lose wt...    Depression> he remains on Zoloft25 & Xanax0.5 as needed... EXAM shows Afeb, VSS, O2sat=96% on RA;  Wt=305# 6'Tall, BMI=41-2;  HEENT- parotid hypertrophy, mallampati2, no bruits;  Chest- clear w/o w/r/r;  Heart- RR w/o m/r/g;  Abd- obese, soft, sl panniculus; Ext- DJD knees, w/o c/c/e...  EKG 03/28/16 by Stormy Card showed NSR, rate97, wnl- NAD... IMP/PLAN>>  Julius is overall stable w/ dental issues + Ortho (knee) and Neuro (back pain);  meds refilled and he will continue to f/u w/ the specalists and aske for notes to be sent her for epic... NOTE: >50% of this rov was spent in counseling 7 coordination of care...   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  We reviewed the following medical problems during today's office visit >>     OSA> he had an AHI=82 in 2010 & started on CPAP20 by Howell Rucks, [facility] & he has had sleep f/u by DrClance=> DrYoung seen 07/2016 and stable.    Hx mild asthma> he still smokes an occas cigar & asked to quit completely!!! He states no  attacks in yrs & breathing is OK, not on meds- denies CP, palpit, SOB, edema...    HBP> on ExforgeHCT (10-320-25); BP today= 118/76 & he remains asymptomatic w/o CP, palpit, ch in SOB, edema, etc...    Hyperlipid> on Crestor10 + Niaspan; FLP 5/18 showed TChol 142, TG 219, HDL 44, LDL 74; needs better low fat diet & weight loss!    Obesity> wt= 290# which is down7#- prev unable to lose wt effectively; we reviewed diet/ exercise/ wt reduction program...     GI> GERD, Rectal fissure> on Prilosec20 & AnusolHC as needed; he saw [facility] 6/14 for colonoscopy> 2 sm sigmoid polyps removed, int hems, & we don't have path reports; pt told to f/u in 5-35yrs    GU> Low-T & ED>  He takes a bunch of vits & herbs including garlic, cinnamon, vinegar, ginko, MVI, etc; DrDahlstedt treats him w/ Testos shots (200 Q2wks) & Viagra prn; PSAs and Hg have been wnl...    DJD> he uses OTC analgesics, Tramadol50=> now Vicodin prn per [facility]; needs TKRs but he's holding off, needs to lose wt...    Depression> he remains on Zoloft25 & Xanax0.5 as needed... EXAM shows Afeb, VSS, O2sat=95% on RA;  Wt=290# 6'Tall, BMI=39-40;  HEENT- parotid hypertrophy, mallampati2, no bruits;  Chest- clear w/o w/r/r;  Heart- RR w/o m/r/g;  Abd- obese, soft, sl panniculus; Ext- DJD knees, w/o c/c/e...  LABS 10/10/16>  Chol is ok on Cres10 but TG elev at 219- needs better diet;  Chems- wnl;  CBC- wnl (Hg=17.3);  TSH=1.32;  PSA=1.53;  VitD=43... IMP/PLAN>>  [place] is stable w/ his mult medical issues- continue CPAP, same meds, get on diet w/ wt reduction;  DrRoy prescribes Vicodin for pain & pt is cautioned;  DrDahstedt prescribes Testos shots & checks his PSAs and Hg... Continue samemeds for now...          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 it got worse and was sent to DrWolicki who hospitalized him 3/08 w/ MRSA infection- Rx's w/ Vancomycin infusions... now resolved.  OBSTRUCTIVE SLEEP APNEA (ICD-327.23) - Apr10> had Sleep Study at [facility]> pos for OSA w/ AHI= 82, desat to 85%... split night study w/ good control on CPAP 20 (see report)... started on CPAP by [facility] & still followed by him...  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 no recent exac and no regular meds... told to discontinue all smoking> denies much cough, sputum, dyspnea, CP, etc... ~  CXR 2/09 showed  normal heart size, clear lungs, degen changes in Tspine, NAD...   HYPERTENSION (ICD-401.9) - followed by DrGollan/ [facility]> on ECASA 325mg /d & EXFORGE 10/320/25 daily + KCl 51mEq/d... he had CP while hosp w/ the MRSA 2008 and DrWolicki consulted DrMcQueen who did a NuclearStressTest 3/08 which was normal... and a 2DEcho which showed mild MR and nl LVF... ~ 10/12:  f/u visit DrGollan at [facility]> note reviewed, no changes made, DrGollan desperately wants him to lose weight! ~  10/12:  BP= 124/76 doing well, remains asymptomatic... ~  4/13:  BP= 120/78 & he denies CP, palpit, SOB, edema, etc... ~  10/13:  BP= 118/76 & he remains asymptomatic; EKG showed NSR, rate86, wnl, NAD... ~  4/14:  BP= 134/80 & he denies CP, palpit, dizzy, SOB, edema... ~  8/14:  BP= 120/82 & he remains asymptomatic...  HYPERLIPIDEMIA (ICD-272.4) - he's been  switched around by [facility] betw Simva/ Caduet/ back to SIMVASTATIN 40mg /d now + NIACIN 500mg Bid + FISH OIL 1000mg Bid...  still not on any kind of diet but his weight is stable at 294#.Marland Kitchen. ~  FLP 1/08 showed TChol 195, TG 119, HDL 41, LDL 131 ~  FLP 2/09 showed TChol 214, TG 214, HDL 39, LDL 127 ~  12/09: recent labs by [facility]- we don't have these results... ~  FLP 5/10 on 3 meds above showed TChol 148, TG 81, HDL 48, LDL 84 ~  FLP 11/10 on 3 meds showed TChol 135, TG 145, HDL 36, LDL 70 ~  FLP 5/11 showed TChol 145, TG 128, HDL 43, LDL 77... keep same Rx. ~  FLP 10/11 showed TChol 152, TG 198, HDL 41, LDL 72... same meds, better diet, get wt down! ~  FLP 4/12 showed TChol 141, TG 200, HDL 39, LDL 62... Similar> needs better low fat diet, get wt down. ~  10/12:  FLP not rechecked today as he has not lost wt etc... ~  FLP 4/13 on Simva40+Niasp1000 showed TChol 137, TG 124, HDL 43, LDL 69 ~  FLP 4/14 on Simva40+Niasp1000 showed TChol 128, TG 167, HDL 35, LDL 59  MORBID OBESITY (ICD-278.01) - despite all efforts weight remains >300#... discussed wt watchers,  nutrition center, poss bariatric approach... he's not been able to exercise but will join the Y & start diet program! ~  5/11:  he reports that DrElkins gave him 30d supply of Phenteramine which really helped- lost 20#, but put it all back + 9# more off this med... we discussed the dangers of diet pills & rec diet + exercise, neither of which he has tried in earnest! ~  10/11:  weight up 15# to 314# today> discussed diet, exercise, etc... ~  4/12:  Weight = 309# and needs to do better w/ diet/ exercise... ~  10/12:  Weight = 314# and we reviewed diet, exercise, wt loss program... ~  4/13:  Weight = 311# ~  10/13:  Weight = 322# ~  4/14:  Weight = 320# ~  8/14:  Weight = 307#  GERD (ICD-530.81) - he had an EGD from [facility] 4/03 which was neg... he takes OMEPRAZOLE 20mg /d for reflux symptoms which works well...  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 prev tried Diltiazem cream, now ANUSOL HC etc... COLON POLYPS >>  ~  pt reports colonoscopy by [facility] 7/03- reported neg & f/u planned 5 yrs> he is overdue & will call for f/u exam... ~  Auden saw [facility] 6/14 for colonoscopy> 2 sm sigmoid polyps removed, int hems, & we don't have path reports; pt told to f/u in 5-64yrs...  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 also takes Viagra Prn...  ~  labs 5/10 showed PSA = 0.85 ~  labs 5/11 showed PSA= 0.65 ~  Labs 4/12 showed PSA= 0.79 ~  Labs 4/13 showed PSA= 1.44 ~  9/13: seen by DrDahlstedt> treated for hypogonadism w/ Testopel, then Testos shots but he was irregular w/ his visits; prostate exam was wnl; they tried to teach him self injected Testos Q2weeks...  ~  Labs 4/14 showed PSA= 1.30 ~  He had a f/u visit w/ DrDahlstedt 6/14>  hypogonadism on testos shots 200mg /ml- getting  1ml every other week & Viagra prn; they checked UA, CBC, Prolactin, Testos level & PSA- we do not have their lab data.   OSTEOARTHRITIS (ICD-715.90) DEGENERATIVE DISC DISEASE (ICD-722.6) - followed by DrMurphy for right knee pain... prev seen by Mayford Knife for DDD and LBP... ~  s/p left CTS surg 2009 ~  s/p right knee arthroscopy by DrMurphy 2/10... also had cortisone shot in knee 4/10 ~  s/p trigger finger injections by DrGramig ~  4/12:  s/p left knee arthroscopy by DrMurphy> improved...  DYSTHYMIA (ICD-300.4) - some stress, some depression which is mild by his determination... he tried Lexapro last year but didn't follow up... now using Xanax as needed, and started ZOLOFT 25mg /d in Feb10- he reports better, wants to continue this dose.  HEALTH MAINTENANCE: ~  GI:  He saw [facility] 6/14 for f/u colonoscopy w/ 2 polyps removed- we don't have path. ~  GU:  Followed by DrDahlstedt as above... ~  Immuniz:  He gets Flu shot each yr at work; he had TDAP at work 2013...   Past Surgical History  Procedure Date  . Sphincterotomy   . Shoulder arthroscopy     right  . Other surgical history     arthroscopic subacromial decompression  . Other surgical history     open resection, distal right clavicle  . Knee surgery 09/2010    left knee    =>  s/p left knee arthroscopy 4/12 by DrMurphy      Outpatient Encounter Prescriptions as of 10/10/2016  Medication Sig  . Amlodipine-Valsartan-HCTZ 10-320-25 MG TABS TAKE 1 TABLET BY MOUTH DAILY. YEARLY PHYSICAL W/LABS IS DUE MUST SEE MD FOR REFILLS  . aspirin 81 MG tablet Take 81 mg by mouth daily.    . Cinnamon 500 MG capsule Take 2,000 mg by mouth daily.   . Garlic 100 MG TABS Take 1 tablet by mouth 2 (two) times daily.  . Ginkgo Biloba 100 MG CAPS Take 60 mg by mouth daily.   Marland Kitchen HYDROcodone-acetaminophen (NORCO/VICODIN) 5-325 MG tablet Take 1 tablet by mouth every 6 (six) hours as needed for moderate  pain. Given by Dr. Channing Mutters  . hydrocortisone (PROCTOSOL HC) 2.5 % rectal cream Apply as directed after each BM   . Multiple Vitamin (MULTIVITAMIN) tablet Take 1 tablet by mouth daily.    . niacin 500 MG CR capsule Take 500 mg by mouth 2 (two) times daily.   Marland Kitchen omeprazole (PRILOSEC) 20 MG capsule TAKE ONE CAPSULE BY MOUTH EVERY DAY  . rosuvastatin (CRESTOR) 10 MG tablet Take 1 tablet (10 mg total) by mouth daily.  . sertraline (ZOLOFT) 25 MG tablet TAKE 1 TABLET (25 MG TOTAL) BY MOUTH DAILY.  . sildenafil (VIAGRA) 100 MG tablet Take as directed  . testosterone enanthate (DELATESTRYL) 200 MG/ML injection Inject into the muscle every 14 (fourteen) days. For IM use only  . [DISCONTINUED] ALPRAZolam (XANAX) 0.5 MG tablet TAKE 1 TABLET BY MOUTH THREE TIMES DAILYAS NEEDED FOR ANXIETY  . [DISCONTINUED] ALPRAZolam (XANAX) 0.5 MG tablet TAKE 1 TABLET BY MOUTH THREE TIMES DAILYAS NEEDED FOR ANXIETY  . [DISCONTINUED] doxycycline (VIBRA-TABS) 100 MG tablet Take 1 tablet (100 mg total) by mouth 2 (two) times daily. (Patient not taking: Reported on 10/10/2016)  . [DISCONTINUED] levofloxacin (LEVAQUIN) 500 MG tablet Take 1 tablet (500 mg total) by mouth daily. (Patient not taking: Reported on 10/10/2016)  . [DISCONTINUED] predniSONE (DELTASONE) 10 MG tablet Take  4 each am x 2 days,   2 each am x  2 days,  1 each am x 2 days and stop (Patient not taking: Reported on 10/10/2016)  . [DISCONTINUED] traMADol (ULTRAM) 50 MG tablet Take 1 tablet (50 mg total) by mouth every 8 (eight) hours as needed. (Patient not taking: Reported on 10/10/2016)   No facility-administered encounter medications on file as of 10/10/2016.     Allergies  Allergen Reactions  . Zocor [Simvastatin] Other (See Comments)    Muscle weakness in his legs  . Atorvastatin     Muscle weakness in his legs    Immunization History  Administered Date(s) Administered  . Influenza Split 03/25/2011, 03/23/2012, 03/18/2013  . Influenza Whole 03/06/2009, 03/08/2010   . Influenza,inj,Quad PF,36+ Mos 03/06/2014, 03/28/2016  . Influenza-Unspecified 03/21/2015  . Pneumococcal Conjugate-13 10/08/2015  . Tdap 03/07/2011  . Zoster 10/23/2015    Current Medications, Allergies, Past Medical History, Past Surgical History, Family History, and Social History were reviewed in [employer] record.    Review of Systems         See HPI - all other systems neg except as noted... The patient complains of dyspnea on exertion.  The patient denies anorexia, fever, weight loss, weight gain, vision loss, decreased hearing, hoarseness, chest pain, syncope, peripheral edema, prolonged cough, headaches, hemoptysis, abdominal pain, melena, hematochezia, severe indigestion/heartburn, hematuria, incontinence, muscle weakness, suspicious skin lesions, transient blindness, difficulty walking, depression, unusual weight change, abnormal bleeding, enlarged lymph nodes, and angioedema.     Objective:   Physical Exam     WD, Obese, 66 y/o WM in NAD... GENERAL:  Alert & oriented; pleasant & cooperative. HEENT:  [place]/AT, EOM-wnl, PERRLA, EACs-clear, TMs-wnl, NOSE-clear, THROAT-clear & wnl. NECK:  Supple w/ fairROM; no JVD; normal carotid impulses w/o bruits; no thyromegaly or nodules palpated; no lymphadenopathy. CHEST:  Clear to P & A; without wheezes/ rales/ or rhonchi. HEART:  Regular Rhythm; gr 1/6 SEM, without rubs or gallops... ABDOMEN:  Obese soft & nontender; normal bowel sounds; no organomegaly or masses detected.  EXT: without deformities, mild arthritic changes; no varicose veins/ +venous insuffic/ no edema.right knee w/ ace wrap.  NEURO:  CN's intact; no focal neuro deficits... DERM: diffuse sl excoriated papular rash on legs, ankles, feet, groin, buttocks, back, chest> c/w scabies...  RADIOLOGY DATA:  Reviewed in the EPIC EMR & discussed w/ the patient...  LABORATORY DATA:  Reviewed in the EPIC EMR & discussed w/ the patient...   Assessment &  Plan:    57/18>   Jerri is stable w/ his mult medical issues- continue CPAP, same meds, get on diet w/ wt reduction;  DrRoy prescribes Vicodin for pain & pt is cautioned;  DrDahstedt prescribes Testos shots & checks his PSAs and Hg... Continue samemeds for now.   OSA>  On CPAP per DrYoung now & stable, good compliance, etc...  Asthma>  He has decr cigars but still needs to quit, we discussed smoking cessation but he doesn't want med help etc...  HBP>  Controlled on the ExforgeHCT Rx, he likes the one pill;  discussed the need to lose wt, no salt, etc...  Hyperlipidemia>  FLPs reviewed> if he wants to improve these numbers MUST get on diet & get wt down!!!  OBESITY>  As above!!!  GU>  Per DrDahlstedt on Testos shots & Viagra...  DJD>  S/p arthroscopic surg left knee by DrMurphy but told he needs TKR & holding off...   Patient's Medications  New Prescriptions   No medications on file  Previous Medications   AMLODIPINE-VALSARTAN-HCTZ 10-320-25  MG TABS    TAKE 1 TABLET BY MOUTH DAILY. YEARLY PHYSICAL W/LABS IS DUE MUST SEE MD FOR REFILLS   ASPIRIN 81 MG TABLET    Take 81 mg by mouth daily.     CINNAMON 500 MG CAPSULE    Take 2,000 mg by mouth daily.    GARLIC 100 MG TABS    Take 1 tablet by mouth 2 (two) times daily.   GINKGO BILOBA 100 MG CAPS    Take 60 mg by mouth daily.    HYDROCODONE-ACETAMINOPHEN (NORCO/VICODIN) 5-325 MG TABLET    Take 1 tablet by mouth every 6 (six) hours as needed for moderate pain. Given by Dr. Channing Mutters   HYDROCORTISONE (PROCTOSOL [facility]) 2.5 % RECTAL CREAM    Apply as directed after each BM    MULTIPLE VITAMIN (MULTIVITAMIN) TABLET    Take 1 tablet by mouth daily.     NIACIN 500 MG CR CAPSULE    Take 500 mg by mouth 2 (two) times daily.    OMEPRAZOLE (PRILOSEC) 20 MG CAPSULE    TAKE ONE CAPSULE BY MOUTH EVERY DAY   ROSUVASTATIN (CRESTOR) 10 MG TABLET    Take 1 tablet (10 mg total) by mouth daily.   SERTRALINE (ZOLOFT) 25 MG TABLET    TAKE 1 TABLET (25 MG TOTAL) BY  MOUTH DAILY.   SILDENAFIL (VIAGRA) 100 MG TABLET    Take as directed   TESTOSTERONE ENANTHATE (DELATESTRYL) 200 MG/ML INJECTION    Inject into the muscle every 14 (fourteen) days. For IM use only  Modified Medications   Modified Medication Previous Medication   ALPRAZOLAM (XANAX) 0.5 MG TABLET ALPRAZolam (XANAX) 0.5 MG tablet      TAKE 1 TABLET BY MOUTH THREE TIMES DAILYAS NEEDED FOR ANXIETY    TAKE 1 TABLET BY MOUTH THREE TIMES DAILYAS NEEDED FOR ANXIETY  Discontinued Medications   DOXYCYCLINE (VIBRA-TABS) 100 MG TABLET    Take 1 tablet (100 mg total) by mouth 2 (two) times daily.   LEVOFLOXACIN (LEVAQUIN) 500 MG TABLET    Take 1 tablet (500 mg total) by mouth daily.   PREDNISONE (DELTASONE) 10 MG TABLET    Take  4 each am x 2 days,   2 each am x 2 days,  1 each am x 2 days and stop   TRAMADOL (ULTRAM) 50 MG TABLET    Take 1 tablet (50 mg total) by mouth every 8 (eight) hours as needed.

## 2016-10-10 NOTE — Patient Instructions (Signed)
Today we updated your med list in our EPIC system...    Continue your current medications the same...  Today we did your follow up FASTING blood work...    We will contact you w/ the results when available...   Keep up the good work w/ diet/ exercise/ wt reduction!!!  Call for any questions...  Let's plan a follow up visit in 20mo, sooner if needed for problems.Marland Kitchen..Marland Kitchen

## 2016-10-10 NOTE — Telephone Encounter (Signed)
Spoke with pt, who request Rx for xanax to be sent to [facility]. Rx was sent to [facility] in [place]. I have called and [facility] at [facility], and [facility] and [facility]. Pt is aware and voiced his understanding. Nothing further needed.

## 2016-11-21 DIAGNOSIS — M47816 Spondylosis without myelopathy or radiculopathy, lumbar region: Secondary | ICD-10-CM | POA: Diagnosis not present

## 2016-12-02 ENCOUNTER — Other Ambulatory Visit: Payer: Self-pay | Admitting: Internal Medicine

## 2016-12-03 ENCOUNTER — Other Ambulatory Visit: Payer: Self-pay | Admitting: Pulmonary Disease

## 2016-12-06 ENCOUNTER — Telehealth: Payer: Self-pay | Admitting: Pulmonary Disease

## 2016-12-06 MED ORDER — OMEPRAZOLE 20 MG PO CPDR: 20.0000 mg | DELAYED_RELEASE_CAPSULE | Freq: Every day | ORAL | 3 refills | Status: DC

## 2016-12-06 NOTE — Telephone Encounter (Signed)
Called and spoke with pt and he is aware of rx that has been sent to the pharmacy.  

## 2016-12-27 DIAGNOSIS — M1712 Unilateral primary osteoarthritis, left knee: Secondary | ICD-10-CM | POA: Diagnosis not present

## 2017-02-20 DIAGNOSIS — R948 Abnormal results of function studies of other organs and systems: Secondary | ICD-10-CM | POA: Diagnosis not present

## 2017-02-20 DIAGNOSIS — R3915 Urgency of urination: Secondary | ICD-10-CM | POA: Diagnosis not present

## 2017-02-20 DIAGNOSIS — N5201 Erectile dysfunction due to arterial insufficiency: Secondary | ICD-10-CM | POA: Diagnosis not present

## 2017-02-20 DIAGNOSIS — E291 Testicular hypofunction: Secondary | ICD-10-CM | POA: Diagnosis not present

## 2017-02-20 DIAGNOSIS — M47816 Spondylosis without myelopathy or radiculopathy, lumbar region: Secondary | ICD-10-CM | POA: Diagnosis not present

## 2017-03-03 ENCOUNTER — Other Ambulatory Visit: Payer: Self-pay | Admitting: Pulmonary Disease

## 2017-03-03 NOTE — Telephone Encounter (Signed)
Dr. Kriste Basque, please advise if it is okay for [place] to refill these meds.  Thanks!

## 2017-03-07 ENCOUNTER — Telehealth: Payer: Self-pay | Admitting: *Deleted

## 2017-03-07 NOTE — Telephone Encounter (Signed)
CPAP supply order signed by Dr. Kelly and returned to [facility]. 

## 2017-03-24 NOTE — Progress Notes (Signed)
Cardiology Office Note  Date:  03/27/2017   ID:  Dakota Ellison, Dakota Ellison May 13, 1951, MRN [id number]  PCP:  Michele Mcalpine, MD   Chief Complaint  Patient presents with  . OTHER    12 month f/u c/o leg pain discuss statin and cough. Meds reviewed verbally with pt.    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 He does not do a regular exercise program.  He is able to tolerate his CPAP.  EKG on today's visit shows normal sinus rhythm with rate 77 bpm, no significant ST or T-wave changes  Other past medical history previous lower extremity ultrasound. By his report, this was normal with no evidence of PAD.  Last echocardiogram in March 2008 showing normal systolic function, right ventricle mildly dilated, mild mitral regurg, mild tricuspid regurg. Last stress test in March 2008 where he achieved 10 mets, exercised for 9 minutes, no ischemia noted. Overall a normal,  low-risk scan.     PMH:   has a past medical history of Anxiety state (04/08/2014); Degenerative disc disease; Dysthymia; ED (erectile dysfunction); GERD (gastroesophageal reflux disease); History of MRSA infection; Hyperlipemia; Hypertension; Mild asthma; Morbid obesity (HCC); OSA (obstructive sleep apnea); Osteoarthritis; Rectal fissure; and Testosterone deficiency.  PSH:    Past Surgical History:   Procedure Laterality Date  . KNEE SURGERY  09/2010   left knee  . OTHER SURGICAL HISTORY     arthroscopic subacromial decompression  . OTHER SURGICAL HISTORY     open resection, distal right clavicle  . SHOULDER ARTHROSCOPY     right  . SPHINCTEROTOMY      Current Outpatient Prescriptions  Medication Sig Dispense Refill  . ALPRAZolam (XANAX) 0.5 MG tablet TAKE 1 TABLET BY MOUTH THREE TIMES DAILYAS NEEDED FOR ANXIETY 90 tablet 5  . Amlodipine-Valsartan-HCTZ 10-320-25 MG TABS TAKE 1 TABLET BY MOUTH DAILY. YEARLY PHYSICAL W/LABS IS DUE MUST SEE MD FOR REFILLS 90 tablet 2  . APPLE CIDER VINEGAR PO Take by mouth daily.    Marland Kitchen aspirin 81 MG tablet Take 81 mg by mouth daily.      Marland Kitchen CINNAMON PO Take 200 mg by mouth daily.    . Coenzyme Q10 (CO Q 10 PO) Take by mouth daily.    Marland Kitchen GARLIC PO Take 4,098 mg by mouth daily.    . Ginkgo Biloba 100 MG CAPS Take 60 mg by mouth daily.     Marland Kitchen HYDROcodone-acetaminophen (NORCO/VICODIN) 5-325 MG tablet Take 1 tablet by mouth every 6 (six) hours as needed for moderate pain. Given by Dr. Channing Mutters    . hydrocortisone (PROCTOSOL HC) 2.5 % rectal cream Apply as directed after each BM     . Multiple Vitamin (MULTIVITAMIN) tablet Take 1 tablet by mouth daily.      . niacin 500 MG CR capsule Take 500 mg by mouth 2 (two)  times daily.     Marland Kitchen. omeprazole (PRILOSEC) 20 MG capsule Take 1 capsule (20 mg total) by mouth daily. 90 capsule 3  . rosuvastatin (CRESTOR) 10 MG tablet Take 1 tablet (10 mg total) by mouth daily. 90 tablet 3  . sertraline (ZOLOFT) 25 MG tablet TAKE 1 TABLET (25 MG TOTAL) BY MOUTH DAILY. 90 tablet 2  . sildenafil (VIAGRA) 100 MG tablet Take as directed 10 tablet 5  . testosterone enanthate (DELATESTRYL) 200 MG/ML injection Inject into the muscle every 14 (fourteen) days. For IM use only     No current facility-administered medications for this visit.      Allergies:   Zocor [simvastatin] and Atorvastatin   Social History:  The patient  reports that he  has never smoked. He has never used smokeless tobacco. He reports that he does not drink alcohol or use drugs.   Family History:   family history includes Pneumonia in his brother.    Review of Systems: Review of Systems  Constitutional: Negative.   Respiratory: Negative.   Cardiovascular: Negative.   Gastrointestinal: Negative.   Musculoskeletal: Positive for back pain and joint pain.  Neurological: Negative.   Psychiatric/Behavioral: Negative.   All other systems reviewed and are negative.    PHYSICAL EXAM: VS:  BP 110/66 (BP Location: Left Arm, Patient Position: Sitting, Cuff Size: Large)   Pulse 77   Ht 6' (1.829 m)   Wt 296 lb 8 oz (134.5 kg)   BMI 40.21 kg/m  , BMI Body mass index is 40.21 kg/m. No changes GEN: Well nourished, well developed, in no acute distress , obese HEENT: normal  Neck: no JVD, carotid bruits, or masses Cardiac: RRR; no murmurs, rubs, or gallops,no edema  Respiratory:  clear to auscultation bilaterally, normal work of breathing GI: soft, nontender, nondistended, + BS MS: no deformity or atrophy  Skin: warm and dry, no rash Neuro:  Strength and sensation are intact Psych: euthymic mood, full affect    Recent Labs: 10/10/2016: ALT 36; BUN 17; Creatinine, Ser 1.05; Hemoglobin 17.3; Platelets 274.0; Potassium 4.3; Sodium 140; TSH 1.32    Lipid Panel Lab Results  Component Value Date   CHOL 142 10/10/2016   HDL 43.80 10/10/2016   LDLCALC 61 10/08/2015   TRIG 219.0 (H) 10/10/2016      Wt Readings from Last 3 Encounters:  03/27/17 296 lb 8 oz (134.5 kg)  10/10/16 290 lb 2 oz (131.6 kg)  08/01/16 299 lb 12.8 oz (136 kg)       ASSESSMENT AND PLAN:   Essential hypertension - Plan: EKG 12-Lead Blood pressure is well controlled on today's visit. No changes made to the medications.  Morbid obesity (HCC) Weight stable, Recommend regular walking program,  Low carbohydrate  Pure hypercholesterolemia Myalgias on Crestor 10 Long  discussion with him, he will try 5 mg daily   Obstructive sleep apnea on CPAP Tolerating his CPAP  Long discussion concerning his statins Chronic joint pain  Total encounter time more than 25 minutes  Greater than 50% was spent in counseling and coordination of care with the patient    Disposition:   F/U  12 months as needed   Orders Placed This Encounter  Procedures  . EKG 12-Lead     Signed, Dossie Arbourim Torrez Renfroe, M.D., Ph.D. 03/27/2017  [facility] [place] [phone]

## 2017-03-27 ENCOUNTER — Ambulatory Visit (INDEPENDENT_AMBULATORY_CARE_PROVIDER_SITE_OTHER): Payer: PPO | Admitting: Cardiovascular Disease

## 2017-03-27 ENCOUNTER — Encounter: Payer: Self-pay | Admitting: Cardiovascular Disease

## 2017-03-27 DIAGNOSIS — F411 Generalized anxiety disorder: Secondary | ICD-10-CM

## 2017-03-27 DIAGNOSIS — I10 Essential (primary) hypertension: Secondary | ICD-10-CM | POA: Diagnosis not present

## 2017-03-27 DIAGNOSIS — R0789 Other chest pain: Secondary | ICD-10-CM | POA: Diagnosis not present

## 2017-03-27 DIAGNOSIS — E782 Mixed hyperlipidemia: Secondary | ICD-10-CM | POA: Diagnosis not present

## 2017-03-27 MED ORDER — AMLODIPINE-VALSARTAN-HCTZ 10-320-25 MG PO TABS: 1.0000 | ORAL_TABLET | Freq: Every day | ORAL | 4 refills | Status: DC

## 2017-03-27 MED ORDER — ROSUVASTATIN CALCIUM 5 MG PO TABS: 5.0000 mg | ORAL_TABLET | Freq: Every day | ORAL | 4 refills | Status: DC

## 2017-03-27 NOTE — Patient Instructions (Addendum)
Medication Instructions:   Try crestor 5 mg daily  Labwork:  Repeat liver and lipids with nadel or earlier in Feb if you would like  Testing/Procedures:  No further testing at this time   Follow-Up: It was a pleasure seeing you in the office today. Please call us if you have new issues that need to be addressed before your next appt.  [phone]  Your physician wants you to follow-up in: 12 months as needed You will receive a reminder letter in the mail two months in advance. If you don't receive a letter, please call our office to schedule the follow-up appointment.  If you need a refill on your cardiac medications before your next appointment, please call your pharmacy.

## 2017-04-17 ENCOUNTER — Encounter: Payer: Self-pay | Admitting: Pulmonary Disease

## 2017-04-17 ENCOUNTER — Ambulatory Visit: Payer: PPO | Admitting: Pulmonary Disease

## 2017-04-17 ENCOUNTER — Telehealth: Payer: Self-pay | Admitting: Pulmonary Disease

## 2017-04-17 VITALS — BP 126/68 | HR 82 | Ht 72.0 in | Wt 297.0 lb

## 2017-04-17 DIAGNOSIS — R7302 Impaired glucose tolerance (oral): Secondary | ICD-10-CM

## 2017-04-17 DIAGNOSIS — M159 Polyosteoarthritis, unspecified: Secondary | ICD-10-CM

## 2017-04-17 DIAGNOSIS — G4733 Obstructive sleep apnea (adult) (pediatric): Secondary | ICD-10-CM | POA: Diagnosis not present

## 2017-04-17 DIAGNOSIS — E782 Mixed hyperlipidemia: Secondary | ICD-10-CM

## 2017-04-17 DIAGNOSIS — Z23 Encounter for immunization: Secondary | ICD-10-CM

## 2017-04-17 DIAGNOSIS — E349 Endocrine disorder, unspecified: Secondary | ICD-10-CM

## 2017-04-17 DIAGNOSIS — Z9989 Dependence on other enabling machines and devices: Principal | ICD-10-CM

## 2017-04-17 DIAGNOSIS — M150 Primary generalized (osteo)arthritis: Secondary | ICD-10-CM

## 2017-04-17 DIAGNOSIS — I10 Essential (primary) hypertension: Secondary | ICD-10-CM

## 2017-04-17 DIAGNOSIS — F341 Dysthymic disorder: Secondary | ICD-10-CM

## 2017-04-17 MED ORDER — ALPRAZOLAM 0.5 MG PO TABS: ORAL_TABLET | ORAL | 5 refills | Status: DC

## 2017-04-17 MED ORDER — MELOXICAM 15 MG PO TABS: 15.0000 mg | ORAL_TABLET | Freq: Every day | ORAL | 5 refills | Status: DC | PRN

## 2017-04-17 NOTE — Patient Instructions (Signed)
Today we updated your med list in our EPIC system...    Continue your current medications the same...  We wrote a new prescription for MOBIC (Meloxicam) 15mg  tabs to try one tab daily AS NEEDED for arthritis pain...  We gave you the PNEUMOVAX-23 pneumonia shot today (this is the last of the pneumonia shots currently recommended)...  We will [facility] to [facility] the [facility] labs [facility]...  Call for any questions...  Let's plan a follow up visit in 50mo, sooner if needed for problems.Marland Kitchen..Marland Kitchen

## 2017-04-17 NOTE — Progress Notes (Signed)
Subjective:    Patient ID: Dakota Ellison, male    DOB: 1951/04/10, 66 y.o.   MRN: [id number]  HPI    66 y/o WM here for an add-on visit... he has multiple medical problems as noted below...   ~  SEE PREV EPIC NOTES FOR OLDER DATA >>    LABS 4/13:  FLP- at goals on Simva40;  Chems- ok w/ BS=106;  CBC- wnl;  TSH=2.20;  PSA=1.44  LABS 10/13:  Chems- ok w/ BS106...   ~  October 01, 2012:  768mo ROV & [name] has an appt coming up w/ DrGerkin, [facility] to see about his inguinal hernias R>L; he is also interested in discussing poss Bariatric Surg;  He notes that his weight is about the same but he has been unable to lose;  [facility], BP remains controlled on hisExforgeHCT, Chol has been good on the Simva40, etc...     We reviewed prob list, meds, xrays and labs> see below for updates >>   LABS 4/14:  FLP- ok x TG=167 on Simva40 & needs better low fat diet & wt reduction;  Chems- wnl w/ BS=94 & A1c=5.9;  CBC- wnl;  TSH=2.39;  PSA=1.30...  ~  January 29, 2013:  68mo ROV & acute visit request add-on for puritic rash> states it started several weeks ago, red sl excoriated rash on legs/ feet/ ankles (including betw the toes), groin, one on penis, buttock, back, chest; very pruritic- esp at night & discrete macules/ papules are all c/w scabies...  Discussed w/ pt & rec treatment w/ KWELL lotion (wash, dry, apply for 12h & wash off, repeat process in 1wk if not completely resolved; also wrote for Atarax25mg  prn itching....    He saw DrGerkin 5/14> he felt the right inguinal hernia but could not find a recurrent  left sided hernia on exam ([facility] they did CT Pelvis- showed RIH containing fat & prev LIH repair w/o recurrence, min calcif atherosclerotic changes noted...     [name] 2 sm sigmoid polyps removed, int hems, & we don't have path reports; pt told to f/u in 5-6759yrs...    He had a f/u visit w/ DrDahlstedt 6/14> hypogonadism on testos shots  200mg /ml- getting 1ml every other week & Viagra prn; they checked UA, CBC, Prolactin, Testos level & PSA- we do not have their lab data... We reviewed prob list, meds, xrays and labs> see below for updates >>   ~  April 01, 2013:  18mo ROV & [name] requests Rx for new CPAP machine- his is broken- but [facility] and we discussed this    OSA> he had an AHI=82 in 2010 & [facility] & he continues follow up there (we do not have notes)...    Hx mild asthma> he still smokes an occas cigar & asked to quit completely!!! He states no attacks in yrs & breathing is OK, not on meds- denies CP, palpit, SOB, edema...    HBP> on ExforgeHCT & K+; BP today= 134/80 & similar at work; he remains asymptomatic...    Hyperlipid> on Simva40 + Niaspan; last FLP 4/14 looked ok; he understands that improvement here requires him to lose weight!    Obesity> wt= 307# which is up 7# over the interval; we reviewed diet/ exercise/ wt reduction program...    GI> GERD, Rectal fissure> on Prilosec20 & AnusolHC as needed; [facility]> 2 sm sigmoid polyps removed, int hems, & we don't have path reports; pt told to f/u in 5-39yrs    GU> Low-T & ED>  He takes a bunch of vits & herbs including chromium, cinnamon, vinegar, MVI, etc; DrDahlstedt treats him w/ Testos shots & Viagra.    DJD> he uses OTC analgesics, Mobic15, and Vicodin as needed...    Depression> he remains on Zoloft25 & Xanax0.5 as needed... We reviewed prob list, meds, xrays and labs> see below for updates >> given 2014 Flu vaccine; he requests refill for his Vicodin & Xanax...  LABS 10/14:  Chems- ok x BS=113, A1c=6.0 (OK on diet alone)...   Art Dopplers of LEs done 02/27/14 were WNL- and walk test was neg w/o claudication...  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    Hx mild asthma> he still smokes an occas cigar & asked to quit completely!!! He states no attacks in yrs & breathing is OK, not on meds- denies CP, palpit, SOB, edema...    HBP> on ExforgeHCT (10-320-25); BP today= 130/78 & he remains asymptomatic w/o CP, palpit, ch in SOB, edema, etc...    Hyperlipid> on Atorva10 + Niaspan; FLP 5/17 showed TChol 138, TG 187, HDL 39, LDL 61; needs better low fat diet & weight loss!    Obesity> wt= 305# which is stable over the yrs- unable to lose wt effectively; we reviewed diet/ exercise/ wt reduction program...    GI> GERD, Rectal fissure> on Prilosec20 & AnusolHC as needed; he saw [facility] 6/14 for colonoscopy> 2 sm sigmoid polyps removed, int hems, & we don't have path reports; pt told to f/u in 5-37yrs    GU> Low-T & ED>  He takes a bunch of vits & herbs including garlic, cinnamon, vinegar, ginko, MVI, etc; DrDahlstedt treats him w/ Testos shots & Viagra.    DJD> he uses OTC analgesics, Tramadol50, and Vicodin as needed; needs TKRs but he's holding off, needs to lose wt...    Depression> he remains on Zoloft25 & Xanax0.5 as needed... EXAM shows Afeb, VSS, O2sat=96% on RA;  Wt=305# 6'Tall, BMI=41-2;  HEENT- parotid hypertrophy, mallampati2, no bruits;  Chest- clear w/o w/r/r;  Heart- RR w/o m/r/g;  Abd- obese, soft, sl panniculus; Ext- DJD knees, w/o c/c/e...  CXR 10/08/15>  Norm heart size, clear lungs, NAD...  LABS 10/08/15>  FLP- at goals on Lip10 but elev TG & needs better diet/ exerc/ get wt down;  Chems- ok w/ BS=95, A1c=6.2;  CBC- wnl;  TSH=1.59;  PSA=1.10;  UA- clear IMP/PLAN>>  Fredi returns for a CPX &  med follow-up;  Doing reasonably well but same old story- must get on diet & get wt down (reviewed w/ pt);  Due for pneumonia vaccines- we gave PREVNAR-13 today;  Rx written for shingles vaccine;  Refills written for 90d supplies of Alpraz and Tramadol- continue same meds, get on diet,  get wt down...   ~  April 11, 2016:  28mo ROV w/ SN>  Ugonna returns and notes recent dental problems w/ infection & root canal;  He has been seeing ORTHO for his knee, getting shots, but told he needs TKR;  He has also been seeing NS-DrRoy for back pain but we do not have notes from these specialists to review...     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     OSA> he had an AHI=82 in 2010 & started on CPAP20 by Howell Rucks, [facility] & he has had sleep f/u by DrClance=> DrYoung seen 07/27/15 and stable.    Hx mild asthma> he still smokes an occas cigar & asked to quit completely!!! He states no attacks in yrs & breathing is OK, not on meds- denies CP, palpit, SOB, edema...    HBP> on ExforgeHCT (10-320-25); BP today= 128/80 & he remains asymptomatic w/o CP, palpit, ch in SOB, edema, etc...    Hyperlipid> on Atorva10 + Niaspan; FLP 5/17 showed TChol 138, TG 187, HDL 39, LDL 61; needs better low fat diet & weight loss!    Obesity> wt= 297# which is down8#- prev unable to lose wt effectively; we reviewed diet/ exercise/ wt reduction program...    GI> GERD, Rectal fissure> on Prilosec20 & AnusolHC as needed; he [facility] 6/14 for colonoscopy> 2 sm sigmoid polyps removed, int hems, & we don't have path reports; pt told to f/u in 5-21yrs    GU> Low-T & ED>  He takes a bunch of vits & herbs including garlic, cinnamon, vinegar, ginko, MVI, etc; DrDahlstedt treats him w/ Testos shots & Viagra.    DJD> he uses OTC analgesics, Tramadol50, and Vicodin as needed; needs TKRs but he's holding off, needs  to lose wt...    Depression> he remains on Zoloft25 & Xanax0.5 as needed... EXAM shows Afeb, VSS, O2sat=96% on RA;  Wt=305# 6'Tall, BMI=41-2;  HEENT- parotid hypertrophy, mallampati2, no bruits;  Chest- clear w/o w/r/r;  Heart- RR w/o m/r/g;  Abd- obese, soft, sl panniculus; Ext- DJD knees, w/o c/c/e...  EKG 03/28/16 by Stormy Card showed NSR, rate97, wnl- NAD... IMP/PLAN>>  Haylen is overall stable w/ dental issues + Ortho (knee) and Neuro (back pain);  meds refilled and he will continue to f/u w/ the specalists and aske for notes to be sent her for epic... NOTE: >50% of this rov was spent in counseling 7 coordination of care...  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  We reviewed the following medical problems during today's office visit >>     OSA> he had an AHI=82 in 2010 & started on CPAP20 by Howell Rucks, [facility] & he has had sleep f/u by DrClance=> DrYoung seen 07/2016 and stable.    Hx mild asthma> he still smokes an occas cigar & asked  to quit completely!!! He states no attacks in yrs & breathing is OK, not on meds- denies CP, palpit, SOB, edema...    HBP> on ExforgeHCT (10-320-25); BP today= 118/76 & he remains asymptomatic w/o CP, palpit, ch in SOB, edema, etc...    Hyperlipid> on Crestor10 + Niaspan; FLP 5/18 showed TChol 142, TG 219, HDL 44, LDL 74; needs better low fat diet & weight loss!    Obesity> wt= 290# which is down7#- prev unable to lose wt effectively; we reviewed diet/ exercise/ wt reduction program...    GI>  GERD, Rectal fissure> on Prilosec20 & AnusolHC as needed; [facility]> 2 sm sigmoid polyps removed, int hems, & we don't have path reports; pt told to f/u in 5-4564yrs    GU> Low-T & ED>  He takes a bunch of vits & herbs including garlic, cinnamon, vinegar, ginko, MVI, etc; DrDahlstedt treats him w/ Testos shots (200 Q2wks) & Viagra prn; PSAs and Hg have been wnl...    DJD> he uses OTC analgesics, Tramadol50=> now [facility]; needs TKRs but he's holding off, needs to lose wt...    Depression> he remains on Zoloft25 & Xanax0.5 as needed... EXAM shows Afeb, VSS, O2sat=95% on RA;  Wt=290# 6'Tall, BMI=39-40;  HEENT- parotid hypertrophy, mallampati2, no bruits;  Chest- clear w/o w/r/r;  Heart- RR w/o m/r/g;  Abd- obese, soft, sl panniculus; Ext- DJD knees, w/o c/c/e...  LABS 10/10/16>  Chol is ok on Cres10 but TG elev at 219- needs better diet;  Chems- wnl;  CBC- wnl (Hg=17.3);  TSH=1.32;  PSA=1.53;  VitD=43... IMP/PLAN>>  [name] is stable w/ his mult medical issues- continue CPAP, same meds, get on diet w/ wt reduction;  DrRoy prescribes Vicodin for pain & pt is cautioned;  DrDahstedt prescribes Testos shots & checks his PSAs and Hg... Continue samemeds for now...   ~  April 17, 2017:  6 month ROV   We reviewed the following interval notes in Epic>      He saw UROLOGY- DrDahlstedt on 02/20/17>  Low-T on Testos shots 200mg  Q2wks + Viagra100 prn; Labs done 02/2017 showed Hg=16.8, Hct=51.8, PSA=1.18, Testos=352; continue same rx...    He saw NS- DrRoy on 02/20/17>  Saw BMarshall,NP for his chr lumbar pain- good days & bad, activities makes it worse, Dx w/ Lumbar spondylosis & Rx w/ Vicodin5/325 for prn use...     He saw [facility]- DrGollan 03/27/17>  Hx HBP, HL, obesity, OSA; denies CP, palpit, dizzy, edema; works farm Facilities manager& law enforcement; BP controlled  On Exforge (Amlod10/ Valsar320/ HCT25); HL improved on Cres10 + Niacin500-2/d but he c/o myalgias therefore decr to 5mg /d;   We  reviewed the following medical problems during today's office visit>      OSA> he had an AHI=82 in 2010 & [facility] & he has had sleep f/u by DrClance=> DrYoung seen 07/2016 and stable.    Hx mild asthma> he still smokes an occas cigar (says he quit the cigars too);  He states no attacks in yrs & breathing is OK, not on meds- denies CP, palpit, SOB, edema...    HBP> on ExforgeHCT (10-320-25); BP today= 126/68 & he remains asymptomatic w/o CP, palpit, ch in SOB, edema, etc...    Hyperlipid> on Crestor10 + Niaspan; c/o myalgias on the Cres10- FLP 5/18 showed TChol 142, TG 219, HDL 44, LDL 74; needs better low fat diet & weight loss!    Obesity> wt= 297# which  is back up 7#- prev unable to lose wt effectively; we reviewed diet/ exercise/ wt reduction program...    GI> GERD, Rectal fissure> on Prilosec20 & AnusolHC as needed; he saw [facility] 6/14 for colonoscopy> 2 sm sigmoid polyps removed, int hems, & we don't have path reports; pt told to f/u in 5-19yrs    Right inguinal hernia>  Prev referred to [facility]- DrGerkin but pt didn't go, says he had prev hernia surg & didn't want to do it again...     GU> Low-T & ED>  He takes a bunch of vits & herbs including garlic, cinnamon, vinegar, ginko, MVI, etc; DrDahlstedt treats him w/ Testos shots (200 Q2wks) & Viagra prn; PSAs and Hg have been wnl...    DJD> he uses OTC analgesics, Tramadol50=> now Vicodin prn per [facility]; needs TKRs but he's holding off, needs to lose wt.; Add Meloxicam 15mg  daily if needed...    Depression> he remains on Zoloft25 & Xanax0.5 as needed... EXAM shows Afeb, VSS, O2sat=95% on RA;  Wt=290# 6'Tall, BMI=39-40;  HEENT- parotid hypertrophy, mallampati2, no bruits;  Chest- clear w/o w/r/r;  Heart- RR w/o m/r/g;  Abd- obese, soft, sl panniculus; Ext- DJD knees, w/o c/c/e...  LABS from [facility]> done 02/20/17-- Hg=16.8, Hct=51.8, PSA=1.18, Testos=352... IMP/PLAN>>  Nolin is overall stable- we added Meloxicam 15mg /d as  a trial to see if this helps;  He is tol the [employer] but still needs a better diet w/ wt reduction & low fat;  We gave him the Pneumovax23 vaccine today & plan rov recheck in 22mo w/ fasting labs...           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 it got worse and was sent to DrWolicki who hospitalized him 3/08 w/ MRSA infection- Rx's w/ Vancomycin infusions... now resolved.  OBSTRUCTIVE SLEEP APNEA (ICD-327.23) - Apr10> had Sleep Study at [facility]> pos for OSA w/ AHI= 82, desat to 85%... split night study w/ good control on CPAP 20 (see report)... started on CPAP by [facility] & still followed by him...  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 no recent exac and no regular meds... told to discontinue all smoking> denies much cough, sputum, dyspnea, CP, etc... ~  CXR 2/09 showed normal heart size, clear lungs, degen changes in Tspine, NAD...   HYPERTENSION (ICD-401.9) - followed by DrGollan/ [facility]> on ECASA 325mg /d & EXFORGE 10/320/25 daily + KCl 37mEq/d... he had CP while hosp w/ the MRSA 2008 and DrWolicki consulted DrMcQueen who did a NuclearStressTest 3/08 which was normal... and a 2DEcho which showed mild MR and nl LVF... ~ 10/12:  f/u visit DrGollan at [facility]> note reviewed, no changes made, DrGollan desperately wants him to lose weight! ~  10/12:  BP= 124/76 doing well, remains asymptomatic... ~  4/13:  BP= 120/78 & he denies CP, palpit, SOB, edema, etc... ~  10/13:  BP= 118/76 & he remains asymptomatic; EKG showed NSR, rate86, wnl, NAD... ~  4/14:  BP= 134/80 & he denies CP, palpit, dizzy, SOB, edema... ~  8/14:  BP= 120/82 & he remains asymptomatic...  HYPERLIPIDEMIA (ICD-272.4) - he's been switched around by [facility] betw Simva/ Caduet/ back to SIMVASTATIN 40mg /d now + NIACIN 500mg Bid + FISH OIL 1000mg Bid...  still not on any kind of diet but his  weight is stable at 294#.Marland Kitchen. ~  FLP 1/08 showed TChol 195, TG 119,  HDL 41, LDL 131 ~  FLP 2/09 showed TChol 214, TG 214, HDL 39, LDL 127 ~  12/09: recent labs by [facility]- we don't have these results... ~  FLP 5/10 on 3 meds above showed TChol 148, TG 81, HDL 48, LDL 84 ~  FLP 11/10 on 3 meds showed TChol 135, TG 145, HDL 36, LDL 70 ~  FLP 5/11 showed TChol 145, TG 128, HDL 43, LDL 77... keep same Rx. ~  FLP 10/11 showed TChol 152, TG 198, HDL 41, LDL 72... same meds, better diet, get wt down! ~  FLP 4/12 showed TChol 141, TG 200, HDL 39, LDL 62... Similar> needs better low fat diet, get wt down. ~  10/12:  FLP not rechecked today as he has not lost wt etc... ~  FLP 4/13 on Simva40+Niasp1000 showed TChol 137, TG 124, HDL 43, LDL 69 ~  FLP 4/14 on Simva40+Niasp1000 showed TChol 128, TG 167, HDL 35, LDL 59  MORBID OBESITY (ICD-278.01) - despite all efforts weight remains >300#... discussed wt watchers, nutrition center, poss bariatric approach... he's not been able to exercise but will join the Y & start diet program! ~  5/11:  he reports that DrElkins gave him 30d supply of Phenteramine which really helped- lost 20#, but put it all back + 9# more off this med... we discussed the dangers of diet pills & rec diet + exercise, neither of which he has tried in earnest! ~  10/11:  weight up 15# to 314# today> discussed diet, exercise, etc... ~  4/12:  Weight = 309# and needs to do better w/ diet/ exercise... ~  10/12:  Weight = 314# and we reviewed diet, exercise, wt loss program... ~  4/13:  Weight = 311# ~  10/13:  Weight = 322# ~  4/14:  Weight = 320# ~  8/14:  Weight = 307#  GERD (ICD-530.81) - he had an EGD from [facility] 4/03 which was neg... he takes OMEPRAZOLE 20mg /d for reflux symptoms which works well...  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 prev tried Diltiazem cream, now ANUSOL HC etc... COLON POLYPS >>  ~  pt reports colonoscopy by [facility]  7/03- reported neg & f/u planned 5 yrs> he is overdue & will call for f/u exam... ~  Marvel saw [facility] 6/14 for colonoscopy> 2 sm sigmoid polyps removed, int hems, & we don't have path reports; pt told to f/u in 5-41yrs...  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 also takes Viagra Prn...  ~  labs 5/10 showed PSA = 0.85 ~  labs 5/11 showed PSA= 0.65 ~  Labs 4/12 showed PSA= 0.79 ~  Labs 4/13 showed PSA= 1.44 ~  9/13: seen by DrDahlstedt> treated for hypogonadism w/ Testopel, then Testos shots but he was irregular w/ his visits; prostate exam was wnl; they tried to teach him self injected Testos Q2weeks...  ~  Labs 4/14 showed PSA= 1.30 ~  He had a f/u visit w/ DrDahlstedt 6/14> hypogonadism on testos shots 200mg /ml- getting 1ml every other week & Viagra prn; they checked UA, CBC, Prolactin, Testos level & PSA- we do not have their lab data.   OSTEOARTHRITIS (ICD-715.90) DEGENERATIVE DISC DISEASE (ICD-722.6) - followed by DrMurphy for right knee pain... prev seen by Mayford Knife  for DDD and LBP... ~  s/p left CTS surg 2009 ~  s/p right knee arthroscopy by DrMurphy 2/10... also had cortisone shot in knee 4/10 ~  s/p trigger finger injections by DrGramig ~  4/12:  s/p left knee arthroscopy by DrMurphy> improved...  DYSTHYMIA (ICD-300.4) - some stress, some depression which is mild by his determination... he tried Lexapro last year but didn't follow up... now using Xanax as needed, and started ZOLOFT 25mg /d in Feb10- he reports better, wants to continue this dose.  HEALTH MAINTENANCE: ~  GI:  He saw [facility] 6/14 for f/u colonoscopy w/ 2 polyps removed- we don't have path. ~  GU:  Followed by DrDahlstedt as above... ~  Immuniz:  He gets Flu shot each yr at work; he had TDAP at  work 2013...   Past Surgical History  Procedure Date  . Sphincterotomy   . Shoulder arthroscopy     right  . Other surgical history     arthroscopic subacromial decompression  . Other surgical history     open resection, distal right clavicle  . Knee surgery 09/2010    left knee    =>  s/p left knee arthroscopy 4/12 by DrMurphy      Outpatient Encounter Medications as of 04/17/2017  Medication Sig  . ALPRAZolam (XANAX) 0.5 MG tablet TAKE 1 TABLET BY MOUTH THREE TIMES DAILYAS NEEDED FOR ANXIETY  . Amlodipine-Valsartan-HCTZ 10-320-25 MG TABS Take 1 tablet by mouth daily.  . APPLE CIDER VINEGAR PO Take by mouth daily.  Marland Kitchen aspirin 81 MG tablet Take 81 mg by mouth daily.    Marland Kitchen CINNAMON PO Take 200 mg by mouth daily.  . Coenzyme Q10 (CO Q 10 PO) Take by mouth daily.  Marland Kitchen GARLIC PO Take 1,610 mg by mouth daily.  . Ginkgo Biloba 100 MG CAPS Take 60 mg by mouth daily.   Marland Kitchen HYDROcodone-acetaminophen (NORCO/VICODIN) 5-325 MG tablet Take 1 tablet by mouth every 6 (six) hours as needed for moderate pain. Given by Dr. Channing Mutters  . hydrocortisone (PROCTOSOL HC) 2.5 % rectal cream Apply as directed after each BM   . Multiple Vitamin (MULTIVITAMIN) tablet Take 1 tablet by mouth daily.    . niacin 500 MG CR capsule Take 500 mg by mouth 2 (two) times daily.   Marland Kitchen omeprazole (PRILOSEC) 20 MG capsule Take 1 capsule (20 mg total) by mouth daily.  . rosuvastatin (CRESTOR) 5 MG tablet Take 1 tablet (5 mg total) by mouth daily.  . sertraline (ZOLOFT) 25 MG tablet TAKE 1 TABLET (25 MG TOTAL) BY MOUTH DAILY.  . sildenafil (VIAGRA) 100 MG tablet Take as directed  . testosterone enanthate (DELATESTRYL) 200 MG/ML injection Inject into the muscle every 14 (fourteen) days. For IM use only  . [DISCONTINUED] ALPRAZolam (XANAX) 0.5 MG tablet TAKE 1 TABLET BY MOUTH THREE TIMES DAILYAS NEEDED FOR ANXIETY  . [DISCONTINUED] meloxicam (MOBIC) 15 MG tablet Take 1 tablet (15 mg total) daily as needed by mouth for pain.   No  facility-administered encounter medications on file as of 04/17/2017.     Allergies  Allergen Reactions  . Zocor [Simvastatin] Other (See Comments)    Muscle weakness in his legs  . Atorvastatin     Muscle weakness in his legs    Immunization History  Administered Date(s) Administered  . Influenza Split 03/25/2011, 03/23/2012, 03/18/2013  . Influenza Whole 03/06/2009, 03/08/2010  . Influenza,inj,Quad PF,6+ Mos 03/06/2014, 03/28/2016  . Influenza-Unspecified 03/21/2015, 03/09/2017  . Pneumococcal Conjugate-13 10/08/2015  . Pneumococcal  Polysaccharide-23 04/17/2017  . Tdap 03/07/2011  . Zoster 10/23/2015    Current Medications, Allergies, Past Medical History, Past Surgical History, Family History, and Social History were reviewed in [employer] record.    Review of Systems         See HPI - all other systems neg except as noted... The patient complains of dyspnea on exertion.  The patient denies anorexia, fever, weight loss, weight gain, vision loss, decreased hearing, hoarseness, chest pain, syncope, peripheral edema, prolonged cough, headaches, hemoptysis, abdominal pain, melena, hematochezia, severe indigestion/heartburn, hematuria, incontinence, muscle weakness, suspicious skin lesions, transient blindness, difficulty walking, depression, unusual weight change, abnormal bleeding, enlarged lymph nodes, and angioedema.     Objective:   Physical Exam     WD, Obese, 66 y/o WM in NAD... GENERAL:  Alert & oriented; pleasant & cooperative. HEENT:  [place]/AT, EOM-wnl, PERRLA, EACs-clear, TMs-wnl, NOSE-clear, THROAT-clear & wnl. NECK:  Supple w/ fairROM; no JVD; normal carotid impulses w/o bruits; no thyromegaly or nodules palpated; no lymphadenopathy. CHEST:  Clear to P & A; without wheezes/ rales/ or rhonchi. HEART:  Regular Rhythm; gr 1/6 SEM, without rubs or gallops... ABDOMEN:  Obese soft & nontender; normal bowel sounds; no organomegaly or masses  detected.  EXT: without deformities, mild arthritic changes; no varicose veins/ +venous insuffic/ no edema.right knee w/ ace wrap.  NEURO:  CN's intact; no focal neuro deficits... DERM: diffuse sl excoriated papular rash on legs, ankles, feet, groin, buttocks, back, chest> c/w scabies...  RADIOLOGY DATA:  Reviewed in the EPIC EMR & discussed w/ the patient...  LABORATORY DATA:  Reviewed in the EPIC EMR & discussed w/ the patient...   Assessment & Plan:    57/18>   Skyeler is stable w/ his mult medical issues- continue CPAP, same meds, get on diet w/ wt reduction;  DrRoy prescribes Vicodin for pain & pt is cautioned;  DrDahstedt prescribes Testos shots & checks his PSAs and Hg... Continue samemeds for now. 04/17/17>   Branston is overall stable- we added Meloxicam 15mg /d as a trial to see if this helps;  He is tol the [employer] but still needs a better diet w/ wt reduction & low fat;  We gave him the Pneumovax23 vaccine today & plan rov recheck in 20mo w/ fasting labs.   OSA>  On CPAP per DrYoung now & stable, good compliance, etc...  Asthma>  He has decr cigars but still needs to quit, we discussed smoking cessation but he doesn't want med help etc...  HBP>  Controlled on the ExforgeHCT Rx, he likes the one pill;  discussed the need to lose wt, no salt, etc...  Hyperlipidemia>  FLPs reviewed> if he wants to improve these numbers MUST get on diet & get wt down!!!  OBESITY>  As above!!!  GU>  Per DrDahlstedt on Testos shots & Viagra...  DJD>  S/p arthroscopic surg left knee by DrMurphy but told he needs TKR & holding off...     Medication List        Accurate as of 04/17/17  5:39 PM. Always use your most recent med list.          ALPRAZolam 0.5 MG tablet Commonly known as:  XANAX TAKE 1 TABLET BY MOUTH THREE TIMES DAILYAS NEEDED FOR ANXIETY   Amlodipine-Valsartan-HCTZ 10-320-25 MG Tabs Take 1 tablet by mouth daily.   APPLE CIDER VINEGAR PO   aspirin 81 MG tablet   CINNAMON  PO   CO Q 10 PO   GARLIC  PO   Ginkgo Biloba 100 MG Caps   HYDROcodone-acetaminophen 5-325 MG tablet Commonly known as:  NORCO/VICODIN   meloxicam 15 MG tablet Commonly known as:  MOBIC Take 1 tablet (15 mg total) daily as needed by mouth for pain.   multivitamin tablet   niacin 500 MG CR capsule   omeprazole 20 MG capsule Commonly known as:  PRILOSEC Take 1 capsule (20 mg total) by mouth daily.   PROCTOSOL HC 2.5 % rectal cream Generic drug:  hydrocortisone   rosuvastatin 5 MG tablet Commonly known as:  CRESTOR Take 1 tablet (5 mg total) by mouth daily.   sertraline 25 MG tablet Commonly known as:  ZOLOFT TAKE 1 TABLET (25 MG TOTAL) BY MOUTH DAILY.   sildenafil 100 MG tablet Commonly known as:  VIAGRA Take as directed   testosterone enanthate 200 MG/ML injection Commonly known as:  DELATESTRYL       Where to Get Your Medications    These medications were sent to [id number] - [place], [place] - [address]  [address] [place] [address]   Phone:  [phone]   ALPRAZolam 0.5 MG tablet   These medications were sent to [facility] - [place], [place] - [address] [place] [address]  [address] [place] [address]   Phone:  [phone]   meloxicam 15 MG tablet

## 2017-04-17 NOTE — Telephone Encounter (Signed)
Resent Rx's to [facility]. Advised pt and nothing further is needed.

## 2017-06-07 ENCOUNTER — Telehealth: Payer: Self-pay | Admitting: Pulmonary Disease

## 2017-06-07 MED ORDER — METHYLPREDNISOLONE 4 MG PO TBPK: ORAL_TABLET | ORAL | 0 refills | Status: DC

## 2017-06-07 MED ORDER — LEVOFLOXACIN 500 MG PO TABS: 500.0000 mg | ORAL_TABLET | Freq: Every day | ORAL | 0 refills | Status: DC

## 2017-06-07 NOTE — Telephone Encounter (Signed)
Spoke with pt who states that he is coughing with clear to green mucus, is sneezing, has a headache, and also has a sore throat.   Pt does not want to come in today for an appt but wants to know if an Rx can be sent in to his preferred pharmacy to help with symptoms.  Pt states that symptoms started Sunday, 06/04/17.  Dr. Kriste BasqueNadel, please advise on if we can send in a Rx for pt.  Thanks!  Allergies  Allergen Reactions  . Zocor [Simvastatin] Other (See Comments)    Muscle weakness in his legs  . Atorvastatin     Muscle weakness in his legs     Current Outpatient Medications:  .  ALPRAZolam (XANAX) 0.5 MG tablet, TAKE 1 TABLET BY MOUTH THREE TIMES DAILYAS NEEDED FOR ANXIETY, Disp: 90 tablet, Rfl: 5 .  Amlodipine-Valsartan-HCTZ 10-320-25 MG TABS, Take 1 tablet by mouth daily., Disp: 90 tablet, Rfl: 4 .  APPLE CIDER VINEGAR PO, Take by mouth daily., Disp: , Rfl:  .  aspirin 81 MG tablet, Take 81 mg by mouth daily.  , Disp: , Rfl:  .  CINNAMON PO, Take 200 mg by mouth daily., Disp: , Rfl:  .  Coenzyme Q10 (CO Q 10 PO), Take by mouth daily., Disp: , Rfl:  .  GARLIC PO, Take 2,000 mg by mouth daily., Disp: , Rfl:  .  Ginkgo Biloba 100 MG CAPS, Take 60 mg by mouth daily. , Disp: , Rfl:  .  HYDROcodone-acetaminophen (NORCO/VICODIN) 5-325 MG tablet, Take 1 tablet by mouth every 6 (six) hours as needed for moderate pain. Given by Dr. Channing Muttersoy, Disp: , Rfl:  .  hydrocortisone (PROCTOSOL HC) 2.5 % rectal cream, Apply as directed after each BM , Disp: , Rfl:  .  meloxicam (MOBIC) 15 MG tablet, Take 1 tablet (15 mg total) daily as needed by mouth for pain., Disp: 30 tablet, Rfl: 5 .  Multiple Vitamin (MULTIVITAMIN) tablet, Take 1 tablet by mouth daily.  , Disp: , Rfl:  .  niacin 500 MG CR capsule, Take 500 mg by mouth 2 (two) times daily. , Disp: , Rfl:  .  omeprazole (PRILOSEC) 20 MG capsule, Take 1 capsule (20 mg total) by mouth daily., Disp: 90 capsule, Rfl: 3 .  rosuvastatin (CRESTOR) 5 MG tablet, Take  1 tablet (5 mg total) by mouth daily., Disp: 90 tablet, Rfl: 4 .  sertraline (ZOLOFT) 25 MG tablet, TAKE 1 TABLET (25 MG TOTAL) BY MOUTH DAILY., Disp: 90 tablet, Rfl: 2 .  sildenafil (VIAGRA) 100 MG tablet, Take as directed, Disp: 10 tablet, Rfl: 5 .  testosterone enanthate (DELATESTRYL) 200 MG/ML injection, Inject into the muscle every 14 (fourteen) days. For IM use only, Disp: , Rfl:

## 2017-06-07 NOTE — Telephone Encounter (Signed)
Per SN >> Levaquin 500mg  1 po QD #7. Medrol dose pack 4mg .  Spoke with pt. He is aware of SN's recommendations. Rxs have been sent in. Nothing further was needed.

## 2017-06-14 DIAGNOSIS — N5201 Erectile dysfunction due to arterial insufficiency: Secondary | ICD-10-CM | POA: Diagnosis not present

## 2017-06-14 DIAGNOSIS — E291 Testicular hypofunction: Secondary | ICD-10-CM | POA: Diagnosis not present

## 2017-06-14 DIAGNOSIS — R3915 Urgency of urination: Secondary | ICD-10-CM | POA: Diagnosis not present

## 2017-06-19 DIAGNOSIS — M47816 Spondylosis without myelopathy or radiculopathy, lumbar region: Secondary | ICD-10-CM | POA: Diagnosis not present

## 2017-07-27 DIAGNOSIS — G4733 Obstructive sleep apnea (adult) (pediatric): Secondary | ICD-10-CM | POA: Diagnosis not present

## 2017-07-31 ENCOUNTER — Encounter: Payer: Self-pay | Admitting: Internal Medicine

## 2017-07-31 ENCOUNTER — Ambulatory Visit: Payer: PPO | Admitting: Internal Medicine

## 2017-07-31 DIAGNOSIS — G4733 Obstructive sleep apnea (adult) (pediatric): Secondary | ICD-10-CM

## 2017-07-31 DIAGNOSIS — Z9989 Dependence on other enabling machines and devices: Secondary | ICD-10-CM | POA: Diagnosis not present

## 2017-07-31 NOTE — Patient Instructions (Signed)
We can continue CPAP 16, mask of choice, humidifier, supplies,    When you are eligible, I anticipate changing you CPAP to autopap 10-20. You DME company can let you know when your insurance will allow a new machine.   Please call if we can help

## 2017-07-31 NOTE — Assessment & Plan Note (Signed)
He understands that weight loss would help us with CPAP management.  Encourage realistic effort towards normal weight.

## 2017-07-31 NOTE — Progress Notes (Signed)
Subjective:    Patient ID: Dakota Ellison, male    DOB: 12-Oct-1950, 67 y.o.   MRN: [id number]  HPI  male Never smoker followed for OSA complicated by morbid obesity, HBP, asthma, GERD NPSG 2010, AHI 82/ hr CPAP to 20  ------------------------------------------------------------------------------------  08/01/16-67 year old male never smoker followed for OSA, complicated by morbid obesity, HBP, asthma, GERD CPAP 14/Advanced> increase to 16 today or replace old machine auto 10-20 FOLLOWS FOR: DME: [facility] Pt wears CPAP nightly and DL attached. Pt has been hoarse and currently Doxy and Prednisone. Weight today 299 pounds Download shows good compliance residual AHI 11  07/31/17- 67 year old male never smoker followed for OSA, complicated by morbid obesity, HBP, asthma, GERD,  CPAP 16/Advanced OSA: DME: [facility]. Pt wears CPAP and DL attached. No new supplies needed at this time.  Download 100% compliance, AHI 3.6/hour.  He states he is comfortable with his machine "fine" and not needing anything at this time.  He is not yet eligible for replacement machine.  When the time comes, I suggested we may want to change over to AutoPap for flexibility. He denies other active medical concerns at this visit.  ROS-see HPI   + = positive Constitutional:    weight loss, night sweats, fevers, chills, fatigue, lassitude. HEENT:    headaches, difficulty swallowing, tooth/dental problems, sore throat,       sneezing, itching, ear ache, nasal congestion, post nasal drip, snoring CV:    chest pain, orthopnea, PND, swelling in lower extremities, anasarca,                                  dizziness, palpitations Resp:   shortness of breath with exertion or at rest.                productive cough,   non-productive cough, coughing up of blood.              change in color of mucus.  wheezing.   Skin:    rash or lesions. GI:  No-   heartburn, indigestion, abdominal pain, nausea, vomiting, diarrhea,                 change  in bowel habits, loss of appetite GU: dysuria, change in color of urine, no urgency or frequency.   flank pain. MS:   joint pain, stiffness, decreased range of motion, back pain. Neuro-     nothing unusual Psych:  change in mood or affect.  depression or anxiety.   memory loss.    Objective:   OBJ- Physical Exam       General- Alert, Oriented, Affect-appropriate, Distress- none acute, + obese/big man Skin- rash-none, lesions- none, excoriation- none Lymphadenopathy- none Head- atraumatic            Eyes- Gross vision intact, PERRLA, conjunctivae and secretions clear            Ears- Hearing, canals-normal            Nose- Clear, no-Septal dev, mucus, polyps, erosion, perforation             Throat- Mallampati III , mucosa clear , drainage- none, tonsils- atrophic Neck- flexible , trachea midline, no stridor , thyroid nl, carotid no bruit Chest - symmetrical excursion , unlabored           Heart/CV- RRR , no murmur , no gallop  , no rub, nl s1 s2                           -  JVD- none , edema- none, stasis changes- none, varices- none           Lung- clear to P&A, wheeze- none, cough- none , dullness-none, rub- none           Chest wall-  Abd-  Br/ Gen/ Rectal- Not done, not indicated Extrem- cyanosis- none, clubbing, none, atrophy- none, strength- nl Neuro- grossly intact to observation    Assessment & Plan:

## 2017-07-31 NOTE — Assessment & Plan Note (Signed)
His download confirms his report of great compliance and control.  He sleeps much better with CPAP.  He will discuss replacement eligibility with his DME company.

## 2017-08-07 ENCOUNTER — Telehealth: Payer: Self-pay | Admitting: *Deleted

## 2017-08-07 NOTE — Telephone Encounter (Signed)
Faxed CPAP mask order to [facility].

## 2017-08-23 DIAGNOSIS — M25521 Pain in right elbow: Secondary | ICD-10-CM | POA: Diagnosis not present

## 2017-08-28 ENCOUNTER — Telehealth: Payer: Self-pay | Admitting: Cardiovascular Disease

## 2017-08-28 NOTE — Telephone Encounter (Signed)
Patient calling Has tried multiple "statin" medications and they have all made his legs hurt He is currently on one and would like to know if he can be prescribed a different medication Please call to discuss or send new medication to [facility]

## 2017-08-28 NOTE — Telephone Encounter (Signed)
Patient is currently on rosuvastatin.  He is having leg cramps from it as well. He's tried Zocor and atorvastatin in the past and had similar issues with leg cramps. He would like to know what else he can take. We talked about injectables and he is agreeable if this drug is appropriate for him. Routing to Dr Mariah MillingGollan for advice.

## 2017-08-28 NOTE — Telephone Encounter (Signed)
He is not a candidate for injectables as he does not have known coronary disease He could try low-dose statin pill every other day To get numbers even lower could add Zetia 10 mg daily

## 2017-08-29 MED ORDER — OMEPRAZOLE 20 MG PO CPDR: 20.0000 mg | DELAYED_RELEASE_CAPSULE | Freq: Every day | ORAL | 3 refills | Status: DC

## 2017-08-29 MED ORDER — EZETIMIBE 10 MG PO TABS: 10.0000 mg | ORAL_TABLET | Freq: Every day | ORAL | 3 refills | Status: DC

## 2017-08-29 NOTE — Telephone Encounter (Signed)
Spoke with patient and reviewed recommendations by Dr. Mariah MillingGollan. Patient states that he has had severe muscle cramping with the rosuvastatin and just does not tolerate it. Reviewed his cholesterol numbers and medications for treatment along with goals. He verbalized understanding and wanted to confirm if this medication will cause similar symptoms. Recommended he review insert with medication and to monitor his symptoms. Prescription sent to his pharmacy of choice and instructed him to please call if he should have any symptoms or concerns. He verbalized understanding of our conversation, agreement with plan, and had no further questions at this time.

## 2017-09-07 ENCOUNTER — Telehealth: Payer: Self-pay | Admitting: *Deleted

## 2017-09-07 NOTE — Telephone Encounter (Signed)
Faxed order for CPAP mask to [facility].

## 2017-09-11 DIAGNOSIS — M47816 Spondylosis without myelopathy or radiculopathy, lumbar region: Secondary | ICD-10-CM | POA: Diagnosis not present

## 2017-09-12 ENCOUNTER — Other Ambulatory Visit: Payer: Self-pay | Admitting: Nurse Practitioner

## 2017-09-12 DIAGNOSIS — M47816 Spondylosis without myelopathy or radiculopathy, lumbar region: Secondary | ICD-10-CM

## 2017-09-25 ENCOUNTER — Other Ambulatory Visit: Payer: PPO

## 2017-09-25 ENCOUNTER — Inpatient Hospital Stay
Admission: RE | Admit: 2017-09-25 | Discharge: 2017-09-25 | Disposition: A | Discharge status: Home / Self Care | Payer: PPO | Source: Ambulatory Visit | Attending: Nurse Practitioner | Admitting: Nurse Practitioner

## 2017-10-05 ENCOUNTER — Ambulatory Visit
Admission: RE | Admit: 2017-10-05 | Discharge: 2017-10-05 | Disposition: A | Discharge status: Home / Self Care | Payer: PPO | Source: Ambulatory Visit | Attending: Nurse Practitioner | Admitting: Nurse Practitioner

## 2017-10-05 DIAGNOSIS — M47816 Spondylosis without myelopathy or radiculopathy, lumbar region: Secondary | ICD-10-CM

## 2017-10-05 DIAGNOSIS — M545 Low back pain: Secondary | ICD-10-CM | POA: Diagnosis not present

## 2017-10-05 MED ORDER — IOPAMIDOL (ISOVUE-M 200) INJECTION 41%
1.0000 mL | Freq: Once | INTRAMUSCULAR | Status: AC
  Administered 2017-10-05: 1 mL via INTRA_ARTICULAR

## 2017-10-05 MED ORDER — METHYLPREDNISOLONE ACETATE 40 MG/ML INJ SUSP (RADIOLOG
120.0000 mg | Freq: Once | INTRAMUSCULAR | Status: AC
  Administered 2017-10-05: 120 mg via INTRA_ARTICULAR

## 2017-10-05 NOTE — Discharge Instructions (Signed)

## 2017-10-09 ENCOUNTER — Ambulatory Visit: Payer: PPO | Admitting: Pulmonary Disease

## 2017-10-09 ENCOUNTER — Encounter: Payer: Self-pay | Admitting: Pulmonary Disease

## 2017-10-09 ENCOUNTER — Other Ambulatory Visit (INDEPENDENT_AMBULATORY_CARE_PROVIDER_SITE_OTHER): Payer: PPO

## 2017-10-09 ENCOUNTER — Telehealth: Payer: Self-pay

## 2017-10-09 VITALS — BP 136/68 | HR 85 | Temp 98.2°F | Ht 72.0 in | Wt 285.2 lb

## 2017-10-09 DIAGNOSIS — M545 Low back pain, unspecified: Secondary | ICD-10-CM

## 2017-10-09 DIAGNOSIS — G4733 Obstructive sleep apnea (adult) (pediatric): Secondary | ICD-10-CM

## 2017-10-09 DIAGNOSIS — E349 Endocrine disorder, unspecified: Secondary | ICD-10-CM

## 2017-10-09 DIAGNOSIS — R7302 Impaired glucose tolerance (oral): Secondary | ICD-10-CM

## 2017-10-09 DIAGNOSIS — F341 Dysthymic disorder: Secondary | ICD-10-CM | POA: Diagnosis not present

## 2017-10-09 DIAGNOSIS — N320 Bladder-neck obstruction: Secondary | ICD-10-CM

## 2017-10-09 DIAGNOSIS — I10 Essential (primary) hypertension: Secondary | ICD-10-CM | POA: Diagnosis not present

## 2017-10-09 DIAGNOSIS — E782 Mixed hyperlipidemia: Secondary | ICD-10-CM | POA: Diagnosis not present

## 2017-10-09 DIAGNOSIS — M47816 Spondylosis without myelopathy or radiculopathy, lumbar region: Secondary | ICD-10-CM | POA: Diagnosis not present

## 2017-10-09 DIAGNOSIS — Z9989 Dependence on other enabling machines and devices: Secondary | ICD-10-CM

## 2017-10-09 DIAGNOSIS — M150 Primary generalized (osteo)arthritis: Secondary | ICD-10-CM

## 2017-10-09 DIAGNOSIS — J4520 Mild intermittent asthma, uncomplicated: Secondary | ICD-10-CM

## 2017-10-09 DIAGNOSIS — G8929 Other chronic pain: Secondary | ICD-10-CM

## 2017-10-09 DIAGNOSIS — M159 Polyosteoarthritis, unspecified: Secondary | ICD-10-CM

## 2017-10-09 LAB — COMPREHENSIVE METABOLIC PANEL
ALBUMIN: 4.4 g/dL (ref 3.5–5.2)
ALK PHOS: 67 U/L (ref 39–117)
ALT: 29 U/L (ref 0–53)
AST: 23 U/L (ref 0–37)
BUN: 21 mg/dL (ref 6–23)
CO2: 26 mEq/L (ref 19–32)
CREATININE: 1.05 mg/dL (ref 0.40–1.50)
Calcium: 9.6 mg/dL (ref 8.4–10.5)
Chloride: 103 mEq/L (ref 96–112)
GFR: 75.1 mL/min (ref >60.00)
Glucose, Bld: 101 mg/dL — ABNORMAL HIGH (ref 70–99)
Potassium: 4 mEq/L (ref 3.5–5.1)
SODIUM: 139 meq/L (ref 135–145)
TOTAL PROTEIN: 7.3 g/dL (ref 6.0–8.3)
Total Bilirubin: 0.8 mg/dL (ref 0.2–1.2)

## 2017-10-09 LAB — CBC WITH DIFFERENTIAL/PLATELET
Basophils Absolute: 0.1 10*3/uL (ref 0.0–0.1)
Basophils Relative: 0.6 % (ref 0.0–3.0)
Eosinophils Absolute: 0.2 10*3/uL (ref 0.0–0.7)
Eosinophils Relative: 1.9 % (ref 0.0–5.0)
HCT: 47 % (ref 39.0–52.0)
Hemoglobin: 16.1 g/dL (ref 13.0–17.0)
LYMPHS PCT: 16.3 % (ref 12.0–46.0)
Lymphs Abs: 1.8 10*3/uL (ref 0.7–4.0)
MCHC: 34.2 g/dL (ref 30.0–36.0)
MCV: 83.6 fl (ref 78.0–100.0)
MONOS PCT: 7.4 % (ref 3.0–12.0)
Monocytes Absolute: 0.8 10*3/uL (ref 0.1–1.0)
NEUTROS PCT: 73.8 % (ref 43.0–77.0)
Neutro Abs: 8.3 10*3/uL — ABNORMAL HIGH (ref 1.4–7.7)
Platelets: 275 10*3/uL (ref 150.0–400.0)
RBC: 5.62 Mil/uL (ref 4.22–5.81)
RDW: 14.3 % (ref 11.5–15.5)
WBC: 11.3 10*3/uL — ABNORMAL HIGH (ref 4.0–10.5)

## 2017-10-09 LAB — LIPID PANEL
Cholesterol: 158 mg/dL (ref 0–200)
HDL: 42.2 mg/dL (ref >39.00)
NonHDL: 115.92
Total CHOL/HDL Ratio: 4
Triglycerides: 240 mg/dL — ABNORMAL HIGH (ref 0.0–149.0)
VLDL: 48 mg/dL — ABNORMAL HIGH (ref 0.0–40.0)

## 2017-10-09 LAB — PSA: PSA: 3.46 ng/mL (ref 0.10–4.00)

## 2017-10-09 LAB — TSH: TSH: 1.85 u[IU]/mL (ref 0.35–4.50)

## 2017-10-09 LAB — LDL CHOLESTEROL, DIRECT: Direct LDL: 88 mg/dL

## 2017-10-09 MED ORDER — MELOXICAM 15 MG PO TABS: 15.0000 mg | ORAL_TABLET | Freq: Every day | ORAL | 5 refills | Status: DC | PRN

## 2017-10-09 MED ORDER — ALPRAZOLAM 0.5 MG PO TABS: ORAL_TABLET | ORAL | 5 refills | Status: DC

## 2017-10-09 NOTE — Patient Instructions (Signed)
Today we updated your med list in our EPIC system...    Continue your current medications the same...    We refilled the meds you requested...  Today we did your follow up FASTING blood work...    We will contact you w/ the results when available...   Keep up the good job w/ weight reduction...    DIET + EXERCISE are the keys to success...  Call for any questions...  Let's plan a follow up visit in 24mo, sooner if needed for problems.Marland KitchenMarland Kitchen

## 2017-10-09 NOTE — Telephone Encounter (Signed)
Copied from CRM [id number]. Topic: Inquiry >> Oct 09, 2017 12:48 PM Yvonna Alanis wrote: Reason for CRM: Patient sees Dr. Kriste Basque as his PCP. Dr. Kriste Basque will retire at the end of this year. Patient would like to know if Dr. Patsy Lager would start seeing him as his new PCP starting in May 2020. Dr. Patsy Lager was recommended to the patient by Dr. Kriste Basque. Please call patient and let him know if Dr. Rocky Morel become patient's New PCP.       Thank You!!!

## 2017-10-09 NOTE — Telephone Encounter (Signed)
My primary care practice has been closed for a very long time. No.

## 2017-10-09 NOTE — Progress Notes (Signed)
Subjective:    Patient ID: Dakota Ellison, male    DOB: [birth year], 67 y.o.   MRN: [id number]  HPI    67 y/o WM here for an add-on visit... he has multiple medical problems as noted below...   ~  SEE PREV EPIC NOTES FOR OLDER DATA >>    LABS 4/13:  FLP- at goals on Simva40;  Chems- ok w/ BS=106;  CBC- wnl;  TSH=2.20;  PSA=1.44  LABS 10/13:  Chems- ok w/ BS106...   ~  October 01, 2012:  48mo ROV & Darrold has an appt coming up w/ DrGerkin, [facility] to see about his inguinal hernias R>L; he is also interested in discussing poss Bariatric Surg;  He notes that his weight is about the same but he has been unable to lose;  Resting well w/ CPAP from [facility], BP remains controlled on hisExforgeHCT, Chol has been good on the Simva40, etc...     We reviewed prob list, meds, xrays and labs> see below for updates >>   LABS 4/14:  FLP- ok x TG=167 on Simva40 & needs better low fat diet & wt reduction;  Chems- wnl w/ BS=94 & A1c=5.9;  CBC- wnl;  TSH=2.39;  PSA=1.30...  ~  January 29, 2013:  46mo ROV & acute visit request add-on for puritic rash> states it started several weeks ago, red sl excoriated rash on legs/ feet/ ankles (including betw the toes), groin, one on penis, buttock, back, chest; very pruritic- esp at night & discrete macules/ papules are all c/w scabies...  Discussed w/ pt & rec treatment w/ KWELL lotion (wash, dry, apply for 12h & wash off, repeat process in 1wk if not completely resolved; also wrote for Atarax25mg  prn itching....    He saw DrGerkin 5/14> he felt the right inguinal hernia but could not find a recurrent  left sided hernia on exam (prev [facility] repair 1998); they did CT Pelvis- showed RIH containing fat & prev LIH repair w/o recurrence, min calcif atherosclerotic changes noted...     Dana saw [facility] 6/14 for colonoscopy> 2 sm sigmoid polyps removed, int hems, & we don't have path reports; pt told to f/u in 5-75yrs...    He had a f/u visit w/ DrDahlstedt 6/14> hypogonadism on testos shots  200mg /ml- getting 1ml every other week & Viagra prn; they checked UA, CBC, Prolactin, Testos level & PSA- we do not have their lab data... We reviewed prob list, meds, xrays and labs> see below for updates >>   ~  April 01, 2013:  19mo ROV & Xiomar requests Rx for new CPAP machine- his is broken- but studies & prev Rx per [facility] and we discussed this    OSA> he had an AHI=82 in 2010 & started on CPAP20 by Howell Rucks, [facility] & he continues follow up there (we do not have notes)...    Hx mild asthma> he still smokes an occas cigar & asked to quit completely!!! He states no attacks in yrs & breathing is OK, not on meds- denies CP, palpit, SOB, edema...    HBP> on ExforgeHCT & K+; BP today= 134/80 & similar at work; he remains asymptomatic...    Hyperlipid> on Simva40 + Niaspan; last FLP 4/14 looked ok; he understands that improvement here requires him to lose weight!    Obesity> wt= 307# which is up 7# over the interval; we reviewed diet/ exercise/ wt reduction program...    GI> GERD, Rectal fissure> on Prilosec20 & AnusolHC as needed; he saw [facility]  6/14 for colonoscopy> 2 sm sigmoid polyps removed, int hems, & we don't have path reports; pt told to f/u in 5-39yrs    GU> Low-T & ED>  He takes a bunch of vits & herbs including chromium, cinnamon, vinegar, MVI, etc; DrDahlstedt treats him w/ Testos shots & Viagra.    DJD> he uses OTC analgesics, Mobic15, and Vicodin as needed...    Depression> he remains on Zoloft25 & Xanax0.5 as needed... We reviewed prob list, meds, xrays and labs> see below for updates >> given 2014 Flu vaccine; he requests refill for his Vicodin & Xanax...  LABS 10/14:  Chems- ok x BS=113, A1c=6.0 (OK on diet alone)...   Art Dopplers of LEs done 02/27/14 were WNL- and walk test was neg w/o claudication...  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    Hx mild asthma> he still smokes an occas cigar & asked to quit completely!!! He states no attacks in yrs & breathing is OK, not on meds- denies CP, palpit, SOB, edema...    HBP> on ExforgeHCT (10-320-25); BP today= 130/78 & he remains asymptomatic w/o CP, palpit, ch in SOB, edema, etc...    Hyperlipid> on Atorva10 + Niaspan; FLP 5/17 showed TChol 138, TG 187, HDL 39, LDL 61; needs better low fat diet & weight loss!    Obesity> wt= 305# which is stable over the yrs- unable to lose wt effectively; we reviewed diet/ exercise/ wt reduction program...    GI> GERD, Rectal fissure> on Prilosec20 & AnusolHC as needed; he saw [facility] 6/14 for colonoscopy> 2 sm sigmoid polyps removed, int hems, & we don't have path reports; pt told to f/u in 5-37yrs    GU> Low-T & ED>  He takes a bunch of vits & herbs including garlic, cinnamon, vinegar, ginko, MVI, etc; DrDahlstedt treats him w/ Testos shots & Viagra.    DJD> he uses OTC analgesics, Tramadol50, and Vicodin as needed; needs TKRs but he's holding off, needs to lose wt...    Depression> he remains on Zoloft25 & Xanax0.5 as needed... EXAM shows Afeb, VSS, O2sat=96% on RA;  Wt=305# 6'Tall, BMI=41-2;  HEENT- parotid hypertrophy, mallampati2, no bruits;  Chest- clear w/o w/r/r;  Heart- RR w/o m/r/g;  Abd- obese, soft, sl panniculus; Ext- DJD knees, w/o c/c/e...  CXR 10/08/15>  Norm heart size, clear lungs, NAD...  LABS 10/08/15>  FLP- at goals on Lip10 but elev TG & needs better diet/ exerc/ get wt down;  Chems- ok w/ BS=95, A1c=6.2;  CBC- wnl;  TSH=1.59;  PSA=1.10;  UA- clear IMP/PLAN>>  Fredi returns for a CPX &  med follow-up;  Doing reasonably well but same old story- must get on diet & get wt down (reviewed w/ pt);  Due for pneumonia vaccines- we gave PREVNAR-13 today;  Rx written for shingles vaccine;  Refills written for 90d supplies of Alpraz and Tramadol- continue same meds, get on diet,  get wt down...   ~  April 11, 2016:  28mo ROV w/ SN>  Ugonna returns and notes recent dental problems w/ infection & root canal;  He has been seeing ORTHO for his knee, getting shots, but told he needs TKR;  He has also been seeing NS-DrRoy for back pain but we do not have notes from these specialists to review...     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     OSA> he had an AHI=82 in 2010 & started on CPAP20 by Howell Rucks, [facility] & he has had sleep f/u by DrClance=> DrYoung seen 07/27/15 and stable.    Hx mild asthma> he still smokes an occas cigar & asked to quit completely!!! He states no attacks in yrs & breathing is OK, not on meds- denies CP, palpit, SOB, edema...    HBP> on ExforgeHCT (10-320-25); BP today= 128/80 & he remains asymptomatic w/o CP, palpit, ch in SOB, edema, etc...    Hyperlipid> on Atorva10 + Niaspan; FLP 5/17 showed TChol 138, TG 187, HDL 39, LDL 61; needs better low fat diet & weight loss!    Obesity> wt= 297# which is down8#- prev unable to lose wt effectively; we reviewed diet/ exercise/ wt reduction program...    GI> GERD, Rectal fissure> on Prilosec20 & AnusolHC as needed; he saw [facility] 6/14 for colonoscopy> 2 sm sigmoid polyps removed, int hems, & we don't have path reports; pt told to f/u in 5-21yrs    GU> Low-T & ED>  He takes a bunch of vits & herbs including garlic, cinnamon, vinegar, ginko, MVI, etc; DrDahlstedt treats him w/ Testos shots & Viagra.    DJD> he uses OTC analgesics, Tramadol50, and Vicodin as needed; needs TKRs but he's holding off, needs  to lose wt...    Depression> he remains on Zoloft25 & Xanax0.5 as needed... EXAM shows Afeb, VSS, O2sat=96% on RA;  Wt=305# 6'Tall, BMI=41-2;  HEENT- parotid hypertrophy, mallampati2, no bruits;  Chest- clear w/o w/r/r;  Heart- RR w/o m/r/g;  Abd- obese, soft, sl panniculus; Ext- DJD knees, w/o c/c/e...  EKG 03/28/16 by Stormy Card showed NSR, rate97, wnl- NAD... IMP/PLAN>>  Haylen is overall stable w/ dental issues + Ortho (knee) and Neuro (back pain);  meds refilled and he will continue to f/u w/ the specalists and aske for notes to be sent her for epic... NOTE: >50% of this rov was spent in counseling 7 coordination of care...  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  We reviewed the following medical problems during today's office visit >>     OSA> he had an AHI=82 in 2010 & started on CPAP20 by Howell Rucks, [facility] & he has had sleep f/u by DrClance=> DrYoung seen 07/2016 and stable.    Hx mild asthma> he still smokes an occas cigar & asked  to quit completely!!! He states no attacks in yrs & breathing is OK, not on meds- denies CP, palpit, SOB, edema...    HBP> on ExforgeHCT (10-320-25); BP today= 118/76 & he remains asymptomatic w/o CP, palpit, ch in SOB, edema, etc...    Hyperlipid> on Crestor10 + Niaspan; FLP 5/18 showed TChol 142, TG 219, HDL 44, LDL 74; needs better low fat diet & weight loss!    Obesity> wt= 290# which is down7#- prev unable to lose wt effectively; we reviewed diet/ exercise/ wt reduction program...    GI>  GERD, Rectal fissure> on Prilosec20 & AnusolHC as needed; [facility] for colonoscopy> 2 sm sigmoid polyps removed, int hems, & we don't have path reports; pt told to f/u in 5-89yrs    GU> Low-T & ED>  He takes a bunch of vits & herbs including garlic, cinnamon, vinegar, ginko, MVI, etc; DrDahlstedt treats him w/ Testos shots (200 Q2wks) & Viagra prn; PSAs and Hg have been wnl...    DJD> he uses OTC analgesics, Tramadol50=> now Vicodin prn per [facility]; needs TKRs but he's holding off, needs to lose wt...    Depression> he remains on Zoloft25 & Xanax0.5 as needed... EXAM shows Afeb, VSS, O2sat=95% on RA;  Wt=290# 6'Tall, BMI=39-40;  HEENT- parotid hypertrophy, mallampati2, no bruits;  Chest- clear w/o w/r/r;  Heart- RR w/o m/r/g;  Abd- obese, soft, sl panniculus; Ext- DJD knees, w/o c/c/e...  LABS 10/10/16>  Chol is ok on Cres10 but TG elev at 219- needs better diet;  Chems- wnl;  CBC- wnl (Hg=17.3);  TSH=1.32;  PSA=1.53;  VitD=43... IMP/PLAN>>  Krishna is stable w/ his mult medical issues- continue CPAP, same meds, get on diet w/ wt reduction;  DrRoy prescribes Vicodin for pain & pt is cautioned;  DrDahstedt prescribes Testos shots & checks his PSAs and Hg... Continue samemeds for now...  ~  April 17, 2017:  6 month ROV & general medical follow up visit>  We reviewed the following interval notes in Epic>      He saw UROLOGY- DrDahlstedt on 02/20/17>  Low-T on Testos shots 200mg  Q2wks + Viagra100 prn; Labs done 02/2017 showed Hg=16.8, Hct=51.8, PSA=1.18, Testos=352; continue same rx...    He saw NS- DrRoy on 02/20/17>  Saw BMarshall,NP for his chr lumbar pain- good days & bad, activities makes it worse, Dx w/ Lumbar spondylosis & Rx w/ Vicodin5/325 for prn use...     He saw [facility]- DrGollan 03/27/17>  Hx HBP, HL, obesity, OSA; denies CP, palpit, dizzy, edema; works farm Facilities manager; BP controlled  On Exforge (Amlod10/ Valsar320/ HCT25); HL improved on Cres10 + Niacin500-2/d but he c/o myalgias  therefore decr to 5mg /d;   We reviewed the following medical problems during today's office visit>      OSA> he had an AHI=82 in 2010 & started on CPAP20 by Howell Rucks, [facility] & he has had sleep f/u by DrClance=> DrYoung seen 07/2016 and stable.    Hx mild asthma> he still smokes an occas cigar (says he quit the cigars too);  He states no attacks in yrs & breathing is OK, not on meds- denies CP, palpit, SOB, edema...    HBP> on ExforgeHCT (10-320-25); BP today= 126/68 & he remains asymptomatic w/o CP, palpit, ch in SOB, edema, etc...    Hyperlipid> on Crestor10 + Niaspan; c/o myalgias on the Cres10- FLP 5/18 showed TChol 142, TG 219, HDL 44, LDL 74; needs better low fat diet & weight loss!  Obesity> wt= 297# which is back up 7#- prev unable to lose wt effectively; we reviewed diet/ exercise/ wt reduction program...    GI> GERD, Rectal fissure> on Prilosec20 & AnusolHC as needed; he saw [facility] 6/14 for colonoscopy> 2 sm sigmoid polyps removed, int hems, & we don't have path reports; pt told to f/u in 5-73yrs    Right inguinal hernia>  Prev referred to [facility]- DrGerkin but pt didn't go, says he had prev hernia surg & didn't want to do it again...     GU> Low-T & ED>  He takes a bunch of vits & herbs including garlic, cinnamon, vinegar, ginko, MVI, etc; DrDahlstedt treats him w/ Testos shots (200 Q2wks) & Viagra prn; PSAs and Hg have been wnl...    DJD> he uses OTC analgesics, Tramadol50=> now Vicodin prn per [facility]; needs TKRs but he's holding off, needs to lose wt.; Add Meloxicam  daily if needed...    Depression> he remains on Zoloft25 & Xanax0.5 as needed... EXAM shows Afeb, VSS, O2sat=95% on RA;  Wt=290# 6'Tall, BMI=39-40;  HEENT- parotid hypertrophy, mallampati2, no bruits;  Chest- clear w/o w/r/r;  Heart- RR w/o m/r/g;  Abd- obese, soft, sl panniculus; Ext- DJD knees, w/o c/c/e...  LABS from [facility]> done 02/20/17-- Hg=16.8, Hct=51.8, PSA=1.18, Testos=352... IMP/PLAN>>  Izaya is overall  stable- we added Meloxicam /d as a trial to see if this helps;  He is tol the [employer] but still needs a better diet w/ wt reduction & low fat;  We gave him the Pneumovax23 vaccine today & plan rov recheck in 80mo w/ fasting labs...    ~  Oct 09, 2017:  80mo ROV & Yuya reports another stable interval- his CC remains his orthopedic issues w/ knees bone-on-bone & need for TKRs but he is holding off;  He still sees DrMRoy, NS in [place] for back pain and he serves as his pain doctor writing Hydrocodone monthly...  We reviewed the following interval notes in Epic>      He saw UROLOGY- DrDahlstedt on 06/14/17>  Followed for low-T on testos cypionate 200 Q2wks, f/u PSA, testos level & Hgb have been nromal; also takes sildenafil for ED...     He saw SLEEP- DrYoung 07/31/17>  He had NPSG 2010 showing AHI=82/hr, on Auto CPAP 10-20, download showed good compliance but resid AHI=11, improved to 4, asked to work on weight reduction...     He saw DrRoy's NP-BMarshall 09/11/17>  81mo rov for lumbar spondylosis & facet arthropathy; note indicates Norco rx 1-2tabs Q4-6H w/ #180 written but pt reports that this is not completely relieving his pain; denies radiation down legs or weakness; also takes cyclobenzaprine & they ordered R+L L4-5 facet injections per [facility] (done 10/06/27 by DrCurnes)... We reviewed the following medical problems during today's office visit>      OSA & mild asthma> on CPAP w/ good compliance & not requiring bronchdilators since he's quit the cigar smoking...     HBP, HL, Obesity> he understands cardiac risk factors-- BP controlled on ExforgeHCT (10-320-25), Lipids at goals on Crestor10+Niaspan but he STOPPED the Crestor due to legs aching, now on ZETIA10+NIACIN500Bid per [facility]- DrGollan (FLP 10/2017- TChol 158, TG 240, HDL 42, LDL 88), still struggles w/ diet & weight...    GI- GERD, rectal fissure, RIH-- on Prilosec20 & AnusolHC as needed, he has refused surg consult for [facility] repair...    GU- as  above per DrDahlstedt...    Ortho- per DrAlusio & DrMRoy... EXAM shows Afeb,  VSS, O2sat=97% on RA;  Wt=285# 6'Tall, BMI=39;  HEENT- parotid hypertrophy, mallampati2, no bruits;  Chest- clear w/o w/r/r;  Heart- RR w/o m/r/g;  Abd- obese, soft, sl panniculus; Ext- DJD knees, w/o c/c/e...  LABS 10/09/17>  FLP- chol ok on Cres10 but TG=240 & must diet/ get weight down;  Chems- ok w/ BS=101;  CBC- wnl;  TSH=1.85;  PSA=3.46... IMP/PLAN>>  Problems as above, continue same meds, try to slowly wean off narcotic analgesics per DrRoy/ pain management, diet/ exercise/ wt reduction is most important...           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 it got worse and was sent to DrWolicki who hospitalized him 3/08 w/ MRSA infection- Rx's w/ Vancomycin infusions... now resolved.  OBSTRUCTIVE SLEEP APNEA (ICD-327.23) - Apr10> had Sleep Study at [facility]> pos for OSA w/ AHI= 82, desat to 85%... split night study w/ good control on CPAP 20 (see report)... started on CPAP by [facility] & still followed by him...  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 no recent exac and no regular meds... told to discontinue all smoking> denies much cough, sputum, dyspnea, CP, etc... ~  CXR 2/09 showed normal heart size, clear lungs, degen changes in Tspine, NAD...   HYPERTENSION (ICD-401.9) - followed by DrGollan/ [facility]> on ECASA 325mg /d & EXFORGE 10/320/25 daily + KCl 17mEq/d... he had CP while hosp w/ the MRSA 2008 and DrWolicki consulted DrMcQueen who did a NuclearStressTest 3/08 which was normal... and a 2DEcho which showed mild MR and nl LVF... ~ 10/12:  f/u visit DrGollan at [facility]> note reviewed, no changes made, DrGollan desperately wants him to lose weight! ~  10/12:  BP= 124/76 doing well, remains asymptomatic... ~  4/13:  BP= 120/78 & he denies CP, palpit, SOB, edema,  etc... ~  10/13:  BP= 118/76 & he remains asymptomatic; EKG showed NSR, rate86, wnl, NAD... ~  4/14:  BP= 134/80 & he denies CP, palpit, dizzy, SOB, edema... ~  8/14:  BP= 120/82 & he remains asymptomatic...  HYPERLIPIDEMIA (ICD-272.4) - he's been switched around by [facility] betw Simva/ Caduet/ back to SIMVASTATIN 40mg /d now + NIACIN 500mg Bid + FISH OIL 1000mg Bid...  still not on any kind of diet but his weight is stable at 294#.Marland Kitchen. ~  FLP 1/08 showed TChol 195, TG 119, HDL 41, LDL 131 ~  FLP 2/09 showed TChol 214, TG 214, HDL 39, LDL 127 ~  12/09: recent labs by [facility]- we don't have these results... ~  FLP 5/10 on 3 meds above showed TChol 148, TG 81, HDL 48, LDL 84 ~  FLP 11/10 on 3 meds showed TChol 135, TG 145, HDL 36, LDL 70 ~  FLP 5/11 showed TChol 145, TG 128, HDL 43, LDL 77... keep same Rx. ~  FLP 10/11 showed TChol 152, TG 198, HDL 41, LDL 72... same meds, better diet, get wt down! ~  FLP 4/12 showed TChol 141, TG 200, HDL 39, LDL 62... Similar> needs better low fat diet, get wt down. ~  10/12:  FLP not rechecked today as he has not lost wt etc... ~  FLP 4/13 on Simva40+Niasp1000 showed TChol 137, TG 124, HDL 43, LDL 69 ~  FLP 4/14 on Simva40+Niasp1000 showed TChol 128, TG 167, HDL 35, LDL 59  MORBID OBESITY (ICD-278.01) - despite all efforts weight remains >300#... discussed wt watchers, nutrition  center, poss bariatric approach... he's not been able to exercise but will join the Y & start diet program! ~  5/11:  he reports that DrElkins gave him 30d supply of Phenteramine which really helped- lost 20#, but put it all back + 9# more off this med... we discussed the dangers of diet pills & rec diet + exercise, neither of which he has tried in earnest! ~  10/11:  weight up 15# to 314# today> discussed diet, exercise, etc... ~  4/12:  Weight = 309# and needs to do better w/ diet/ exercise... ~  10/12:  Weight = 314# and we reviewed diet, exercise, wt loss program... ~  4/13:  Weight = 311# ~   10/13:  Weight = 322# ~  4/14:  Weight = 320# ~  8/14:  Weight = 307#  GERD (ICD-530.81) - he had an EGD from [facility] 4/03 which was neg... he takes OMEPRAZOLE 20mg /d for reflux symptoms which works well...  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 prev tried Diltiazem cream, now ANUSOL HC etc... COLON POLYPS >>  ~  pt reports colonoscopy by [facility] 7/03- reported neg & f/u planned 5 yrs> he is overdue & will call for f/u exam... ~  [place] saw [facility] 6/14 for colonoscopy> 2 sm sigmoid polyps removed, int hems, & we don't have path reports; pt told to f/u in 5-46yrs...  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 also takes Viagra Prn...  ~  labs 5/10 showed PSA = 0.85 ~  labs 5/11 showed PSA= 0.65 ~  Labs 4/12 showed PSA= 0.79 ~  Labs 4/13 showed PSA= 1.44 ~  9/13: seen by DrDahlstedt> treated for hypogonadism w/ Testopel, then Testos shots but he was irregular w/ his visits; prostate exam was wnl; they tried to teach him self injected Testos Q2weeks...  ~  Labs 4/14 showed PSA= 1.30 ~  He had a f/u visit w/ DrDahlstedt 6/14> hypogonadism on testos shots 200mg /ml- getting 1ml every other week & Viagra prn; they checked UA, CBC, Prolactin, Testos level & PSA- we do not have their lab data.   OSTEOARTHRITIS (ICD-715.90) DEGENERATIVE DISC DISEASE (ICD-722.6) - followed by DrMurphy for right knee pain... prev seen by Mayford Knife for DDD and LBP... ~  s/p left CTS surg 2009 ~  s/p right knee arthroscopy by DrMurphy 2/10... also had cortisone shot in knee 4/10 ~  s/p trigger finger injections by DrGramig ~  4/12:  s/p left knee arthroscopy by DrMurphy> improved...  DYSTHYMIA (ICD-300.4) - some stress, some depression which is mild by  his determination... he tried Lexapro last year but didn't follow up... now using Xanax as needed, and started ZOLOFT 25mg /d in Feb10- he reports better, wants to continue this dose.  HEALTH MAINTENANCE: ~  GI:  He saw [facility] 6/14 for f/u colonoscopy w/ 2 polyps removed- we don't have path. ~  GU:  Followed by DrDahlstedt as above... ~  Immuniz:  He gets Flu shot each yr at work; he had TDAP at work 2013...   Past Surgical History  Procedure Date  . Sphincterotomy   . Shoulder arthroscopy     right  . Other surgical history     arthroscopic subacromial decompression  . Other surgical history  open resection, distal right clavicle  . Knee surgery 09/2010    left knee    =>  s/p left knee arthroscopy 4/12 by DrMurphy      Outpatient Encounter Medications as of 10/09/2017  Medication Sig  . ALPRAZolam (XANAX) 0.5 MG tablet TAKE 1 TABLET BY MOUTH THREE TIMES DAILYAS NEEDED FOR ANXIETY  . Amlodipine-Valsartan-HCTZ 10-320-25 MG TABS Take 1 tablet by mouth daily.  . APPLE CIDER VINEGAR PO Take by mouth daily.  Marland Kitchen aspirin 81 MG tablet Take 81 mg by mouth daily.    Marland Kitchen CINNAMON PO Take 200 mg by mouth daily.  . Coenzyme Q10 (CO Q 10 PO) Take by mouth daily.  Marland Kitchen ezetimibe (ZETIA) 10 MG tablet Take 1 tablet (10 mg total) by mouth daily.  Marland Kitchen GARLIC PO Take 0,254 mg by mouth daily.  . Ginkgo Biloba 120 MG CAPS Take 120 mg by mouth daily.   Marland Kitchen HYDROcodone-acetaminophen (NORCO/VICODIN) 5-325 MG tablet Take 1 tablet by mouth every 6 (six) hours as needed for moderate pain. Given by Dr. Channing Mutters  . hydrocortisone (PROCTOSOL HC) 2.5 % rectal cream Apply as directed after each BM   . meloxicam (MOBIC) 15 MG tablet Take 1 tablet (15 mg total) by mouth daily as needed for pain.  . Multiple Vitamin (MULTIVITAMIN) tablet Take 1 tablet by mouth daily.    . niacin 500 MG CR capsule Take 500 mg by mouth 2 (two) times daily.   Marland Kitchen omeprazole (PRILOSEC) 20 MG capsule Take 1 capsule (20 mg total) by mouth daily.  .  sertraline (ZOLOFT) 25 MG tablet TAKE 1 TABLET (25 MG TOTAL) BY MOUTH DAILY.  . sildenafil (VIAGRA) 100 MG tablet Take as directed  . testosterone enanthate (DELATESTRYL) 200 MG/ML injection Inject into the muscle every 14 (fourteen) days. For IM use only  . [DISCONTINUED] ALPRAZolam (XANAX) 0.5 MG tablet TAKE 1 TABLET BY MOUTH THREE TIMES DAILYAS NEEDED FOR ANXIETY  . [DISCONTINUED] levofloxacin (LEVAQUIN) 500 MG tablet Take 1 tablet (500 mg total) by mouth daily.  . [DISCONTINUED] meloxicam (MOBIC) 15 MG tablet Take 1 tablet (15 mg total) daily as needed by mouth for pain.  . [DISCONTINUED] methylPREDNISolone (MEDROL) 4 MG TBPK tablet Take as directed per packet instructions   No facility-administered encounter medications on file as of 10/09/2017.     Allergies  Allergen Reactions  . Atorvastatin Other (See Comments)    Muscle weakness in his legs  . Crestor [Rosuvastatin] Other (See Comments)    Severe muscle cramping  . Zocor [Simvastatin] Other (See Comments)    Muscle weakness in his legs    Immunization History  Administered Date(s) Administered  . Influenza Split 03/25/2011, 03/23/2012, 03/18/2013  . Influenza Whole 03/06/2009, 03/08/2010  . Influenza,inj,Quad PF,6+ Mos 03/06/2014, 03/28/2016  . Influenza-Unspecified 03/21/2015, 03/09/2017  . Pneumococcal Conjugate-13 10/08/2015  . Pneumococcal Polysaccharide-23 04/17/2017  . Tdap 03/07/2011  . Zoster 10/23/2015    Current Medications, Allergies, Past Medical History, Past Surgical History, Family History, and Social History were reviewed in [employer] record.    Review of Systems         See HPI - all other systems neg except as noted... The patient complains of dyspnea on exertion.  The patient denies anorexia, fever, weight loss, weight gain, vision loss, decreased hearing, hoarseness, chest pain, syncope, peripheral edema, prolonged cough, headaches, hemoptysis, abdominal pain, melena,  hematochezia, severe indigestion/heartburn, hematuria, incontinence, muscle weakness, suspicious skin lesions, transient blindness, difficulty walking, depression, unusual weight change, abnormal  bleeding, enlarged lymph nodes, and angioedema.     Objective:   Physical Exam     WD, Obese, 67 y/o WM in NAD... GENERAL:  Alert & oriented; pleasant & cooperative. HEENT:  [place]/AT, EOM-wnl, PERRLA, EACs-clear, TMs-wnl, NOSE-clear, THROAT-clear & wnl. NECK:  Supple w/ fairROM; no JVD; normal carotid impulses w/o bruits; no thyromegaly or nodules palpated; no lymphadenopathy. CHEST:  Clear to P & A; without wheezes/ rales/ or rhonchi. HEART:  Regular Rhythm; gr 1/6 SEM, without rubs or gallops... ABDOMEN:  Obese soft & nontender; normal bowel sounds; no organomegaly or masses detected.  EXT: without deformities, mild arthritic changes; no varicose veins/ +venous insuffic/ no edema.right knee w/ ace wrap.  NEURO:  CN's intact; no focal neuro deficits... DERM: diffuse sl excoriated papular rash on legs, ankles, feet, groin, buttocks, back, chest> c/w scabies...  RADIOLOGY DATA:  Reviewed in the EPIC EMR & discussed w/ the patient...  LABORATORY DATA:  Reviewed in the EPIC EMR & discussed w/ the patient...   Assessment & Plan:    57/18>   Jahaziel is stable w/ his mult medical issues- continue CPAP, same meds, get on diet w/ wt reduction;  DrRoy prescribes Vicodin for pain & pt is cautioned;  DrDahstedt prescribes Testos shots & checks his PSAs and Hg... Continue samemeds for now. 04/17/17>   Geramy is overall stable- we added Meloxicam 15mg /d as a trial to see if this helps;  He is tol the [employer] but still needs a better diet w/ wt reduction & low fat;  We gave him the Pneumovax23 vaccine today & plan rov recheck in 2mo w/ fasting labs. 10/09/17>   Problems as above, continue same meds, try to slowly wean off narcotic analgesics per DrRoy/ pain management, diet/ exercise/ wt reduction is most  important.   OSA>  On CPAP per DrYoung now & stable, good compliance, etc...  Asthma>  He has decr cigars but still needs to quit, we discussed smoking cessation but he doesn't want med help etc...  HBP>  Controlled on the ExforgeHCT Rx, he likes the one pill;  discussed the need to lose wt, no salt, etc...  Hyperlipidemia>  FLPs reviewed> if he wants to improve these numbers MUST get on diet & get wt down!!!  OBESITY>  As above!!!  GU>  Per DrDahlstedt on Testos shots & Viagra...  DJD>  S/p arthroscopic surg left knee by DrMurphy but told he needs TKR & holding off...   Patient's Medications  New Prescriptions   No medications on file  Previous Medications   AMLODIPINE-VALSARTAN-HCTZ 10-320-25 MG TABS    Take 1 tablet by mouth daily.   APPLE CIDER VINEGAR PO    Take by mouth daily.   ASPIRIN 81 MG TABLET    Take 81 mg by mouth daily.     CINNAMON PO    Take 200 mg by mouth daily.   COENZYME Q10 (CO Q 10 PO)    Take by mouth daily.   EZETIMIBE (ZETIA) 10 MG TABLET    Take 1 tablet (10 mg total) by mouth daily.   GARLIC PO    Take 2,000 mg by mouth daily.   GINKGO BILOBA 120 MG CAPS    Take 120 mg by mouth daily.    HYDROCODONE-ACETAMINOPHEN (NORCO/VICODIN) 5-325 MG TABLET    Take 1 tablet by mouth every 6 (six) hours as needed for moderate pain. Given by Dr. Channing Mutters   HYDROCORTISONE (PROCTOSOL HC) 2.5 % RECTAL CREAM    Apply  as directed after each BM    MULTIPLE VITAMIN (MULTIVITAMIN) TABLET    Take 1 tablet by mouth daily.     NIACIN 500 MG CR CAPSULE    Take 500 mg by mouth 2 (two) times daily.    OMEPRAZOLE (PRILOSEC) 20 MG CAPSULE    Take 1 capsule (20 mg total) by mouth daily.   SERTRALINE (ZOLOFT) 25 MG TABLET    TAKE 1 TABLET (25 MG TOTAL) BY MOUTH DAILY.   SILDENAFIL (VIAGRA) 100 MG TABLET    Take as directed   TESTOSTERONE ENANTHATE (DELATESTRYL) 200 MG/ML INJECTION    Inject into the muscle every 14 (fourteen) days. For IM use only  Modified Medications   Modified  Medication Previous Medication   ALPRAZOLAM (XANAX) 0.5 MG TABLET ALPRAZolam (XANAX) 0.5 MG tablet      TAKE 1 TABLET BY MOUTH THREE TIMES DAILYAS NEEDED FOR ANXIETY    TAKE 1 TABLET BY MOUTH THREE TIMES DAILYAS NEEDED FOR ANXIETY   MELOXICAM (MOBIC) 15 MG TABLET meloxicam (MOBIC) 15 MG tablet      Take 1 tablet (15 mg total) by mouth daily as needed for pain.    Take 1 tablet (15 mg total) daily as needed by mouth for pain.  Discontinued Medications   LEVOFLOXACIN (LEVAQUIN) 500 MG TABLET    Take 1 tablet (500 mg total) by mouth daily.   METHYLPREDNISOLONE (MEDROL) 4 MG TBPK TABLET    Take as directed per packet instructions

## 2017-10-10 DIAGNOSIS — M170 Bilateral primary osteoarthritis of knee: Secondary | ICD-10-CM | POA: Diagnosis not present

## 2017-10-10 DIAGNOSIS — M1711 Unilateral primary osteoarthritis, right knee: Secondary | ICD-10-CM | POA: Diagnosis not present

## 2017-10-10 DIAGNOSIS — M1712 Unilateral primary osteoarthritis, left knee: Secondary | ICD-10-CM | POA: Diagnosis not present

## 2017-10-10 NOTE — Telephone Encounter (Signed)
No answer, unable to leave a message, will try again later. Made CRM also about this-Anastasiya V Hopkins, RMA

## 2017-10-12 ENCOUNTER — Telehealth: Payer: Self-pay | Admitting: Pulmonary Disease

## 2017-10-12 NOTE — Telephone Encounter (Signed)
lmtcb-AH

## 2017-10-12 NOTE — Telephone Encounter (Signed)
Spoke with SN and he called pt yesterday. I will place on his cart to call pt.

## 2017-10-13 NOTE — Telephone Encounter (Signed)
Result Notes for LDL cholesterol, direct   Notes recorded by Michele Mcalpine, MD on 10/12/2017 at 2:14 PM EDT I called pt & LMOM> [facility]N FLP is ok on Zetia/ Niaci- chol numbers look good, however TG sl elev & rec better low fat diet, get wt down & consider adding FISH OIL capsule daily... Chems, CBC, Thyroid, PSA are all wnl... PSA is up to 3.46 & bears watching so we wll recheck in 63mo...   lmtcb

## 2017-10-16 NOTE — Telephone Encounter (Signed)
Pt PCP, Dr. Kriste Basque is retiring at the end of the year and pt is to transfer care to Dr. Para March, starting 10/2018. OK to transfer care?

## 2017-10-16 NOTE — Telephone Encounter (Signed)
Left message for patient to call back for results.  

## 2017-10-17 NOTE — Telephone Encounter (Signed)
ATC pt, no answer. Left message for pt to call back.  

## 2017-10-17 NOTE — Telephone Encounter (Signed)
Pt returning call CB # [phone], pt said it is okay to leave message about results he doesn't mind.

## 2017-10-17 NOTE — Telephone Encounter (Signed)
I do not think I can take on extra transfer patients at this point due to my current patient volume.

## 2017-10-17 NOTE — Telephone Encounter (Signed)
Spoke with pt, aware of results/recs.  Nothing further needed.  

## 2017-10-17 NOTE — Telephone Encounter (Signed)
Sending letter to the patient letting him know Dr Patsy Lager not able to accept patient and offer appointments with NP or new doctor that is coming in the [employer], RMA

## 2017-10-23 NOTE — Telephone Encounter (Signed)
I left msg for pt advising him Dr. Para March is not taking new pts and he can call and set up an appt with NP's Russella Dar or [facility] or Dr Alphonsus Sias.

## 2017-11-03 DIAGNOSIS — G4733 Obstructive sleep apnea (adult) (pediatric): Secondary | ICD-10-CM | POA: Diagnosis not present

## 2017-11-14 ENCOUNTER — Other Ambulatory Visit: Payer: Self-pay | Admitting: Pulmonary Disease

## 2017-11-14 ENCOUNTER — Telehealth: Payer: Self-pay | Admitting: *Deleted

## 2017-11-14 NOTE — Telephone Encounter (Signed)
Faxed CPAP supply order for supplies ordered 11/01/17 to [facility].

## 2017-11-18 ENCOUNTER — Other Ambulatory Visit: Payer: Self-pay | Admitting: Pulmonary Disease

## 2017-11-20 NOTE — Telephone Encounter (Signed)
Pt is requesting refill on prilosec.  Rx last refilled by Dr. Mariah MillingGollan on 08/29/17. SN last refilled prilosec 12/06/17. I do not see this medication mentioned within last OV note.  SN please advise if okay to refill. Thanks  Current Outpatient Medications on File Prior to Visit  Medication Sig Dispense Refill  . ALPRAZolam (XANAX) 0.5 MG tablet TAKE 1 TABLET BY MOUTH THREE TIMES DAILYAS NEEDED FOR ANXIETY 90 tablet 5  . Amlodipine-Valsartan-HCTZ 10-320-25 MG TABS Take 1 tablet by mouth daily. 90 tablet 4  . APPLE CIDER VINEGAR PO Take by mouth daily.    Marland Kitchen. aspirin 81 MG tablet Take 81 mg by mouth daily.      Marland Kitchen. CINNAMON PO Take 200 mg by mouth daily.    . Coenzyme Q10 (CO Q 10 PO) Take by mouth daily.    Marland Kitchen. ezetimibe (ZETIA) 10 MG tablet Take 1 tablet (10 mg total) by mouth daily. 90 tablet 3  . GARLIC PO Take 2,9562,000 mg by mouth daily.    . Ginkgo Biloba 120 MG CAPS Take 120 mg by mouth daily.     Marland Kitchen. HYDROcodone-acetaminophen (NORCO/VICODIN) 5-325 MG tablet Take 1 tablet by mouth every 6 (six) hours as needed for moderate pain. Given by Dr. Channing Muttersoy    . hydrocortisone (PROCTOSOL HC) 2.5 % rectal cream Apply as directed after each BM     . meloxicam (MOBIC) 15 MG tablet Take 1 tablet (15 mg total) by mouth daily as needed for pain. 90 tablet 5  . Multiple Vitamin (MULTIVITAMIN) tablet Take 1 tablet by mouth daily.      . niacin 500 MG CR capsule Take 500 mg by mouth 2 (two) times daily.     Marland Kitchen. omeprazole (PRILOSEC) 20 MG capsule Take 1 capsule (20 mg total) by mouth daily. 90 capsule 3  . sertraline (ZOLOFT) 25 MG tablet TAKE 1 TABLET (25 MG TOTAL) BY MOUTH DAILY. 90 tablet 2  . sildenafil (VIAGRA) 100 MG tablet Take as directed 10 tablet 5  . testosterone enanthate (DELATESTRYL) 200 MG/ML injection Inject into the muscle every 14 (fourteen) days. For IM use only     No current facility-administered medications on file prior to visit.     Allergies  Allergen Reactions  . Atorvastatin Other (See  Comments)    Muscle weakness in his legs  . Crestor [Rosuvastatin] Other (See Comments)    Severe muscle cramping  . Zocor [Simvastatin] Other (See Comments)    Muscle weakness in his legs

## 2017-11-22 ENCOUNTER — Telehealth: Payer: Self-pay | Admitting: Pulmonary Disease

## 2017-11-22 MED ORDER — OMEPRAZOLE 20 MG PO CPDR
20.0000 mg | DELAYED_RELEASE_CAPSULE | Freq: Every day | ORAL | 3 refills | Status: DC
Start: 2017-11-22 — End: 2017-11-24

## 2017-11-22 NOTE — Telephone Encounter (Signed)
Pt is requesting refill on Prilosec.  Rx for proilosec has been sent to preferred pharmacy. Nothing further is needed.

## 2017-11-24 ENCOUNTER — Telehealth: Payer: Self-pay | Admitting: Cardiovascular Disease

## 2017-11-24 ENCOUNTER — Other Ambulatory Visit: Payer: Self-pay | Admitting: *Deleted

## 2017-11-24 MED ORDER — OMEPRAZOLE 20 MG PO CPDR: 20.0000 mg | DELAYED_RELEASE_CAPSULE | Freq: Every day | ORAL | 1 refills | Status: DC

## 2017-11-24 NOTE — Telephone Encounter (Signed)
Requested Prescriptions   Signed Prescriptions Disp Refills  . omeprazole (PRILOSEC) 20 MG capsule 90 capsule 1    Sig: Take 1 capsule (20 mg total) by mouth daily.    Authorizing Provider: GOLLAN, TIMOTHY J    Ordering User: Tenea Sens C    

## 2017-11-24 NOTE — Telephone Encounter (Signed)
°*  STAT* If patient is at the pharmacy, call can be transferred to refill team.   1. Which medications need to be refilled? (please list name of each medication and dose if known) omeprazole   2. Which pharmacy/location (including street and city if local pharmacy) is medication to be sent to? [facility] in  [place]   3. Do they need a 30 day or 90 day supply? 90 day

## 2017-11-24 NOTE — Telephone Encounter (Signed)
Requested Prescriptions   Signed Prescriptions Disp Refills  . omeprazole (PRILOSEC) 20 MG capsule 90 capsule 1    Sig: Take 1 capsule (20 mg total) by mouth daily.    Authorizing Provider: Antonieta IbaGOLLAN, TIMOTHY J    Ordering User: Kendrick FriesLOPEZ, MARINA C

## 2017-12-11 DIAGNOSIS — Z79891 Long term (current) use of opiate analgesic: Secondary | ICD-10-CM | POA: Diagnosis not present

## 2017-12-11 DIAGNOSIS — M47816 Spondylosis without myelopathy or radiculopathy, lumbar region: Secondary | ICD-10-CM | POA: Diagnosis not present

## 2017-12-24 NOTE — Progress Notes (Deleted)
Cardiology Office Note  Date:  12/24/2017   ID:  Dakota CairoGary W Ellison, DOB 1951-03-15, MRN [id number]  PCP:  Michele McalpineNadel, Scott M, MD   No chief complaint on file.   HPI:  Dakota Ellison is a pleasant 67 year old gentleman with a history of  obesity,  obstructive sleep apnea,  Hypertension, hyperlipidemia  who presents for routine followup of his hypertension and hyperlipidemia  In follow-up today he reports he is doing well HBG elevated, told to give blood  Having difficulty with his statin, Crestor 10 mg daily  tried simvastatin, lipitor,  crestor Myalgia on all three Wonders what he should do  Chronic orthopedic issues, back pain, knee pain  Denies any chest pain concerning for angina Still works for [employer]  Lab work reviewed with him in detail Total chol 138, LDL 61 AIC 6.2  Active,  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 He does not do a regular exercise program.  He is able to tolerate his CPAP.  EKG on today's visit shows normal sinus rhythm with rate 77 bpm, no significant ST or T-wave changes  Other past medical history previous lower extremity ultrasound. By his report, this was normal with no evidence of PAD.  Last echocardiogram in March 2008 showing normal systolic function, right ventricle mildly dilated, mild mitral regurg, mild tricuspid regurg. Last stress test in March 2008 where he achieved 10 mets, exercised for 9 minutes, no ischemia noted. Overall a normal,  low-risk scan.     PMH:   has a past medical history of Anxiety state (04/08/2014), Degenerative disc disease, Dysthymia, ED (erectile dysfunction), GERD (gastroesophageal reflux disease), History of MRSA infection, Hyperlipemia, Hypertension, Mild asthma, Morbid obesity (HCC), OSA (obstructive sleep apnea), Osteoarthritis, Rectal fissure, and Testosterone deficiency.  PSH:    Past Surgical History:  Procedure Laterality Date  . KNEE SURGERY  09/2010   left knee  . OTHER SURGICAL HISTORY     arthroscopic  subacromial decompression  . OTHER SURGICAL HISTORY     open resection, distal right clavicle  . SHOULDER ARTHROSCOPY     right  . SPHINCTEROTOMY      Current Outpatient Medications  Medication Sig Dispense Refill  . ALPRAZolam (XANAX) 0.5 MG tablet TAKE 1 TABLET BY MOUTH THREE TIMES DAILYAS NEEDED FOR ANXIETY 90 tablet 5  . Amlodipine-Valsartan-HCTZ 10-320-25 MG TABS Take 1 tablet by mouth daily. 90 tablet 4  . APPLE CIDER VINEGAR PO Take by mouth daily.    Marland Kitchen. aspirin 81 MG tablet Take 81 mg by mouth daily.      Marland Kitchen. CINNAMON PO Take 200 mg by mouth daily.    . Coenzyme Q10 (CO Q 10 PO) Take by mouth daily.    Marland Kitchen. ezetimibe (ZETIA) 10 MG tablet Take 1 tablet (10 mg total) by mouth daily. 90 tablet 3  . GARLIC PO Take 4,0982,000 mg by mouth daily.    . Ginkgo Biloba 120 MG CAPS Take 120 mg by mouth daily.     Marland Kitchen. HYDROcodone-acetaminophen (NORCO/VICODIN) 5-325 MG tablet Take 1 tablet by mouth every 6 (six) hours as needed for moderate pain. Given by Dr. Channing Muttersoy    . hydrocortisone (PROCTOSOL HC) 2.5 % rectal cream Apply as directed after each BM     . meloxicam (MOBIC) 15 MG tablet Take 1 tablet (15 mg total) by mouth daily as needed for pain. 90 tablet 5  . Multiple Vitamin (MULTIVITAMIN) tablet Take 1 tablet by mouth daily.      . niacin 500 MG  CR capsule Take 500 mg by mouth 2 (two) times daily.     Marland Kitchen omeprazole (PRILOSEC) 20 MG capsule Take 1 capsule (20 mg total) by mouth daily. 90 capsule 1  . sertraline (ZOLOFT) 25 MG tablet TAKE 1 TABLET (25 MG TOTAL) BY MOUTH DAILY. 90 tablet 2  . sildenafil (VIAGRA) 100 MG tablet Take as directed 10 tablet 5  . testosterone enanthate (DELATESTRYL) 200 MG/ML injection Inject into the muscle every 14 (fourteen) days. For IM use only     No current facility-administered medications for this visit.      Allergies:   Atorvastatin; Crestor [rosuvastatin]; and Zocor [simvastatin]   Social History:  The patient  reports that he has never smoked. He has never used  smokeless tobacco. He reports that he does not drink alcohol or use drugs.   Family History:   family history includes Pneumonia in his brother.    Review of Systems: Review of Systems  Constitutional: Negative.   Respiratory: Negative.   Cardiovascular: Negative.   Gastrointestinal: Negative.   Musculoskeletal: Positive for back pain and joint pain.  Neurological: Negative.   Psychiatric/Behavioral: Negative.   All other systems reviewed and are negative.    PHYSICAL EXAM: VS:  There were no vitals taken for this visit. , BMI There is no height or weight on file to calculate BMI. No changes GEN: Well nourished, well developed, in no acute distress , obese HEENT: normal  Neck: no JVD, carotid bruits, or masses Cardiac: RRR; no murmurs, rubs, or gallops,no edema  Respiratory:  clear to auscultation bilaterally, normal work of breathing GI: soft, nontender, nondistended, + BS MS: no deformity or atrophy  Skin: warm and dry, no rash Neuro:  Strength and sensation are intact Psych: euthymic mood, full affect    Recent Labs: 10/09/2017: ALT 29; BUN 21; Creatinine, Ser 1.05; Hemoglobin 16.1; Platelets 275.0; Potassium 4.0; Sodium 139; TSH 1.85    Lipid Panel Lab Results  Component Value Date   CHOL 158 10/09/2017   HDL 42.20 10/09/2017   LDLCALC 61 10/08/2015   TRIG 240.0 (H) 10/09/2017      Wt Readings from Last 3 Encounters:  10/09/17 285 lb 3.2 oz (129.4 kg)  07/31/17 (!) 302 lb (137 kg)  04/17/17 297 lb (134.7 kg)       ASSESSMENT AND PLAN:   Essential hypertension - Plan: EKG 12-Lead Blood pressure is well controlled on today's visit. No changes made to the medications.  Morbid obesity (HCC) Weight stable, Recommend regular walking program,  Low carbohydrate  Pure hypercholesterolemia Myalgias on Crestor 10 Long discussion with him, he will try 5 mg daily   Obstructive sleep apnea on CPAP Tolerating his CPAP  Long discussion concerning his  statins Chronic joint pain  Total encounter time more than 25 minutes  Greater than 50% was spent in counseling and coordination of care with the patient    Disposition:   F/U  12 months as needed   No orders of the defined types were placed in this encounter.    Signed, Dossie Arbour, M.D., Ph.D. 12/24/2017  [facility] [place], [place] [phone]

## 2017-12-27 ENCOUNTER — Ambulatory Visit: Payer: PPO | Admitting: Cardiovascular Disease

## 2018-01-12 ENCOUNTER — Telehealth: Payer: Self-pay | Admitting: Cardiovascular Disease

## 2018-01-12 NOTE — Telephone Encounter (Signed)
-----   Message from Kendrick FriesMarina C Lopez, [place] sent at 01/12/2018  2:07 PM EDT ----- Pt not due until 10/19 12 month f/u please contact pt to see if needing to f/u earlier.  Pt last seen 10/18.  Thank you, Jearld AdjutantMarina

## 2018-01-12 NOTE — Telephone Encounter (Signed)
lmov to call office.

## 2018-01-15 ENCOUNTER — Ambulatory Visit: Payer: PPO | Admitting: Cardiovascular Disease

## 2018-01-15 NOTE — Telephone Encounter (Signed)
When patient called originally to have prescriptions refilled he was told he was overdue for a 6 month f/u and scheduled According to last visit with Dr. Mariah MillingGollan he is due in Oct 2019 Patient not having any issues - rescheduled for 10/21 with Dr. Mariah MillingGollan

## 2018-01-16 ENCOUNTER — Telehealth: Payer: Self-pay | Admitting: Pulmonary Disease

## 2018-01-16 MED ORDER — DICYCLOMINE HCL 20 MG PO TABS: 20.0000 mg | ORAL_TABLET | Freq: Three times a day (TID) | ORAL | 0 refills | Status: DC | PRN

## 2018-01-16 NOTE — Telephone Encounter (Signed)
Spoke with pt, c/o diarrhea X1.5 weeks.  Notes stomach discomfort/mild cramps.   Denies starting any new medications, diets.  Denies fever, dizziness, muscle aches/cramps.    Taking pepto bismol, immodium AD, not helping. Requesting additional recs.     Pharmacy: [facility].    SN please advise on additional recs.  Thanks!

## 2018-01-16 NOTE — Telephone Encounter (Signed)
Per SN- Stop Pepto, change to clear liquid diet, soup,bullion.  Continue imodium, 1 by mouth every 4 hours as needed for watery diarrhea. Add Bentyl 20mg , #30, take 1 by mouth,every 8 hours as needed for abd cramping.  Called and spoke with Patient about recommendations.  Patient stated understanding.  [facility].  Nothing further at this time.

## 2018-01-26 ENCOUNTER — Ambulatory Visit: Payer: PPO | Admitting: Cardiovascular Disease

## 2018-02-09 DIAGNOSIS — M1712 Unilateral primary osteoarthritis, left knee: Secondary | ICD-10-CM | POA: Diagnosis not present

## 2018-02-09 DIAGNOSIS — M1711 Unilateral primary osteoarthritis, right knee: Secondary | ICD-10-CM | POA: Diagnosis not present

## 2018-02-12 DIAGNOSIS — G4733 Obstructive sleep apnea (adult) (pediatric): Secondary | ICD-10-CM | POA: Diagnosis not present

## 2018-03-12 DIAGNOSIS — M47816 Spondylosis without myelopathy or radiculopathy, lumbar region: Secondary | ICD-10-CM | POA: Diagnosis not present

## 2018-03-12 DIAGNOSIS — Z79891 Long term (current) use of opiate analgesic: Secondary | ICD-10-CM | POA: Diagnosis not present

## 2018-03-13 ENCOUNTER — Telehealth: Payer: Self-pay | Admitting: Cardiovascular Disease

## 2018-03-13 NOTE — Telephone Encounter (Signed)
Patient has upcoming appointment to see Dr. Mariah Milling here in the office for his cardiac clearance for upcoming procedure.

## 2018-03-13 NOTE — Telephone Encounter (Signed)
° °  [facility] Pre-operative Risk Assessment    Request for surgical clearance:  1. What type of surgery is being performed? Left Total Knee   2. When is this surgery scheduled? 06-11-18  3. What type of clearance is required (medical clearance vs. Pharmacy clearance to hold med vs. Both)? Both   4. Are there any medications that need to be held prior to surgery and how long? Please advise   5. Practice name and name of physician performing surgery? [facility]   6. What is your office phone number [phone]   7.   What is your office fax number [phone]  8.   Anesthesia type (None, local, MAC, general) ? Choice    Clarisse Gouge 03/13/2018, 12:52 PM  _________________________________________________________________   (provider comments below)

## 2018-03-25 NOTE — Progress Notes (Deleted)
Cardiology Office Note  Date:  03/25/2018   ID:  Aimee, Timmons [birth year], MRN [id number]  PCP:  Michele Mcalpine, MD   No chief complaint on file.   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 He does not do a regular exercise program.  He is able to tolerate his CPAP.  EKG on today's visit shows normal sinus rhythm with rate 77 bpm, no significant ST or T-wave changes  Other past medical history previous lower extremity ultrasound. By his report, this was normal with no evidence of PAD.  Last echocardiogram in March 2008 showing normal systolic function, right ventricle mildly dilated, mild mitral regurg, mild tricuspid regurg. Last stress test in March 2008 where he achieved 10 mets, exercised for 9 minutes, no ischemia noted. Overall a normal,  low-risk scan.     PMH:   has a past medical history of Anxiety state (04/08/2014), Degenerative disc disease, Dysthymia, ED (erectile dysfunction), GERD (gastroesophageal reflux disease), History of MRSA infection, Hyperlipemia, Hypertension, Mild asthma, Morbid obesity (HCC), OSA (obstructive sleep apnea), Osteoarthritis, Rectal fissure, and Testosterone deficiency.  PSH:    Past Surgical History:  Procedure Laterality Date  . KNEE SURGERY  09/2010   left knee  . OTHER SURGICAL HISTORY     arthroscopic  subacromial decompression  . OTHER SURGICAL HISTORY     open resection, distal right clavicle  . SHOULDER ARTHROSCOPY     right  . SPHINCTEROTOMY      Current Outpatient Medications  Medication Sig Dispense Refill  . ALPRAZolam (XANAX) 0.5 MG tablet TAKE 1 TABLET BY MOUTH THREE TIMES DAILYAS NEEDED FOR ANXIETY 90 tablet 5  . Amlodipine-Valsartan-HCTZ 10-320-25 MG TABS Take 1 tablet by mouth daily. 90 tablet 4  . APPLE CIDER VINEGAR PO Take by mouth daily.    Marland Kitchen aspirin 81 MG tablet Take 81 mg by mouth daily.      Marland Kitchen CINNAMON PO Take 200 mg by mouth daily.    . Coenzyme Q10 (CO Q 10 PO) Take by mouth daily.    Marland Kitchen dicyclomine (BENTYL) 20 MG tablet Take 1 tablet (20 mg total) by mouth every 8 (eight) hours as needed for spasms (abd cramping). 30 tablet 0  . ezetimibe (ZETIA) 10 MG tablet Take 1 tablet (10 mg total) by mouth daily. 90 tablet 3  . GARLIC PO Take 7,829 mg by mouth daily.    . Ginkgo Biloba 120 MG CAPS Take 120 mg by mouth daily.     Marland Kitchen HYDROcodone-acetaminophen (NORCO/VICODIN) 5-325 MG tablet Take 1 tablet by mouth every 6 (six) hours as needed for moderate pain. Given by Dr. Channing Mutters    . hydrocortisone (PROCTOSOL HC) 2.5 % rectal cream Apply as directed after each BM     . meloxicam (MOBIC) 15 MG tablet Take 1 tablet (15 mg total) by mouth daily  as needed for pain. 90 tablet 5  . Multiple Vitamin (MULTIVITAMIN) tablet Take 1 tablet by mouth daily.      . niacin 500 MG CR capsule Take 500 mg by mouth 2 (two) times daily.     Marland Kitchen omeprazole (PRILOSEC) 20 MG capsule Take 1 capsule (20 mg total) by mouth daily. 90 capsule 1  . sertraline (ZOLOFT) 25 MG tablet TAKE 1 TABLET (25 MG TOTAL) BY MOUTH DAILY. 90 tablet 2  . sildenafil (VIAGRA) 100 MG tablet Take as directed 10 tablet 5  . testosterone enanthate (DELATESTRYL) 200 MG/ML injection Inject into the muscle every 14 (fourteen) days. For IM use only     No current facility-administered medications for this visit.      Allergies:    Atorvastatin; Crestor [rosuvastatin]; and Zocor [simvastatin]   Social History:  The patient  reports that he has never smoked. He has never used smokeless tobacco. He reports that he does not drink alcohol or use drugs.   Family History:   family history includes Pneumonia in his brother.    Review of Systems: Review of Systems  Constitutional: Negative.   Respiratory: Negative.   Cardiovascular: Negative.   Gastrointestinal: Negative.   Musculoskeletal: Positive for back pain and joint pain.  Neurological: Negative.   Psychiatric/Behavioral: Negative.   All other systems reviewed and are negative.    PHYSICAL EXAM: VS:  There were no vitals taken for this visit. , BMI There is no height or weight on file to calculate BMI. No changes GEN: Well nourished, well developed, in no acute distress , obese HEENT: normal  Neck: no JVD, carotid bruits, or masses Cardiac: RRR; no murmurs, rubs, or gallops,no edema  Respiratory:  clear to auscultation bilaterally, normal work of breathing GI: soft, nontender, nondistended, + BS MS: no deformity or atrophy  Skin: warm and dry, no rash Neuro:  Strength and sensation are intact Psych: euthymic mood, full affect    Recent Labs: 10/09/2017: ALT 29; BUN 21; Creatinine, Ser 1.05; Hemoglobin 16.1; Platelets 275.0; Potassium 4.0; Sodium 139; TSH 1.85    Lipid Panel Lab Results  Component Value Date   CHOL 158 10/09/2017   HDL 42.20 10/09/2017   LDLCALC 61 10/08/2015   TRIG 240.0 (H) 10/09/2017      Wt Readings from Last 3 Encounters:  10/09/17 285 lb 3.2 oz (129.4 kg)  07/31/17 (!) 302 lb (137 kg)  04/17/17 297 lb (134.7 kg)       ASSESSMENT AND PLAN:   Essential hypertension - Plan: EKG 12-Lead Blood pressure is well controlled on today's visit. No changes made to the medications.  Morbid obesity (HCC) Weight stable, Recommend regular walking program,  Low carbohydrate  Pure hypercholesterolemia Myalgias on Crestor  10 Long discussion with him, he will try 5 mg daily   Obstructive sleep apnea on CPAP Tolerating his CPAP  Long discussion concerning his statins Chronic joint pain  Total encounter time more than 25 minutes  Greater than 50% was spent in counseling and coordination of care with the patient    Disposition:   F/U  12 months as needed   No orders of the defined types were placed in this encounter.    Signed, Dossie Arbour, M.D., Ph.D. 03/25/2018  [facility] [place], [place] [phone]

## 2018-03-26 ENCOUNTER — Ambulatory Visit: Payer: PPO | Admitting: Cardiovascular Disease

## 2018-03-30 DIAGNOSIS — M1712 Unilateral primary osteoarthritis, left knee: Secondary | ICD-10-CM | POA: Diagnosis not present

## 2018-04-01 NOTE — Progress Notes (Signed)
Cardiology Office Note  Date:  04/02/2018   ID:  Dakota, Ellison March 02, 1951, MRN [id number]  PCP:  Dakota Mcalpine, MD   Chief Complaint  Patient presents with  . other    12 month f/u pt requesting cardiac arrest for left total knee replacement. Meds reviewed verbally with pt.    HPI:  Dakota Ellison is a pleasant 67 year old gentleman with a history of  obesity,  obstructive sleep apnea,  Hypertension, hyperlipidemia  who presents for routine followup of his hypertension and hyperlipidemia  Needs cardiac clearance for left total knee replacement Scheduled June 11, 2018 [facility] "[facility] on bone"  Plays in [employer] No regular exercise program but denies any chest pain concerning for angina  Problems tolerating statins including Crestor, simvastatin, Lipitor Able to tolerate Zetia Previously with myalgias on the others  Chronic orthopedic issues, back and knee bilaterally  HBG elevated, told to give blood periodically  Still works for Product/process development scientist work reviewed with him in detail Total chol 138, LDL 61 AIC 6.2  Active,  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 He does not do a regular exercise program.  He is able to tolerate his CPAP.  EKG personally reviewed by myself on todays visit Shows normal sinus rhythm rate 76 bpm no significant ST or T wave changes  Other past medical history previous lower extremity ultrasound. By his report, this was normal with no evidence of PAD.  Last echocardiogram in March 2008 showing normal systolic function, right ventricle mildly dilated, mild mitral regurg, mild tricuspid regurg. Last stress test in March 2008 where he achieved 10 mets, exercised for 9 minutes, no ischemia noted. Overall a normal,  low-risk scan.     PMH:   has a past medical history of Anxiety state (04/08/2014), Degenerative disc disease, Dysthymia, ED (erectile dysfunction), GERD (gastroesophageal reflux disease), History of MRSA infection,  Hyperlipemia, Hypertension, Mild asthma, Morbid obesity (HCC), OSA (obstructive sleep apnea), Osteoarthritis, Rectal fissure, and Testosterone deficiency.  PSH:    Past Surgical History:  Procedure Laterality Date  . KNEE SURGERY  09/2010   left knee  . OTHER SURGICAL HISTORY     arthroscopic subacromial decompression  . OTHER SURGICAL HISTORY     open resection, distal right clavicle  . SHOULDER ARTHROSCOPY     right  . SPHINCTEROTOMY      Current Outpatient Medications  Medication Sig Dispense Refill  . ALPRAZolam (XANAX) 0.5 MG tablet TAKE 1 TABLET BY MOUTH THREE TIMES DAILYAS NEEDED FOR ANXIETY 90 tablet 5  . Amlodipine-Valsartan-HCTZ 10-320-25 MG TABS Take 1 tablet by mouth daily. 90 tablet 4  . APPLE CIDER VINEGAR PO Take by mouth daily.    Marland Kitchen aspirin 81 MG tablet Take 81 mg by mouth daily.      Marland Kitchen CINNAMON PO Take 200 mg by mouth daily.    Marland Kitchen dicyclomine (BENTYL) 20 MG tablet Take 1 tablet (20 mg total) by mouth every 8 (eight) hours as needed for spasms (abd cramping). 30 tablet 0  . ezetimibe (ZETIA) 10 MG tablet Take 1 tablet (10 mg total) by mouth daily. 90 tablet 3  . GARLIC PO Take 4,098 mg by mouth daily.    . Ginkgo Biloba 120 MG CAPS Take 120 mg by mouth daily.     Marland Kitchen HYDROcodone-acetaminophen (NORCO/VICODIN) 5-325 MG tablet Take 1 tablet by mouth every 6 (six) hours as needed for moderate pain. Given by Dr. Channing Ellison    . hydrocortisone (PROCTOSOL  HC) 2.5 % rectal cream Apply as directed after each BM     . meloxicam (MOBIC) 15 MG tablet Take 1 tablet (15 mg total) by mouth daily as needed for pain. 90 tablet 5  . Multiple Vitamin (MULTIVITAMIN) tablet Take 1 tablet by mouth daily.      . niacin 500 MG CR capsule Take 500 mg by mouth 2 (two) times daily.     Marland Kitchen omeprazole (PRILOSEC) 20 MG capsule Take 1 capsule (20 mg total) by mouth daily. 90 capsule 1  . sertraline (ZOLOFT) 25 MG tablet TAKE 1 TABLET (25 MG TOTAL) BY MOUTH DAILY. 90 tablet 2  . sildenafil (VIAGRA) 100 MG  tablet Take as directed 10 tablet 5  . testosterone enanthate (DELATESTRYL) 200 MG/ML injection Inject into the muscle every 14 (fourteen) days. For IM use only     No current facility-administered medications for this visit.      Allergies:   Atorvastatin; Crestor [rosuvastatin]; and Zocor [simvastatin]   Social History:  The patient  reports that he has never smoked. He has never used smokeless tobacco. He reports that he does not drink alcohol or use drugs.   Family History:   family history includes Pneumonia in his brother.    Review of Systems: Review of Systems  Constitutional: Negative.   Respiratory: Negative.   Cardiovascular: Negative.   Gastrointestinal: Negative.   Musculoskeletal: Positive for back pain and joint pain.  Neurological: Negative.   Psychiatric/Behavioral: Negative.   All other systems reviewed and are negative.   PHYSICAL EXAM: VS:  BP 106/64 (BP Location: Left Arm, Patient Position: Sitting, Cuff Size: Large)   Pulse 76   Ht 6' (1.829 m)   Wt 288 lb 12 oz (131 kg)   BMI 39.16 kg/m  , BMI Body mass index is 39.16 kg/m. No changes Constitutional:  oriented to person, place, and time. No distress.  HENT:  Head: Normocephalic and atraumatic.  Eyes:  no discharge. No scleral icterus.  Neck: Normal range of motion. Neck supple. No JVD present.  Cardiovascular: Normal rate, regular rhythm, normal heart sounds and intact distal pulses. Exam reveals no gallop and no friction rub. No edema No murmur heard. Pulmonary/Chest: Effort normal and breath sounds normal. No stridor. No respiratory distress.  no wheezes.  no rales.  no tenderness.  Abdominal: Soft.  no distension.  no tenderness.  Musculoskeletal: Normal range of motion.  no  tenderness or deformity.  Neurological:  normal muscle tone. Coordination normal. No atrophy Skin: Skin is warm and dry. No rash noted. not diaphoretic.  Psychiatric:  normal mood and affect. behavior is normal. Thought  content normal.     Recent Labs: 10/09/2017: ALT 29; BUN 21; Creatinine, Ser 1.05; Hemoglobin 16.1; Platelets 275.0; Potassium 4.0; Sodium 139; TSH 1.85    Lipid Panel Lab Results  Component Value Date   CHOL 158 10/09/2017   HDL 42.20 10/09/2017   LDLCALC 61 10/08/2015   TRIG 240.0 (H) 10/09/2017      Wt Readings from Last 3 Encounters:  04/02/18 288 lb 12 oz (131 kg)  10/09/17 285 lb 3.2 oz (129.4 kg)  07/31/17 (!) 302 lb (137 kg)       ASSESSMENT AND PLAN:   Essential hypertension - Plan: EKG 12-Lead Blood pressure is well controlled on today's visit. No changes made to the medications. Stable  Morbid obesity (HCC) We have encouraged continued exercise, careful diet management in an effort to lose weight.  Pure hypercholesterolemia Myalgias on  Crestor 10 He is tolerating Zetia 10 mg daily We have ordered CT coronary calcium scoring for risk stratification study and some family history  Obstructive sleep apnea on CPAP Tolerating his CPAP  Preop cardiovascular evaluation Discussed risks of the surgery with him Surgery on the left knee No further testing needed, acceptable risk   Total encounter time more than 25 minutes  Greater than 50% was spent in counseling and coordination of care with the patient    Disposition:   F/U  12 months as needed   Orders Placed This Encounter  Procedures  . CT CARDIAC SCORING  . EKG 12-Lead     Signed, Dossie Arbour, M.D., Ph.D. 04/02/2018  [facility] [place], [place] [phone]

## 2018-04-02 ENCOUNTER — Ambulatory Visit (INDEPENDENT_AMBULATORY_CARE_PROVIDER_SITE_OTHER): Payer: PPO | Admitting: Cardiovascular Disease

## 2018-04-02 ENCOUNTER — Encounter: Payer: Self-pay | Admitting: Cardiovascular Disease

## 2018-04-02 DIAGNOSIS — R0789 Other chest pain: Secondary | ICD-10-CM | POA: Diagnosis not present

## 2018-04-02 DIAGNOSIS — F411 Generalized anxiety disorder: Secondary | ICD-10-CM | POA: Diagnosis not present

## 2018-04-02 DIAGNOSIS — E782 Mixed hyperlipidemia: Secondary | ICD-10-CM | POA: Diagnosis not present

## 2018-04-02 DIAGNOSIS — I10 Essential (primary) hypertension: Secondary | ICD-10-CM

## 2018-04-02 NOTE — Patient Instructions (Addendum)
[place], [facility], [place] ([facility])   Medication Instructions:  No changes  If you need a refill on your cardiac medications before your next appointment, please call your pharmacy.    Lab work: No new labs needed   If you have labs (blood work) drawn today and your tests are completely normal, you will receive your results only by: Marland Kitchen MyChart Message (if you have MyChart) OR . A paper copy in the mail If you have any lab test that is abnormal or we need to change your treatment, we will call you to review the results.   Testing/Procedures: We will order a CT coronary calcium score, family hx $150  Please call [phone] to schedule  [facility]  [address]  [place], [place] [address]  Follow-Up: At [facility], you and your health needs are our priority.  As part of our continuing mission to provide you with exceptional heart care, we have created designated Provider Care Teams.  These Care Teams include your primary Cardiologist (physician) and Advanced Practice Providers (APPs -  Physician Assistants and Nurse Practitioners) who all work together to provide you with the care you need, when you need it. . You will need a follow up appointment in 12 months .   Please call our office 2 months in advance to schedule this appointment.    Providers on your designated Care Team:   . Nicolasa Ducking, NP . Eula Listen, PA-C . Marisue Ivan, PA-C  Any Other Special Instructions Will Be Listed Below (If Applicable).  For educational health videos Log in to : [email or web address] Or : [email or web address], password : [email or web address]

## 2018-04-02 NOTE — Progress Notes (Signed)
Surgical Clearance form for Left TKR faxed to Dr. Lequita Halt at [facility] at [phone] Confirmation received.

## 2018-04-04 ENCOUNTER — Ambulatory Visit (INDEPENDENT_AMBULATORY_CARE_PROVIDER_SITE_OTHER)
Admission: RE | Admit: 2018-04-04 | Discharge: 2018-04-04 | Disposition: A | Discharge status: Home / Self Care | Payer: Self-pay | Source: Ambulatory Visit | Attending: Cardiovascular Disease | Admitting: Cardiovascular Disease

## 2018-04-04 DIAGNOSIS — R0789 Other chest pain: Secondary | ICD-10-CM

## 2018-04-05 DIAGNOSIS — R351 Nocturia: Secondary | ICD-10-CM | POA: Diagnosis not present

## 2018-04-05 DIAGNOSIS — N5201 Erectile dysfunction due to arterial insufficiency: Secondary | ICD-10-CM | POA: Diagnosis not present

## 2018-04-05 DIAGNOSIS — E291 Testicular hypofunction: Secondary | ICD-10-CM | POA: Diagnosis not present

## 2018-04-05 DIAGNOSIS — R948 Abnormal results of function studies of other organs and systems: Secondary | ICD-10-CM | POA: Diagnosis not present

## 2018-04-05 DIAGNOSIS — N401 Enlarged prostate with lower urinary tract symptoms: Secondary | ICD-10-CM | POA: Diagnosis not present

## 2018-04-09 ENCOUNTER — Ambulatory Visit (INDEPENDENT_AMBULATORY_CARE_PROVIDER_SITE_OTHER): Payer: PPO

## 2018-04-09 ENCOUNTER — Telehealth: Payer: Self-pay | Admitting: *Deleted

## 2018-04-09 ENCOUNTER — Ambulatory Visit (INDEPENDENT_AMBULATORY_CARE_PROVIDER_SITE_OTHER): Payer: PPO | Admitting: Pulmonary Disease

## 2018-04-09 ENCOUNTER — Encounter: Payer: Self-pay | Admitting: Pulmonary Disease

## 2018-04-09 VITALS — BP 124/70 | HR 78 | Temp 98.4°F | Ht 72.0 in | Wt 284.2 lb

## 2018-04-09 DIAGNOSIS — E782 Mixed hyperlipidemia: Secondary | ICD-10-CM

## 2018-04-09 DIAGNOSIS — G8929 Other chronic pain: Secondary | ICD-10-CM

## 2018-04-09 DIAGNOSIS — M47816 Spondylosis without myelopathy or radiculopathy, lumbar region: Secondary | ICD-10-CM | POA: Diagnosis not present

## 2018-04-09 DIAGNOSIS — E349 Endocrine disorder, unspecified: Secondary | ICD-10-CM | POA: Diagnosis not present

## 2018-04-09 DIAGNOSIS — M25562 Pain in left knee: Secondary | ICD-10-CM | POA: Diagnosis not present

## 2018-04-09 DIAGNOSIS — M159 Polyosteoarthritis, unspecified: Secondary | ICD-10-CM

## 2018-04-09 DIAGNOSIS — M150 Primary generalized (osteo)arthritis: Secondary | ICD-10-CM

## 2018-04-09 DIAGNOSIS — M545 Low back pain, unspecified: Secondary | ICD-10-CM

## 2018-04-09 DIAGNOSIS — G4733 Obstructive sleep apnea (adult) (pediatric): Secondary | ICD-10-CM | POA: Diagnosis not present

## 2018-04-09 DIAGNOSIS — Z23 Encounter for immunization: Secondary | ICD-10-CM | POA: Diagnosis not present

## 2018-04-09 DIAGNOSIS — N320 Bladder-neck obstruction: Secondary | ICD-10-CM | POA: Diagnosis not present

## 2018-04-09 DIAGNOSIS — R7302 Impaired glucose tolerance (oral): Secondary | ICD-10-CM | POA: Diagnosis not present

## 2018-04-09 DIAGNOSIS — F341 Dysthymic disorder: Secondary | ICD-10-CM

## 2018-04-09 DIAGNOSIS — I10 Essential (primary) hypertension: Secondary | ICD-10-CM

## 2018-04-09 DIAGNOSIS — Z9989 Dependence on other enabling machines and devices: Secondary | ICD-10-CM

## 2018-04-09 DIAGNOSIS — J4520 Mild intermittent asthma, uncomplicated: Secondary | ICD-10-CM

## 2018-04-09 MED ORDER — ALPRAZOLAM 0.5 MG PO TABS: ORAL_TABLET | ORAL | 3 refills | Status: DC

## 2018-04-09 NOTE — Patient Instructions (Signed)
Today we updated your med list in our EPIC system...    Continue your current medications the same...  We gave you the 2019 FLU vaccine today...  We reviewed your previous lab work, plus your recent Chest scan from Cardiology, and you OK for the knee surgery by DrAlusio in January...  We discussed my up-coming retirement & calling the [facility] office to see which of their practitioners are taking new patients for Primary Care purposes...  Dakota Ellison,  It has been an honor to have been part of your health-care team over these many years!!!    Sending you my best wishes for good health & much happiness in the years to come.Marland KitchenMarland Kitchen

## 2018-04-09 NOTE — Telephone Encounter (Signed)
-----   Message from Antonieta Iba, MD sent at 04/08/2018  8:33 PM EST ----- CT coronary calcium score Score of 0 Excellent finding, indicating no calcified coronary atherosclerotic plaque noted  No other significant findings on the scan were picked up

## 2018-04-09 NOTE — Telephone Encounter (Signed)
No answer. Left message to call back.   

## 2018-04-10 NOTE — Progress Notes (Addendum)
Subjective:    Patient ID: Dakota Ellison, male    DOB: 20-Apr-1951, 67 y.o.   MRN: [id number]  HPI    67 y/o WM here for an add-on visit... he has multiple medical problems as noted below...   ~  SEE PREV EPIC NOTES FOR OLDER DATA >>     LABS 4/13:  FLP- at goals on Simva40;  Chems- ok w/ BS=106;  CBC- wnl;  TSH=2.20;  PSA=1.44  LABS 10/13:  Chems- ok w/ BS106...   LABS 4/14:  FLP- ok x TG=167 on Simva40 & needs better low fat diet & wt reduction;  Chems- wnl w/ BS=94 & A1c=5.9;  CBC- wnl;  TSH=2.39;  PSA=1.30...  LABS 10/14:  Chems- ok x BS=113, A1c=6.0 (OK on diet alone)...   Art Dopplers of LEs done 02/27/14 were WNL- and walk test was neg w/o claudication... 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  CXR 10/08/15>  Norm heart size, clear lungs, NAD...  LABS 10/08/15>  FLP- at goals on Lip10 but elev TG & needs better diet/ exerc/ get wt down;  Chems- ok w/ BS=95, A1c=6.2;  CBC- wnl;  TSH=1.59;  PSA=1.10;  UA- clear ~  Apr 11, 2016:  Lavoy returns and notes recent dental problems w/ infection & root canal;  He has been seeing ORTHO for his knee, getting shots, but told he needs TKR;  He has also been seeing NS-DrRoy for back pain but we do not have notes from these specialists to review...   EKG 03/28/16 by Stormy Card showed NSR, rate97, wnl- NAD...   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  We reviewed the following medical problems during today's office visit >>     OSA> he had an AHI=82 in 2010 & started on CPAP20 by Howell Rucks, [facility] & he has had sleep f/u by DrClance=> DrYoung seen 07/2016 and stable.    Hx mild asthma> he still smokes an occas cigar & asked to quit completely!!! He states no attacks in yrs & breathing is OK, not on meds- denies CP, palpit, SOB, edema...    HBP> on ExforgeHCT (10-320-25); BP today= 118/76 & he remains asymptomatic w/o CP, palpit, ch in SOB, edema, etc...    Hyperlipid> on Crestor10 + Niaspan; FLP 5/18 showed TChol 142, TG 219, HDL 44, LDL 74; needs better low fat diet & weight loss!    Obesity> wt= 290# which is down7#- prev unable to lose wt effectively; we reviewed diet/ exercise/ wt reduction program...    GI> GERD, Rectal fissure> on Prilosec20 & AnusolHC as needed; he saw [facility] 6/14 for colonoscopy> 2 sm sigmoid polyps removed, int  hems, & we don't have path reports; pt told to f/u in 5-33yrs    GU> Low-T & ED>  He takes a bunch of vits & herbs including garlic, cinnamon, vinegar, ginko, MVI, etc; DrDahlstedt treats him w/ Testos shots (200 Q2wks) & Viagra prn; PSAs and Hg have been wnl...    DJD> he uses OTC analgesics, Tramadol50=> now Vicodin prn per [facility]; needs TKRs but he's holding off, needs to lose wt...    Depression> he remains on Zoloft25 & Xanax0.5 as needed... EXAM shows Afeb, VSS, O2sat=95% on RA;  Wt=290# 6'Tall, BMI=39-40;  HEENT- parotid hypertrophy, mallampati2, no bruits;  Chest- clear w/o w/r/r;  Heart- RR w/o m/r/g;  Abd- obese, soft, sl panniculus; Ext- DJD knees, w/o c/c/e...  LABS 10/10/16>  Chol is ok on Cres10 but TG elev at 219- needs better diet;  Chems- wnl;  CBC- wnl (Hg=17.3);  TSH=1.32;  PSA=1.53;  VitD=43... IMP/PLAN>>  Akito is stable w/ his mult  medical issues- continue CPAP, same meds, get on diet w/ wt reduction;  DrRoy prescribes Vicodin for pain & pt is cautioned;  DrDahstedt prescribes Testos shots & checks his PSAs and Hg... Continue samemeds for now...  ~  April 17, 2017:  6 month ROV & Wilkin reports a good 66mo interval w/o new complaints or concerns... We reviewed the following interval notes in Epic>      He saw UROLOGY- DrDahlstedt on 02/20/17>  Low-T on Testos shots 200mg  Q2wks + Viagra100 prn; Labs done 02/2017 showed Hg=16.8, Hct=51.8, PSA=1.18, Testos=352; continue same rx...    He saw NS- DrRoy on 02/20/17>  Saw BMarshall,NP for his chr lumbar pain- good days & bad, activities makes it worse, Dx w/ Lumbar spondylosis & Rx w/ Vicodin5/325 for prn use...     He saw CARDS- DrGollan 03/27/17>  Hx HBP, HL, obesity, OSA; denies CP, palpit, dizzy, edema; works farm Facilities manager; BP controlled  On Exforge (Amlod10/ Valsar320/ HCT25); HL improved on Cres10 + Niacin500-2/d but he c/o myalgias therefore decr to 5mg /d;   We reviewed the following medical problems during today's office visit>      OSA> he had an AHI=82 in 2010 & started on CPAP20 by Howell Rucks, [facility] & he has had sleep f/u by DrClance=> DrYoung seen 07/2016 and stable.    Hx mild asthma> he still smokes an occas cigar (says he quit the cigars too);  He states no attacks in yrs & breathing is OK, not on meds- denies CP, palpit, SOB, edema...    HBP> on ExforgeHCT (10-320-25); BP today= 126/68 & he remains asymptomatic w/o CP, palpit, ch in SOB, edema, etc...    Hyperlipid> on Crestor10 + Niaspan; c/o myalgias on the Cres10- FLP 5/18 showed TChol 142, TG 219, HDL 44, LDL 74; needs better low fat diet & weight loss!    Obesity> wt= 297# which is back up 7#- prev unable to lose wt effectively; we reviewed diet/ exercise/ wt reduction program...    GI> GERD, Rectal fissure> on Prilosec20 & AnusolHC as needed; he saw [facility] 6/14 for colonoscopy> 2 sm sigmoid polyps removed, int  hems, & we don't have path reports; pt told to f/u in 5-81yrs    Right inguinal hernia>  Prev referred to [facility]- DrGerkin but pt didn't go, says he had prev hernia surg & didn't want to do it again...     GU> Low-T & ED>  He takes a bunch of vits & herbs including garlic, cinnamon, vinegar, ginko, MVI, etc;  DrDahlstedt treats him w/ Testos shots (200 Q2wks) & Viagra prn; PSAs and Hg have been wnl...    DJD> he uses OTC analgesics, Tramadol50=> now Vicodin prn per [facility]; needs TKRs but he's holding off, needs to lose wt.; Add Meloxicam 15mg  daily if needed...    Depression> he remains on Zoloft25 & Xanax0.5 as needed... EXAM shows Afeb, VSS, O2sat=95% on RA;  Wt=290# 6'Tall, BMI=39-40;  HEENT- parotid hypertrophy, mallampati2, no bruits;  Chest- clear w/o w/r/r;  Heart- RR w/o m/r/g;  Abd- obese, soft, sl panniculus; Ext- DJD knees, w/o c/c/e...  LABS from [facility]> done 02/20/17-- Hg=16.8, Hct=51.8, PSA=1.18, Testos=352... IMP/PLAN>>  Denney is overall stable- we added Meloxicam 15mg /d as a trial to see if this helps;  He is tol the [employer] but still needs a better diet w/ wt reduction & low fat;  We gave him the Pneumovax23 vaccine today & plan rov recheck in 69mo w/ fasting labs...   ~  Oct 09, 2017:  69mo ROV & Lenzy reports another stable interval- his CC remains his orthopedic issues w/ knees bone-on-bone & need for TKRs but he is holding off;  He still sees DrMRoy, NS in [place] for back pain and he serves as his pain doctor writing Hydrocodone monthly...  We reviewed the following interval notes in Epic>      He saw UROLOGY- DrDahlstedt on 06/14/17>  Followed for low-T on testos cypionate 200 Q2wks, f/u PSA, testos level & Hgb have been nromal; also takes sildenafil for ED...     He saw SLEEP- DrYoung 07/31/17>  He had NPSG 2010 showing AHI=82/hr, on Auto CPAP 10-20, download showed good compliance but resid AHI=11, improved to 4, asked to work on weight reduction...     He saw DrRoy's NP-BMarshall  09/11/17>  8mo rov for lumbar spondylosis & facet arthropathy; note indicates [facility] rx 1-2tabs Q4-6H w/ #180 written but pt reports that this is not completely relieving his pain; denies radiation down legs or weakness; also takes cyclobenzaprine & they ordered R+L L4-5 facet injections per [facility] (done 10/06/27 by DrCurnes)... We reviewed the following medical problems during today's office visit>      OSA & mild asthma> on CPAP w/ good compliance & not requiring bronchdilators since he's quit the cigar smoking...     HBP, HL, Obesity> he understands cardiac risk factors-- BP controlled on ExforgeHCT (10-320-25), Lipids at goals on Crestor10+Niaspan but he STOPPED the Crestor due to legs aching, now on ZETIA10+NIACIN500Bid per [facility]- DrGollan (FLP 10/2017- TChol 158, TG 240, HDL 42, LDL 88), still struggles w/ diet & weight...    GI- GERD, rectal fissure, RIH-- on Prilosec20 & AnusolHC as needed, he has refused surg consult for [facility] repair...    GU- as above per DrDahlstedt...    Ortho- per DrAlusio & DrMRoy... EXAM shows Afeb, VSS, O2sat=97% on RA;  Wt=285# 6'Tall, BMI=39;  HEENT- parotid hypertrophy, mallampati2, no bruits;  Chest- clear w/o w/r/r;  Heart- RR w/o m/r/g;  Abd- obese, soft, sl panniculus; Ext- DJD knees, w/o c/c/e...  LABS 10/09/17>  FLP- chol ok on Cres10 but TG=240 & must diet/ get weight down;  Chems- ok w/ BS=101;  CBC- wnl;  TSH=1.85;  PSA=3.46... IMP/PLAN>>  Problems as above, continue same meds, try to slowly wean off narcotic analgesics per DrRoy/ pain management, diet/ exercise/ wt reduction is most important...    ~  April 09, 2018:  69mo ROV & general medical follow up visit>  Candelario tells me that DrAlusio  plans Left TKR Jan2020 & he needs medical clearance;  He continues to see DrMRoy neurosurg in [place] for his back pain & they serve as his pain doctors filling #180 Vicodin tabs every month w/ Rx 1-2 tabs every 4-6H as needed- he tells me that he does not take 6 tabs daily  but the [place] registry documents #180 tabs filled monthly from NP- Anastasia Fiedler in their office... We reviewed the following interval notes in Epic>    He saw CARDS- DrGollan on 04/02/18>  Yearly ROV HBP, HL, Obesity, OSA; on Amlod-Valsar-HCT (10-320-25) daily, Zetia10;  Intol Cres10 w/ muscle cramps;  BP well controlled at 110/64, no improvement in weight w/ BMI=39;  EKG showed NSR, rate76, wnl- NAD;  Pt was rec to follow diet/ exercsie/ wt reduction;  They did a CT coronary calcium score, done 04/04/18 and results were ZERO! We reviewed the following medical problems during today's office visit>      OSA> he had an AHI=82 in 2010 & started on CPAP20 by Howell Rucks, [facility] & he has had sleep f/u by DrClance=> DrYoung seen 07/2016 and stable; he gets supplies from [facility]...    Hx mild asthma> he says he quit the cigars & his breathing is good w/o meds;  He states no attacks in yrs & denies CP, palpit, SOB, wheezing, edema, etc...    HBP> on ExforgeHCT (10-320-25); BP today= 124/70 & he remains asymptomatic w/o CP, palpit, ch in SOB, edema, etc...    Hyperlipid> prev on Crestor10 + Niaspan; c/o myalgias on the Cres10 therefore stopped & switched to Zetia10 (plus the Niacin 500mg Bid); Last FLP 10/2017 showed TChol 158, TG 240, HDL 42, LDL 88; advised same meds/ low fat/ wt reduction.    Obesity> wt= 284# - prev unable to lose wt effectively; we reviewed diet/ exercise/ wt reduction program...    GI> GERD, Rectal fissure> on Prilosec20 & AnusolHC as needed; he saw [facility] 6/14 for colonoscopy> 2 sm sigmoid polyps removed, int hems, & we don't have path reports; pt told to f/u in 5-52yrs    Right inguinal hernia>  Prev referred to [facility]- DrGerkin but pt didn't go, says he had prev hernia surg & didn't want to do it again...     GU> Low-T & ED>  He takes a bunch of vits & herbs including garlic, cinnamon, vinegar, ginko, MVI, etc; DrDahlstedt treats him w/ Testos shots (200 Q2wks) & Viagra prn; PSAs and Hg have been  wnl...    DJD> he uses OTC analgesics, Tramadol50=> now Vicodin prn per [facility]; needs TKRs & DrAlusio has sched left TKR 06/2018; he is again asked to decrease the Hydrocodone use...    Depression> he remains on Zoloft25 & Xanax0.5 as needed... EXAM shows Afeb, VSS, O2sat=94% on RA;  Wt=284# 6'Tall, BMI=39;  HEENT- parotid hypertrophy, mallampati2, no bruits;  Chest- clear w/o w/r/r;  Heart- RR w/o m/r/g;  Abd- obese, soft, sl panniculus; Ext- DJD knees, w/o c/c/e... IMP/PLAN>>  Marquez is ok for the upcoming TKR, continue same meds, get on diet & get weight down!  He needs to diminish the Hydrocodone use- discussed;  OK FLU vaccine today & otherw up to date;  We discussed my pending retirement in Jan2020 & he will chewck w/ [facility] for new PCP...          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 it got worse  and was sent to DrWolicki who hospitalized him 3/08 w/ MRSA infection- Rx's w/ Vancomycin infusions... now resolved.  OBSTRUCTIVE SLEEP APNEA (ICD-327.23) - Apr10> had Sleep Study at [facility]> pos for OSA w/ AHI= 82, desat to 85%... split night study w/ good control on CPAP 20 (see report)... started on CPAP by [facility] & still followed by him...  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 no recent exac and no regular meds... told to discontinue all smoking> denies much cough, sputum, dyspnea, CP, etc... ~  CXR 2/09 showed normal heart size, clear lungs, degen changes in Tspine, NAD...   HYPERTENSION (ICD-401.9) - followed by DrGollan/ [facility]> on ECASA 325mg /d & EXFORGE 10/320/25 daily + KCl 38mEq/d... he had CP while hosp w/ the MRSA 2008 and DrWolicki consulted DrMcQueen who did a NuclearStressTest 3/08 which was normal... and a 2DEcho which showed mild MR and nl LVF... ~ 10/12:  f/u visit DrGollan at [facility]> note reviewed, no changes made,  DrGollan desperately wants him to lose weight! ~  10/12:  BP= 124/76 doing well, remains asymptomatic... ~  4/13:  BP= 120/78 & he denies CP, palpit, SOB, edema, etc... ~  10/13:  BP= 118/76 & he remains asymptomatic; EKG showed NSR, rate86, wnl, NAD... ~  4/14:  BP= 134/80 & he denies CP, palpit, dizzy, SOB, edema... ~  8/14:  BP= 120/82 & he remains asymptomatic...  HYPERLIPIDEMIA (ICD-272.4) - he's been switched around by [facility] betw Simva/ [place]/ back to SIMVASTATIN 40mg /d now + NIACIN 500mg Bid + FISH OIL 1000mg Bid...  still not on any kind of diet but his weight is stable at 294#.Marland Kitchen. ~  FLP 1/08 showed TChol 195, TG 119, HDL 41, LDL 131 ~  FLP 2/09 showed TChol 214, TG 214, HDL 39, LDL 127 ~  12/09: recent labs by [facility]- we don't have these results... ~  FLP 5/10 on 3 meds above showed TChol 148, TG 81, HDL 48, LDL 84 ~  FLP 11/10 on 3 meds showed TChol 135, TG 145, HDL 36, LDL 70 ~  FLP 5/11 showed TChol 145, TG 128, HDL 43, LDL 77... keep same Rx. ~  FLP 10/11 showed TChol 152, TG 198, HDL 41, LDL 72... same meds, better diet, get wt down! ~  FLP 4/12 showed TChol 141, TG 200, HDL 39, LDL 62... Similar> needs better low fat diet, get wt down. ~  10/12:  FLP not rechecked today as he has not lost wt etc... ~  FLP 4/13 on Simva40+Niasp1000 showed TChol 137, TG 124, HDL 43, LDL 69 ~  FLP 4/14 on Simva40+Niasp1000 showed TChol 128, TG 167, HDL 35, LDL 59  MORBID OBESITY (ICD-278.01) - despite all efforts weight remains >300#... discussed wt watchers, nutrition center, poss bariatric approach... he's not been able to exercise but will join the Y & start diet program! ~  5/11:  he reports that DrElkins gave him 30d supply of Phenteramine which really helped- lost 20#, but put it all back + 9# more off this med... we discussed the dangers of diet pills & rec diet + exercise, neither of which he has tried in earnest! ~  10/11:  weight up 15# to 314# today> discussed diet, exercise, etc... ~  4/12:   Weight = 309# and needs to do better w/ diet/ exercise... ~  10/12:  Weight = 314# and we reviewed diet, exercise, wt loss program... ~  4/13:  Weight = 311# ~  10/13:  Weight = 322# ~  4/14:  Weight = 320# ~  8/14:  Weight = 307#  GERD (ICD-530.81) - he had an EGD from [facility] 4/03 which was neg... he takes OMEPRAZOLE 20mg /d for reflux symptoms which works well...  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 prev tried Diltiazem cream, now ANUSOL HC etc... COLON POLYPS >>  ~  pt reports colonoscopy by [facility] 7/03- reported neg & f/u planned 5 yrs> he is overdue & will call for f/u exam... ~  Malachi saw [facility] 6/14 for colonoscopy> 2 sm sigmoid polyps removed, int hems, & we don't have path reports; pt told to f/u in 5-66yrs...  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 also takes Viagra Prn...  ~  labs 5/10 showed PSA = 0.85 ~  labs 5/11 showed PSA= 0.65 ~  Labs 4/12 showed PSA= 0.79 ~  Labs 4/13 showed PSA= 1.44 ~  9/13: seen by DrDahlstedt> treated for hypogonadism w/ Testopel, then Testos shots but he was irregular w/ his visits; prostate exam was wnl; they tried to teach him self injected Testos Q2weeks...  ~  Labs 4/14 showed PSA= 1.30 ~  He had a f/u visit w/ DrDahlstedt 6/14> hypogonadism on testos shots 200mg /ml- getting 1ml every other week & Viagra prn; they checked UA, CBC, Prolactin, Testos level & PSA- we do not have their lab data.   OSTEOARTHRITIS (ICD-715.90) DEGENERATIVE DISC DISEASE (ICD-722.6) - followed by DrMurphy for right knee pain... prev seen by Mayford Knife for DDD and LBP... ~  s/p left CTS surg 2009 ~  s/p right knee arthroscopy by DrMurphy 2/10... also had cortisone shot in knee 4/10 ~  s/p trigger  finger injections by DrGramig ~  4/12:  s/p left knee arthroscopy by DrMurphy> improved...  DYSTHYMIA (ICD-300.4) - some stress, some depression which is mild by his determination... he tried Lexapro last year but didn't follow up... now using Xanax as needed, and started ZOLOFT 25mg /d in Feb10- he reports better, wants to continue this dose.  HEALTH MAINTENANCE: ~  GI:  He saw [facility] 6/14 for f/u colonoscopy w/ 2 polyps removed- we don't have path. ~  GU:  Followed by DrDahlstedt as above... ~  Immuniz:  He gets Flu shot each yr at work; he had TDAP at work 2013...   Past Surgical History  Procedure Date  . Sphincterotomy   . Shoulder arthroscopy     right  . Other surgical history     arthroscopic subacromial decompression  . Other surgical history     open resection, distal right clavicle  . Knee surgery 09/2010    left knee    =>  s/p left knee arthroscopy 4/12 by DrMurphy      Outpatient Encounter Medications as of 04/09/2018  Medication Sig  . ALPRAZolam (XANAX) 0.5 MG tablet TAKE 1 TABLET BY MOUTH THREE TIMES DAILYAS NEEDED FOR ANXIETY  . Amlodipine-Valsartan-HCTZ 10-320-25 MG TABS Take 1 tablet by mouth daily.  . APPLE CIDER VINEGAR PO Take by mouth daily.  Marland Kitchen aspirin 81 MG tablet Take 81 mg by mouth daily.    Marland Kitchen CINNAMON PO Take 200 mg by mouth daily.  Marland Kitchen GARLIC PO Take 1,610 mg by mouth daily.  . Ginkgo Biloba 120 MG CAPS Take 120 mg by mouth daily.   Marland Kitchen  HYDROcodone-acetaminophen (NORCO/VICODIN) 5-325 MG tablet Take 1 tablet by mouth every 6 (six) hours as needed for moderate pain. Given by Dr. Channing Mutters  . hydrocortisone (PROCTOSOL HC) 2.5 % rectal cream Apply as directed after each BM   . Multiple Vitamin (MULTIVITAMIN) tablet Take 1 tablet by mouth daily.    . niacin 500 MG CR capsule Take 500 mg by mouth 2 (two) times daily.   Marland Kitchen omeprazole (PRILOSEC) 20 MG capsule Take 1 capsule (20 mg total) by mouth daily.  . sertraline (ZOLOFT) 25 MG tablet TAKE 1 TABLET (25 MG TOTAL)  BY MOUTH DAILY.  . sildenafil (VIAGRA) 100 MG tablet Take as directed  . testosterone enanthate (DELATESTRYL) 200 MG/ML injection Inject into the muscle every 14 (fourteen) days. For IM use only  . [DISCONTINUED] ALPRAZolam (XANAX) 0.5 MG tablet TAKE 1 TABLET BY MOUTH THREE TIMES DAILYAS NEEDED FOR ANXIETY  . [DISCONTINUED] dicyclomine (BENTYL) 20 MG tablet Take 1 tablet (20 mg total) by mouth every 8 (eight) hours as needed for spasms (abd cramping).  . [DISCONTINUED] meloxicam (MOBIC) 15 MG tablet Take 1 tablet (15 mg total) by mouth daily as needed for pain.  Marland Kitchen ezetimibe (ZETIA) 10 MG tablet Take 1 tablet (10 mg total) by mouth daily.   No facility-administered encounter medications on file as of 04/09/2018.     Allergies  Allergen Reactions  . Atorvastatin Other (See Comments)    Muscle weakness in his legs  . Crestor [Rosuvastatin] Other (See Comments)    Severe muscle cramping  . Zocor [Simvastatin] Other (See Comments)    Muscle weakness in his legs    Immunization History  Administered Date(s) Administered  . Influenza Split 03/25/2011, 03/23/2012, 03/18/2013  . Influenza Whole 03/06/2009, 03/08/2010  . Influenza, High Dose Seasonal PF 04/09/2018  . Influenza,inj,Quad PF,6+ Mos 03/06/2014, 03/28/2016  . Influenza-Unspecified 03/21/2015, 03/09/2017  . Pneumococcal Conjugate-13 10/08/2015  . Pneumococcal Polysaccharide-23 04/17/2017  . Tdap 03/07/2011  . Zoster 10/23/2015    Current Medications, Allergies, Past Medical History, Past Surgical History, Family History, and Social History were reviewed in [employer] record.    Review of Systems         See HPI - all other systems neg except as noted... The patient complains of dyspnea on exertion.  The patient denies anorexia, fever, weight loss, weight gain, vision loss, decreased hearing, hoarseness, chest pain, syncope, peripheral edema, prolonged cough, headaches, hemoptysis, abdominal pain,  melena, hematochezia, severe indigestion/heartburn, hematuria, incontinence, muscle weakness, suspicious skin lesions, transient blindness, difficulty walking, depression, unusual weight change, abnormal bleeding, enlarged lymph nodes, and angioedema.     Objective:   Physical Exam     WD, Obese, 67 y/o WM in NAD... GENERAL:  Alert & oriented; pleasant & cooperative. HEENT:  [place]/AT, EOM-wnl, PERRLA, EACs-clear, TMs-wnl, NOSE-clear, THROAT-clear & wnl. NECK:  Supple w/ fairROM; no JVD; normal carotid impulses w/o bruits; no thyromegaly or nodules palpated; no lymphadenopathy. CHEST:  Clear to P & A; without wheezes/ rales/ or rhonchi. HEART:  Regular Rhythm; gr 1/6 SEM, without rubs or gallops... ABDOMEN:  Obese soft & nontender; normal bowel sounds; no organomegaly or masses detected.  EXT: without deformities, mild arthritic changes; no varicose veins/ +venous insuffic/ no edema.right knee w/ ace wrap.  NEURO:  CN's intact; no focal neuro deficits... DERM: diffuse sl excoriated papular rash on legs, ankles, feet, groin, buttocks, back, chest> c/w scabies...  RADIOLOGY DATA:  Reviewed in the EPIC EMR & discussed w/ the patient...  LABORATORY DATA:  Reviewed in the [facility] EMR & discussed w/ the patient...   Assessment & Plan:    57/18>   Wise is stable w/ his mult medical issues- continue CPAP, same meds, get on diet w/ wt reduction;  DrRoy prescribes Vicodin for pain & pt is cautioned;  DrDahstedt prescribes Testos shots & checks his PSAs and Hg... Continue samemeds for now. 04/17/17>   Dakota Ellison is overall stable- we added Meloxicam 15mg /d as a trial to see if this helps;  He is tol the [employer] but still needs a better diet w/ wt reduction & low fat;  We gave him the Pneumovax23 vaccine today & plan rov recheck in 39mo w/ fasting labs. 10/09/17>   Problems as above, continue same meds, try to slowly wean off narcotic analgesics per DrRoy/ pain management, diet/ exercise/ wt reduction is most  important 04/09/18>   Dakota Ellison is ok for the upcoming TKR, continue same meds, get on diet & get weight down!  He needs to diminish the Hydrocodone use- discussed;  OK FLU vaccine today & otherw up to date;  We discussed my pending retirement in Jan2020 & he will chewck w/ [place]   OSA>  On CPAP per DrYoung now & stable, good compliance, etc...  Asthma>  He has decr cigars but still needs to quit, we discussed smoking cessation but he doesn't want med help etc...  HBP>  Controlled on the ExforgeHCT Rx, he likes the one pill;  discussed the need to lose wt, no salt, etc...  Hyperlipidemia>  FLPs reviewed> if he wants to improve these numbers MUST get on diet & get wt down!!!  OBESITY>  As above!!!  GU>  Per DrDahlstedt on Testos shots & Viagra...  DJD>  S/p arthroscopic surg left knee by DrMurphy but told he needs TKR & holding off...   Patient's Medications  New Prescriptions   No medications on file  Previous Medications   AMLODIPINE-VALSARTAN-HCTZ 10-320-25 MG TABS    Take 1 tablet by mouth daily.   APPLE CIDER VINEGAR PO    Take by mouth daily.   ASPIRIN 81 MG TABLET    Take 81 mg by mouth daily.     CINNAMON PO    Take 200 mg by mouth daily.   EZETIMIBE (ZETIA) 10 MG TABLET    Take 1 tablet (10 mg total) by mouth daily.   GARLIC PO    Take 2,000 mg by mouth daily.   GINKGO BILOBA 120 MG CAPS    Take 120 mg by mouth daily.    HYDROCODONE-ACETAMINOPHEN (NORCO/VICODIN) 5-325 MG TABLET    Take 1 tablet by mouth every 6 (six) hours as needed for moderate pain. Given by Dr. Channing Mutters   HYDROCORTISONE (PROCTOSOL [facility]) 2.5 % RECTAL CREAM    Apply as directed after each BM    MULTIPLE VITAMIN (MULTIVITAMIN) TABLET    Take 1 tablet by mouth daily.     NIACIN 500 MG CR CAPSULE    Take 500 mg by mouth 2 (two) times daily.    OMEPRAZOLE (PRILOSEC) 20 MG CAPSULE    Take 1 capsule (20 mg total) by mouth daily.   SERTRALINE (ZOLOFT) 25 MG TABLET    TAKE 1 TABLET (25 MG TOTAL) BY MOUTH DAILY.    SILDENAFIL (VIAGRA) 100 MG TABLET    Take as directed   TESTOSTERONE ENANTHATE (DELATESTRYL) 200 MG/ML INJECTION    Inject into the muscle every 14 (fourteen) days. For IM use only  Modified Medications  Modified Medication Previous Medication   ALPRAZOLAM (XANAX) 0.5 MG TABLET ALPRAZolam (XANAX) 0.5 MG tablet      TAKE 1 TABLET BY MOUTH THREE TIMES DAILYAS NEEDED FOR ANXIETY    TAKE 1 TABLET BY MOUTH THREE TIMES DAILYAS NEEDED FOR ANXIETY  Discontinued Medications   DICYCLOMINE (BENTYL) 20 MG TABLET    Take 1 tablet (20 mg total) by mouth every 8 (eight) hours as needed for spasms (abd cramping).   MELOXICAM (MOBIC) 15 MG TABLET    Take 1 tablet (15 mg total) by mouth daily as needed for pain.

## 2018-04-10 NOTE — Telephone Encounter (Signed)
Results called to pt. Pt verbalized understanding.  

## 2018-05-16 ENCOUNTER — Other Ambulatory Visit: Payer: Self-pay | Admitting: Cardiovascular Disease

## 2018-05-20 ENCOUNTER — Other Ambulatory Visit: Payer: Self-pay | Admitting: Cardiovascular Disease

## 2018-05-22 NOTE — H&P (Signed)
TOTAL KNEE ADMISSION H&P  Patient is being admitted for left total knee arthroplasty.  Subjective:  Chief Complaint:left knee pain.  HPI: Dakota Ellison, 67 y.o. male, has a history of pain and functional disability in the left knee due to arthritis and has failed non-surgical conservative treatments for greater than 12 weeks to includecorticosteriod injections, viscosupplementation injections and activity modification.  Onset of symptoms was gradual, starting several years ago with gradually worsening course since that time. The patient noted prior procedures on the knee to include  arthroscopy on the left knee(s).  Patient currently rates pain in the left knee(s) at 8 out of 10 with activity. Patient has worsening of pain with activity and weight bearing, pain that interferes with activities of daily living and joint swelling.  Patient has evidence of medial and patellofemoral bone-on-bone arthritis by imaging studies. There is no active infection.  Patient Active Problem List   Diagnosis Date Noted  . Low back pain 04/11/2015  . Wellness examination 10/07/2014  . Anxiety state 10/07/2014  . Left knee pain 03/17/2014  . Impaired glucose tolerance 10/04/2013  . Chest pain, atypical 03/13/2013  . Inguinal hernia unilateral, non-recurrent, left 10/17/2012  . Right groin pain, hx of RIH repair with mesh 10/17/2012  . Bladder neck obstruction 10/04/2010  . SKIN LESION 04/06/2009  . Obstructive sleep apnea on CPAP 10/06/2008  . RECTAL FISSURE 05/14/2008  . Testosterone deficiency 10/02/2007  . Hyperlipidemia 07/13/2007  . Morbid obesity (HCC) 07/13/2007  . DYSTHYMIA 07/13/2007  . ERECTILE DYSFUNCTION 07/13/2007  . Essential hypertension 07/13/2007  . Lumbar spondylosis 07/13/2007  . Asthma 07/12/2007  . GERD 07/12/2007  . Osteoarthritis 07/12/2007   Past Medical History:  Diagnosis Date  . Anxiety state 04/08/2014  . Degenerative disc disease   . Dysthymia   . ED (erectile  dysfunction)   . GERD (gastroesophageal reflux disease)   . History of MRSA infection   . Hyperlipemia   . Hypertension   . Mild asthma   . Morbid obesity (HCC)   . OSA (obstructive sleep apnea)   . Osteoarthritis   . Rectal fissure   . Testosterone deficiency     Past Surgical History:  Procedure Laterality Date  . KNEE SURGERY  09/2010   left knee  . OTHER SURGICAL HISTORY     arthroscopic subacromial decompression  . OTHER SURGICAL HISTORY     open resection, distal right clavicle  . SHOULDER ARTHROSCOPY     right  . SPHINCTEROTOMY      No current facility-administered medications for this encounter.    Current Outpatient Medications  Medication Sig Dispense Refill Last Dose  . ALPRAZolam (XANAX) 0.5 MG tablet TAKE 1 TABLET BY MOUTH THREE TIMES DAILYAS NEEDED FOR ANXIETY 90 tablet 3   . amLODIPine-Valsartan-HCTZ 10-320-25 MG TABS TAKE 1 TABLET BY MOUTH EVERY DAY 90 tablet 4   . APPLE CIDER VINEGAR PO Take by mouth daily.   Taking  . aspirin 81 MG tablet Take 81 mg by mouth daily.     Taking  . CINNAMON PO Take 200 mg by mouth daily.   Taking  . ezetimibe (ZETIA) 10 MG tablet Take 1 tablet (10 mg total) by mouth daily. 90 tablet 3 Taking  . GARLIC PO Take 0,865 mg by mouth daily.   Taking  . Ginkgo Biloba 120 MG CAPS Take 120 mg by mouth daily.    Taking  . HYDROcodone-acetaminophen (NORCO/VICODIN) 5-325 MG tablet Take 1 tablet by mouth every 6 (six) hours  as needed for moderate pain. Given by Dr. Channing Mutters   Taking  . hydrocortisone (PROCTOSOL HC) 2.5 % rectal cream Apply as directed after each BM    Taking  . Multiple Vitamin (MULTIVITAMIN) tablet Take 1 tablet by mouth daily.     Taking  . niacin 500 MG CR capsule Take 500 mg by mouth 2 (two) times daily.    Taking  . omeprazole (PRILOSEC) 20 MG capsule TAKE 1 CAPSULE BY MOUTH EVERY DAY 90 capsule 3   . sertraline (ZOLOFT) 25 MG tablet TAKE 1 TABLET (25 MG TOTAL) BY MOUTH DAILY. 90 tablet 2 Taking  . sildenafil (VIAGRA) 100 MG  tablet Take as directed 10 tablet 5 Taking  . testosterone enanthate (DELATESTRYL) 200 MG/ML injection Inject into the muscle every 14 (fourteen) days. For IM use only   Taking   Allergies  Allergen Reactions  . Atorvastatin Other (See Comments)    Muscle weakness in his legs  . Crestor [Rosuvastatin] Other (See Comments)    Severe muscle cramping  . Zocor [Simvastatin] Other (See Comments)    Muscle weakness in his legs    Social History   Tobacco Use  . Smoking status: Never Smoker  . Smokeless tobacco: Never Used  . Tobacco comment: CIGARS ONLY--QUIT 2013  Substance Use Topics  . Alcohol use: No    Alcohol/week: 0.0 standard drinks    Comment: rare//occassional    Family History  Problem Relation Age of Onset  . Pneumonia Brother      Review of Systems  Constitutional: Negative for chills and fever.  HENT: Negative for congestion, sore throat and tinnitus.   Eyes: Negative for double vision, photophobia and pain.  Respiratory: Negative for cough, shortness of breath and wheezing.   Cardiovascular: Negative for chest pain, palpitations and orthopnea.  Gastrointestinal: Negative for heartburn, nausea and vomiting.  Genitourinary: Negative for dysuria, frequency and urgency.  Musculoskeletal: Positive for joint pain.  Neurological: Negative for dizziness, weakness and headaches.    Objective:  Physical Exam  Well nourished and well developed.  General: Alert and oriented x3, cooperative and pleasant, no acute distress.  Head: normocephalic, atraumatic, neck supple.  Eyes: EOMI.  Respiratory: breath sounds clear in all fields, no wheezing, rales, or rhonchi. Cardiovascular: Regular rate and rhythm, no murmurs, gallops or rubs.  Abdomen: non-tender to palpation and soft, normoactive bowel sounds. Musculoskeletal: Left Knee Exam: No effusion. Slight varus deformity. Range of motion is 0-125 degrees. No crepitus on range of motion of the knee. Positive medial joint line  tenderness. No lateral joint line tenderness.  Stable knee. Calves soft and nontender. Motor function intact in LE. Strength 5/5 LE bilaterally. Neuro: Distal pulses 2+. Sensation to light touch intact in LE.  Vital signs in last 24 hours: Blood pressure: 136/88 mmHg Pulse: 64 bpm  Labs:   Estimated body mass index is 38.54 kg/m as calculated from the following:   Height as of 04/09/18: 6' (1.829 m).   Weight as of 04/09/18: 128.9 kg.   Imaging Review Plain radiographs demonstrate severe degenerative joint disease of the left knee(s). The overall alignment ismild varus. The bone quality appears to be adequate for age and reported activity level.   Preoperative templating of the joint replacement has been completed, documented, and submitted to the Operating Room personnel in order to optimize intra-operative equipment management.   Anticipated LOS equal to or greater than 2 midnights due to - Age 32 and older with one or more of the following:  -  Obesity  - Expected need for hospital services (PT, OT, Nursing) required for safe  discharge  - Anticipated need for postoperative skilled nursing care or inpatient rehab  - Active co-morbidities: None OR   - Unanticipated findings during/Post Surgery: None  - Patient is a high risk of re-admission due to: None     Assessment/Plan:  End stage arthritis, left knee   The patient history, physical examination, clinical judgment of the provider and imaging studies are consistent with end stage degenerative joint disease of the left knee(s) and total knee arthroplasty is deemed medically necessary. The treatment options including medical management, injection therapy arthroscopy and arthroplasty were discussed at length. The risks and benefits of total knee arthroplasty were presented and reviewed. The risks due to aseptic loosening, infection, stiffness, patella tracking problems, thromboembolic complications and other imponderables  were discussed. The patient acknowledged the explanation, agreed to proceed with the plan and consent was signed. Patient is being admitted for inpatient treatment for surgery, pain control, PT, OT, prophylactic antibiotics, VTE prophylaxis, progressive ambulation and ADL's and discharge planning. The patient is planning to be discharged home.   [facility] Disposition: Home with son Planned DVT Prophylaxis: Aspirin 325 mg BID DME needed: Dan HumphreysWalker PCP: Dr. Kriste BasqueNadel Cardiologist: Dr. Mariah MillingGollan TXA: IV Allergies: NKDA Anesthesia Concerns: Difficulty awakening, sleep apnea BMI: 39.2  - Patient was instructed on what medications to stop prior to surgery. - Follow-up visit in 2 weeks with Dr. Lequita HaltAluisio - Begin physical therapy following surgery - Pre-operative lab work as pre-surgical testing - Prescriptions will be provided in hospital at time of discharge  Arther AbbottKristie Tabytha Gradillas, PA-C Orthopedic Surgery [facility] [place]

## 2018-06-07 NOTE — Patient Instructions (Signed)
Dakota Ellison  06/07/2018   Your procedure is scheduled on: 06-11-18  Report to [facility] Main  Entrance  Report to admitting at       0900 AM    Call this number if you have problems the morning of surgery [phone]    Remember: Do not eat food or drink liquids :After Midnight.  BRUSH YOUR TEETH MORNING OF SURGERY AND RINSE YOUR MOUTH OUT, NO CHEWING GUM CANDY OR MINTS.     Take these medicines the morning of surgery with A SIP OF WATER: ZOLOFT, OMEPRAZOLE, XANAX IF NEEDED                                You may not have any metal on your body including hair pins and              piercings  Do not wear jewelry,  lotions, powders or perfumes, deodorant       .              Men may shave face and neck.   Do not bring valuables to the hospital.  IS NOT             RESPONSIBLE   FOR VALUABLES.  Contacts, dentures or bridgework may not be worn into surgery.  Leave suitcase in the car. After surgery it may be brought to your room.      Name and phone number of your driver:  Special Instructions: N/A              Please read over the following fact sheets you were given: _____________________________________________________________________          [facility] - Preparing for Surgery Before surgery, you can play an important role.  Because skin is not sterile, your skin needs to be as free of germs as possible.  You can reduce the number of germs on your skin by washing with CHG (chlorahexidine gluconate) soap before surgery.  CHG is an antiseptic cleaner which kills germs and bonds with the skin to continue killing germs even after washing. Please DO NOT use if you have an allergy to CHG or antibacterial soaps.  If your skin becomes reddened/irritated stop using the CHG and inform your nurse when you arrive at Short Stay. Do not shave (including legs and underarms) for at least 48 hours prior to the first CHG shower.  You may shave your  face/neck. Please follow these instructions carefully:  1.  Shower with CHG Soap the night before surgery and the  morning of Surgery.  2.  If you choose to wash your hair, wash your hair first as usual with your  normal  shampoo.  3.  After you shampoo, rinse your hair and body thoroughly to remove the  shampoo.                           4.  Use CHG as you would any other liquid soap.  You can apply chg directly  to the skin and wash                       Gently with a scrungie or clean washcloth.  5.  Apply the CHG Soap to your body ONLY FROM THE NECK  DOWN.   Do not use on face/ open                           Wound or open sores. Avoid contact with eyes, ears mouth and genitals (private parts).                       Wash face,  Genitals (private parts) with your normal soap.             6.  Wash thoroughly, paying special attention to the area where your surgery  will be performed.  7.  Thoroughly rinse your body with warm water from the neck down.  8.  DO NOT shower/wash with your normal soap after using and rinsing off  the CHG Soap.                9.  Pat yourself dry with a clean towel.            10.  Wear clean pajamas.            11.  Place clean sheets on your bed the night of your first shower and do not  sleep with pets. Day of Surgery : Do not apply any lotions/deodorants the morning of surgery.  Please wear clean clothes to the hospital/surgery center.  FAILURE TO FOLLOW THESE INSTRUCTIONS MAY RESULT IN THE CANCELLATION OF YOUR SURGERY PATIENT SIGNATURE_________________________________  NURSE SIGNATURE__________________________________  ________________________________________________________________________  WHAT IS A BLOOD TRANSFUSION? Blood Transfusion Information  A transfusion is the replacement of blood or some of its parts. Blood is made up of multiple cells which provide different functions.  Red blood cells carry oxygen and are used for blood loss  replacement.  White blood cells fight against infection.  Platelets control bleeding.  Plasma helps clot blood.  Other blood products are available for specialized needs, such as hemophilia or other clotting disorders. BEFORE THE TRANSFUSION  Who gives blood for transfusions?   Healthy volunteers who are fully evaluated to make sure their blood is safe. This is blood bank blood. Transfusion therapy is the safest it has ever been in the practice of medicine. Before blood is taken from a donor, a complete history is taken to make sure that person has no history of diseases nor engages in risky social behavior (examples are intravenous drug use or sexual activity with multiple partners). The donor's travel history is screened to minimize risk of transmitting infections, such as malaria. The donated blood is tested for signs of infectious diseases, such as HIV and hepatitis. The blood is then tested to be sure it is compatible with you in order to minimize the chance of a transfusion reaction. If you or a relative donates blood, this is often done in anticipation of surgery and is not appropriate for emergency situations. It takes many days to process the donated blood. RISKS AND COMPLICATIONS Although transfusion therapy is very safe and saves many lives, the main dangers of transfusion include:   Getting an infectious disease.  Developing a transfusion reaction. This is an allergic reaction to something in the blood you were given. Every precaution is taken to prevent this. The decision to have a blood transfusion has been considered carefully by your caregiver before blood is given. Blood is not given unless the benefits outweigh the risks. AFTER THE TRANSFUSION  Right after receiving a blood transfusion, you will usually feel much better and more energetic.  This is especially true if your red blood cells have gotten low (anemic). The transfusion raises the level of the red blood cells which  carry oxygen, and this usually causes an energy increase.  The nurse administering the transfusion will monitor you carefully for complications. HOME CARE INSTRUCTIONS  No special instructions are needed after a transfusion. You may find your energy is better. Speak with your caregiver about any limitations on activity for underlying diseases you may have. SEEK MEDICAL CARE IF:   Your condition is not improving after your transfusion.  You develop redness or irritation at the intravenous (IV) site. SEEK IMMEDIATE MEDICAL CARE IF:  Any of the following symptoms occur over the next 12 hours:  Shaking chills.  You have a temperature by mouth above 102 F (38.9 C), not controlled by medicine.  Chest, back, or muscle pain.  People around you feel you are not acting correctly or are confused.  Shortness of breath or difficulty breathing.  Dizziness and fainting.  You get a rash or develop hives.  You have a decrease in urine output.  Your urine turns a dark color or changes to pink, red, or brown. Any of the following symptoms occur over the next 10 days:  You have a temperature by mouth above 102 F (38.9 C), not controlled by medicine.  Shortness of breath.  Weakness after normal activity.  The white part of the eye turns yellow (jaundice).  You have a decrease in the amount of urine or are urinating less often.  Your urine turns a dark color or changes to pink, red, or brown. Document Released: 05/20/2000 Document Revised: 08/15/2011 Document Reviewed: 01/07/2008 ExitCare Patient Information 2014 [place], [place].  _______________________________________________________________________  Incentive Spirometer  An incentive spirometer is a tool that can help keep your lungs clear and active. This tool measures how well you are filling your lungs with each breath. Taking long deep breaths may help reverse or decrease the chance of developing breathing (pulmonary) problems  (especially infection) following:  A long period of time when you are unable to move or be active. BEFORE THE PROCEDURE   If the spirometer includes an indicator to show your best effort, your nurse or respiratory therapist will set it to a desired goal.  If possible, sit up straight or lean slightly forward. Try not to slouch.  Hold the incentive spirometer in an upright position. INSTRUCTIONS FOR USE  1. Sit on the edge of your bed if possible, or sit up as far as you can in bed or on a chair. 2. Hold the incentive spirometer in an upright position. 3. Breathe out normally. 4. Place the mouthpiece in your mouth and seal your lips tightly around it. 5. Breathe in slowly and as deeply as possible, raising the piston or the ball toward the top of the column. 6. Hold your breath for 3-5 seconds or for as long as possible. Allow the piston or ball to fall to the bottom of the column. 7. Remove the mouthpiece from your mouth and breathe out normally. 8. Rest for a few seconds and repeat Steps 1 through 7 at least 10 times every 1-2 hours when you are awake. Take your time and take a few normal breaths between deep breaths. 9. The spirometer may include an indicator to show your best effort. Use the indicator as a goal to work toward during each repetition. 10. After each set of 10 deep breaths, practice coughing to be sure your lungs are clear.  If you have an incision (the cut made at the time of surgery), support your incision when coughing by placing a pillow or rolled up towels firmly against it. Once you are able to get out of bed, walk around indoors and cough well. You may stop using the incentive spirometer when instructed by your caregiver.  RISKS AND COMPLICATIONS  Take your time so you do not get dizzy or light-headed.  If you are in pain, you may need to take or ask for pain medication before doing incentive spirometry. It is harder to take a deep breath if you are having  pain. AFTER USE  Rest and breathe slowly and easily.  It can be helpful to keep track of a log of your progress. Your caregiver can provide you with a simple table to help with this. If you are using the spirometer at home, follow these instructions: [place] IF:   You are having difficultly using the spirometer.  You have trouble using the spirometer as often as instructed.  Your pain medication is not giving enough relief while using the spirometer.  You develop fever of 100.5 F (38.1 C) or higher. SEEK IMMEDIATE MEDICAL CARE IF:   You cough up bloody sputum that had not been present before.  You develop fever of 102 F (38.9 C) or greater.  You develop worsening pain at or near the incision site. MAKE SURE YOU:   Understand these instructions.  Will watch your condition.  Will get help right away if you are not doing well or get worse. Document Released: 10/03/2006 Document Revised: 08/15/2011 Document Reviewed: 12/04/2006 [facility] Patient Information 2014 [place], [place].   ________________________________________________________________________

## 2018-06-08 ENCOUNTER — Encounter (HOSPITAL_COMMUNITY): Payer: Self-pay

## 2018-06-08 ENCOUNTER — Other Ambulatory Visit: Payer: Self-pay

## 2018-06-08 ENCOUNTER — Encounter (HOSPITAL_COMMUNITY)
Admission: RE | Admit: 2018-06-08 | Discharge: 2018-06-08 | Disposition: A | Discharge status: Home / Self Care | Payer: PPO | Source: Ambulatory Visit | Attending: Orthopedic Surgery | Admitting: Orthopedic Surgery

## 2018-06-08 DIAGNOSIS — I10 Essential (primary) hypertension: Secondary | ICD-10-CM | POA: Diagnosis present

## 2018-06-08 DIAGNOSIS — Z01812 Encounter for preprocedural laboratory examination: Secondary | ICD-10-CM

## 2018-06-08 DIAGNOSIS — G4733 Obstructive sleep apnea (adult) (pediatric): Secondary | ICD-10-CM | POA: Diagnosis present

## 2018-06-08 DIAGNOSIS — Z79899 Other long term (current) drug therapy: Secondary | ICD-10-CM | POA: Diagnosis not present

## 2018-06-08 DIAGNOSIS — N529 Male erectile dysfunction, unspecified: Secondary | ICD-10-CM | POA: Diagnosis present

## 2018-06-08 DIAGNOSIS — G8918 Other acute postprocedural pain: Secondary | ICD-10-CM | POA: Diagnosis not present

## 2018-06-08 DIAGNOSIS — Z8614 Personal history of Methicillin resistant Staphylococcus aureus infection: Secondary | ICD-10-CM | POA: Diagnosis not present

## 2018-06-08 DIAGNOSIS — K219 Gastro-esophageal reflux disease without esophagitis: Secondary | ICD-10-CM | POA: Diagnosis present

## 2018-06-08 DIAGNOSIS — M25762 Osteophyte, left knee: Secondary | ICD-10-CM | POA: Diagnosis present

## 2018-06-08 DIAGNOSIS — M1712 Unilateral primary osteoarthritis, left knee: Secondary | ICD-10-CM

## 2018-06-08 DIAGNOSIS — E785 Hyperlipidemia, unspecified: Secondary | ICD-10-CM | POA: Diagnosis not present

## 2018-06-08 DIAGNOSIS — Z7982 Long term (current) use of aspirin: Secondary | ICD-10-CM | POA: Diagnosis not present

## 2018-06-08 DIAGNOSIS — R861 Abnormal level of hormones in specimens from male genital organs: Secondary | ICD-10-CM | POA: Diagnosis present

## 2018-06-08 DIAGNOSIS — Z6838 Body mass index (BMI) 38.0-38.9, adult: Secondary | ICD-10-CM | POA: Diagnosis not present

## 2018-06-08 DIAGNOSIS — Z87891 Personal history of nicotine dependence: Secondary | ICD-10-CM | POA: Diagnosis not present

## 2018-06-08 DIAGNOSIS — F419 Anxiety disorder, unspecified: Secondary | ICD-10-CM | POA: Diagnosis present

## 2018-06-08 DIAGNOSIS — R269 Unspecified abnormalities of gait and mobility: Secondary | ICD-10-CM | POA: Diagnosis not present

## 2018-06-08 DIAGNOSIS — Z79891 Long term (current) use of opiate analgesic: Secondary | ICD-10-CM | POA: Diagnosis not present

## 2018-06-08 DIAGNOSIS — Z888 Allergy status to other drugs, medicaments and biological substances status: Secondary | ICD-10-CM | POA: Diagnosis not present

## 2018-06-08 HISTORY — DX: Other complications of anesthesia, initial encounter: T88.59XA

## 2018-06-08 HISTORY — DX: Nausea with vomiting, unspecified: R11.2

## 2018-06-08 HISTORY — DX: Other specified postprocedural states: Z98.890

## 2018-06-08 HISTORY — DX: Adverse effect of unspecified anesthetic, initial encounter: T41.45XA

## 2018-06-08 LAB — ABO/RH: ABO/RH(D): A NEG

## 2018-06-08 LAB — CBC
HEMATOCRIT: 49.7 % (ref 39.0–52.0)
HEMOGLOBIN: 16.5 g/dL (ref 13.0–17.0)
MCH: 28.4 pg (ref 26.0–34.0)
MCHC: 33.2 g/dL (ref 30.0–36.0)
MCV: 85.4 fL (ref 80.0–100.0)
NRBC: 0 % (ref 0.0–0.2)
Platelets: 261 10*3/uL (ref 150–400)
RBC: 5.82 MIL/uL — AB (ref 4.22–5.81)
RDW: 13.6 % (ref 11.5–15.5)
WBC: 11.2 10*3/uL — AB (ref 4.0–10.5)

## 2018-06-08 LAB — COMPREHENSIVE METABOLIC PANEL
ALBUMIN: 4.2 g/dL (ref 3.5–5.0)
ALK PHOS: 73 U/L (ref 38–126)
ALT: 29 U/L (ref 0–44)
AST: 34 U/L (ref 15–41)
Anion gap: 12 (ref 5–15)
BILIRUBIN TOTAL: 0.7 mg/dL (ref 0.3–1.2)
BUN: 16 mg/dL (ref 8–23)
CO2: 24 mmol/L (ref 22–32)
Calcium: 9.3 mg/dL (ref 8.9–10.3)
Chloride: 102 mmol/L (ref 98–111)
Creatinine, Ser: 1.09 mg/dL (ref 0.61–1.24)
GFR calc Af Amer: >60 mL/min (ref >60)
GFR calc non Af Amer: >60 mL/min (ref >60)
GLUCOSE: 89 mg/dL (ref 70–99)
POTASSIUM: 4.3 mmol/L (ref 3.5–5.1)
Sodium: 138 mmol/L (ref 135–145)
TOTAL PROTEIN: 7.3 g/dL (ref 6.5–8.1)

## 2018-06-08 LAB — PROTIME-INR
INR: 0.86
Prothrombin Time: 11.6 seconds (ref 11.4–15.2)

## 2018-06-08 LAB — SURGICAL PCR SCREEN
MRSA, PCR: NEGATIVE
STAPHYLOCOCCUS AUREUS: NEGATIVE

## 2018-06-08 LAB — APTT: aPTT: 29 s (ref 24–36)

## 2018-06-08 NOTE — Progress Notes (Signed)
[name] 04-02-18 on chart  ekg 04-02-18 -epic CT cardiac 04-04-18 - epic

## 2018-06-10 MED ORDER — DEXTROSE 5 % IV SOLN
3.0000 g | INTRAVENOUS | Status: AC
  Administered 2018-06-11: 3 g via INTRAVENOUS
  Filled 2018-06-10: qty 3000

## 2018-06-10 MED ORDER — BUPIVACAINE LIPOSOME 1.3 % IJ SUSP
20.0000 mL | Freq: Once | INTRAMUSCULAR | Status: DC
  Filled 2018-06-10: qty 20

## 2018-06-11 ENCOUNTER — Inpatient Hospital Stay (HOSPITAL_COMMUNITY)
Admission: RE | Admit: 2018-06-11 | Discharge: 2018-06-12 | DRG: 470 | Disposition: A | Discharge status: Home / Self Care | Payer: PPO | Attending: Orthopedic Surgery | Admitting: Orthopedic Surgery

## 2018-06-11 ENCOUNTER — Other Ambulatory Visit: Payer: Self-pay

## 2018-06-11 ENCOUNTER — Inpatient Hospital Stay (HOSPITAL_COMMUNITY): Payer: PPO | Admitting: Physician Assistant

## 2018-06-11 ENCOUNTER — Encounter (HOSPITAL_COMMUNITY): Payer: Self-pay | Admitting: *Deleted

## 2018-06-11 ENCOUNTER — Encounter (HOSPITAL_COMMUNITY)
Admission: RE | Disposition: A | Discharge status: Home / Self Care | Payer: Self-pay | Source: Home / Self Care | Attending: Orthopedic Surgery

## 2018-06-11 ENCOUNTER — Inpatient Hospital Stay (HOSPITAL_COMMUNITY): Payer: PPO | Admitting: Anesthesiology

## 2018-06-11 DIAGNOSIS — M1712 Unilateral primary osteoarthritis, left knee: Secondary | ICD-10-CM | POA: Diagnosis present

## 2018-06-11 DIAGNOSIS — K219 Gastro-esophageal reflux disease without esophagitis: Secondary | ICD-10-CM | POA: Diagnosis present

## 2018-06-11 DIAGNOSIS — Z888 Allergy status to other drugs, medicaments and biological substances status: Secondary | ICD-10-CM | POA: Diagnosis not present

## 2018-06-11 DIAGNOSIS — Z87891 Personal history of nicotine dependence: Secondary | ICD-10-CM | POA: Diagnosis not present

## 2018-06-11 DIAGNOSIS — Z79891 Long term (current) use of opiate analgesic: Secondary | ICD-10-CM

## 2018-06-11 DIAGNOSIS — M179 Osteoarthritis of knee, unspecified: Secondary | ICD-10-CM | POA: Diagnosis present

## 2018-06-11 DIAGNOSIS — R861 Abnormal level of hormones in specimens from male genital organs: Secondary | ICD-10-CM | POA: Diagnosis present

## 2018-06-11 DIAGNOSIS — G4733 Obstructive sleep apnea (adult) (pediatric): Secondary | ICD-10-CM | POA: Diagnosis present

## 2018-06-11 DIAGNOSIS — Z6838 Body mass index (BMI) 38.0-38.9, adult: Secondary | ICD-10-CM | POA: Diagnosis not present

## 2018-06-11 DIAGNOSIS — Z8614 Personal history of Methicillin resistant Staphylococcus aureus infection: Secondary | ICD-10-CM

## 2018-06-11 DIAGNOSIS — Z79899 Other long term (current) drug therapy: Secondary | ICD-10-CM

## 2018-06-11 DIAGNOSIS — M1710 Unilateral primary osteoarthritis, unspecified knee: Secondary | ICD-10-CM | POA: Diagnosis present

## 2018-06-11 DIAGNOSIS — I10 Essential (primary) hypertension: Secondary | ICD-10-CM | POA: Diagnosis present

## 2018-06-11 DIAGNOSIS — M25762 Osteophyte, left knee: Secondary | ICD-10-CM | POA: Diagnosis present

## 2018-06-11 DIAGNOSIS — N529 Male erectile dysfunction, unspecified: Secondary | ICD-10-CM | POA: Diagnosis present

## 2018-06-11 DIAGNOSIS — F419 Anxiety disorder, unspecified: Secondary | ICD-10-CM | POA: Diagnosis present

## 2018-06-11 DIAGNOSIS — Z7982 Long term (current) use of aspirin: Secondary | ICD-10-CM

## 2018-06-11 HISTORY — PX: TOTAL KNEE ARTHROPLASTY: SHX125

## 2018-06-11 LAB — TYPE AND SCREEN
ABO/RH(D): A NEG
Antibody Screen: NEGATIVE

## 2018-06-11 SURGERY — ARTHROPLASTY, KNEE, TOTAL
Anesthesia: Spinal | Site: Knee | Laterality: Left

## 2018-06-11 MED ORDER — BUPIVACAINE LIPOSOME 1.3 % IJ SUSP
INTRAMUSCULAR | Status: DC | PRN
  Administered 2018-06-11: 20 mL

## 2018-06-11 MED ORDER — IRBESARTAN 150 MG PO TABS
300.0000 mg | ORAL_TABLET | Freq: Every day | ORAL | Status: DC
  Filled 2018-06-11: qty 2

## 2018-06-11 MED ORDER — ONDANSETRON HCL 4 MG/2ML IJ SOLN
INTRAMUSCULAR | Status: AC
  Filled 2018-06-11: qty 2

## 2018-06-11 MED ORDER — TRAMADOL HCL 50 MG PO TABS: 50.0000 mg | ORAL_TABLET | Freq: Four times a day (QID) | ORAL | Status: DC | PRN

## 2018-06-11 MED ORDER — SODIUM CHLORIDE 0.9 % IV SOLN
INTRAVENOUS | Status: DC
  Administered 2018-06-11 – 2018-06-12 (×2): via INTRAVENOUS

## 2018-06-11 MED ORDER — MENTHOL 3 MG MT LOZG: 1.0000 | LOZENGE | OROMUCOSAL | Status: DC | PRN

## 2018-06-11 MED ORDER — DEXAMETHASONE SODIUM PHOSPHATE 10 MG/ML IJ SOLN
10.0000 mg | Freq: Once | INTRAMUSCULAR | Status: AC
  Administered 2018-06-12: 10 mg via INTRAVENOUS
  Filled 2018-06-11: qty 1

## 2018-06-11 MED ORDER — ASPIRIN EC 325 MG PO TBEC
325.0000 mg | DELAYED_RELEASE_TABLET | Freq: Two times a day (BID) | ORAL | Status: DC
  Administered 2018-06-12: 325 mg via ORAL
  Filled 2018-06-11: qty 1

## 2018-06-11 MED ORDER — MIDAZOLAM HCL 2 MG/2ML IJ SOLN
1.0000 mg | INTRAMUSCULAR | Status: DC
  Administered 2018-06-11: 2 mg via INTRAVENOUS
  Filled 2018-06-11: qty 2

## 2018-06-11 MED ORDER — FLEET ENEMA 7-19 GM/118ML RE ENEM: 1.0000 | ENEMA | Freq: Once | RECTAL | Status: DC | PRN

## 2018-06-11 MED ORDER — METHOCARBAMOL 500 MG PO TABS
500.0000 mg | ORAL_TABLET | Freq: Four times a day (QID) | ORAL | Status: DC | PRN
  Administered 2018-06-11 – 2018-06-12 (×3): 500 mg via ORAL
  Filled 2018-06-11 (×3): qty 1

## 2018-06-11 MED ORDER — HYDROMORPHONE HCL 1 MG/ML IJ SOLN: 0.2500 mg | INTRAMUSCULAR | Status: DC | PRN

## 2018-06-11 MED ORDER — CEFAZOLIN SODIUM-DEXTROSE 2-4 GM/100ML-% IV SOLN
2.0000 g | Freq: Four times a day (QID) | INTRAVENOUS | Status: AC
  Administered 2018-06-11 (×2): 2 g via INTRAVENOUS
  Filled 2018-06-11 (×2): qty 100

## 2018-06-11 MED ORDER — SODIUM CHLORIDE (PF) 0.9 % IJ SOLN
INTRAMUSCULAR | Status: AC
  Filled 2018-06-11: qty 10

## 2018-06-11 MED ORDER — BUPIVACAINE IN DEXTROSE 0.75-8.25 % IT SOLN
INTRATHECAL | Status: DC | PRN
  Administered 2018-06-11: 1.8 mL via INTRATHECAL

## 2018-06-11 MED ORDER — HYDROCHLOROTHIAZIDE 25 MG PO TABS
25.0000 mg | ORAL_TABLET | Freq: Every day | ORAL | Status: DC
  Filled 2018-06-11: qty 1

## 2018-06-11 MED ORDER — PHENYLEPHRINE 40 MCG/ML (10ML) SYRINGE FOR IV PUSH (FOR BLOOD PRESSURE SUPPORT)
PREFILLED_SYRINGE | INTRAVENOUS | Status: AC
  Filled 2018-06-11: qty 10

## 2018-06-11 MED ORDER — GABAPENTIN 300 MG PO CAPS
300.0000 mg | ORAL_CAPSULE | Freq: Once | ORAL | Status: AC
  Administered 2018-06-11: 300 mg via ORAL
  Filled 2018-06-11: qty 1

## 2018-06-11 MED ORDER — TRANEXAMIC ACID-NACL 1000-0.7 MG/100ML-% IV SOLN
1000.0000 mg | Freq: Once | INTRAVENOUS | Status: AC
  Administered 2018-06-11: 1000 mg via INTRAVENOUS
  Filled 2018-06-11: qty 100

## 2018-06-11 MED ORDER — DEXAMETHASONE SODIUM PHOSPHATE 10 MG/ML IJ SOLN
INTRAMUSCULAR | Status: AC
  Filled 2018-06-11: qty 1

## 2018-06-11 MED ORDER — PROPOFOL 10 MG/ML IV BOLUS
INTRAVENOUS | Status: DC | PRN
  Administered 2018-06-11: 40 mg via INTRAVENOUS
  Administered 2018-06-11 (×3): 20 mg via INTRAVENOUS

## 2018-06-11 MED ORDER — CLONIDINE HCL (ANALGESIA) 100 MCG/ML EP SOLN
EPIDURAL | Status: DC | PRN
  Administered 2018-06-11: 70 ug

## 2018-06-11 MED ORDER — ACETAMINOPHEN 10 MG/ML IV SOLN
1000.0000 mg | Freq: Four times a day (QID) | INTRAVENOUS | Status: DC
  Administered 2018-06-11: 1000 mg via INTRAVENOUS
  Filled 2018-06-11: qty 100

## 2018-06-11 MED ORDER — OXYCODONE HCL 5 MG PO TABS: 10.0000 mg | ORAL_TABLET | ORAL | Status: DC | PRN

## 2018-06-11 MED ORDER — SODIUM CHLORIDE 0.9 % IV SOLN
INTRAVENOUS | Status: DC | PRN
  Administered 2018-06-11: 40 ug/min via INTRAVENOUS

## 2018-06-11 MED ORDER — PHENYLEPHRINE 40 MCG/ML (10ML) SYRINGE FOR IV PUSH (FOR BLOOD PRESSURE SUPPORT)
PREFILLED_SYRINGE | INTRAVENOUS | Status: DC | PRN
  Administered 2018-06-11 (×2): 100 ug via INTRAVENOUS

## 2018-06-11 MED ORDER — CHLORHEXIDINE GLUCONATE 4 % EX LIQD: 60.0000 mL | Freq: Once | CUTANEOUS | Status: DC

## 2018-06-11 MED ORDER — ONDANSETRON HCL 4 MG/2ML IJ SOLN: 4.0000 mg | Freq: Four times a day (QID) | INTRAMUSCULAR | Status: DC | PRN

## 2018-06-11 MED ORDER — SODIUM CHLORIDE 0.9 % IR SOLN
Status: DC | PRN
  Administered 2018-06-11: 1000 mL

## 2018-06-11 MED ORDER — PANTOPRAZOLE SODIUM 40 MG PO TBEC
40.0000 mg | DELAYED_RELEASE_TABLET | Freq: Every day | ORAL | Status: DC
  Administered 2018-06-12: 40 mg via ORAL
  Filled 2018-06-11: qty 1

## 2018-06-11 MED ORDER — AMLODIPINE-VALSARTAN-HCTZ 10-320-25 MG PO TABS: 1.0000 | ORAL_TABLET | Freq: Every day | ORAL | Status: DC

## 2018-06-11 MED ORDER — LACTATED RINGERS IV SOLN
INTRAVENOUS | Status: DC
  Administered 2018-06-11 (×2): via INTRAVENOUS

## 2018-06-11 MED ORDER — LIDOCAINE 2% (20 MG/ML) 5 ML SYRINGE
INTRAMUSCULAR | Status: DC | PRN
  Administered 2018-06-11: 20 mg via INTRAVENOUS

## 2018-06-11 MED ORDER — PROMETHAZINE HCL 25 MG/ML IJ SOLN: 6.2500 mg | INTRAMUSCULAR | Status: DC | PRN

## 2018-06-11 MED ORDER — ROPIVACAINE HCL 7.5 MG/ML IJ SOLN
INTRAMUSCULAR | Status: DC | PRN
  Administered 2018-06-11: 20 mL via PERINEURAL

## 2018-06-11 MED ORDER — MORPHINE SULFATE (PF) 2 MG/ML IV SOLN
1.0000 mg | INTRAVENOUS | Status: DC | PRN
  Administered 2018-06-11 – 2018-06-12 (×2): 1 mg via INTRAVENOUS
  Filled 2018-06-11 (×2): qty 1

## 2018-06-11 MED ORDER — DIPHENHYDRAMINE HCL 12.5 MG/5ML PO ELIX: 12.5000 mg | ORAL_SOLUTION | ORAL | Status: DC | PRN

## 2018-06-11 MED ORDER — SODIUM CHLORIDE (PF) 0.9 % IJ SOLN
INTRAMUSCULAR | Status: AC
  Filled 2018-06-11: qty 50

## 2018-06-11 MED ORDER — TRANEXAMIC ACID-NACL 1000-0.7 MG/100ML-% IV SOLN
1000.0000 mg | INTRAVENOUS | Status: AC
  Administered 2018-06-11: 1000 mg via INTRAVENOUS
  Filled 2018-06-11: qty 100

## 2018-06-11 MED ORDER — PHENYLEPHRINE HCL 10 MG/ML IJ SOLN
INTRAMUSCULAR | Status: AC
  Filled 2018-06-11: qty 1

## 2018-06-11 MED ORDER — METOCLOPRAMIDE HCL 5 MG/ML IJ SOLN: 5.0000 mg | Freq: Three times a day (TID) | INTRAMUSCULAR | Status: DC | PRN

## 2018-06-11 MED ORDER — PROPOFOL 10 MG/ML IV BOLUS
INTRAVENOUS | Status: AC
  Filled 2018-06-11: qty 20

## 2018-06-11 MED ORDER — PHENOL 1.4 % MT LIQD: 1.0000 | OROMUCOSAL | Status: DC | PRN

## 2018-06-11 MED ORDER — METOCLOPRAMIDE HCL 5 MG PO TABS: 5.0000 mg | ORAL_TABLET | Freq: Three times a day (TID) | ORAL | Status: DC | PRN

## 2018-06-11 MED ORDER — ACETAMINOPHEN 500 MG PO TABS
1000.0000 mg | ORAL_TABLET | Freq: Four times a day (QID) | ORAL | Status: AC
  Administered 2018-06-11 – 2018-06-12 (×4): 1000 mg via ORAL
  Filled 2018-06-11 (×4): qty 2

## 2018-06-11 MED ORDER — AMLODIPINE BESYLATE 10 MG PO TABS
10.0000 mg | ORAL_TABLET | Freq: Every day | ORAL | Status: DC
  Administered 2018-06-12: 10 mg via ORAL
  Filled 2018-06-11: qty 1

## 2018-06-11 MED ORDER — LIDOCAINE 2% (20 MG/ML) 5 ML SYRINGE
INTRAMUSCULAR | Status: AC
  Filled 2018-06-11: qty 5

## 2018-06-11 MED ORDER — FENTANYL CITRATE (PF) 100 MCG/2ML IJ SOLN
50.0000 ug | INTRAMUSCULAR | Status: DC
  Administered 2018-06-11 (×2): 50 ug via INTRAVENOUS
  Filled 2018-06-11: qty 2

## 2018-06-11 MED ORDER — SODIUM CHLORIDE (PF) 0.9 % IJ SOLN
INTRAMUSCULAR | Status: DC | PRN
  Administered 2018-06-11: 60 mL

## 2018-06-11 MED ORDER — PROPOFOL 500 MG/50ML IV EMUL
INTRAVENOUS | Status: DC | PRN
  Administered 2018-06-11: 75 ug/kg/min via INTRAVENOUS

## 2018-06-11 MED ORDER — SERTRALINE HCL 25 MG PO TABS
25.0000 mg | ORAL_TABLET | Freq: Every day | ORAL | Status: DC
  Administered 2018-06-12: 25 mg via ORAL
  Filled 2018-06-11: qty 1

## 2018-06-11 MED ORDER — PROPOFOL 10 MG/ML IV BOLUS
INTRAVENOUS | Status: AC
  Filled 2018-06-11: qty 60

## 2018-06-11 MED ORDER — 0.9 % SODIUM CHLORIDE (POUR BTL) OPTIME
TOPICAL | Status: DC | PRN
  Administered 2018-06-11: 1000 mL

## 2018-06-11 MED ORDER — EZETIMIBE 10 MG PO TABS
10.0000 mg | ORAL_TABLET | Freq: Every day | ORAL | Status: DC
  Administered 2018-06-12: 10 mg via ORAL
  Filled 2018-06-11: qty 1

## 2018-06-11 MED ORDER — DOCUSATE SODIUM 100 MG PO CAPS
100.0000 mg | ORAL_CAPSULE | Freq: Two times a day (BID) | ORAL | Status: DC
  Administered 2018-06-11 – 2018-06-12 (×2): 100 mg via ORAL
  Filled 2018-06-11 (×2): qty 1

## 2018-06-11 MED ORDER — OXYCODONE HCL 5 MG PO TABS
5.0000 mg | ORAL_TABLET | ORAL | Status: DC | PRN
  Administered 2018-06-11 – 2018-06-12 (×5): 10 mg via ORAL
  Filled 2018-06-11 (×5): qty 2

## 2018-06-11 MED ORDER — ONDANSETRON HCL 4 MG PO TABS: 4.0000 mg | ORAL_TABLET | Freq: Four times a day (QID) | ORAL | Status: DC | PRN

## 2018-06-11 MED ORDER — ONDANSETRON HCL 4 MG/2ML IJ SOLN
INTRAMUSCULAR | Status: DC | PRN
  Administered 2018-06-11: 4 mg via INTRAVENOUS

## 2018-06-11 MED ORDER — POLYETHYLENE GLYCOL 3350 17 G PO PACK: 17.0000 g | PACK | Freq: Every day | ORAL | Status: DC | PRN

## 2018-06-11 MED ORDER — METHOCARBAMOL 500 MG IVPB - SIMPLE MED
500.0000 mg | Freq: Four times a day (QID) | INTRAVENOUS | Status: DC | PRN
  Filled 2018-06-11: qty 50

## 2018-06-11 MED ORDER — GABAPENTIN 300 MG PO CAPS
300.0000 mg | ORAL_CAPSULE | Freq: Three times a day (TID) | ORAL | Status: DC
  Administered 2018-06-11 – 2018-06-12 (×4): 300 mg via ORAL
  Filled 2018-06-11 (×4): qty 1

## 2018-06-11 MED ORDER — DEXAMETHASONE SODIUM PHOSPHATE 10 MG/ML IJ SOLN
8.0000 mg | Freq: Once | INTRAMUSCULAR | Status: AC
  Administered 2018-06-11: 8 mg via INTRAVENOUS

## 2018-06-11 MED ORDER — ALPRAZOLAM 0.5 MG PO TABS: 0.5000 mg | ORAL_TABLET | Freq: Three times a day (TID) | ORAL | Status: DC | PRN

## 2018-06-11 MED ORDER — BISACODYL 10 MG RE SUPP: 10.0000 mg | Freq: Every day | RECTAL | Status: DC | PRN

## 2018-06-11 SURGICAL SUPPLY — 61 items
ATTUNE MED DOME PAT 41 KNEE (Knees) ×1 IMPLANT
ATTUNE MED DOME PAT 41MM KNEE (Knees) ×1 IMPLANT
ATTUNE PS FEM LT SZ 8 CEM KNEE (Femur) ×2 IMPLANT
ATTUNE PSRP INSR SZ8 8 KNEE (Insert) ×1 IMPLANT
ATTUNE PSRP INSR SZ8 8MM KNEE (Insert) ×1 IMPLANT
BAG ZIPLOCK 12X15 (MISCELLANEOUS) ×3 IMPLANT
BANDAGE ACE 4X5 VEL STRL LF (GAUZE/BANDAGES/DRESSINGS) ×2 IMPLANT
BANDAGE ACE 6X5 VEL STRL LF (GAUZE/BANDAGES/DRESSINGS) ×1 IMPLANT
BASE TIBIAL ROT PLAT SZ 8 KNEE (Knees) IMPLANT
BLADE SAG 18X100X1.27 (BLADE) ×3 IMPLANT
BLADE SAW SGTL 11.0X1.19X90.0M (BLADE) ×3 IMPLANT
BLADE SURG SZ10 CARB STEEL (BLADE) ×4 IMPLANT
BOWL SMART MIX CTS (DISPOSABLE) ×3 IMPLANT
CEMENT HV SMART SET (Cement) ×6 IMPLANT
CLOSURE WOUND 1/2 X4 (GAUZE/BANDAGES/DRESSINGS) ×2
COVER SURGICAL LIGHT HANDLE (MISCELLANEOUS) ×3 IMPLANT
COVER WAND RF STERILE (DRAPES) ×2 IMPLANT
CUFF TOURN SGL QUICK 34 (TOURNIQUET CUFF) ×2
CUFF TRNQT CYL 34X4X40X1 (TOURNIQUET CUFF) ×1 IMPLANT
DECANTER SPIKE VIAL GLASS SM (MISCELLANEOUS) ×3 IMPLANT
DRAPE U-SHAPE 47X51 STRL (DRAPES) ×3 IMPLANT
DRSG ADAPTIC 3X8 NADH LF (GAUZE/BANDAGES/DRESSINGS) ×3 IMPLANT
DRSG PAD ABDOMINAL 8X10 ST (GAUZE/BANDAGES/DRESSINGS) ×1 IMPLANT
DURAPREP 26ML APPLICATOR (WOUND CARE) ×3 IMPLANT
ELECT REM PT RETURN 15FT ADLT (MISCELLANEOUS) ×3 IMPLANT
EVACUATOR 1/8 PVC DRAIN (DRAIN) ×3 IMPLANT
GAUZE SPONGE 4X4 12PLY STRL (GAUZE/BANDAGES/DRESSINGS) ×3 IMPLANT
GLOVE BIO SURGEON STRL SZ7 (GLOVE) ×1 IMPLANT
GLOVE BIO SURGEON STRL SZ8 (GLOVE) ×3 IMPLANT
GLOVE BIOGEL PI IND STRL 6.5 (GLOVE) ×1 IMPLANT
GLOVE BIOGEL PI IND STRL 7.0 (GLOVE) ×1 IMPLANT
GLOVE BIOGEL PI IND STRL 8 (GLOVE) ×1 IMPLANT
GLOVE BIOGEL PI INDICATOR 6.5 (GLOVE) ×2
GLOVE BIOGEL PI INDICATOR 7.0 (GLOVE)
GLOVE BIOGEL PI INDICATOR 8 (GLOVE) ×2
GLOVE SURG SS PI 6.5 STRL IVOR (GLOVE) ×3 IMPLANT
GOWN STRL REUS W/TWL LRG LVL3 (GOWN DISPOSABLE) ×4 IMPLANT
GOWN STRL REUS W/TWL XL LVL3 (GOWN DISPOSABLE) ×3 IMPLANT
HANDPIECE INTERPULSE COAX TIP (DISPOSABLE) ×2
HOLDER FOLEY CATH W/STRAP (MISCELLANEOUS) ×2 IMPLANT
IMMOBILIZER KNEE 20 (SOFTGOODS) ×3
IMMOBILIZER KNEE 20 THIGH 36 (SOFTGOODS) ×1 IMPLANT
MANIFOLD NEPTUNE II (INSTRUMENTS) ×3 IMPLANT
NS IRRIG 1000ML POUR BTL (IV SOLUTION) ×1 IMPLANT
PACK TOTAL KNEE CUSTOM (KITS) ×3 IMPLANT
PAD ABD 8X10 STRL (GAUZE/BANDAGES/DRESSINGS) ×2 IMPLANT
PADDING CAST COTTON 6X4 STRL (CAST SUPPLIES) ×7 IMPLANT
PIN STEINMAN FIXATION KNEE (PIN) ×2 IMPLANT
PROTECTOR NERVE ULNAR (MISCELLANEOUS) ×3 IMPLANT
SET HNDPC FAN SPRY TIP SCT (DISPOSABLE) ×1 IMPLANT
STRIP CLOSURE SKIN 1/2X4 (GAUZE/BANDAGES/DRESSINGS) ×4 IMPLANT
SUT MNCRL AB 4-0 PS2 18 (SUTURE) ×3 IMPLANT
SUT STRATAFIX 0 PDS 27 VIOLET (SUTURE) ×3
SUT VIC AB 2-0 CT1 27 (SUTURE) ×6
SUT VIC AB 2-0 CT1 TAPERPNT 27 (SUTURE) ×3 IMPLANT
SUTURE STRATFX 0 PDS 27 VIOLET (SUTURE) ×1 IMPLANT
TIBIAL BASE ROT PLAT SZ 8 KNEE (Knees) ×3 IMPLANT
TRAY FOLEY MTR SLVR 16FR STAT (SET/KITS/TRAYS/PACK) ×3 IMPLANT
WATER STERILE IRR 1000ML POUR (IV SOLUTION) ×6 IMPLANT
WRAP KNEE MAXI GEL POST OP (GAUZE/BANDAGES/DRESSINGS) ×3 IMPLANT
YANKAUER SUCT BULB TIP 10FT TU (MISCELLANEOUS) ×3 IMPLANT

## 2018-06-11 NOTE — Progress Notes (Signed)
Assisted Dr. Rose with left, ultrasound guided, adductor canal block. Side rails up, monitors on throughout procedure. See vital signs in flow sheet. Tolerated Procedure well.  

## 2018-06-11 NOTE — Anesthesia Procedure Notes (Signed)
Anesthesia Procedure Image    

## 2018-06-11 NOTE — Evaluation (Signed)
Physical Therapy Evaluation Patient Details Name: Dakota Ellison MRN: [id number] DOB: 05/14/1951 Today's Date: 06/11/2018   History of Present Illness  68 yo male s/p L TKR on 06/11/18. PMH includes LBP, anxiety, OSA on CPAP, HLD, obesity, OA, R shoulder arthroscopy.   Clinical Impression   Pt presents with L knee pain, decreased L knee ROM, difficulty performing mobility tasks, and antalgic gait. Pt to benefit from acute PT to address deficits. Pt ambulated hallway distance with RW with min guard assist. Pt educated on ankle pumps (20/hour) to perform this afternoon/evening to increase circulation, to pt's tolerance and limited by pain. PT to progress mobility as tolerated, and will continue to follow acutely.        Follow Up Recommendations Follow surgeon's recommendation for DC plan and follow-up therapies;Supervision for mobility/OOB(HHPT)    Equipment Recommendations  Rolling walker with 5" wheels    Recommendations for Other Services       Precautions / Restrictions Precautions Precautions: Fall Required Braces or Orthoses: Knee Immobilizer - Left Knee Immobilizer - Left: On when out of bed or walking;Discontinue once straight leg raise with < 10 degree lag Restrictions Weight Bearing Restrictions: No Other Position/Activity Restrictions: WBAT       Mobility  Bed Mobility Overal bed mobility: Needs Assistance Bed Mobility: Supine to Sit     Supine to sit: Min guard;HOB elevated     General bed mobility comments: increased time and effort, use of bedrails.   Transfers Overall transfer level: Needs assistance Equipment used: Rolling walker (2 wheeled) Transfers: Sit to/from Stand Sit to Stand: Min guard;From elevated surface         General transfer comment: Min guard for safety. Verbal cuing for hand placement.   Ambulation/Gait Ambulation/Gait assistance: Min guard Gait Distance (Feet): 30 Feet Assistive device: Rolling walker (2 wheeled) Gait  Pattern/deviations: Step-to pattern;Decreased stance time - left;Decreased weight shift to left;Antalgic Gait velocity: decr    General Gait Details: Min guard for safety. Verbal cuing for sequencing, placement in RW, and turning. Pt with very little weight in LLE due to pain.   Stairs            Wheelchair Mobility    Modified Rankin (Stroke Patients Only)       Balance Overall balance assessment: Mild deficits observed, not formally tested                                           Pertinent Vitals/Pain Pain Assessment: 0-10 Pain Score: 7  Pain Location: L thigh Pain Descriptors / Indicators: Aching;Sore Pain Intervention(s): Limited activity within patient's tolerance;Repositioned;Monitored during session    Home Living Family/patient expects to be discharged to:: Private residence Living Arrangements: Spouse/significant other;Children(Unsure if the woman staying with him is a significant other or friend) Available Help at Discharge: Family Type of Home: House Home Access: Stairs to enter(Pt states he may get temporary ramp installed) Entrance Stairs-Rails: None Entrance Stairs-Number of Steps: 4 Home Layout: One level Home Equipment: Crutches      Prior Function Level of Independence: Independent               Hand Dominance   Dominant Hand: Right    Extremity/Trunk Assessment   Upper Extremity Assessment Upper Extremity Assessment: Overall WFL for tasks assessed    Lower Extremity Assessment Lower Extremity Assessment: Overall WFL for tasks assessed;LLE deficits/detail LLE Deficits /  Details: suspected post-surgical weakness; able to perform ankle pumps, quad set, heel slides.  LLE Sensation: WNL    Cervical / Trunk Assessment Cervical / Trunk Assessment: Normal  Communication   Communication: No difficulties  Cognition Arousal/Alertness: Awake/alert Behavior During Therapy: WFL for tasks assessed/performed Overall  Cognitive Status: Within Functional Limits for tasks assessed                                        General Comments      Exercises Total Joint Exercises Goniometric ROM: knee aarom ~10-50*, limited by pain    Assessment/Plan    PT Assessment Patient needs continued PT services  PT Problem List Decreased strength;Pain;Decreased range of motion;Decreased activity tolerance;Decreased knowledge of use of DME;Decreased balance;Decreased safety awareness;Decreased mobility       PT Treatment Interventions DME instruction;Therapeutic activities;Gait training;Therapeutic exercise;Patient/family education;Balance training;Stair training;Functional mobility training    PT Goals (Current goals can be found in the Care Plan section)  Acute Rehab PT Goals Patient Stated Goal: go home  PT Goal Formulation: With patient Time For Goal Achievement: 06/18/18 Potential to Achieve Goals: Good    Frequency 7X/week   Barriers to discharge        Co-evaluation               AM-PAC PT "6 Clicks" Mobility  Outcome Measure Help needed turning from your back to your side while in a flat bed without using bedrails?: A Little Help needed moving from lying on your back to sitting on the side of a flat bed without using bedrails?: A Little Help needed moving to and from a bed to a chair (including a wheelchair)?: A Little Help needed standing up from a chair using your arms (e.g., wheelchair or bedside chair)?: A Little Help needed to walk in hospital room?: A Little Help needed climbing 3-5 steps with a railing? : A Little 6 Click Score: 18    End of Session Equipment Utilized During Treatment: Gait belt Activity Tolerance: Patient tolerated treatment well;Patient limited by pain Patient left: in chair;with call bell/phone within reach;with family/visitor present;with nursing/sitter in room(RN states she will plug in pt's SCDs and chair alarm ) Nurse Communication: Mobility  status PT Visit Diagnosis: Other abnormalities of gait and mobility (R26.89);Difficulty in walking, not elsewhere classified (R26.2)    Time: 1825-1900 PT Time Calculation (min) (ACUTE ONLY): 35 min   Charges:   PT Evaluation $PT Eval Low Complexity: 1 Low PT Treatments $Gait Training: 8-22 mins        Nicola Police, PT Acute Rehabilitation Services Pager [phone]  Office [phone]   Dakota Ellison 06/11/2018, 8:12 PM

## 2018-06-11 NOTE — Anesthesia Postprocedure Evaluation (Signed)
Anesthesia Post Note  Patient: Dakota Ellison [facility]  Procedure(s) Performed: LEFT TOTAL KNEE ARTHROPLASTY (Left Knee)     Patient location during evaluation: PACU Anesthesia Type: Spinal Level of consciousness: oriented and awake and alert Pain management: pain level controlled Vital Signs Assessment: post-procedure vital signs reviewed and stable Respiratory status: spontaneous breathing, respiratory function stable and patient connected to nasal cannula oxygen Cardiovascular status: blood pressure returned to baseline and stable Postop Assessment: no headache, no backache and no apparent nausea or vomiting Anesthetic complications: no    Last Vitals:  Vitals:   06/11/18 1400 06/11/18 1415  BP: 103/70 105/66  Pulse: 77 76  Resp: 17 17  Temp:  36.6 C  SpO2: 97% 97%    Last Pain:  Vitals:   06/11/18 1415  TempSrc:   PainSc: 0-No pain                 Guerin Lashomb S

## 2018-06-11 NOTE — Interval H&P Note (Signed)
History and Physical Interval Note:  06/11/2018 10:27 AM  Dakota Ellison  has presented today for surgery, with the diagnosis of left knee osteoarthritis  The various methods of treatment have been discussed with the patient and family. After consideration of risks, benefits and other options for treatment, the patient has consented to  Procedure(s) with comments: LEFT TOTAL KNEE ARTHROPLASTY (Left) - 50min as a surgical intervention .  The patient's history has been reviewed, patient examined, no change in status, stable for surgery.  I have reviewed the patient's chart and labs.  Questions were answered to the patient's satisfaction.     Homero FellersFrank Shahrukh Pasch

## 2018-06-11 NOTE — Transfer of Care (Signed)
Immediate Anesthesia Transfer of Care Note  Patient: Dakota Ellison [facility]  Procedure(s) Performed: LEFT TOTAL KNEE ARTHROPLASTY (Left Knee)  Patient Location: PACU  Anesthesia Type:Spinal  Level of Consciousness: drowsy and patient cooperative  Airway & Oxygen Therapy: Patient Spontanous Breathing and Patient connected to face mask oxygen  Post-op Assessment: Report given to RN and Post -op Vital signs reviewed and stable  Post vital signs: Reviewed and stable  Last Vitals:  Vitals Value Taken Time  BP 100/68 06/11/2018  1:12 PM  Temp    Pulse 78 06/11/2018  1:14 PM  Resp 16 06/11/2018  1:14 PM  SpO2 97 % 06/11/2018  1:14 PM  Vitals shown include unvalidated device data.  Last Pain:  Vitals:   06/11/18 1102  TempSrc:   PainSc: 0-No pain      Patients Stated Pain Goal: 4 (06/11/18 1001)  Complications: No apparent anesthesia complications

## 2018-06-11 NOTE — Op Note (Signed)
OPERATIVE REPORT-TOTAL KNEE ARTHROPLASTY   Pre-operative diagnosis- Osteoarthritis  Left knee(s)  Post-operative diagnosis- Osteoarthritis Left knee(s)  Procedure-  Left  Total Knee Arthroplasty  Surgeon- Gus Rankin. Jezreel Justiniano, MD  Assistant- Dimitri Ped, PA-C   Anesthesia-  Adductor canal block and spinal  EBL- 25 ml   Drains Hemovac  Tourniquet time- 39 minutes @ 300 mm Hg   Complications- None  Condition-PACU - hemodynamically stable.   Brief Clinical Note   Dakota Ellison is a 68 y.o. year old male with end stage OA of his left knee with progressively worsening pain and dysfunction. He has constant pain, with activity and at rest and significant functional deficits with difficulties even with ADLs. He has had extensive non-op management including analgesics, injections of cortisone and viscosupplements, and home exercise program, but remains in significant pain with significant dysfunction. Radiographs show bone on bone arthritis medial and patellofemoral. He presents now for left Total Knee Arthroplasty.    Procedure in detail---   The patient is brought into the operating room and positioned supine on the operating table. After successful administration of  Adductor canal block and spinal,   a tourniquet is placed high on the  Left thigh(s) and the lower extremity is prepped and draped in the usual sterile fashion. Time out is performed by the operating team and then the  Left lower extremity is wrapped in Esmarch, knee flexed and the tourniquet inflated to 300 mmHg.       A midline incision is made with a ten blade through the subcutaneous tissue to the level of the extensor mechanism. A fresh blade is used to make a medial parapatellar arthrotomy. Soft tissue over the proximal medial tibia is subperiosteally elevated to the joint line with a knife and into the semimembranosus bursa with a Cobb elevator. Soft tissue over the proximal lateral tibia is elevated with attention  being paid to avoiding the patellar tendon on the tibial tubercle. The patella is everted, knee flexed 90 degrees and the ACL and PCL are removed. Findings are bone on bone medial and patellofemoral with large global osteophytes.        The drill is used to create a starting hole in the distal femur and the canal is thoroughly irrigated with sterile saline to remove the fatty contents. The 5 degree Left  valgus alignment guide is placed into the femoral canal and the distal femoral cutting block is pinned to remove 9 mm off the distal femur. Resection is made with an oscillating saw.      The tibia is subluxed forward and the menisci are removed. The extramedullary alignment guide is placed referencing proximally at the medial aspect of the tibial tubercle and distally along the second metatarsal axis and tibial crest. The block is pinned to remove 49mm off the more deficient medial  side. Resection is made with an oscillating saw. Size 8is the most appropriate size for the tibia and the proximal tibia is prepared with the modular drill and keel punch for that size.      The femoral sizing guide is placed and size 8 is most appropriate. Rotation is marked off the epicondylar axis and confirmed by creating a rectangular flexion gap at 90 degrees. The size 8 cutting block is pinned in this rotation and the anterior, posterior and chamfer cuts are made with the oscillating saw. The intercondylar block is then placed and that cut is made.      Trial size 8 tibial component, trial  size 8 posterior stabilized femur and a 8  mm posterior stabilized rotating platform insert trial is placed. Full extension is achieved with excellent varus/valgus and anterior/posterior balance throughout full range of motion. The patella is everted and thickness measured to be 27  mm. Free hand resection is taken to 15 mm, a 41 template is placed, lug holes are drilled, trial patella is placed, and it tracks normally. Osteophytes are  removed off the posterior femur with the trial in place. All trials are removed and the cut bone surfaces prepared with pulsatile lavage. Cement is mixed and once ready for implantation, the size 8 tibial implant, size  8 posterior stabilized femoral component, and the size 41 patella are cemented in place and the patella is held with the clamp. The trial insert is placed and the knee held in full extension. The Exparel (20 ml mixed with 60 ml saline) is injected into the extensor mechanism, posterior capsule, medial and lateral gutters and subcutaneous tissues.  All extruded cement is removed and once the cement is hard the permanent 8 mm posterior stabilized rotating platform insert is placed into the tibial tray.      The wound is copiously irrigated with saline solution and the extensor mechanism closed over a hemovac drain with #1 V-loc suture. The tourniquet is released for a total tourniquet time of 39  minutes. Flexion against gravity is 140 degrees and the patella tracks normally. Subcutaneous tissue is closed with 2.0 vicryl and subcuticular with running 4.0 Monocryl. The incision is cleaned and dried and steri-strips and a bulky sterile dressing are applied. The limb is placed into a knee immobilizer and the patient is awakened and transported to recovery in stable condition.      Please note that a surgical assistant was a medical necessity for this procedure in order to perform it in a safe and expeditious manner. Surgical assistant was necessary to retract the ligaments and vital neurovascular structures to prevent injury to them and also necessary for proper positioning of the limb to allow for anatomic placement of the prosthesis.   Gus Rankin Faithanne Verret, MD    06/11/2018, 12:43 PM

## 2018-06-11 NOTE — Anesthesia Preprocedure Evaluation (Signed)
Anesthesia Evaluation  Patient identified by MRN, date of birth, ID band Patient awake    Reviewed: Allergy & Precautions, NPO status , Patient's Chart, lab work & pertinent test results  Airway Mallampati: II  TM Distance: <3 FB Neck ROM: Full    Dental no notable dental hx.    Pulmonary sleep apnea ,    Pulmonary exam normal breath sounds clear to auscultation       Cardiovascular hypertension, Normal cardiovascular exam Rhythm:Regular Rate:Normal     Neuro/Psych negative neurological ROS  negative psych ROS   GI/Hepatic negative GI ROS, Neg liver ROS,   Endo/Other  Morbid obesity  Renal/GU negative Renal ROS  negative genitourinary   Musculoskeletal negative musculoskeletal ROS (+)   Abdominal   Peds negative pediatric ROS (+)  Hematology negative hematology ROS (+)   Anesthesia Other Findings   Reproductive/Obstetrics negative OB ROS                             Anesthesia Physical Anesthesia Plan  ASA: III  Anesthesia Plan: Spinal   Post-op Pain Management:  Regional for Post-op pain   Induction: Intravenous  PONV Risk Score and Plan: 2 and Ondansetron and Dexamethasone  Airway Management Planned: Simple Face Mask  Additional Equipment:   Intra-op Plan:   Post-operative Plan:   Informed Consent: I have reviewed the patients History and Physical, chart, labs and discussed the procedure including the risks, benefits and alternatives for the proposed anesthesia with the patient or authorized representative who has indicated his/her understanding and acceptance.   Dental advisory given  Plan Discussed with: CRNA and Surgeon  Anesthesia Plan Comments:         Anesthesia Quick Evaluation

## 2018-06-11 NOTE — Discharge Instructions (Signed)
° °Dr. Frank Aluisio °Total Joint Specialist °[facility] °[address] °[place], [place] [address] °[phone] ° °TOTAL KNEE REPLACEMENT POSTOPERATIVE DIRECTIONS ° °Knee Rehabilitation, Guidelines Following Surgery  °Results after knee surgery are often greatly improved when you follow the exercise, range of motion and muscle strengthening exercises prescribed by your doctor. Safety measures are also important to protect the knee from further injury. Any time any of these exercises cause you to have increased pain or swelling in your knee joint, decrease the amount until you are comfortable again and slowly increase them. If you have problems or questions, call your caregiver or physical therapist for advice.  ° °HOME CARE INSTRUCTIONS  °• Remove items at home which could result in a fall. This includes throw rugs or furniture in walking pathways.  °· ICE to the affected knee every three hours for 30 minutes at a time and then as needed for pain and swelling.  Continue to use ice on the knee for pain and swelling from surgery. You may notice swelling that will progress down to the foot and ankle.  This is normal after surgery.  Elevate the leg when you are not up walking on it.   °· Continue to use the breathing machine which will help keep your temperature down.  It is common for your temperature to cycle up and down following surgery, especially at night when you are not up moving around and exerting yourself.  The breathing machine keeps your lungs expanded and your temperature down. °· Do not place pillow under knee, focus on keeping the knee straight while resting ° °DIET °You may resume your previous home diet once your are discharged from the hospital. ° °DRESSING / WOUND CARE / SHOWERING °You may shower 3 days after surgery, but keep the wounds dry during showering.  You may use an occlusive plastic wrap (Press'n Seal for example), NO SOAKING/SUBMERGING IN THE BATHTUB.  If the bandage  gets wet, change with a clean dry gauze.  If the incision gets wet, pat the wound dry with a clean towel. °You may start showering once you are discharged home but do not submerge the incision under water. Just pat the incision dry and apply a dry gauze dressing on daily. °Change the surgical dressing daily and reapply a dry dressing each time. ° °ACTIVITY °Walk with your walker as instructed. °Use walker as long as suggested by your caregivers. °Avoid periods of inactivity such as sitting longer than an hour when not asleep. This helps prevent blood clots.  °You may resume a sexual relationship in one month or when given the OK by your doctor.  °You may return to work once you are cleared by your doctor.  °Do not drive a car for 6 weeks or until released by you surgeon.  °Do not drive while taking narcotics. ° °WEIGHT BEARING °Weight bearing as tolerated with assist device (walker, cane, etc) as directed, use it as long as suggested by your surgeon or therapist, typically at least 4-6 weeks. ° °POSTOPERATIVE CONSTIPATION PROTOCOL °Constipation - defined medically as fewer than three stools per week and severe constipation as less than one stool per week. ° °One of the most common issues patients have following surgery is constipation.  Even if you have a regular bowel pattern at home, your normal regimen is likely to be disrupted due to multiple reasons following surgery.  Combination of anesthesia, postoperative narcotics, change in appetite and fluid intake all can affect your bowels.    In order to avoid complications following surgery, here are some recommendations in order to help you during your recovery period. ° °Colace (docusate) - Pick up an over-the-counter form of Colace or another stool softener and take twice a day as long as you are requiring postoperative pain medications.  Take with a full glass of water daily.  If you experience loose stools or diarrhea, hold the colace until you stool forms back  up.  If your symptoms do not get better within 1 week or if they get worse, check with your doctor. ° °Dulcolax (bisacodyl) - Pick up over-the-counter and take as directed by the product packaging as needed to assist with the movement of your bowels.  Take with a full glass of water.  Use this product as needed if not relieved by Colace only.  ° °MiraLax (polyethylene glycol) - Pick up over-the-counter to have on hand.  MiraLax is a solution that will increase the amount of water in your bowels to assist with bowel movements.  Take as directed and can mix with a glass of water, juice, soda, coffee, or tea.  Take if you go more than two days without a movement. °Do not use MiraLax more than once per day. Call your doctor if you are still constipated or irregular after using this medication for 7 days in a row. ° °If you continue to have problems with postoperative constipation, please contact the office for further assistance and recommendations.  If you experience "the worst abdominal pain ever" or develop nausea or vomiting, please contact the office immediatly for further recommendations for treatment. ° °ITCHING ° If you experience itching with your medications, try taking only a single pain pill, or even half a pain pill at a time.  You can also use Benadryl over the counter for itching or also to help with sleep.  ° °TED HOSE STOCKINGS °Wear the elastic stockings on both legs for three weeks following surgery during the day but you may remove then at night for sleeping. ° °MEDICATIONS °See your medication summary on the “After Visit Summary” that the nursing staff will review with you prior to discharge.  You may have some home medications which will be placed on hold until you complete the course of blood thinner medication.  It is important for you to complete the blood thinner medication as prescribed by your surgeon.  Continue your approved medications as instructed at time of discharge. ° °PRECAUTIONS °If  you experience chest pain or shortness of breath - call 911 immediately for transfer to the hospital emergency department.  °If you develop a fever greater that 101 F, purulent drainage from wound, increased redness or drainage from wound, foul odor from the wound/dressing, or calf pain - CONTACT YOUR SURGEON.   °                                                °FOLLOW-UP APPOINTMENTS °Make sure you keep all of your appointments after your operation with your surgeon and caregivers. You should call the office at the above phone number and make an appointment for approximately two weeks after the date of your surgery or on the date instructed by your surgeon outlined in the "After Visit Summary". ° ° °RANGE OF MOTION AND STRENGTHENING EXERCISES  °Rehabilitation of the knee is important following a knee injury or   an operation. After just a few days of immobilization, the muscles of the thigh which control the knee become weakened and shrink (atrophy). Knee exercises are designed to build up the tone and strength of the thigh muscles and to improve knee motion. Often times heat used for twenty to thirty minutes before working out will loosen up your tissues and help with improving the range of motion but do not use heat for the first two weeks following surgery. These exercises can be done on a training (exercise) mat, on the floor, on a table or on a bed. Use what ever works the best and is most comfortable for you Knee exercises include:  °• Leg Lifts - While your knee is still immobilized in a splint or cast, you can do straight leg raises. Lift the leg to 60 degrees, hold for 3 sec, and slowly lower the leg. Repeat 10-20 times 2-3 times daily. Perform this exercise against resistance later as your knee gets better.  °• Quad and Hamstring Sets - Tighten up the muscle on the front of the thigh (Quad) and hold for 5-10 sec. Repeat this 10-20 times hourly. Hamstring sets are done by pushing the foot backward against an  object and holding for 5-10 sec. Repeat as with quad sets.  °· Leg Slides: Lying on your back, slowly slide your foot toward your buttocks, bending your knee up off the floor (only go as far as is comfortable). Then slowly slide your foot back down until your leg is flat on the floor again. °· Angel Wings: Lying on your back spread your legs to the side as far apart as you can without causing discomfort.  °A rehabilitation program following serious knee injuries can speed recovery and prevent re-injury in the future due to weakened muscles. Contact your doctor or a physical therapist for more information on knee rehabilitation.  ° °IF YOU ARE TRANSFERRED TO A SKILLED REHAB FACILITY °If the patient is transferred to a skilled rehab facility following release from the hospital, a list of the current medications will be sent to the facility for the patient to continue.  When discharged from the skilled rehab facility, please have the facility set up the patient's Home Health Physical Therapy prior to being released. Also, the skilled facility will be responsible for providing the patient with their medications at time of release from the facility to include their pain medication, the muscle relaxants, and their blood thinner medication. If the patient is still at the rehab facility at time of the two week follow up appointment, the skilled rehab facility will also need to assist the patient in arranging follow up appointment in our office and any transportation needs. ° °MAKE SURE YOU:  °• Understand these instructions.  °• Get help right away if you are not doing well or get worse.  ° ° °Pick up stool softner and laxative for home use following surgery while on pain medications. °Do not submerge incision under water. °Please use good hand washing techniques while changing dressing each day. °May shower starting three days after surgery. °Please use a clean towel to pat the incision dry following showers. °Continue to  use ice for pain and swelling after surgery. °Do not use any lotions or creams on the incision until instructed by your surgeon. ° °

## 2018-06-11 NOTE — Anesthesia Procedure Notes (Addendum)
Spinal  Patient location during procedure: OR Start time: 06/11/2018 11:28 AM End time: 06/11/2018 11:39 AM Reason for block: at surgeon's request Staffing Anesthesiologist: Rose, George, MD Resident/CRNA: Blanton, Shannon M, CRNA Performed: resident/CRNA and anesthesiologist  Preanesthetic Checklist Completed: patient identified, site marked, surgical consent, pre-op evaluation, timeout performed, IV checked, risks and benefits discussed and monitors and equipment checked Spinal Block Patient position: sitting Prep: DuraPrep Patient monitoring: heart rate, continuous pulse ox and blood pressure Approach: midline Location: L3-4 Injection technique: single-shot Needle Needle type: Pencan  Needle gauge: 22 G Needle length: 9 cm Assessment Sensory level: T6 Additional Notes  Functioning IV was confirmed and monitors were applied. Expiration date of kit checked and confirmed. Sterile prep and drape, including hand hygiene and sterile gloves were used. The patient was positioned and the spine was prepped. The skin was anesthetized with lidocaine.  Free flow of clear CSF was obtained prior to injecting local anesthetic into the CSF X 4 attempt.  CRNA Blanton unable to obtain right paramedian or midline L2-L3 (24G) MD Rose obtained positive flow at L3-L4 midline (22G).  The spinal needle aspirated freely following injection.  The needle was carefully withdrawn. Patient tolerated procedure well, without complications. Loss of motor and sensory on exam post injection.      

## 2018-06-11 NOTE — Anesthesia Procedure Notes (Signed)
Anesthesia Regional Block: Adductor canal block   Pre-Anesthetic Checklist: ,, timeout performed, Correct Patient, Correct Site, Correct Laterality, Correct Procedure, Correct Position, site marked, Risks and benefits discussed,  Surgical consent,  Pre-op evaluation,  At surgeon's request and post-op pain management  Laterality: Left  Prep: chloraprep       Needles:  Injection technique: Single-shot  Needle Type: Echogenic Needle     Needle Length: 9cm      Additional Needles:   Procedures:,,,, ultrasound used (permanent image in chart),,,,  Narrative:  Start time: 06/11/2018 10:42 AM End time: 06/11/2018 10:49 AM Injection made incrementally with aspirations every 5 mL.  Events: blood aspirated,,,,,,,,,,  Performed by: Personally  Anesthesiologist: Eilene Ghazi, MD  Additional Notes: Patient tolerated the procedure well without complications

## 2018-06-12 ENCOUNTER — Encounter (HOSPITAL_COMMUNITY): Payer: Self-pay | Admitting: Orthopedic Surgery

## 2018-06-12 LAB — CBC
HCT: 43.6 % (ref 39.0–52.0)
Hemoglobin: 14.2 g/dL (ref 13.0–17.0)
MCH: 28.7 pg (ref 26.0–34.0)
MCHC: 32.6 g/dL (ref 30.0–36.0)
MCV: 88.1 fL (ref 80.0–100.0)
PLATELETS: 250 10*3/uL (ref 150–400)
RBC: 4.95 MIL/uL (ref 4.22–5.81)
RDW: 13.8 % (ref 11.5–15.5)
WBC: 17.5 10*3/uL — ABNORMAL HIGH (ref 4.0–10.5)
nRBC: 0 % (ref 0.0–0.2)

## 2018-06-12 LAB — BASIC METABOLIC PANEL
Anion gap: 10 (ref 5–15)
BUN: 20 mg/dL (ref 8–23)
CO2: 23 mmol/L (ref 22–32)
CREATININE: 1.12 mg/dL (ref 0.61–1.24)
Calcium: 8.7 mg/dL — ABNORMAL LOW (ref 8.9–10.3)
Chloride: 104 mmol/L (ref 98–111)
GFR calc Af Amer: >60 mL/min (ref >60)
GFR calc non Af Amer: >60 mL/min (ref >60)
Glucose, Bld: 128 mg/dL — ABNORMAL HIGH (ref 70–99)
Potassium: 4 mmol/L (ref 3.5–5.1)
Sodium: 137 mmol/L (ref 135–145)

## 2018-06-12 MED ORDER — OXYCODONE HCL 5 MG PO TABS: 5.0000 mg | ORAL_TABLET | Freq: Four times a day (QID) | ORAL | 0 refills | Status: DC | PRN

## 2018-06-12 MED ORDER — TRAMADOL HCL 50 MG PO TABS: 50.0000 mg | ORAL_TABLET | Freq: Four times a day (QID) | ORAL | 0 refills | Status: DC | PRN

## 2018-06-12 MED ORDER — METHOCARBAMOL 500 MG PO TABS: 500.0000 mg | ORAL_TABLET | Freq: Four times a day (QID) | ORAL | 0 refills | Status: DC | PRN

## 2018-06-12 MED ORDER — ASPIRIN 325 MG PO TBEC: 325.0000 mg | DELAYED_RELEASE_TABLET | Freq: Two times a day (BID) | ORAL | 0 refills | Status: AC

## 2018-06-12 MED ORDER — GABAPENTIN 300 MG PO CAPS: 300.0000 mg | ORAL_CAPSULE | Freq: Three times a day (TID) | ORAL | 0 refills | Status: DC

## 2018-06-12 NOTE — Progress Notes (Signed)
Physical Therapy Treatment Patient Details Name: Dakota Ellison MRN: [id number] DOB: [birth year] Today's Date: 06/12/2018    History of Present Illness 68 yo male s/p L TKR on 06/11/18. PMH includes LBP, anxiety, OSA on CPAP, HLD, obesity, OA, R shoulder arthroscopy.     PT Comments    Pt demonstrated proficiency with stair navigation this session, handout administered. Pt with improved ambulation distance, but still limited by L knee pain. RN notified. Pt performed all LE exercises well, handout administered to pt and reviewed. Pt appropriate to d/c with significant other today.     Follow Up Recommendations  Follow surgeon's recommendation for DC plan and follow-up therapies;Supervision for mobility/OOB(HHPT)     Equipment Recommendations  Rolling walker with 5" wheels    Recommendations for Other Services       Precautions / Restrictions Precautions Precautions: Fall Required Braces or Orthoses: (d/c KI due to ability to perform SLR without quad lag) Restrictions Weight Bearing Restrictions: No Other Position/Activity Restrictions: WBAT     Mobility  Bed Mobility Overal bed mobility: Modified Independent Bed Mobility: Supine to Sit     Supine to sit: HOB elevated;Supervision     General bed mobility comments: Pt up in chair upon PT arrival to room.   Transfers Overall transfer level: Needs assistance Equipment used: Rolling walker (2 wheeled) Transfers: Sit to/from Stand Sit to Stand: Supervision         General transfer comment: Supervision for safety. No VC needed.   Ambulation/Gait Ambulation/Gait assistance: Supervision Gait Distance (Feet): 150 Feet Assistive device: Rolling walker (2 wheeled) Gait Pattern/deviations: Decreased stance time - left;Decreased weight shift to left;Antalgic;Step-through pattern;Decreased stride length Gait velocity: decr    General Gait Details: Supervision for safety. Pt with antalgic gait, limiting his step length  bilaterally. No VC required.    Stairs Stairs: Yes Stairs assistance: Min guard Stair Management: No rails;Backwards;Forwards;With walker Number of Stairs: 6(3x2 steps ) General stair comments: Min guard for safety. Verbal cuing for placement of RW, sequencing, and training caregiver to assist with stabilizing RW.    Wheelchair Mobility    Modified Rankin (Stroke Patients Only)       Balance Overall balance assessment: Mild deficits observed, not formally tested                                          Cognition Arousal/Alertness: Awake/alert Behavior During Therapy: WFL for tasks assessed/performed Overall Cognitive Status: Within Functional Limits for tasks assessed                                        Exercises Total Joint Exercises Ankle Circles/Pumps: AROM;Both;5 reps;Supine Quad Sets: AROM;10 reps;Supine;Both(both performed to facilitate recruitment of L quadriceps) Towel Squeeze: AROM;Both;10 reps;Seated Short Arc Quad: AAROM;Left;10 reps;Seated Heel Slides: AAROM;Left;5 reps;Seated Hip ABduction/ADduction: AAROM;Left;10 reps;Supine Straight Leg Raises: AROM;Left;5 reps;Supine Goniometric ROM: L knee aarom ~5-60*, limited by pain and stiffness    General Comments General comments (skin integrity, edema, etc.): Pt's hemovac pulled this am, and pt reports needing a wound dressing as well as bedding change due to bleeding. After ambulation, PT noticed bleeding down lateral L leg, with blood collecting on ted hose. PT notified RN of bleeding, and RN came in at end of session to redress incision area.  Pertinent Vitals/Pain Pain Assessment: 0-10 Pain Score: 2  Pain Location: L posterior knee (9 with mobility per pt report)  Pain Descriptors / Indicators: Sore Pain Intervention(s): Limited activity within patient's tolerance;Repositioned;Monitored during session;Ice applied;Premedicated before session    Home Living                       Prior Function            PT Goals (current goals can now be found in the care plan section) Acute Rehab PT Goals Patient Stated Goal: go home  PT Goal Formulation: With patient Time For Goal Achievement: 06/18/18 Potential to Achieve Goals: Good Progress towards PT goals: Progressing toward goals    Frequency    7X/week      PT Plan Current plan remains appropriate    Co-evaluation              AM-PAC PT "6 Clicks" Mobility   Outcome Measure  Help needed turning from your back to your side while in a flat bed without using bedrails?: A Little Help needed moving from lying on your back to sitting on the side of a flat bed without using bedrails?: A Little Help needed moving to and from a bed to a chair (including a wheelchair)?: A Little Help needed standing up from a chair using your arms (e.g., wheelchair or bedside chair)?: A Little Help needed to walk in hospital room?: A Little Help needed climbing 3-5 steps with a railing? : A Little 6 Click Score: 18    End of Session Equipment Utilized During Treatment: Gait belt Activity Tolerance: Patient tolerated treatment well;Patient limited by pain Patient left: in chair;with call bell/phone within reach;with family/visitor present( ) Nurse Communication: Mobility status PT Visit Diagnosis: Other abnormalities of gait and mobility (R26.89);Difficulty in walking, not elsewhere classified (R26.2)     Time: [id number] PT Time Calculation (min) (ACUTE ONLY): 38 min  Charges:   $Therapeutic Exercise: 8-22 mins $Therapeutic Activity: 8-22 mins                     Gerlene Glassburn Dakota Ellison Dakota Ellison, PT Acute Rehabilitation Services Pager [phone]  Office [phone]   Dwana Garin D Remmie Bembenek 06/12/2018, 4:05 PM

## 2018-06-12 NOTE — Care Management Note (Addendum)
Case Management Note  Patient Details  Name: OTAVIO STRUSS MRN: [id number] Date of Birth: 02/08/1951  Subjective/Objective:       Spoke with patient at bedside. Confirmed plan for OP PT, already arranged. Needs a RW, contacted [facility] to deliver to the room. [phone]             Action/Plan:   Expected Discharge Date:  06/12/18               Expected Discharge Plan:  OP Rehab  In-House Referral:  NA  Discharge planning Services  CM Consult  Post Acute Care Choice:  NA Choice offered to:  Patient  DME Arranged:  N/A DME Agency:  NA  HH Arranged:  NA HH Agency:  NA  Status of Service:  Completed, signed off  If discussed at Long Length of Stay Meetings, dates discussed:    Additional Comments:  Alexis Goodell, RN 06/12/2018, 11:49 AM

## 2018-06-12 NOTE — Progress Notes (Signed)
Report received from Courtney RN

## 2018-06-12 NOTE — Progress Notes (Signed)
Subjective: 1 Day Post-Op Procedure(s) (LRB): LEFT TOTAL KNEE ARTHROPLASTY (Left) Patient reports pain as moderate.   Patient seen in rounds by Dr. Lequita Halt. Patient is well, and has had no acute complaints or problems other than pain in the left knee. No issues overnight. Foley catheter to be removed this AM. Denies chest pain, SOB, or calf pain. We will continue therapy today.   Objective: Vital signs in last 24 hours: Temp:  [97.4 F (36.3 C)-98.4 F (36.9 C)] 98.4 F (36.9 C) (01/07 0631) Pulse Rate:  [72-92] 84 (01/07 0631) Resp:  [13-22] 16 (01/07 0631) BP: (100-139)/(56-97) 112/69 (01/07 0631) SpO2:  [94 %-99 %] 96 % (01/07 0631) Weight:  [124.7 kg] 124.7 kg (01/06 1001)  Intake/Output from previous day:  Intake/Output Summary (Last 24 hours) at 06/12/2018 0233 Last data filed at 06/12/2018 0600 Gross per 24 hour  Intake 3030 ml  Output 1682 ml  Net 1348 ml    Labs: Recent Labs    06/12/18 0413  HGB 14.2   Recent Labs    06/12/18 0413  WBC 17.5*  RBC 4.95  HCT 43.6  PLT 250   Recent Labs    06/12/18 0413  NA 137  K 4.0  CL 104  CO2 23  BUN 20  CREATININE 1.12  GLUCOSE 128*  CALCIUM 8.7*   Exam: General - Patient is Alert and Oriented Extremity - Neurologically intact Neurovascular intact Sensation intact distally Dorsiflexion/Plantar flexion intact Dressing - dressing C/D/I Motor Function - intact, moving foot and toes well on exam.   Past Medical History:  Diagnosis Date  . Anxiety state 04/08/2014   denies  . Complication of anesthesia    and trouble waking up  . Degenerative disc disease   . Dysthymia    denies  . ED (erectile dysfunction)   . GERD (gastroesophageal reflux disease)   . History of MRSA infection    on lip from cut  . Hyperlipemia   . Hypertension    take BP med for small leak in heart to keep it small per pt.  . Mild asthma    as a teen  . Morbid obesity (HCC)   . OSA (obstructive sleep apnea)   .  Osteoarthritis   . PONV (postoperative nausea and vomiting)   . Rectal fissure   . Testosterone deficiency     Assessment/Plan: 1 Day Post-Op Procedure(s) (LRB): LEFT TOTAL KNEE ARTHROPLASTY (Left) Principal Problem:   OA (osteoarthritis) of knee  Estimated body mass index is 37.3 kg/m as calculated from the following:   Height as of this encounter: 6' (1.829 m).   Weight as of this encounter: 124.7 kg. Advance diet Up with therapy D/C IV fluids  Anticipated LOS equal to or greater than 2 midnights due to - Age 27 and older with one or more of the following:  - Obesity  - Expected need for hospital services (PT, OT, Nursing) required for safe  discharge  - Anticipated need for postoperative skilled nursing care or inpatient rehab  - Active co-morbidities: None OR   - Unanticipated findings during/Post Surgery: None  - Patient is a high risk of re-admission due to: None    DVT Prophylaxis - Aspirin Weight bearing as tolerated. D/C O2 and pulse ox and try on room air. Hemovac pulled without difficulty, will continue therapy today.  Plan is to go Home after hospital stay. Possible discharge this afternoon if progresses with physical therapy and meeting his goals. Scheduled for outpatient PT  at [facility] in [place]. Follow-up in the office in 2 weeks.  Arther Abbott, PA-C Orthopedic Surgery 06/12/2018, 7:12 AM

## 2018-06-12 NOTE — Progress Notes (Signed)
Physical Therapy Treatment Patient Details Name: Dakota Ellison MRN: [id number] DOB: 1950-09-07 Today's Date: 06/12/2018    History of Present Illness 68 yo male s/p L TKR on 06/11/18. PMH includes LBP, anxiety, OSA on CPAP, HLD, obesity, OA, R shoulder arthroscopy.     PT Comments    Pt with increased L posterior knee pain this session, pt reports 9/10 at start of session. Pt with improved ambulation distance today, but continues to present with very short step length and worsening antalgic gait with further distance ambulated. Pt performed LE well and required min verbal and tactile cuing. PT to see pt for second session this afternoon to progress mobility and practice stair navigation. PT to continue to follow acutely.    Follow Up Recommendations  Follow surgeon's recommendation for DC plan and follow-up therapies;Supervision for mobility/OOB(HHPT)     Equipment Recommendations  Rolling walker with 5" wheels    Recommendations for Other Services       Precautions / Restrictions Precautions Precautions: Fall Required Braces or Orthoses: (d/c KI due to ability to perform SLR without quad lag) Restrictions Weight Bearing Restrictions: No Other Position/Activity Restrictions: WBAT     Mobility  Bed Mobility Overal bed mobility: Needs Assistance Bed Mobility: Supine to Sit     Supine to sit: HOB elevated;Supervision     General bed mobility comments: Supervision for safety. Increased time and effort, limited by pain.   Transfers Overall transfer level: Needs assistance Equipment used: Rolling walker (2 wheeled) Transfers: Sit to/from Stand Sit to Stand: Min guard;From elevated surface         General transfer comment: Min guard for safety. Pt with proper hand placement. Bed adjusted to be approximately his bed height at home.   Ambulation/Gait Ambulation/Gait assistance: Min guard;Supervision Gait Distance (Feet): 100 Feet Assistive device: Rolling walker (2  wheeled) Gait Pattern/deviations: Step-to pattern;Decreased stance time - left;Decreased weight shift to left;Antalgic Gait velocity: decr    General Gait Details: Min guard to supervision for safety. Pt with increaasingly antalgic gait with further ambulation, presents with decreased stride length due to pain. Pt reminded to breathe during ambulation, pt with tendency to hold his breath.    Stairs             Wheelchair Mobility    Modified Rankin (Stroke Patients Only)       Balance Overall balance assessment: Mild deficits observed, not formally tested                                          Cognition Arousal/Alertness: Awake/alert Behavior During Therapy: WFL for tasks assessed/performed Overall Cognitive Status: Within Functional Limits for tasks assessed                                        Exercises Total Joint Exercises Ankle Circles/Pumps: AROM;Both;5 reps;Supine Quad Sets: AROM;10 reps;Supine;Both(both performed to facilitate recruitment of L quadriceps) Heel Slides: AAROM;Left;15 reps;Supine Hip ABduction/ADduction: AAROM;Left;10 reps;Supine Straight Leg Raises: AROM;Left;5 reps;Supine Goniometric ROM: L knee aarom ~5-60*, limited by pain and stiffness    General Comments General comments (skin integrity, edema, etc.): Pt's hemovac pulled this am, and pt reports needing a wound dressing as well as bedding change due to bleeding. After ambulation, PT noticed bleeding down lateral L leg, with blood collecting  on ted hose. PT notified RN of bleeding, and RN came in at end of session to redress incision area.       Pertinent Vitals/Pain Pain Assessment: 0-10 Pain Score: 9  Pain Location: L posterior knee  Pain Descriptors / Indicators: Aching;Sore Pain Intervention(s): Limited activity within patient's tolerance;Repositioned;Monitored during session    Home Living                      Prior Function             PT Goals (current goals can now be found in the care plan section) Acute Rehab PT Goals Patient Stated Goal: go home  PT Goal Formulation: With patient Time For Goal Achievement: 06/18/18 Potential to Achieve Goals: Good Progress towards PT goals: Progressing toward goals    Frequency    7X/week      PT Plan Current plan remains appropriate    Co-evaluation              AM-PAC PT "6 Clicks" Mobility   Outcome Measure  Help needed turning from your back to your side while in a flat bed without using bedrails?: A Little Help needed moving from lying on your back to sitting on the side of a flat bed without using bedrails?: A Little Help needed moving to and from a bed to a chair (including a wheelchair)?: A Little Help needed standing up from a chair using your arms (e.g., wheelchair or bedside chair)?: A Little Help needed to walk in hospital room?: A Little Help needed climbing 3-5 steps with a railing? : A Little 6 Click Score: 18    End of Session Equipment Utilized During Treatment: Gait belt Activity Tolerance: Patient tolerated treatment well;Patient limited by pain Patient left: in chair;with call bell/phone within reach;with family/visitor present;with nursing/sitter in room( ) Nurse Communication: Mobility status;Other (comment)(Pt bleeding from wound dressing) PT Visit Diagnosis: Other abnormalities of gait and mobility (R26.89);Difficulty in walking, not elsewhere classified (R26.2)     Time: [id number] PT Time Calculation (min) (ACUTE ONLY): 20 min  Charges:  $Gait Training: 8-22 mins                    Nicola PoliceAlexa D Barbette Mcglaun, PT Acute Rehabilitation Services Pager [phone]  Office [phone]  Tyrone AppleAlexa D Despina Hiddenure 06/12/2018, 12:42 PM

## 2018-06-12 NOTE — Care Management Note (Signed)
Case Management Note  Patient Details  Name: Dakota Ellison MRN: [id number] Date of Birth: Oct 13, 1950  Subjective/Objective:                    Action/Plan: Added DME per request, contacted [facility] to deliver to the room  Expected Discharge Date:  06/12/18               Expected Discharge Plan:  OP Rehab  In-House Referral:  NA  Discharge planning Services  CM Consult  Post Acute Care Choice:  NA Choice offered to:  Patient  DME Arranged:  3-N-1, Walker rolling DME Agency:  [facility]  HH Arranged:  NA HH Agency:  NA  Status of Service:  Completed, signed off  If discussed at Long Length of Stay Meetings, dates discussed:    Additional Comments:  Alexis Goodell, RN 06/12/2018, 12:47 PM

## 2018-06-13 NOTE — Discharge Summary (Signed)
Physician Discharge Summary   Patient ID: HAVOC SANLUIS MRN: [id number] DOB/AGE: September 20, 1950 68 y.o.  Admit date: 06/11/2018 Discharge date: 06/12/2018  Primary Diagnosis: Osteoarthritis, left knee   Admission Diagnoses:  Past Medical History:  Diagnosis Date  . Anxiety state 04/08/2014   denies  . Complication of anesthesia    and trouble waking up  . Degenerative disc disease   . Dysthymia    denies  . ED (erectile dysfunction)   . GERD (gastroesophageal reflux disease)   . History of MRSA infection    on lip from cut  . Hyperlipemia   . Hypertension    take BP med for small leak in heart to keep it small per pt.  . Mild asthma    as a teen  . Morbid obesity (HCC)   . OSA (obstructive sleep apnea)   . Osteoarthritis   . PONV (postoperative nausea and vomiting)   . Rectal fissure   . Testosterone deficiency    Discharge Diagnoses:   Principal Problem:   OA (osteoarthritis) of knee  Estimated body mass index is 37.3 kg/m as calculated from the following:   Height as of this encounter: 6' (1.829 m).   Weight as of this encounter: 124.7 kg.  Procedure:  Procedure(s) (LRB): LEFT TOTAL KNEE ARTHROPLASTY (Left)   Consults: None  HPI: Dakota Ellison is a 68 y.o. year old male with end stage OA of his left knee with progressively worsening pain and dysfunction. He has constant pain, with activity and at rest and significant functional deficits with difficulties even with ADLs. He has had extensive non-op management including analgesics, injections of cortisone and viscosupplements, and home exercise program, but remains in significant pain with significant dysfunction. Radiographs show bone on bone arthritis medial and patellofemoral. He presents now for left Total Knee Arthroplasty.  Laboratory Data: Admission on 06/11/2018, Discharged on 06/12/2018  Component Date Value Ref Range Status  . ABO/RH(D) 06/08/2018    Final                   Value:A NEG Performed at [facility], [address], [place], [place] [address]   . WBC 06/12/2018 17.5* 4.0 - 10.5 K/uL Final  . RBC 06/12/2018 4.95  4.22 - 5.81 MIL/uL Final  . Hemoglobin 06/12/2018 14.2  13.0 - 17.0 g/dL Final  . HCT 78/46/9629 43.6  39.0 - 52.0 % Final  . MCV 06/12/2018 88.1  80.0 - 100.0 fL Final  . MCH 06/12/2018 28.7  26.0 - 34.0 pg Final  . MCHC 06/12/2018 32.6  30.0 - 36.0 g/dL Final  . RDW 52/84/1324 13.8  11.5 - 15.5 % Final  . Platelets 06/12/2018 250  150 - 400 K/uL Final  . nRBC 06/12/2018 0.0  0.0 - 0.2 % Final   Performed at [facility], [address], [place], [place] [address]  . Sodium 06/12/2018 137  135 - 145 mmol/L Final  . Potassium 06/12/2018 4.0  3.5 - 5.1 mmol/L Final  . Chloride 06/12/2018 104  98 - 111 mmol/L Final  . CO2 06/12/2018 23  22 - 32 mmol/L Final  . Glucose, Bld 06/12/2018 128* 70 - 99 mg/dL Final  . BUN 72/53/6644 20  8 - 23 mg/dL Final  . Creatinine, Ser 06/12/2018 1.12  0.61 - 1.24 mg/dL Final  . Calcium 03/47/4259 8.7* 8.9 - 10.3 mg/dL Final  . GFR calc non Af Amer 06/12/2018 >60  >60 mL/min Final  .  GFR calc Af Amer 06/12/2018 >60  >60 mL/min Final  . Anion gap 06/12/2018 10  5 - 15 Final   Performed at [facility], [address] [place], [place] [address]  Hospital Outpatient Visit on 06/08/2018  Component Date Value Ref Range Status  . aPTT 06/08/2018 29  24 - 36 seconds Final   Performed at [facility], [address] [place], [place] [address]  . WBC 06/08/2018 11.2* 4.0 - 10.5 K/uL Final  . RBC 06/08/2018 5.82* 4.22 - 5.81 MIL/uL Final  . Hemoglobin 06/08/2018 16.5  13.0 - 17.0 g/dL Final  . HCT 87/68/1157 49.7  39.0 - 52.0 % Final  . MCV 06/08/2018 85.4  80.0 - 100.0 fL Final  . MCH 06/08/2018 28.4  26.0 - 34.0 pg Final  . MCHC 06/08/2018 33.2  30.0 - 36.0 g/dL Final  . RDW 26/20/3559 13.6  11.5 - 15.5 % Final  . Platelets 06/08/2018 261  150 - 400 K/uL Final    . nRBC 06/08/2018 0.0  0.0 - 0.2 % Final   Performed at [facility], [address], [place], [place] [address]  . Sodium 06/08/2018 138  135 - 145 mmol/L Final  . Potassium 06/08/2018 4.3  3.5 - 5.1 mmol/L Final  . Chloride 06/08/2018 102  98 - 111 mmol/L Final  . CO2 06/08/2018 24  22 - 32 mmol/L Final  . Glucose, Bld 06/08/2018 89  70 - 99 mg/dL Final  . BUN 84/53/6468 16  8 - 23 mg/dL Final  . Creatinine, Ser 06/08/2018 1.09  0.61 - 1.24 mg/dL Final  . Calcium 08/25/2246 9.3  8.9 - 10.3 mg/dL Final  . Total Protein 06/08/2018 7.3  6.5 - 8.1 g/dL Final  . Albumin 25/00/3704 4.2  3.5 - 5.0 g/dL Final  . AST 88/89/1694 34  15 - 41 U/L Final  . ALT 06/08/2018 29  0 - 44 U/L Final  . Alkaline Phosphatase 06/08/2018 73  38 - 126 U/L Final  . Total Bilirubin 06/08/2018 0.7  0.3 - 1.2 mg/dL Final  . GFR calc non Af Amer 06/08/2018 >60  >60 mL/min Final  . GFR calc Af Amer 06/08/2018 >60  >60 mL/min Final  . Anion gap 06/08/2018 12  5 - 15 Final   Performed at [facility], [address] [place], [place] [address]  . Prothrombin Time 06/08/2018 11.6  11.4 - 15.2 seconds Final  . INR 06/08/2018 0.86   Final   Performed at [facility], [address], [place], [place] [address]  . ABO/RH(D) 06/08/2018 A NEG   Final  . Antibody Screen 06/08/2018 NEG   Final  . Sample Expiration 06/08/2018 06/14/2018   Final  . Extend sample reason 06/08/2018    Final                   Value:NO TRANSFUSIONS OR PREGNANCY IN THE PAST 3 MONTHS Performed at [facility], [address] [place], [place] [address]   . MRSA, PCR 06/08/2018 NEGATIVE  NEGATIVE Final  . Staphylococcus aureus 06/08/2018 NEGATIVE  NEGATIVE Final   Comment: (NOTE) The Xpert SA Assay (FDA approved for NASAL specimens in patients 65 years of age and older), is one component of a comprehensive surveillance program. It is not intended to diagnose infection  nor to guide or monitor treatment. Performed at [facility], [address], [place], [place] [address]  X-Rays:No results found.  EKG: Orders placed or performed in visit on 04/02/18  . EKG 12-Lead     Hospital Course: Dakota Ellison is a 68 y.o. who was admitted to [facility]. They were brought to the operating room on 06/11/2018 and underwent Procedure(s): LEFT TOTAL KNEE ARTHROPLASTY.  Patient tolerated the procedure well and was later transferred to the recovery room and then to the orthopaedic floor for postoperative care. They were given PO and IV analgesics for pain control following their surgery. They were given 24 hours of postoperative antibiotics of  Anti-infectives (From admission, onward)   Start     Dose/Rate Route Frequency Ordered Stop   06/11/18 1800  ceFAZolin (ANCEF) IVPB 2g/100 mL premix     2 g 200 mL/hr over 30 Minutes Intravenous Every 6 hours 06/11/18 1632 06/11/18 2344   06/11/18 0600  ceFAZolin (ANCEF) 3 g in dextrose 5 % 50 mL IVPB     3 g 100 mL/hr over 30 Minutes Intravenous On call to O.R. 06/10/18 7544 06/11/18 1130     and started on DVT prophylaxis in the form of Aspirin.   PT and OT were ordered for total joint protocol. Discharge planning consulted to help with postop disposition and equipment needs.  Patient had a good night on the evening of surgery. They started to get up OOB with therapy on POD #0. Pt was seen during rounds and was ready to go home pending progress with therapy. Hemovac drain was pulled without difficulty. He worked with therapy on POD #1 and was meeting his goals. Pt was discharged to home later that day in stable condition.  Diet: Regular diet Activity: WBAT Follow-up: in 2 weeks with Dr. Lequita Halt Disposition: Home with outpatient physical therapy at [facility] in [place] Discharged Condition: stable   Discharge Instructions    Call MD / Call 911   Complete by:  As directed    If you  experience chest pain or shortness of breath, CALL 911 and be transported to the hospital emergency room.  If you develope a fever above 101 F, pus (white drainage) or increased drainage or redness at the wound, or calf pain, call your surgeon's office.   Change dressing   Complete by:  As directed    Change dressing on Wednesday, then change the dressing daily with sterile 4 x 4 inch gauze dressing and apply TED hose.   Constipation Prevention   Complete by:  As directed    Drink plenty of fluids.  Prune juice may be helpful.  You may use a stool softener, such as Colace (over the counter) 100 mg twice a day.  Use MiraLax (over the counter) for constipation as needed.   Diet - low sodium heart healthy   Complete by:  As directed    Discharge instructions   Complete by:  As directed    Dr. [name] Total Joint Specialist [facility] [address] [place], [place] [address] [phone]  TOTAL KNEE REPLACEMENT POSTOPERATIVE DIRECTIONS  Knee Rehabilitation, Guidelines Following Surgery  Results after knee surgery are often greatly improved when you follow the exercise, range of motion and muscle strengthening exercises prescribed by your doctor. Safety measures are also important to protect the knee from further injury. Any time any of these exercises cause you to have increased pain or swelling in your knee joint, decrease the amount until you are comfortable again and slowly increase them. If you have problems or questions, call your  caregiver or physical therapist for advice.   HOME CARE INSTRUCTIONS  Remove items at home which could result in a fall. This includes throw rugs or furniture in walking pathways.  ICE to the affected knee every three hours for 30 minutes at a time and then as needed for pain and swelling.  Continue to use ice on the knee for pain and swelling from surgery. You may notice swelling that will progress down to the foot and ankle.  This is normal  after surgery.  Elevate the leg when you are not up walking on it.   Continue to use the breathing machine which will help keep your temperature down.  It is common for your temperature to cycle up and down following surgery, especially at night when you are not up moving around and exerting yourself.  The breathing machine keeps your lungs expanded and your temperature down. Do not place pillow under knee, focus on keeping the knee straight while resting   DIET You may resume your previous home diet once your are discharged from the hospital.  DRESSING / WOUND CARE / SHOWERING You may shower 3 days after surgery, but keep the wounds dry during showering.  You may use an occlusive plastic wrap (Press'n Seal for example), NO SOAKING/SUBMERGING IN THE BATHTUB.  If the bandage gets wet, change with a clean dry gauze.  If the incision gets wet, pat the wound dry with a clean towel. You may start showering once you are discharged home but do not submerge the incision under water. Just pat the incision dry and apply a dry gauze dressing on daily. Change the surgical dressing daily and reapply a dry dressing each time.  ACTIVITY Walk with your walker as instructed. Use walker as long as suggested by your caregivers. Avoid periods of inactivity such as sitting longer than an hour when not asleep. This helps prevent blood clots.  You may resume a sexual relationship in one month or when given the OK by your doctor.  You may return to work once you are cleared by your doctor.  Do not drive a car for 6 weeks or until released by you surgeon.  Do not drive while taking narcotics.  WEIGHT BEARING Weight bearing as tolerated with assist device (walker, cane, etc) as directed, use it as long as suggested by your surgeon or therapist, typically at least 4-6 weeks.  POSTOPERATIVE CONSTIPATION PROTOCOL Constipation - defined medically as fewer than three stools per week and severe constipation as less than  one stool per week.  One of the most common issues patients have following surgery is constipation.  Even if you have a regular bowel pattern at home, your normal regimen is likely to be disrupted due to multiple reasons following surgery.  Combination of anesthesia, postoperative narcotics, change in appetite and fluid intake all can affect your bowels.  In order to avoid complications following surgery, here are some recommendations in order to help you during your recovery period.  Colace (docusate) - Pick up an over-the-counter form of Colace or another stool softener and take twice a day as long as you are requiring postoperative pain medications.  Take with a full glass of water daily.  If you experience loose stools or diarrhea, hold the colace until you stool forms back up.  If your symptoms do not get better within 1 week or if they get worse, check with your doctor.  Dulcolax (bisacodyl) - Pick up over-the-counter and take as directed by  the product packaging as needed to assist with the movement of your bowels.  Take with a full glass of water.  Use this product as needed if not relieved by Colace only.   MiraLax (polyethylene glycol) - Pick up over-the-counter to have on hand.  MiraLax is a solution that will increase the amount of water in your bowels to assist with bowel movements.  Take as directed and can mix with a glass of water, juice, soda, coffee, or tea.  Take if you go more than two days without a movement. Do not use MiraLax more than once per day. Call your doctor if you are still constipated or irregular after using this medication for 7 days in a row.  If you continue to have problems with postoperative constipation, please contact the office for further assistance and recommendations.  If you experience "the worst abdominal pain ever" or develop nausea or vomiting, please contact the office immediatly for further recommendations for treatment.  ITCHING  If you experience  itching with your medications, try taking only a single pain pill, or even half a pain pill at a time.  You can also use Benadryl over the counter for itching or also to help with sleep.   TED HOSE STOCKINGS Wear the elastic stockings on both legs for three weeks following surgery during the day but you may remove then at night for sleeping.  MEDICATIONS See your medication summary on the "After Visit Summary" that the nursing staff will review with you prior to discharge.  You may have some home medications which will be placed on hold until you complete the course of blood thinner medication.  It is important for you to complete the blood thinner medication as prescribed by your surgeon.  Continue your approved medications as instructed at time of discharge.  PRECAUTIONS If you experience chest pain or shortness of breath - call 911 immediately for transfer to the hospital emergency department.  If you develop a fever greater that 101 F, purulent drainage from wound, increased redness or drainage from wound, foul odor from the wound/dressing, or calf pain - CONTACT YOUR SURGEON.                                                   FOLLOW-UP APPOINTMENTS Make sure you keep all of your appointments after your operation with your surgeon and caregivers. You should call the office at the above phone number and make an appointment for approximately two weeks after the date of your surgery or on the date instructed by your surgeon outlined in the "After Visit Summary".   RANGE OF MOTION AND STRENGTHENING EXERCISES  Rehabilitation of the knee is important following a knee injury or an operation. After just a few days of immobilization, the muscles of the thigh which control the knee become weakened and shrink (atrophy). Knee exercises are designed to build up the tone and strength of the thigh muscles and to improve knee motion. Often times heat used for twenty to thirty minutes before working out will  loosen up your tissues and help with improving the range of motion but do not use heat for the first two weeks following surgery. These exercises can be done on a training (exercise) mat, on the floor, on a table or on a bed. Use what ever works the best and  is most comfortable for you Knee exercises include:  Leg Lifts - While your knee is still immobilized in a splint or cast, you can do straight leg raises. Lift the leg to 60 degrees, hold for 3 sec, and slowly lower the leg. Repeat 10-20 times 2-3 times daily. Perform this exercise against resistance later as your knee gets better.  Quad and Hamstring Sets - Tighten up the muscle on the front of the thigh (Quad) and hold for 5-10 sec. Repeat this 10-20 times hourly. Hamstring sets are done by pushing the foot backward against an object and holding for 5-10 sec. Repeat as with quad sets.  Leg Slides: Lying on your back, slowly slide your foot toward your buttocks, bending your knee up off the floor (only go as far as is comfortable). Then slowly slide your foot back down until your leg is flat on the floor again. Angel Wings: Lying on your back spread your legs to the side as far apart as you can without causing discomfort.  A rehabilitation program following serious knee injuries can speed recovery and prevent re-injury in the future due to weakened muscles. Contact your doctor or a physical therapist for more information on knee rehabilitation.   IF YOU ARE TRANSFERRED TO A SKILLED REHAB FACILITY If the patient is transferred to a skilled rehab facility following release from the hospital, a list of the current medications will be sent to the facility for the patient to continue.  When discharged from the skilled rehab facility, please have the facility set up the patient's Home Health Physical Therapy prior to being released. Also, the skilled facility will be responsible for providing the patient with their medications at time of release from the  facility to include their pain medication, the muscle relaxants, and their blood thinner medication. If the patient is still at the rehab facility at time of the two week follow up appointment, the skilled rehab facility will also need to assist the patient in arranging follow up appointment in our office and any transportation needs.  MAKE SURE YOU:  Understand these instructions.  Get help right away if you are not doing well or get worse.    Pick up stool softner and laxative for home use following surgery while on pain medications. Do not submerge incision under water. Please use good hand washing techniques while changing dressing each day. May shower starting three days after surgery. Please use a clean towel to pat the incision dry following showers. Continue to use ice for pain and swelling after surgery. Do not use any lotions or creams on the incision until instructed by your surgeon.   Do not put a pillow under the knee. Place it under the heel.   Complete by:  As directed    Driving restrictions   Complete by:  As directed    No driving for two weeks   TED hose   Complete by:  As directed    Use stockings (TED hose) for three weeks on both leg(s).  You may remove them at night for sleeping.   Weight bearing as tolerated   Complete by:  As directed      Allergies as of 06/12/2018      Reactions   Atorvastatin Other (See Comments)   Muscle weakness in his legs   Crestor [rosuvastatin] Other (See Comments)   Severe muscle cramping   Zocor [simvastatin] Other (See Comments)   Muscle weakness in his legs  Medication List    STOP taking these medications   aspirin 81 MG tablet Replaced by:  aspirin 325 MG EC tablet   HYDROcodone-acetaminophen 5-325 MG tablet Commonly known as:  NORCO/VICODIN   meloxicam 15 MG tablet Commonly known as:  MOBIC     TAKE these medications   ALPRAZolam 0.5 MG tablet Commonly known as:  XANAX TAKE 1 TABLET BY MOUTH THREE TIMES  DAILYAS NEEDED FOR ANXIETY What changed:    how much to take  how to take this  when to take this  reasons to take this   amLODIPine-Valsartan-HCTZ 10-320-25 MG Tabs TAKE 1 TABLET BY MOUTH EVERY DAY   Apple Cider Vinegar 500 MG Tabs Take 500 mg by mouth daily.   aspirin 325 MG EC tablet Take 1 tablet (325 mg total) by mouth 2 (two) times daily for 20 days. Then resume one 81 mg aspirin once a day. Replaces:  aspirin 81 MG tablet   CINNAMON PO Take 200 mg by mouth daily.   ezetimibe 10 MG tablet Commonly known as:  ZETIA Take 1 tablet (10 mg total) by mouth daily.   gabapentin 300 MG capsule Commonly known as:  NEURONTIN Take 1 capsule (300 mg total) by mouth 3 (three) times daily.   Garlic 2000 MG Caps Take 2,000 mg by mouth 2 (two) times daily.   Ginkgo Biloba 120 MG Caps Take 120 mg by mouth daily.   methocarbamol 500 MG tablet Commonly known as:  ROBAXIN Take 1 tablet (500 mg total) by mouth every 6 (six) hours as needed for muscle spasms.   multivitamin tablet Take 1 tablet by mouth daily.   niacin 500 MG CR capsule Take 500 mg by mouth 2 (two) times daily.   omeprazole 20 MG capsule Commonly known as:  PRILOSEC TAKE 1 CAPSULE BY MOUTH EVERY DAY What changed:  how much to take   oxyCODONE 5 MG immediate release tablet Commonly known as:  Oxy IR/ROXICODONE Take 1-2 tablets (5-10 mg total) by mouth every 6 (six) hours as needed for moderate pain (pain score 4-6).   sertraline 25 MG tablet Commonly known as:  ZOLOFT TAKE 1 TABLET (25 MG TOTAL) BY MOUTH DAILY. What changed:  See the new instructions.   sildenafil 100 MG tablet Commonly known as:  VIAGRA Take as directed   testosterone enanthate 200 MG/ML injection Commonly known as:  DELATESTRYL Inject 200 mg into the muscle every 14 (fourteen) days.   traMADol 50 MG tablet Commonly known as:  ULTRAM Take 1-2 tablets (50-100 mg total) by mouth every 6 (six) hours as needed for moderate pain.             Discharge Care Instructions  (From admission, onward)         Start     Ordered   06/12/18 0000  Weight bearing as tolerated     06/12/18 0718   06/12/18 0000  Change dressing    Comments:  Change dressing on Wednesday, then change the dressing daily with sterile 4 x 4 inch gauze dressing and apply TED hose.   06/12/18 1610         Follow-up Information    [name], MD. Schedule an appointment as soon as possible for a visit on 06/26/2018.   Specialty:  Orthopedic Surgery Contact information: [address] [place] [address] [place] [address] [phone]           Signed: Arther Abbott, PA-C Orthopedic Surgery 06/13/2018, 11:53 AM

## 2018-06-15 DIAGNOSIS — M170 Bilateral primary osteoarthritis of knee: Secondary | ICD-10-CM | POA: Diagnosis not present

## 2018-06-15 DIAGNOSIS — M25561 Pain in right knee: Secondary | ICD-10-CM | POA: Diagnosis not present

## 2018-06-15 DIAGNOSIS — Z96652 Presence of left artificial knee joint: Secondary | ICD-10-CM | POA: Diagnosis not present

## 2018-06-19 DIAGNOSIS — Z96652 Presence of left artificial knee joint: Secondary | ICD-10-CM | POA: Diagnosis not present

## 2018-06-19 DIAGNOSIS — M170 Bilateral primary osteoarthritis of knee: Secondary | ICD-10-CM | POA: Diagnosis not present

## 2018-06-19 DIAGNOSIS — M25561 Pain in right knee: Secondary | ICD-10-CM | POA: Diagnosis not present

## 2018-06-21 DIAGNOSIS — M170 Bilateral primary osteoarthritis of knee: Secondary | ICD-10-CM | POA: Diagnosis not present

## 2018-06-21 DIAGNOSIS — M25561 Pain in right knee: Secondary | ICD-10-CM | POA: Diagnosis not present

## 2018-06-21 DIAGNOSIS — Z96652 Presence of left artificial knee joint: Secondary | ICD-10-CM | POA: Diagnosis not present

## 2018-06-26 DIAGNOSIS — Z96652 Presence of left artificial knee joint: Secondary | ICD-10-CM | POA: Diagnosis not present

## 2018-06-26 DIAGNOSIS — M170 Bilateral primary osteoarthritis of knee: Secondary | ICD-10-CM | POA: Diagnosis not present

## 2018-06-26 DIAGNOSIS — M25561 Pain in right knee: Secondary | ICD-10-CM | POA: Diagnosis not present

## 2018-07-03 DIAGNOSIS — M170 Bilateral primary osteoarthritis of knee: Secondary | ICD-10-CM | POA: Diagnosis not present

## 2018-07-03 DIAGNOSIS — M25561 Pain in right knee: Secondary | ICD-10-CM | POA: Diagnosis not present

## 2018-07-03 DIAGNOSIS — Z96652 Presence of left artificial knee joint: Secondary | ICD-10-CM | POA: Diagnosis not present

## 2018-07-05 DIAGNOSIS — M170 Bilateral primary osteoarthritis of knee: Secondary | ICD-10-CM | POA: Diagnosis not present

## 2018-07-05 DIAGNOSIS — G4733 Obstructive sleep apnea (adult) (pediatric): Secondary | ICD-10-CM | POA: Diagnosis not present

## 2018-07-05 DIAGNOSIS — M25561 Pain in right knee: Secondary | ICD-10-CM | POA: Diagnosis not present

## 2018-07-05 DIAGNOSIS — Z96652 Presence of left artificial knee joint: Secondary | ICD-10-CM | POA: Diagnosis not present

## 2018-07-09 DIAGNOSIS — R972 Elevated prostate specific antigen [PSA]: Secondary | ICD-10-CM | POA: Diagnosis not present

## 2018-07-12 DIAGNOSIS — M170 Bilateral primary osteoarthritis of knee: Secondary | ICD-10-CM | POA: Diagnosis not present

## 2018-07-12 DIAGNOSIS — Z96652 Presence of left artificial knee joint: Secondary | ICD-10-CM | POA: Diagnosis not present

## 2018-07-12 DIAGNOSIS — M25561 Pain in right knee: Secondary | ICD-10-CM | POA: Diagnosis not present

## 2018-07-16 DIAGNOSIS — M170 Bilateral primary osteoarthritis of knee: Secondary | ICD-10-CM | POA: Diagnosis not present

## 2018-07-16 DIAGNOSIS — Z96652 Presence of left artificial knee joint: Secondary | ICD-10-CM | POA: Diagnosis not present

## 2018-07-16 DIAGNOSIS — M25561 Pain in right knee: Secondary | ICD-10-CM | POA: Diagnosis not present

## 2018-07-17 DIAGNOSIS — Z471 Aftercare following joint replacement surgery: Secondary | ICD-10-CM | POA: Diagnosis not present

## 2018-07-17 DIAGNOSIS — Z96652 Presence of left artificial knee joint: Secondary | ICD-10-CM | POA: Diagnosis not present

## 2018-07-23 ENCOUNTER — Ambulatory Visit: Payer: PPO | Admitting: Podiatry

## 2018-07-23 DIAGNOSIS — Z96652 Presence of left artificial knee joint: Secondary | ICD-10-CM | POA: Diagnosis not present

## 2018-07-23 DIAGNOSIS — M25561 Pain in right knee: Secondary | ICD-10-CM | POA: Diagnosis not present

## 2018-07-23 DIAGNOSIS — M170 Bilateral primary osteoarthritis of knee: Secondary | ICD-10-CM | POA: Diagnosis not present

## 2018-07-24 DIAGNOSIS — M722 Plantar fascial fibromatosis: Secondary | ICD-10-CM | POA: Diagnosis not present

## 2018-07-26 DIAGNOSIS — Z96652 Presence of left artificial knee joint: Secondary | ICD-10-CM | POA: Diagnosis not present

## 2018-07-26 DIAGNOSIS — M170 Bilateral primary osteoarthritis of knee: Secondary | ICD-10-CM | POA: Diagnosis not present

## 2018-07-26 DIAGNOSIS — M25561 Pain in right knee: Secondary | ICD-10-CM | POA: Diagnosis not present

## 2018-07-29 ENCOUNTER — Encounter: Payer: Self-pay | Admitting: Internal Medicine

## 2018-07-30 ENCOUNTER — Ambulatory Visit: Payer: PPO | Admitting: Internal Medicine

## 2018-07-30 ENCOUNTER — Encounter: Payer: Self-pay | Admitting: Internal Medicine

## 2018-07-30 ENCOUNTER — Ambulatory Visit: Payer: PPO | Admitting: Pulmonary Disease

## 2018-07-30 VITALS — BP 110/68 | HR 95 | Ht 72.0 in | Wt 282.0 lb

## 2018-07-30 DIAGNOSIS — G4733 Obstructive sleep apnea (adult) (pediatric): Secondary | ICD-10-CM | POA: Diagnosis not present

## 2018-07-30 DIAGNOSIS — Z9989 Dependence on other enabling machines and devices: Secondary | ICD-10-CM | POA: Diagnosis not present

## 2018-07-30 DIAGNOSIS — M170 Bilateral primary osteoarthritis of knee: Secondary | ICD-10-CM

## 2018-07-30 NOTE — Patient Instructions (Signed)
Order- [facility]- please replace old CPAP machine, changing to auto 10-20, mask of choice, humidifier, supplies, AirView  Please call if we can help

## 2018-07-30 NOTE — Progress Notes (Signed)
Subjective:    Patient ID: Dakota Ellison, male    DOB: 08-18-1950, 68 y.o.   MRN: [id number]  HPI  male Never smoker followed for OSA complicated by morbid obesity, HBP, asthma, GERD NPSG 2010, AHI 82/ hr CPAP to 20  ------------------------------------------------------------------------------------  07/31/17- 68 year old male never smoker followed for OSA, complicated by morbid obesity, HBP, asthma, GERD,  CPAP 16/Advanced OSA: DME: [facility]. Pt wears CPAP and DL attached. No new supplies needed at this time.  Download 100% compliance, AHI 3.6/hour.  He states he is comfortable with his machine "fine" and not needing anything at this time.  He is not yet eligible for replacement machine.  When the time comes, I suggested we may want to change over to AutoPap for flexibility. He denies other active medical concerns at this visit.  07/30/2018- 68 year old male never smoker followed for OSA, complicated by morbid obesity, HBP, asthma, GERD, ( works transporting inmates) CPAP 16/Advanced>> replacing old machine, change to auto 10-20 Download 96% compliance, AHI 3.2/ hr Had left TKR 06/12/2018 -----Doing well at this time. He would like a new cpap machine it has been since 2014.Dme [facility] Body weight today 282 pounds   ROS-see HPI   + = positive Constitutional:    weight loss, night sweats, fevers, chills, fatigue, lassitude. HEENT:    headaches, difficulty swallowing, tooth/dental problems, sore throat,       sneezing, itching, ear ache, nasal congestion, post nasal drip, snoring CV:    chest pain, orthopnea, PND, swelling in lower extremities, anasarca,                                  dizziness, palpitations Resp:   shortness of breath with exertion or at rest.                productive cough,   non-productive cough, coughing up of blood.              change in color of mucus.  wheezing.   Skin:    rash or lesions. GI:  No-   heartburn, indigestion, abdominal pain, nausea, vomiting, diarrhea,                   change in bowel habits, loss of appetite GU: dysuria, change in color of urine, no urgency or frequency.   flank pain. MS:   joint pain, stiffness, decreased range of motion, back pain. Neuro-     nothing unusual Psych:  change in mood or affect.  depression or anxiety.   memory loss.    Objective:   OBJ- Physical Exam       General- Alert, Oriented, Affect-appropriate, Distress- none acute, + obese/big man Skin- rash-none, lesions- none, excoriation- none Lymphadenopathy- none Head- atraumatic            Eyes- Gross vision intact, PERRLA, conjunctivae and secretions clear            Ears- Hearing, canals-normal            Nose- Clear, no-Septal dev, mucus, polyps, erosion, perforation             Throat- Mallampati III , mucosa clear , drainage- none, tonsils- atrophic Neck- flexible , trachea midline, no stridor , thyroid nl, carotid no bruit Chest - symmetrical excursion , unlabored           Heart/CV- RRR/ occ skip , no murmur , no gallop  ,  no rub, nl s1 s2                           - JVD- none , edema- none, stasis changes- none, varices- none           Lung- clear to P&A, wheeze- none, cough- none , dullness-none, rub- none           Chest wall-  Abd-  Br/ Gen/ Rectal- Not done, not indicated Extrem- cyanosis- none, clubbing, none, atrophy- none, strength- nl, + healing TKR incision Neuro- grossly intact to observation    Assessment & Plan:

## 2018-07-31 DIAGNOSIS — Z96652 Presence of left artificial knee joint: Secondary | ICD-10-CM | POA: Diagnosis not present

## 2018-07-31 DIAGNOSIS — M170 Bilateral primary osteoarthritis of knee: Secondary | ICD-10-CM | POA: Diagnosis not present

## 2018-07-31 DIAGNOSIS — M25561 Pain in right knee: Secondary | ICD-10-CM | POA: Diagnosis not present

## 2018-08-01 ENCOUNTER — Telehealth: Payer: Self-pay | Admitting: Internal Medicine

## 2018-08-01 NOTE — Telephone Encounter (Signed)
Returned phone call.  Could not see that anybody called pt.  Pt stated Dr Jodelle Green name appeared on his phone.  His voice mail was full so no message.  Only order from 07/30/18 was CPAP and supplies to DME.  Instructed pt to call us back if he has not heard from the DME company in a week or so.  Nothing further is needed.

## 2018-08-02 DIAGNOSIS — M47816 Spondylosis without myelopathy or radiculopathy, lumbar region: Secondary | ICD-10-CM | POA: Diagnosis not present

## 2018-08-02 DIAGNOSIS — M4316 Spondylolisthesis, lumbar region: Secondary | ICD-10-CM | POA: Diagnosis not present

## 2018-08-03 DIAGNOSIS — M170 Bilateral primary osteoarthritis of knee: Secondary | ICD-10-CM | POA: Diagnosis not present

## 2018-08-03 DIAGNOSIS — Z96652 Presence of left artificial knee joint: Secondary | ICD-10-CM | POA: Diagnosis not present

## 2018-08-03 DIAGNOSIS — M25561 Pain in right knee: Secondary | ICD-10-CM | POA: Diagnosis not present

## 2018-08-06 NOTE — Assessment & Plan Note (Signed)
He continues to do well, benefiting from CPAP with improved sleep.  Plan- replace old machine, change to auto 10-20

## 2018-08-06 NOTE — Assessment & Plan Note (Signed)
He tolerated TKR with no reported respiratory complications. Discussed improving mobility as an opportunity to lose some weight, which would help his OSA.

## 2018-08-08 DIAGNOSIS — M25572 Pain in left ankle and joints of left foot: Secondary | ICD-10-CM | POA: Diagnosis not present

## 2018-08-08 DIAGNOSIS — G5762 Lesion of plantar nerve, left lower limb: Secondary | ICD-10-CM | POA: Diagnosis not present

## 2018-08-13 DIAGNOSIS — M25572 Pain in left ankle and joints of left foot: Secondary | ICD-10-CM | POA: Diagnosis not present

## 2018-08-13 DIAGNOSIS — G5762 Lesion of plantar nerve, left lower limb: Secondary | ICD-10-CM | POA: Diagnosis not present

## 2018-08-14 DIAGNOSIS — M4316 Spondylolisthesis, lumbar region: Secondary | ICD-10-CM | POA: Diagnosis not present

## 2018-08-15 ENCOUNTER — Other Ambulatory Visit: Payer: Self-pay | Admitting: Neurosurgery

## 2018-08-15 DIAGNOSIS — M25572 Pain in left ankle and joints of left foot: Secondary | ICD-10-CM | POA: Diagnosis not present

## 2018-08-15 DIAGNOSIS — G5762 Lesion of plantar nerve, left lower limb: Secondary | ICD-10-CM | POA: Diagnosis not present

## 2018-08-15 DIAGNOSIS — M4316 Spondylolisthesis, lumbar region: Secondary | ICD-10-CM

## 2018-08-27 DIAGNOSIS — G5762 Lesion of plantar nerve, left lower limb: Secondary | ICD-10-CM | POA: Diagnosis not present

## 2018-08-27 DIAGNOSIS — M25572 Pain in left ankle and joints of left foot: Secondary | ICD-10-CM | POA: Diagnosis not present

## 2018-08-29 DIAGNOSIS — M25572 Pain in left ankle and joints of left foot: Secondary | ICD-10-CM | POA: Diagnosis not present

## 2018-08-29 DIAGNOSIS — G5762 Lesion of plantar nerve, left lower limb: Secondary | ICD-10-CM | POA: Diagnosis not present

## 2018-08-30 ENCOUNTER — Other Ambulatory Visit: Payer: PPO

## 2018-09-04 ENCOUNTER — Other Ambulatory Visit: Payer: Self-pay | Admitting: Pulmonary Disease

## 2018-09-04 ENCOUNTER — Other Ambulatory Visit: Payer: Self-pay | Admitting: Cardiovascular Disease

## 2018-09-04 NOTE — Telephone Encounter (Signed)
Please review for refill.  Not sure why patient asking Dr. Mariah Milling to refill Sertraline.

## 2018-09-05 NOTE — Telephone Encounter (Signed)
When the patient saw Dr. Mariah Milling 04/02/18,  He was given the names of 3 PCP doctors he could establish with .   Sertraline will need to be filled by his PCP.

## 2018-09-06 ENCOUNTER — Other Ambulatory Visit: Payer: Self-pay | Admitting: Pulmonary Disease

## 2018-09-06 NOTE — Telephone Encounter (Signed)
We can refill his sertraline for a few months until he is able to establish care with Dr. Alphonsus Sias in 10/2018 thx Maris Berger

## 2018-09-06 NOTE — Telephone Encounter (Signed)
Patient calling to let us know he has an appt with [facility] but not for 2 more months.  Patient would appreciate it if Dr. Mariah Milling would refill this med for an additional 90 day  grace period .

## 2018-09-06 NOTE — Telephone Encounter (Signed)
rqst routed to [facility]

## 2018-09-07 MED ORDER — SERTRALINE HCL 25 MG PO TABS: ORAL_TABLET | ORAL | 0 refills | Status: DC

## 2018-09-07 NOTE — Telephone Encounter (Signed)
Sertraline 25mg  daily has been refilled #90 R-0. Note to pharmacy future refill rqst to be sent to the pt pcp.

## 2018-09-07 NOTE — Telephone Encounter (Signed)
Hopefully we will be able to do visits by May 5th. If not, can consider a brief WebEx visit just to establish care and defer the full establish visit for later

## 2018-09-07 NOTE — Addendum Note (Signed)
Addended by: Jarvis Newcomer on: 09/07/2018 07:21 AM   Modules accepted: Orders

## 2018-10-04 ENCOUNTER — Telehealth: Payer: Self-pay | Admitting: Internal Medicine

## 2018-10-04 NOTE — Telephone Encounter (Signed)
Dakota Ellison stated they had reached out a few times and he could call them at see note  [place], [place], [place], [place]; [place], [place]ta Quill        Hey Dakota Ellison!   Looks like we tried to reach him multiple times and left messages when we got the order in Feb.  He can call our RT team to schedule at [phone] ext [phone].   Does this help?   Thanks!  Dakota Ellison   Previous Messages    ----- Message -----  From: Dakota Ellison  Sent: 10/04/2018  2:14 PM EDT  To: Dakota Ellison, Etta Quill [place]  Subject: cpap                       31-Oct-1950  Order was placed in feb for this but the patient still hasn't heard anything. Thanks Dakota Ellison     I called and gave the patient the info and he was going to call them

## 2018-10-04 NOTE — Telephone Encounter (Signed)
Message sent to [facility] 

## 2018-10-04 NOTE — Telephone Encounter (Signed)
Dakota Ellison it appears this order was placed in Feb 2020. Can you find out what is needed? Pt has not heard anything from [facility].

## 2018-10-09 ENCOUNTER — Encounter: Payer: Self-pay | Admitting: Internal Medicine

## 2018-10-09 ENCOUNTER — Telehealth: Payer: Self-pay | Admitting: Internal Medicine

## 2018-10-09 ENCOUNTER — Ambulatory Visit (INDEPENDENT_AMBULATORY_CARE_PROVIDER_SITE_OTHER): Payer: PPO | Admitting: Internal Medicine

## 2018-10-09 DIAGNOSIS — F39 Unspecified mood [affective] disorder: Secondary | ICD-10-CM

## 2018-10-09 DIAGNOSIS — M48061 Spinal stenosis, lumbar region without neurogenic claudication: Secondary | ICD-10-CM

## 2018-10-09 DIAGNOSIS — I10 Essential (primary) hypertension: Secondary | ICD-10-CM

## 2018-10-09 DIAGNOSIS — F1120 Opioid dependence, uncomplicated: Secondary | ICD-10-CM | POA: Diagnosis not present

## 2018-10-09 DIAGNOSIS — K219 Gastro-esophageal reflux disease without esophagitis: Secondary | ICD-10-CM | POA: Diagnosis not present

## 2018-10-09 MED ORDER — ALPRAZOLAM 0.5 MG PO TABS: 0.5000 mg | ORAL_TABLET | Freq: Two times a day (BID) | ORAL | 0 refills | Status: DC | PRN

## 2018-10-09 NOTE — Progress Notes (Signed)
Subjective:    Patient ID: Dakota Ellison, male    DOB: Apr 06, 1951, 68 y.o.   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 Also with spinal stenosis---formerly saw Dr Channing Mutters Does get pain with prolonged walking or standing Will need someone to Rx the hydrocodone as well---uses tid regularly  Gets testosterone injections--he does them Dr Retta Diones monitors this  Gets some ongoing anxiety No panic Will have occasional ups and downs---no major depression Uses the xanax bid usually--discussed trying prn more  Rx for HTN Did go to cardiologist--now Dr Mariah Milling Told he has a leaky valve No chest pain No SOB No dizziness or syncope  Current Outpatient Medications on File Prior to Visit  Medication Sig Dispense Refill  . ALPRAZolam (XANAX) 0.5 MG tablet TAKE 1 TABLET BY MOUTH THREE TIMES DAILYAS NEEDED FOR ANXIETY (Patient taking differently: Take 0.5 mg by mouth 3 (three) times daily as needed for anxiety. TAKE 1 TABLET BY MOUTH THREE TIMES DAILYAS NEEDED FOR ANXIETY) 90 tablet 3  . amLODIPine-Valsartan-HCTZ 10-320-25 MG TABS TAKE 1 TABLET BY MOUTH EVERY DAY (Patient taking differently: Take 1 tablet by mouth daily. ) 90 tablet 4  . Apple Cider Vinegar 500 MG TABS Take 500 mg by mouth daily.     Marland Kitchen CINNAMON PO Take 200 mg by mouth daily.    Marland Kitchen ezetimibe (ZETIA) 10 MG tablet TAKE 1 TABLET BY MOUTH DAILY 90 tablet 1  . Garlic 2000 MG CAPS Take 2,000 mg by mouth 2 (two) times daily.     . Ginkgo Biloba 120 MG CAPS Take 120 mg by mouth daily.     Marland Kitchen HYDROcodone-acetaminophen (NORCO/VICODIN) 5-325 MG tablet Take 1 tablet by mouth 3 (three) times daily.     . Multiple Vitamin (MULTIVITAMIN) tablet Take 1 tablet by mouth daily.      . niacin 500 MG CR capsule Take 500 mg by mouth 2 (two) times daily.     Marland Kitchen omeprazole (PRILOSEC) 20 MG capsule TAKE 1 CAPSULE BY MOUTH EVERY DAY (Patient taking differently: Take 20 mg by mouth daily. ) 90 capsule 3  . sertraline (ZOLOFT) 25 MG tablet TAKE 1 TABLET (25 MG TOTAL) BY MOUTH DAILY. 90 tablet 0  . sildenafil (VIAGRA) 100 MG tablet Take as directed 10 tablet 5  . testosterone enanthate (DELATESTRYL) 200 MG/ML injection Inject 200 mg into the muscle every 14 (fourteen) days.      No current facility-administered medications on file prior to visit.     Allergies  Allergen Reactions  . Atorvastatin Other (See Comments)    Muscle weakness in his legs  . Crestor [Rosuvastatin] Other (See Comments)    Severe muscle cramping  . Zocor [Simvastatin] Other (See Comments)    Muscle weakness in his legs    Past Medical History:  Diagnosis Date  . Anxiety state 04/08/2014   denies  . Complication of anesthesia    and trouble waking up  . Degenerative disc disease   . Dysthymia    denies  . ED (erectile dysfunction)   . GERD (gastroesophageal reflux disease)   . History of MRSA infection    on lip from cut  .  Hyperlipemia   . Hypertension    take BP med for small leak in heart to keep it small per pt.  . Mild asthma    as a teen  . Morbid obesity (HCC)   . OSA (obstructive sleep apnea)   . Osteoarthritis   . PONV (postoperative nausea and vomiting)   . Rectal fissure   . Testosterone deficiency     Past Surgical History:  Procedure Laterality Date  . HERNIA REPAIR     1998  . KNEE SURGERY  09/2010   left knee and right knee arthroscopy  . OTHER SURGICAL HISTORY     arthroscopic subacromial decompression  . OTHER SURGICAL HISTORY     open resection, distal right clavicle  . SHOULDER ARTHROSCOPY     right  . SPHINCTEROTOMY    . TOTAL KNEE ARTHROPLASTY Left 06/11/2018   Procedure: LEFT  TOTAL KNEE ARTHROPLASTY;  Surgeon: Ollen GrossAluisio, Frank, MD;  Location: [facility];  Service: Orthopedics;  Laterality: Left;  50min    Family History  Problem Relation Age of Onset  . Pneumonia Brother     Social History   Socioeconomic History  . Marital status: Single    Spouse name: Not on file  . Number of children: 1  . Years of education: Not on file  . Highest education level: Not on file  Occupational History  . Occupation: long Estate agentdistance driver    Comment: Retired  . Occupation: Transports prisoners part time    Comment[place]  Social Needs  . Financial resource strain: Not on file  . Food insecurity:    Worry: Not on file    Inability: Not on file  . Transportation needs:    Medical: Not on file    Non-medical: Not on file  Tobacco Use  . Smoking status: Never Smoker  . Smokeless tobacco: Never Used  . Tobacco comment: CIGARS ONLY--QUIT 2013  Substance and Sexual Activity  . Alcohol use: No    Alcohol/week: 0.0 standard drinks    Comment: rare//occassional  . Drug use: No  . Sexual activity: Yes  Lifestyle  . Physical activity:    Days per week: Not on file    Minutes per session: Not on file  . Stress: Not on file  Relationships  . Social connections:    Talks on phone: Not on file    Gets together: Not on file    Attends religious service: Not on file    Active member of club or organization: Not on file    Attends meetings of clubs or organizations: Not on file    Relationship status: Not on file  . Intimate partner violence:    Fear of current or ex partner: Not on file    Emotionally abused: Not on file    Physically abused: Not on file    Forced sexual activity: Not on file  Other Topics Concern  . Not on file  Social History Narrative  . Not on file   Review of Systems Past asthma history--no issues since age 68 Sleeps well Appetite is fine Voids okay No chest pain or palpitations Breathing is okay--no regular cough Weight down  slightly since TKR No skin issues Some mood issues as mentioned     Objective:   Physical Exam  Constitutional: He appears well-developed. No distress.  Respiratory: Effort normal. No respiratory distress.  Psychiatric: He has a normal mood and affect. His behavior is normal.  Assessment & Plan:

## 2018-10-09 NOTE — Assessment & Plan Note (Signed)
With chronic pain and some degree of disability Asks me to take over hydrocodone Rx--as of July Discussed--will limit to 3 per day

## 2018-10-09 NOTE — Assessment & Plan Note (Signed)
Controlled on the medication

## 2018-10-09 NOTE — Assessment & Plan Note (Signed)
BMI ~38 with HTN, osteoarthritis, OSA, etc Discussed healthy eating and increasing activity as able

## 2018-10-09 NOTE — Assessment & Plan Note (Signed)
Reviewed PDMP--no surprises

## 2018-10-09 NOTE — Telephone Encounter (Signed)
I left a detailed message on patient's voice mail to call back and schedule Annual Wellness Visit with Dr.Letvak in 4-5 months.

## 2018-10-09 NOTE — Assessment & Plan Note (Signed)
BP Readings from Last 3 Encounters:  07/30/18 110/68  06/12/18 136/79  06/08/18 126/71   Good control Will check labs at next visit

## 2018-10-09 NOTE — Assessment & Plan Note (Signed)
Mostly anxiety Has some issues with chronic pain as well Will continue the sertraline and xanax---discussed trying to use only prn

## 2018-10-23 DIAGNOSIS — M25361 Other instability, right knee: Secondary | ICD-10-CM | POA: Diagnosis not present

## 2018-10-23 DIAGNOSIS — M25561 Pain in right knee: Secondary | ICD-10-CM | POA: Diagnosis not present

## 2018-10-30 DIAGNOSIS — M25561 Pain in right knee: Secondary | ICD-10-CM | POA: Diagnosis not present

## 2018-11-01 DIAGNOSIS — S83412D Sprain of medial collateral ligament of left knee, subsequent encounter: Secondary | ICD-10-CM | POA: Diagnosis not present

## 2018-11-01 DIAGNOSIS — M23612 Other spontaneous disruption of anterior cruciate ligament of left knee: Secondary | ICD-10-CM | POA: Diagnosis not present

## 2018-11-01 DIAGNOSIS — M1712 Unilateral primary osteoarthritis, left knee: Secondary | ICD-10-CM | POA: Diagnosis not present

## 2018-11-05 ENCOUNTER — Telehealth: Payer: Self-pay | Admitting: Cardiovascular Disease

## 2018-11-05 NOTE — Telephone Encounter (Addendum)
   Primary Cardiologist: [name], MD  Chart reviewed as part of pre-operative protocol coverage. Patient was contacted 11/05/2018 in reference to pre-operative risk assessment for pending surgery as outlined below.  Dakota Ellison was last seen on 04/02/18 by Dr. Mariah Milling. He has h/o OSA, morbid obesity, HTN, HLD, asthma, GERD, OA. Had cardiac CT scoring with calcium score of zero in 03/2018, also with remote normal stress test. At his visit Dr. Mariah Milling cleared him to proceed with knee surgery. This is a different knee. RCRI is 0.4% indicating low risk of CV complications. I spoke with patient who affirms he is doing well without any new cardiac symptoms. He is able to achieve >4 METS without angina. He does a lot of yardwork, mowing and bushwacking. Therefore, based on ACC/AHA guidelines, the patient would be at acceptable risk for the planned procedure without further cardiovascular testing. He does not appear to be on any antiplatelet medication by our records.  I will route this recommendation to the requesting party via Epic fax function and remove from pre-op pool.  Please call with questions.  Dakota Montana, PA-C 11/05/2018, 2:27 PM

## 2018-11-05 NOTE — Telephone Encounter (Signed)
   [facility] Pre-operative Risk Assessment    Request for surgical clearance:  1. What type of surgery is being performed? Right total knee  2. When is this surgery scheduled? 12/31/2018  3. What type of clearance is required (medical clearance vs. Pharmacy clearance to hold med vs. Both)? Medical clearance  4. Are there any medications that need to be held prior to surgery and how long? Not listed  5. Practice name and name of physician performing surgery? [facility], Dr.  Pilar Plate Aluisio  6. What is your office phone number? [phone]   7.   What is your office fax number [phone]  8.   Anesthesia type (None, local, MAC, general) ? Not listed   Dakota Ellison 11/05/2018, 12:10 PM  _________________________________________________________________   (provider comments below)

## 2018-11-09 ENCOUNTER — Other Ambulatory Visit: Payer: Self-pay | Admitting: Internal Medicine

## 2018-11-12 NOTE — Telephone Encounter (Signed)
Last filled 10-09-18 #60 Last OV 10-09-18 Next OV 12-05-18 [facility]

## 2018-12-05 ENCOUNTER — Telehealth: Payer: Self-pay | Admitting: Internal Medicine

## 2018-12-05 ENCOUNTER — Encounter: Payer: Self-pay | Admitting: Internal Medicine

## 2018-12-05 ENCOUNTER — Other Ambulatory Visit: Payer: Self-pay

## 2018-12-05 ENCOUNTER — Ambulatory Visit (INDEPENDENT_AMBULATORY_CARE_PROVIDER_SITE_OTHER): Payer: PPO | Admitting: Internal Medicine

## 2018-12-05 VITALS — BP 102/64 | HR 85 | Temp 98.3°F | Ht 72.0 in | Wt 281.0 lb

## 2018-12-05 DIAGNOSIS — Z01818 Encounter for other preprocedural examination: Secondary | ICD-10-CM | POA: Diagnosis not present

## 2018-12-05 DIAGNOSIS — F1120 Opioid dependence, uncomplicated: Secondary | ICD-10-CM

## 2018-12-05 DIAGNOSIS — G4733 Obstructive sleep apnea (adult) (pediatric): Secondary | ICD-10-CM

## 2018-12-05 MED ORDER — OMEPRAZOLE 20 MG PO CPDR: 20.0000 mg | DELAYED_RELEASE_CAPSULE | Freq: Every day | ORAL | 3 refills | Status: DC

## 2018-12-05 MED ORDER — HYDROCODONE-ACETAMINOPHEN 5-325 MG PO TABS: 1.0000 | ORAL_TABLET | Freq: Three times a day (TID) | ORAL | 0 refills | Status: DC

## 2018-12-05 MED ORDER — EZETIMIBE 10 MG PO TABS: 10.0000 mg | ORAL_TABLET | Freq: Every day | ORAL | 3 refills | Status: DC

## 2018-12-05 MED ORDER — ALPRAZOLAM 0.5 MG PO TABS: 0.5000 mg | ORAL_TABLET | Freq: Two times a day (BID) | ORAL | 0 refills | Status: DC | PRN

## 2018-12-05 MED ORDER — AMLODIPINE-VALSARTAN-HCTZ 10-320-25 MG PO TABS: 1.0000 | ORAL_TABLET | Freq: Every day | ORAL | 3 refills | Status: DC

## 2018-12-05 MED ORDER — SERTRALINE HCL 25 MG PO TABS: ORAL_TABLET | ORAL | 3 refills | Status: DC

## 2018-12-05 NOTE — Progress Notes (Signed)
Subjective:    Patient ID: Dakota Ellison, male    DOB: [birth year], 68 y.o.   MRN: [id number]  HPI Here for preoperative evaluation  He feels okay Had other knee done in January Did outpatient rehab and cleared to work earlier than expected Will go to [employer] [facility]  No chest pain No SOB No dizziness or syncope No edema No palpitations  No recent cough or fever  Chronic back and knee pain DDD lumbar and spinal stenosis Left knee still some pain laterally--could be mechanical  Current Outpatient Medications on File Prior to Visit  Medication Sig Dispense Refill  . ALPRAZolam (XANAX) 0.5 MG tablet TAKE 1 TABLET BY MOUTH TWICE DAILY AS NEEDED 60 tablet 0  . amLODIPine-Valsartan-HCTZ 10-320-25 MG TABS TAKE 1 TABLET BY MOUTH EVERY DAY (Patient taking differently: Take 1 tablet by mouth daily. ) 90 tablet 4  . Apple Cider Vinegar 500 MG TABS Take 500 mg by mouth daily.     Marland Kitchen. CINNAMON PO Take 200 mg by mouth daily.    Marland Kitchen. ezetimibe (ZETIA) 10 MG tablet TAKE 1 TABLET BY MOUTH DAILY 90 tablet 1  . Garlic 2000 MG CAPS Take 2,000 mg by mouth 2 (two) times daily.     . Ginkgo Biloba 120 MG CAPS Take 120 mg by mouth daily.     Marland Kitchen. HYDROcodone-acetaminophen (NORCO/VICODIN) 5-325 MG tablet Take 1 tablet by mouth 3 (three) times daily.    . Multiple Vitamin (MULTIVITAMIN) tablet Take 1 tablet by mouth daily.      . niacin 500 MG CR capsule Take 500 mg by mouth 2 (two) times daily.     Marland Kitchen. omeprazole (PRILOSEC) 20 MG capsule TAKE 1 CAPSULE BY MOUTH EVERY DAY (Patient taking differently: Take 20 mg by mouth daily. ) 90 capsule 3  . sertraline (ZOLOFT) 25 MG tablet TAKE 1 TABLET (25 MG TOTAL) BY MOUTH DAILY. 90 tablet 0  . sildenafil (VIAGRA) 100 MG tablet Take as directed 10 tablet 5  . testosterone enanthate (DELATESTRYL) 200 MG/ML injection Inject 200 mg into the muscle every 14 (fourteen) days.      No current facility-administered medications on file prior to visit.     Allergies  Allergen  Reactions  . Atorvastatin Other (See Comments)    Muscle weakness in his legs  . Crestor [Rosuvastatin] Other (See Comments)    Severe muscle cramping  . Zocor [Simvastatin] Other (See Comments)    Muscle weakness in his legs    Past Medical History:  Diagnosis Date  . Anxiety state 04/08/2014   denies  . Complication of anesthesia    and trouble waking up  . Degenerative disc disease   . Dysthymia    denies  . ED (erectile dysfunction)   . GERD (gastroesophageal reflux disease)   . History of MRSA infection    on lip from cut  . Hyperlipemia   . Hypertension    take BP med for small leak in heart to keep it small per pt.  . Mild asthma    as a teen  . Morbid obesity (HCC)   . OSA (obstructive sleep apnea)   . Osteoarthritis   . PONV (postoperative nausea and vomiting)   . Rectal fissure   . Testosterone deficiency     Past Surgical History:  Procedure Laterality Date  . HERNIA REPAIR     1998  . KNEE SURGERY  09/2010   left knee and right knee arthroscopy  . OTHER SURGICAL HISTORY  arthroscopic subacromial decompression  . OTHER SURGICAL HISTORY     open resection, distal right clavicle  . SHOULDER ARTHROSCOPY     right  . SPHINCTEROTOMY    . TOTAL KNEE ARTHROPLASTY Left 06/11/2018   Procedure: LEFT TOTAL KNEE ARTHROPLASTY;  Surgeon: Gaynelle Arabian, MD;  Location: [facility];  Service: Orthopedics;  Laterality: Left;  10min    Family History  Problem Relation Age of Onset  . Pneumonia Brother     Social History   Socioeconomic History  . Marital status: Single    Spouse name: Not on file  . Number of children: 1  . Years of education: Not on file  . Highest education level: Not on file  Occupational History  . Occupation: long Air traffic controller    Comment: Retired  . Occupation: Transports prisoners part time    Comment: [place]  . Financial resource strain: Not on file  . Food insecurity    Worry: Not on file     Inability: Not on file  . Transportation needs    Medical: Not on file    Non-medical: Not on file  Tobacco Use  . Smoking status: Never Smoker  . Smokeless tobacco: Never Used  . Tobacco comment: CIGARS ONLY--QUIT 2013  Substance and Sexual Activity  . Alcohol use: No    Alcohol/week: 0.0 standard drinks    Comment: rare//occassional  . Drug use: No  . Sexual activity: Yes  Lifestyle  . Physical activity    Days per week: Not on file    Minutes per session: Not on file  . Stress: Not on file  Relationships  . Social Herbalist on phone: Not on file    Gets together: Not on file    Attends religious service: Not on file    Active member of club or organization: Not on file    Attends meetings of clubs or organizations: Not on file    Relationship status: Not on file  . Intimate partner violence    Fear of current or ex partner: Not on file    Emotionally abused: Not on file    Physically abused: Not on file    Forced sexual activity: Not on file  Other Topics Concern  . Not on file  Social History Narrative  . Not on file   Review of Systems Appetite is good Weight is stable or slightly down Sleeps well with the CPAP    Objective:   Physical Exam  Constitutional: He appears well-developed. No distress.  Neck: No thyromegaly present.  Cardiovascular: Normal rate, regular rhythm and normal heart sounds. Exam reveals no gallop.  No murmur heard. Rare skips  Respiratory: Effort normal and breath sounds normal. No respiratory distress. He has no wheezes. He has no rales.  GI: Soft. There is no abdominal tenderness.  Musculoskeletal:        General: No edema.  Lymphadenopathy:    He has no cervical adenopathy.  Psychiatric: He has a normal mood and affect. His behavior is normal.           Assessment & Plan:

## 2018-12-05 NOTE — Assessment & Plan Note (Signed)
No concerns on exam or his history Did very well with TKR in January EKG shows sinus with 2 PACs/1 PVC. Low voltage in inferior leads with non specific T changes. Since 04/02/18, the low voltage is new and the T changes are more like EKG in 2018. I do not think these are a problem Okay to proceed with surgery as planned I would recommend 2 weeks of DVT prophylaxis Okay for Dr Maureen Ralphs to give 5 days of narcotics post op

## 2018-12-05 NOTE — Assessment & Plan Note (Signed)
PDMP reviewed Will check [employer] Needs contract--will do today

## 2018-12-05 NOTE — Telephone Encounter (Signed)
[facility], line went to voicemail. LMTCB. I am sending the order for replacement CPAP machine. Will keep encounter open to f/u should pt return call.

## 2018-12-06 NOTE — Telephone Encounter (Signed)
Spoke with patient. Advised him that we had replaced the order to [facility]. I also gave him the direct number to the respiratory dept at [facility] to follow up with them as we were told back in April that they had to tried to contact him but he did not respond. He stated that he had called them and someone told him they would call back, they never did.   He verbalized understanding. Nothing further needed at time of call.

## 2018-12-06 NOTE — Telephone Encounter (Signed)
Pt returning call regarding his cpap machine. If somone could give him a call back

## 2018-12-08 LAB — PAIN MGMT, PROFILE 8 W/CONF, U
6 Acetylmorphine: NEGATIVE ng/mL
Alcohol Metabolites: NEGATIVE ng/mL (ref <500)
Amphetamines: NEGATIVE ng/mL
Benzodiazepines: NEGATIVE ng/mL
Buprenorphine, Urine: NEGATIVE ng/mL
Cocaine Metabolite: NEGATIVE ng/mL
Codeine: NEGATIVE ng/mL
Creatinine: 232 mg/dL
Hydrocodone: 343 ng/mL
Hydromorphone: 118 ng/mL
MDMA: NEGATIVE ng/mL
Marijuana Metabolite: NEGATIVE ng/mL
Morphine: NEGATIVE ng/mL
Norhydrocodone: 307 ng/mL
Opiates: POSITIVE ng/mL
Oxidant: NEGATIVE ug/mL
Oxycodone: NEGATIVE ng/mL
pH: 4.8 (ref 4.5–9.0)

## 2018-12-11 NOTE — Patient Instructions (Addendum)
YOU NEED TO HAVE A COVID 19 TEST ON_7/9______ @___12 :00____, THIS TEST MUST BE DONE BEFORE SURGERY, [facility]. ONCE YOUR COVID TEST IS COMPLETED, PLEASE BEGIN THE QUARANTINE INSTRUCTIONS AS OUTLINED IN YOUR HANDOUT.                Dakota ChurchGary W [facility]    Your procedure is scheduled on: 12-17-2018   [facility]  Report to admitting at 8:00 AM   BRING CPAP MASK AND TUBING   Call this number if you have problems the morning of surgery [phone]     Remember: BRUSH YOUR TEETH MORNING OF SURGERY AND RINSE YOUR MOUTH OUT, NO CHEWING GUM CANDY OR MINTS.   NO SOLID FOOD AFTER MIDNIGHT THE NIGHT PRIOR TO SURGERY.  NOTHING BY MOUTH EXCEPT CLEAR LIQUIDS UNTIL 7:30AM.  PLEASE FINISH ENSURE DRINK PER SURGEON ORDER 3 HOURS PRIOR TO SCHEDULED SURGERY TIME WHICH NEEDS TO BE COMPLETED AT 7:30 AM.   CLEAR LIQUID DIET   Foods Allowed                                                                     Foods Excluded  Coffee and tea, regular and decaf                             liquids that you cannot  Plain Jell-O in any flavor                                             see through such as: Fruit ices (not with fruit pulp)                                     milk, soups, orange juice  Iced Popsicles                                    All solid food Carbonated beverages, regular and diet                                    Cranberry, grape and apple juices Sports drinks like Gatorade Lightly seasoned clear broth or consume(fat free) Sugar, honey syrup  Sample Menu Breakfast                                Lunch                                     Supper Cranberry juice                    Beef broth  Chicken broth Jell-O                                     Grape juice                           Apple juice Coffee or tea                        Jell-O                                      Popsicle                                                 Coffee or tea                        Coffee or tea  _____________________________________________________________________     Take these medicines the morning of surgery with A SIP OF WATER: none                                You may not have any metal on your body including piercings             Do not wear jewelry, , lotions, powders or , deodorant                         Men may shave face and neck.   Do not bring valuables to the hospital. [place].  Contacts, dentures or bridgework may not be worn into surgery.       _____________________________________________________________________             [facility] - Preparing for Surgery Before surgery, you can play an important role.   Because skin is not sterile, your skin needs to be as free of germs as possible.   You can reduce the number of germs on your skin by washing with CHG (chlorahexidine gluconate) soap before surgery.   CHG is an antiseptic cleaner which kills germs and bonds with the skin to continue killing germs even after washing. Please DO NOT use if you have an allergy to CHG or antibacterial soaps.   If your skin becomes reddened/irritated stop using the CHG and inform your nurse when you arrive at Short Stay. .  You may shave your face/neck. Please follow these instructions carefully:  1.  Shower with CHG Soap the night before surgery and the  morning of Surgery.  2.  If you choose to wash your hair, wash your hair first as usual with your  normal  shampoo.  3.  After you shampoo, rinse your hair and body thoroughly to remove the  shampoo.  4.  Use CHG as you would any other liquid soap.  You can apply chg directly  to the skin and wash                       Gently with a scrungie or clean washcloth.  5.  Apply the CHG Soap to your body ONLY FROM THE NECK DOWN.   Do not use  on face/ open                           Wound or open sores. Avoid contact with eyes, ears mouth and genitals (private parts).                       Wash face,  Genitals (private parts) with your normal soap.             6.  Wash thoroughly, paying special attention to the area where your surgery  will be performed.  7.  Thoroughly rinse your body with warm water from the neck down.  8.  DO NOT shower/wash with your normal soap after using and rinsing off  the CHG Soap.                9.  Pat yourself dry with a clean towel.            10.  Wear clean pajamas.            11.  Place clean sheets on your bed the night of your first shower and do not  sleep with pets. Day of Surgery : Do not apply any lotions/deodorants the morning of surgery.  Please wear clean clothes to the hospital/surgery center.  FAILURE TO FOLLOW THESE INSTRUCTIONS MAY RESULT IN THE CANCELLATION OF YOUR SURGERY PATIENT SIGNATURE_________________________________  NURSE SIGNATURE__________________________________  ________________________________________________________________________   Dakota Ellison  An incentive spirometer is a tool that can help keep your lungs clear and active. This tool measures how well you are filling your lungs with each breath. Taking long deep breaths may help reverse or decrease the chance of developing breathing (pulmonary) problems (especially infection) following:  A long period of time when you are unable to move or be active. BEFORE THE PROCEDURE   If the spirometer includes an indicator to show your best effort, your nurse or respiratory therapist will set it to a desired goal.  If possible, sit up straight or lean slightly forward. Try not to slouch.  Hold the incentive spirometer in an upright position. INSTRUCTIONS FOR USE  1. Sit on the edge of your bed if possible, or sit up as far as you can in bed or on a chair. 2. Hold the incentive spirometer in an upright  position. 3. Breathe out normally. 4. Place the mouthpiece in your mouth and seal your lips tightly around it. 5. Breathe in slowly and as deeply as possible, raising the piston or the ball toward the top of the column. 6. Hold your breath for 3-5 seconds or for as long as possible. Allow the piston or ball to fall to the bottom of the column. 7. Remove the mouthpiece from your mouth and breathe out normally. 8. Rest for a few seconds and repeat Steps 1 through 7 at least 10 times every 1-2 hours when you are awake. Take your time and take a few normal breaths between deep breaths. 9. The spirometer may include an indicator to  show your best effort. Use the indicator as a goal to work toward during each repetition. 10. After each set of 10 deep breaths, practice coughing to be sure your lungs are clear. If you have an incision (the cut made at the time of surgery), support your incision when coughing by placing a pillow or rolled up towels firmly against it. Once you are able to get out of bed, walk around indoors and cough well. You may stop using the incentive spirometer when instructed by your caregiver.  RISKS AND COMPLICATIONS  Take your time so you do not get dizzy or light-headed.  If you are in pain, you may need to take or ask for pain medication before doing incentive spirometry. It is harder to take a deep breath if you are having pain. AFTER USE  Rest and breathe slowly and easily.  It can be helpful to keep track of a log of your progress. Your caregiver can provide you with a simple table to help with this. If you are using the spirometer at home, follow these instructions: [place] IF:   You are having difficultly using the spirometer.  You have trouble using the spirometer as often as instructed.  Your pain medication is not giving enough relief while using the spirometer.  You develop fever of 100.5 F (38.1 C) or higher. SEEK IMMEDIATE MEDICAL CARE IF:    You cough up bloody sputum that had not been present before.  You develop fever of 102 F (38.9 C) or greater.  You develop worsening pain at or near the incision site. MAKE SURE YOU:   Understand these instructions.  Will watch your condition.  Will get help right away if you are not doing well or get worse. Document Released: 10/03/2006 Document Revised: 08/15/2011 Document Reviewed: 12/04/2006 Exit[employer] Patient Information 2014 Exit[employer], [place].   ________________________________________________________________________  WHAT IS A BLOOD TRANSFUSION? Blood Transfusion Information  A transfusion is the replacement of blood or some of its parts. Blood is made up of multiple cells which provide different functions.  Red blood cells carry oxygen and are used for blood loss replacement.  White blood cells fight against infection.  Platelets control bleeding.  Plasma helps clot blood.  Other blood products are available for specialized needs, such as hemophilia or other clotting disorders. BEFORE THE TRANSFUSION  Who gives blood for transfusions?   Healthy volunteers who are fully evaluated to make sure their blood is safe. This is blood bank blood. Transfusion therapy is the safest it has ever been in the practice of medicine. Before blood is taken from a donor, a complete history is taken to make sure that person has no history of diseases nor engages in risky social behavior (examples are intravenous drug use or sexual activity with multiple partners). The donor's travel history is screened to minimize risk of transmitting infections, such as malaria. The donated blood is tested for signs of infectious diseases, such as HIV and hepatitis. The blood is then tested to be sure it is compatible with you in order to minimize the chance of a transfusion reaction. If you or a relative donates blood, this is often done in anticipation of surgery and is not appropriate for emergency  situations. It takes many days to process the donated blood. RISKS AND COMPLICATIONS Although transfusion therapy is very safe and saves many lives, the main dangers of transfusion include:   Getting an infectious disease.  Developing a transfusion reaction. This is an allergic reaction  to something in the blood you were given. Every precaution is taken to prevent this. The decision to have a blood transfusion has been considered carefully by your caregiver before blood is given. Blood is not given unless the benefits outweigh the risks. AFTER THE TRANSFUSION  Right after receiving a blood transfusion, you will usually feel much better and more energetic. This is especially true if your red blood cells have gotten low (anemic). The transfusion raises the level of the red blood cells which carry oxygen, and this usually causes an energy increase.  The nurse administering the transfusion will monitor you carefully for complications. HOME CARE INSTRUCTIONS  No special instructions are needed after a transfusion. You may find your energy is better. Speak with your caregiver about any limitations on activity for underlying diseases you may have. SEEK MEDICAL CARE IF:   Your condition is not improving after your transfusion.  You develop redness or irritation at the intravenous (IV) site. SEEK IMMEDIATE MEDICAL CARE IF:  Any of the following symptoms occur over the next 12 hours:  Shaking chills.  You have a temperature by mouth above 102 F (38.9 C), not controlled by medicine.  Chest, back, or muscle pain.  People around you feel you are not acting correctly or are confused.  Shortness of breath or difficulty breathing.  Dizziness and fainting.  You get a rash or develop hives.  You have a decrease in urine output.  Your urine turns a dark color or changes to pink, red, or brown. Any of the following symptoms occur over the next 10 days:  You have a temperature by mouth above  102 F (38.9 C), not controlled by medicine.  Shortness of breath.  Weakness after normal activity.  The white part of the eye turns yellow (jaundice).  You have a decrease in the amount of urine or are urinating less often.  Your urine turns a dark color or changes to pink, red, or brown. Document Released: 05/20/2000 Document Revised: 08/15/2011 Document Reviewed: 01/07/2008 [facility] Patient Information 2014 [place], [place].  _______________________________________________________________________

## 2018-12-11 NOTE — Progress Notes (Addendum)
EKG 12-05-18 Epic LOV DR GALLEN CARDIOLOGY 04-02-18 Epic CT CARDIAC SCORING 04-05-18 Epic DR YOUNG CPAP NOTE 12-05-18 EPIC

## 2018-12-13 ENCOUNTER — Encounter (HOSPITAL_COMMUNITY)
Admission: RE | Admit: 2018-12-13 | Discharge: 2018-12-13 | Disposition: A | Discharge status: Home / Self Care | Payer: PPO | Source: Ambulatory Visit | Attending: Orthopedic Surgery | Admitting: Orthopedic Surgery

## 2018-12-13 ENCOUNTER — Other Ambulatory Visit (HOSPITAL_COMMUNITY)
Admission: RE | Admit: 2018-12-13 | Discharge: 2018-12-13 | Disposition: A | Discharge status: Home / Self Care | Payer: PPO | Source: Ambulatory Visit | Attending: Orthopedic Surgery | Admitting: Orthopedic Surgery

## 2018-12-13 ENCOUNTER — Other Ambulatory Visit: Payer: Self-pay

## 2018-12-13 ENCOUNTER — Encounter (HOSPITAL_COMMUNITY): Payer: Self-pay

## 2018-12-13 DIAGNOSIS — I10 Essential (primary) hypertension: Secondary | ICD-10-CM | POA: Diagnosis not present

## 2018-12-13 DIAGNOSIS — M1711 Unilateral primary osteoarthritis, right knee: Secondary | ICD-10-CM | POA: Insufficient documentation

## 2018-12-13 DIAGNOSIS — Z79899 Other long term (current) drug therapy: Secondary | ICD-10-CM | POA: Insufficient documentation

## 2018-12-13 DIAGNOSIS — K219 Gastro-esophageal reflux disease without esophagitis: Secondary | ICD-10-CM | POA: Diagnosis not present

## 2018-12-13 DIAGNOSIS — Z96652 Presence of left artificial knee joint: Secondary | ICD-10-CM | POA: Insufficient documentation

## 2018-12-13 DIAGNOSIS — Z01818 Encounter for other preprocedural examination: Secondary | ICD-10-CM | POA: Diagnosis not present

## 2018-12-13 DIAGNOSIS — G4733 Obstructive sleep apnea (adult) (pediatric): Secondary | ICD-10-CM | POA: Insufficient documentation

## 2018-12-13 DIAGNOSIS — E785 Hyperlipidemia, unspecified: Secondary | ICD-10-CM | POA: Insufficient documentation

## 2018-12-13 DIAGNOSIS — Z1159 Encounter for screening for other viral diseases: Secondary | ICD-10-CM | POA: Insufficient documentation

## 2018-12-13 DIAGNOSIS — Z87891 Personal history of nicotine dependence: Secondary | ICD-10-CM | POA: Insufficient documentation

## 2018-12-13 DIAGNOSIS — Z7982 Long term (current) use of aspirin: Secondary | ICD-10-CM | POA: Insufficient documentation

## 2018-12-13 LAB — COMPREHENSIVE METABOLIC PANEL
ALT: 28 U/L (ref 0–44)
AST: 28 U/L (ref 15–41)
Albumin: 4.6 g/dL (ref 3.5–5.0)
Alkaline Phosphatase: 81 U/L (ref 38–126)
Anion gap: 11 (ref 5–15)
BUN: 21 mg/dL (ref 8–23)
CO2: 23 mmol/L (ref 22–32)
Calcium: 9.7 mg/dL (ref 8.9–10.3)
Chloride: 105 mmol/L (ref 98–111)
Creatinine, Ser: 1.16 mg/dL (ref 0.61–1.24)
GFR calc Af Amer: >60 mL/min (ref >60)
GFR calc non Af Amer: >60 mL/min (ref >60)
Glucose, Bld: 99 mg/dL (ref 70–99)
Potassium: 4.2 mmol/L (ref 3.5–5.1)
Sodium: 139 mmol/L (ref 135–145)
Total Bilirubin: 0.8 mg/dL (ref 0.3–1.2)
Total Protein: 8.4 g/dL — ABNORMAL HIGH (ref 6.5–8.1)

## 2018-12-13 LAB — CBC
HCT: 48.1 % (ref 39.0–52.0)
Hemoglobin: 16.2 g/dL (ref 13.0–17.0)
MCH: 28.6 pg (ref 26.0–34.0)
MCHC: 33.7 g/dL (ref 30.0–36.0)
MCV: 84.8 fL (ref 80.0–100.0)
Platelets: 277 10*3/uL (ref 150–400)
RBC: 5.67 MIL/uL (ref 4.22–5.81)
RDW: 13.8 % (ref 11.5–15.5)
WBC: 9.9 10*3/uL (ref 4.0–10.5)
nRBC: 0 % (ref 0.0–0.2)

## 2018-12-13 LAB — APTT: aPTT: 31 seconds (ref 24–36)

## 2018-12-13 LAB — PROTIME-INR
INR: 1 (ref 0.8–1.2)
Prothrombin Time: 12.9 seconds (ref 11.4–15.2)

## 2018-12-13 LAB — SURGICAL PCR SCREEN
MRSA, PCR: NEGATIVE
Staphylococcus aureus: NEGATIVE

## 2018-12-13 NOTE — Progress Notes (Signed)
Candace Gallus PA  Patient stopped his ASA 12/09/18 per his cardiologist Dr. Gilmer Mor.

## 2018-12-14 LAB — SARS CORONAVIRUS 2 (TAT 6-24 HRS): SARS Coronavirus 2: NEGATIVE

## 2018-12-14 NOTE — Progress Notes (Signed)
Anesthesia Chart Review   Case: [id number] Date/Time: 12/17/18 1015   Procedure: TOTAL KNEE ARTHROPLASTY (Right ) - 50min   Anesthesia type: Choice   Pre-op diagnosis: right knee ostearthritis   Location: [facility] ROOM 10 / [facility]   Surgeon: Ollen GrossAluisio, Frank, MD      DISCUSSION:68 y.o. former smoker (quit 06/07/11) with h/o PONV, HTN, OSA, hyperlipemia, GERD, right knee OA scheduled for above procedure 12/17/2018 with Dr. Ollen GrossFrank Aluisio.  Pt seen by PCP, Dr. Tillman Abideichard Letvak, 12/05/2018 for preoperative evaluation.  Per OV note, "No concerns on exam or his history.  Did very well with TKR in January EKG shows sinus with 2 PACs/1 PVC. Low voltage in inferior leads with non specific T changes. Since 04/02/18, the low voltage is new and the T changes are more like EKG in 2018. I do not think these are a problem.  Okay to proceed with surgery as planned.  I would recommend 2 weeks of DVT prophylaxis.  Okay for Dr Despina HickAlusio to give 5 days of narcotics post op."  Cardiac clearance received 11/05/2018.  Per Ronie Spiesayna Dunn, PA-C, "Dakota Ellison was last seen on 04/02/18 by Dr. Mariah MillingGollan. He has h/o OSA, morbid obesity, HTN, HLD, asthma, GERD, OA. Had cardiac CT scoring with calcium score of zero in 03/2018, also with remote normal stress test. At his visit Dr. Mariah MillingGollan cleared him to proceed with knee surgery. This is a different knee. RCRI is 0.4% indicating low risk of CV complications. I spoke with patient who affirms he is doing well without any new cardiac symptoms. He is able to achieve >4 METS without angina. He does a lot of yardwork, mowing and bushwacking. Therefore, based on ACC/AHA guidelines, the patient would be at acceptable risk for the planned procedure without further cardiovascular testing. He does not appear to be on any antiplatelet medication by our records."  Anticipate pt can proceed with planned procedure barring acute status change.   VS: BP (!) 151/92   Pulse 95   Temp 36.8 C (Oral)   Resp 18   Ht 6'  (1.829 m)   Wt 125.4 kg   SpO2 98%   BMI 37.49 kg/m   PROVIDERS: Karie SchwalbeLetvak, Richard I, MD is PCP   Julien NordmannGollan, Timothy, MD is Cardiologist  LABS: Labs reviewed: Acceptable for surgery. (all labs ordered are listed, but only abnormal results are displayed)  Labs Reviewed  COMPREHENSIVE METABOLIC PANEL - Abnormal; Notable for the following components:      Result Value   Total Protein 8.4 (*)    All other components within normal limits  SURGICAL PCR SCREEN  APTT  CBC  PROTIME-INR  TYPE AND SCREEN     IMAGES:   EKG: 12/05/2018 Rate 72 bpm sinus with 2 PACs/1 PVC. Low voltage in inferior leads with non specific T changes  CV:  Past Medical History:  Diagnosis Date  . Anxiety state 04/08/2014   denies  . Complication of anesthesia    and trouble waking up  . Degenerative disc disease   . Dysthymia    denies  . ED (erectile dysfunction)   . GERD (gastroesophageal reflux disease)   . History of MRSA infection    on lip from cut  . Hyperlipemia   . Hypertension    take BP med for small leak in heart to keep it small per pt.  . Mild asthma    as a teen  . Morbid obesity (HCC)   . OSA (obstructive sleep apnea)   .  Osteoarthritis   . PONV (postoperative nausea and vomiting)   . Rectal fissure   . Testosterone deficiency     Past Surgical History:  Procedure Laterality Date  . HERNIA REPAIR     1998  . KNEE SURGERY  09/2010   left knee and right knee arthroscopy  . OTHER SURGICAL HISTORY     arthroscopic subacromial decompression  . OTHER SURGICAL HISTORY     open resection, distal right clavicle  . SHOULDER ARTHROSCOPY     right  . SPHINCTEROTOMY    . TOTAL KNEE ARTHROPLASTY Left 06/11/2018   Procedure: LEFT TOTAL KNEE ARTHROPLASTY;  Surgeon: Gaynelle Arabian, MD;  Location: [facility];  Service: Orthopedics;  Laterality: Left;  59min    MEDICATIONS: . aspirin EC 81 MG tablet  . cetirizine (ZYRTEC) 10 MG tablet  . Turmeric (QC TUMERIC COMPLEX PO)  . ALPRAZolam  (XANAX) 0.5 MG tablet  . amLODIPine-Valsartan-HCTZ 10-320-25 MG TABS  . Apple Cider Vinegar 500 MG TABS  . CINNAMON PO  . ezetimibe (ZETIA) 10 MG tablet  . Garlic 2263 MG CAPS  . Ginkgo Biloba 120 MG CAPS  . HYDROcodone-acetaminophen (NORCO/VICODIN) 5-325 MG tablet  . Multiple Vitamin (MULTIVITAMIN WITH MINERALS) TABS tablet  . niacin 500 MG CR capsule  . omeprazole (PRILOSEC) 20 MG capsule  . sertraline (ZOLOFT) 25 MG tablet  . sildenafil (VIAGRA) 100 MG tablet  . SYSTANE ULTRA 0.4-0.3 % SOLN  . testosterone cypionate (DEPOTESTOSTERONE CYPIONATE) 200 MG/ML injection   No current facility-administered medications for this encounter.      Maia Plan [facility] Pre-Surgical Testing [phone] 12/14/18  10:09 AM

## 2018-12-14 NOTE — H&P (Signed)
TOTAL KNEE ADMISSION H&P  Patient is being admitted for right total knee arthroplasty.  Subjective:  Chief Complaint:right knee pain.  HPI: Dakota MannGary W Doucette, 68 y.o. male, has a history of pain and functional disability in the right knee due to arthritis and has failed non-surgical conservative treatments for greater than 12 weeks to includeNSAID's and/or analgesics, corticosteriod injections, viscosupplementation injections and activity modification.  Onset of symptoms was gradual, starting 5 years ago with gradually worsening course since that time. The patient noted prior procedures on the knee to include  arthroscopy and menisectomy on the right knee(s).  Patient currently rates pain in the right knee(s) at 8 out of 10 with activity. Patient has night pain, worsening of pain with activity and weight bearing, pain that interferes with activities of daily living, pain with passive range of motion, crepitus and joint swelling.  Patient has evidence of periarticular osteophytes and joint space narrowing by imaging studies. There is no active infection.  Patient Active Problem List   Diagnosis Date Noted  . Pre-op evaluation 12/05/2018  . Chronic narcotic dependence (HCC) 10/09/2018  . Low back pain 04/11/2015  . Wellness examination 10/07/2014  . Mood disorder (HCC) 10/07/2014  . Impaired glucose tolerance 10/04/2013  . Inguinal hernia unilateral, non-recurrent, left 10/17/2012  . Bladder neck obstruction 10/04/2010  . Obstructive sleep apnea on CPAP 10/06/2008  . RECTAL FISSURE 05/14/2008  . Testosterone deficiency 10/02/2007  . Hyperlipidemia 07/13/2007  . Morbid obesity (HCC) 07/13/2007  . DYSTHYMIA 07/13/2007  . ERECTILE DYSFUNCTION 07/13/2007  . Essential hypertension 07/13/2007  . Lumbar spinal stenosis 07/13/2007  . Asthma 07/12/2007  . GERD 07/12/2007  . OA (osteoarthritis) of knee 07/12/2007   Past Medical History:  Diagnosis Date  . Anxiety state 04/08/2014   denies  .  Complication of anesthesia    and trouble waking up  . Degenerative disc disease   . Dysthymia    denies  . ED (erectile dysfunction)   . GERD (gastroesophageal reflux disease)   . History of MRSA infection    on lip from cut  . Hyperlipemia   . Hypertension    take BP med for small leak in heart to keep it small per pt.  . Mild asthma    as a teen  . Morbid obesity (HCC)   . OSA (obstructive sleep apnea)   . Osteoarthritis   . PONV (postoperative nausea and vomiting)   . Rectal fissure   . Testosterone deficiency     Past Surgical History:  Procedure Laterality Date  . HERNIA REPAIR     1998  . KNEE SURGERY  09/2010   left knee and right knee arthroscopy  . OTHER SURGICAL HISTORY     arthroscopic subacromial decompression  . OTHER SURGICAL HISTORY     open resection, distal right clavicle  . SHOULDER ARTHROSCOPY     right  . SPHINCTEROTOMY    . TOTAL KNEE ARTHROPLASTY Left 06/11/2018   Procedure: LEFT TOTAL KNEE ARTHROPLASTY;  Surgeon: Ollen GrossAluisio, Frank, MD;  Location: [facility];  Service: Orthopedics;  Laterality: Left;  50min       Current Outpatient Medications  Medication Sig Dispense Refill Last Dose  . ALPRAZolam (XANAX) 0.5 MG tablet Take 1 tablet (0.5 mg total) by mouth 2 (two) times daily as needed. (Patient taking differently: Take 0.5 mg by mouth 2 (two) times daily as needed for anxiety. ) 60 tablet 0   . amLODIPine-Valsartan-HCTZ 10-320-25 MG TABS Take 1 tablet by mouth daily. 90  tablet 3   . Apple Cider Vinegar 500 MG TABS Take 500 mg by mouth daily.      Marland Kitchen CINNAMON PO Take 200 mg by mouth daily.     Marland Kitchen ezetimibe (ZETIA) 10 MG tablet Take 1 tablet (10 mg total) by mouth daily. (Patient taking differently: Take 10 mg by mouth every evening. ) 90 tablet 3   . Garlic 0998 MG CAPS Take 2,000 mg by mouth 2 (two) times daily.      . Ginkgo Biloba 120 MG CAPS Take 120 mg by mouth daily.      Marland Kitchen HYDROcodone-acetaminophen (NORCO/VICODIN) 5-325 MG tablet Take 1 tablet by  mouth 3 (three) times daily. 90 tablet 0   . Multiple Vitamin (MULTIVITAMIN WITH MINERALS) TABS tablet Take 1 tablet by mouth daily.     . niacin 500 MG CR capsule Take 500 mg by mouth 2 (two) times daily.      Marland Kitchen omeprazole (PRILOSEC) 20 MG capsule Take 1 capsule (20 mg total) by mouth daily. (Patient taking differently: Take 20 mg by mouth every evening. ) 90 capsule 3   . sertraline (ZOLOFT) 25 MG tablet TAKE 1 TABLET (25 MG TOTAL) BY MOUTH DAILY. (Patient taking differently: Take 25 mg by mouth every evening. TAKE 1 TABLET (25 MG TOTAL) BY MOUTH DAILY.) 90 tablet 3   . sildenafil (VIAGRA) 100 MG tablet Take as directed (Patient taking differently: Take 100 mg by mouth daily as needed for erectile dysfunction. Take as directed) 10 tablet 5   . SYSTANE ULTRA 0.4-0.3 % SOLN Place 1 drop into both eyes 4 (four) times daily as needed (dry/irritated eyes.).     Marland Kitchen testosterone cypionate (DEPOTESTOSTERONE CYPIONATE) 200 MG/ML injection Inject 200 mg into the muscle every 14 (fourteen) days. Saturdays     . aspirin EC 81 MG tablet Take 81 mg by mouth daily.     . cetirizine (ZYRTEC) 10 MG tablet Take 10 mg by mouth daily.     . Turmeric (QC TUMERIC COMPLEX PO) Take 1 tablet by mouth daily.      Allergies  Allergen Reactions  . Atorvastatin Other (See Comments)    Muscle weakness in his legs  . Crestor [Rosuvastatin] Other (See Comments)    Severe muscle cramping  . Zocor [Simvastatin] Other (See Comments)    Muscle weakness in his legs    Social History   Tobacco Use  . Smoking status: Former Smoker    Types: Cigars    Quit date: 06/07/2011    Years since quitting: 7.5  . Smokeless tobacco: Never Used  . Tobacco comment: CIGARS ONLY--QUIT 2013  Substance Use Topics  . Alcohol use: Yes    Alcohol/week: 0.0 standard drinks    Comment: rare//occassional    Family History  Problem Relation Age of Onset  . Pneumonia Brother      Review of Systems  Constitutional: Negative.   HENT:  Negative.   Eyes: Negative.   Respiratory: Negative.   Cardiovascular: Negative.   Gastrointestinal: Negative.   Genitourinary: Negative.   Musculoskeletal: Positive for back pain, joint pain and myalgias. Negative for falls and neck pain.  Skin: Negative.   Neurological: Negative.   Endo/Heme/Allergies: Negative.   Psychiatric/Behavioral: Negative.     Objective:  Physical Exam  Constitutional: He is oriented to person, place, and time. He appears well-developed. No distress.  Morbidly obese  HENT:  Head: Normocephalic and atraumatic.  Right Ear: External ear normal.  Left Ear: External ear normal.  Nose: Nose normal.  Mouth/Throat: Oropharynx is clear and moist.  Eyes: Conjunctivae and EOM are normal.  Neck: Normal range of motion. Neck supple.  Cardiovascular: Normal rate, regular rhythm, normal heart sounds and intact distal pulses.  No murmur heard. Respiratory: Effort normal and breath sounds normal. No respiratory distress. He has no wheezes.  GI: Soft. Bowel sounds are normal. He exhibits no distension. There is no abdominal tenderness.  Musculoskeletal:     Comments: ROM: Normal without discomfort. There is no tenderness over the greater trochanter bursa. There is no pain on provocative testing of the hip.  Right Knee Exam: Large effusion present. Range of motion is: 0 to 100 degrees - limited by the effusion. No crepitus on range of motion of the knee. Positive medial joint line tenderness. No lateral joint line tenderness. Positive Lachman test. Slight AP laxity is present. No medial or lateral instability.  Neurological: He is alert and oriented to person, place, and time. He has normal strength. No sensory deficit.  Skin: Skin is warm and dry. No rash noted. He is not diaphoretic. No erythema.  Psychiatric: He has a normal mood and affect. His behavior is normal.    Vital signs in last 24 hours: Temp:  [98.3 F (36.8 C)] 98.3 F (36.8 C) (07/09  1100) Pulse Rate:  [95] 95 (07/09 1100) Resp:  [18] 18 (07/09 1100) BP: (151)/(92) 151/92 (07/09 1100) SpO2:  [98 %] 98 % (07/09 1100) Weight:  [125.4 kg] 125.4 kg (07/09 1100)  Labs:  Estimated body mass index is 37.49 kg/m as calculated from the following:   Height as of 12/13/18: 6' (1.829 m).   Weight as of 12/13/18: 125.4 kg.   Imaging Review Plain radiographs demonstrate severe degenerative joint disease of the right knee(s). The overall alignment ismild varus. The bone quality appears to be good for age and reported activity level.    Assessment/Plan:  End stage primary osteoarthritis, right knee   The patient history, physical examination, clinical judgment of the provider and imaging studies are consistent with end stage degenerative joint disease of the right knee(s) and total knee arthroplasty is deemed medically necessary. The treatment options including medical management, injection therapy arthroscopy and arthroplasty were discussed at length. The risks and benefits of total knee arthroplasty were presented and reviewed. The risks due to aseptic loosening, infection, stiffness, patella tracking problems, thromboembolic complications and other imponderables were discussed. The patient acknowledged the explanation, agreed to proceed with the plan and consent was signed. Patient is being admitted for inpatient treatment for surgery, pain control, PT, OT, prophylactic antibiotics, VTE prophylaxis, progressive ambulation and ADL's and discharge planning. The patient is planning to be discharged home with outpatient therapy at [facility].    Anticipated LOS equal to or greater than 2 midnights due to - Age 67 and older with one or more of the following:  - Obesity  - Expected need for hospital services (PT, OT, Nursing) required for safe  discharge  - Anticipated need for postoperative skilled nursing care or inpatient rehab  - Active co-morbidities: Chronic pain requiring  opiods OR   - Unanticipated findings during/Post Surgery: None  - Patient is a high risk of re-admission due to: None     Risks and benefits of the surgery were discussed with the patient and Dr.Aluisio at their previous office visit, and the patient has elected to move forward with the aforementioned surgery. Post-operative care plans were discussed with the patient today.  Therapy Plans: outpatient therapy  at [facility] Disposition: Home with friend Planned DVT prophylaxis: aspirin 325mg  BID DME needed: has equipment PCP: Dr. Demaris CallanderLetvack Cardio: Dr. Mariah MillingGollan  Other: No foley catheter per Dr. Lequita HaltAluisio No anesthesia concerns  Instructed on which meds to stop prior to surgery  Questions and concerns were welcomed and addressed. If they have any further questions or concerns that arise, they are instructed to call the office at any time.  Dimitri PedAmber Genavieve Mangiapane, PA-C

## 2018-12-14 NOTE — Anesthesia Preprocedure Evaluation (Addendum)
Anesthesia Evaluation  Patient identified by MRN, date of birth, ID band Patient awake    Reviewed: Allergy & Precautions, NPO status , Patient's Chart, lab work & pertinent test results  History of Anesthesia Complications (+) PONV and history of anesthetic complications  Airway Mallampati: II  TM Distance: >3 FB Neck ROM: Full    Dental  (+) Dental Advisory Given, Caps,  Bottom permanent bridge :   Pulmonary asthma , sleep apnea and Continuous Positive Airway Pressure Ventilation , former smoker,    Pulmonary exam normal breath sounds clear to auscultation       Cardiovascular hypertension, Pt. on medications (-) angina(-) CAD, (-) Past MI and (-) CHF Normal cardiovascular exam Rhythm:Regular Rate:Normal     Neuro/Psych PSYCHIATRIC DISORDERS Anxiety Depression negative neurological ROS     GI/Hepatic Neg liver ROS, GERD  Medicated,  Endo/Other  Obesity   Renal/GU negative Renal ROS     Musculoskeletal  (+) Arthritis , Osteoarthritis,    Abdominal   Peds  Hematology negative hematology ROS (+) Plt 277k   Anesthesia Other Findings Day of surgery medications reviewed with the patient.  Reproductive/Obstetrics                          Anesthesia Physical Anesthesia Plan  ASA: II  Anesthesia Plan: Spinal   Post-op Pain Management:  Regional for Post-op pain   Induction: Intravenous  PONV Risk Score and Plan: 2 and Propofol infusion and Treatment may vary due to age or medical condition  Airway Management Planned: Natural Airway and Nasal Cannula  Additional Equipment:   Intra-op Plan:   Post-operative Plan:   Informed Consent: I have reviewed the patients History and Physical, chart, labs and discussed the procedure including the risks, benefits and alternatives for the proposed anesthesia with the patient or authorized representative who has indicated his/her understanding and  acceptance.     Dental advisory given  Plan Discussed with: CRNA, Anesthesiologist and Surgeon  Anesthesia Plan Comments:       Anesthesia Quick Evaluation

## 2018-12-16 MED ORDER — BUPIVACAINE LIPOSOME 1.3 % IJ SUSP
20.0000 mL | INTRAMUSCULAR | Status: DC
  Filled 2018-12-16: qty 20

## 2018-12-16 MED ORDER — DEXTROSE 5 % IV SOLN
3.0000 g | INTRAVENOUS | Status: AC
  Administered 2018-12-17: 3 g via INTRAVENOUS
  Filled 2018-12-16: qty 3

## 2018-12-17 ENCOUNTER — Encounter (HOSPITAL_COMMUNITY): Payer: Self-pay | Admitting: *Deleted

## 2018-12-17 ENCOUNTER — Inpatient Hospital Stay (HOSPITAL_COMMUNITY)
Admission: RE | Admit: 2018-12-17 | Discharge: 2018-12-18 | DRG: 470 | Disposition: A | Discharge status: Home / Self Care | Payer: PPO | Attending: Orthopedic Surgery | Admitting: Orthopedic Surgery

## 2018-12-17 ENCOUNTER — Inpatient Hospital Stay (HOSPITAL_COMMUNITY): Payer: PPO | Admitting: Anesthesiology

## 2018-12-17 ENCOUNTER — Inpatient Hospital Stay (HOSPITAL_COMMUNITY): Payer: PPO | Admitting: Physician Assistant

## 2018-12-17 ENCOUNTER — Other Ambulatory Visit: Payer: Self-pay

## 2018-12-17 ENCOUNTER — Encounter (HOSPITAL_COMMUNITY)
Admission: RE | Disposition: A | Discharge status: Home / Self Care | Payer: Self-pay | Source: Home / Self Care | Attending: Orthopedic Surgery

## 2018-12-17 DIAGNOSIS — J45909 Unspecified asthma, uncomplicated: Secondary | ICD-10-CM | POA: Diagnosis not present

## 2018-12-17 DIAGNOSIS — Z79899 Other long term (current) drug therapy: Secondary | ICD-10-CM

## 2018-12-17 DIAGNOSIS — I10 Essential (primary) hypertension: Secondary | ICD-10-CM | POA: Diagnosis present

## 2018-12-17 DIAGNOSIS — F411 Generalized anxiety disorder: Secondary | ICD-10-CM | POA: Diagnosis not present

## 2018-12-17 DIAGNOSIS — Z7982 Long term (current) use of aspirin: Secondary | ICD-10-CM | POA: Diagnosis not present

## 2018-12-17 DIAGNOSIS — M1711 Unilateral primary osteoarthritis, right knee: Principal | ICD-10-CM | POA: Diagnosis present

## 2018-12-17 DIAGNOSIS — Z888 Allergy status to other drugs, medicaments and biological substances status: Secondary | ICD-10-CM

## 2018-12-17 DIAGNOSIS — Z96652 Presence of left artificial knee joint: Secondary | ICD-10-CM | POA: Diagnosis present

## 2018-12-17 DIAGNOSIS — G8918 Other acute postprocedural pain: Secondary | ICD-10-CM | POA: Diagnosis not present

## 2018-12-17 DIAGNOSIS — Z87891 Personal history of nicotine dependence: Secondary | ICD-10-CM

## 2018-12-17 DIAGNOSIS — Z1159 Encounter for screening for other viral diseases: Secondary | ICD-10-CM | POA: Diagnosis not present

## 2018-12-17 DIAGNOSIS — K219 Gastro-esophageal reflux disease without esophagitis: Secondary | ICD-10-CM | POA: Diagnosis present

## 2018-12-17 DIAGNOSIS — M25761 Osteophyte, right knee: Secondary | ICD-10-CM | POA: Diagnosis not present

## 2018-12-17 DIAGNOSIS — M48061 Spinal stenosis, lumbar region without neurogenic claudication: Secondary | ICD-10-CM | POA: Diagnosis present

## 2018-12-17 DIAGNOSIS — G4733 Obstructive sleep apnea (adult) (pediatric): Secondary | ICD-10-CM | POA: Diagnosis not present

## 2018-12-17 DIAGNOSIS — E785 Hyperlipidemia, unspecified: Secondary | ICD-10-CM | POA: Diagnosis not present

## 2018-12-17 DIAGNOSIS — Z6837 Body mass index (BMI) 37.0-37.9, adult: Secondary | ICD-10-CM | POA: Diagnosis not present

## 2018-12-17 DIAGNOSIS — Z8614 Personal history of Methicillin resistant Staphylococcus aureus infection: Secondary | ICD-10-CM | POA: Diagnosis not present

## 2018-12-17 DIAGNOSIS — Z79891 Long term (current) use of opiate analgesic: Secondary | ICD-10-CM

## 2018-12-17 HISTORY — PX: TOTAL KNEE ARTHROPLASTY: SHX125

## 2018-12-17 LAB — TYPE AND SCREEN
ABO/RH(D): A NEG
Antibody Screen: NEGATIVE

## 2018-12-17 SURGERY — ARTHROPLASTY, KNEE, TOTAL
Anesthesia: Spinal | Site: Knee | Laterality: Right

## 2018-12-17 MED ORDER — EZETIMIBE 10 MG PO TABS
10.0000 mg | ORAL_TABLET | Freq: Every evening | ORAL | Status: DC
  Administered 2018-12-17: 18:00:00 10 mg via ORAL
  Filled 2018-12-17: qty 1

## 2018-12-17 MED ORDER — FLEET ENEMA 7-19 GM/118ML RE ENEM: 1.0000 | ENEMA | Freq: Once | RECTAL | Status: DC | PRN

## 2018-12-17 MED ORDER — PHENOL 1.4 % MT LIQD
1.0000 | OROMUCOSAL | Status: DC | PRN
  Filled 2018-12-17: qty 177

## 2018-12-17 MED ORDER — AMLODIPINE BESYLATE 10 MG PO TABS
10.0000 mg | ORAL_TABLET | Freq: Every day | ORAL | Status: DC
  Administered 2018-12-17 – 2018-12-18 (×2): 10 mg via ORAL
  Filled 2018-12-17 (×2): qty 1

## 2018-12-17 MED ORDER — CHLORHEXIDINE GLUCONATE 4 % EX LIQD: 60.0000 mL | Freq: Once | CUTANEOUS | Status: DC

## 2018-12-17 MED ORDER — PROPOFOL 10 MG/ML IV BOLUS
INTRAVENOUS | Status: AC
  Filled 2018-12-17: qty 20

## 2018-12-17 MED ORDER — ACETAMINOPHEN 500 MG PO TABS
1000.0000 mg | ORAL_TABLET | Freq: Four times a day (QID) | ORAL | Status: DC
  Administered 2018-12-17 – 2018-12-18 (×3): 1000 mg via ORAL
  Filled 2018-12-17 (×3): qty 2

## 2018-12-17 MED ORDER — AMLODIPINE-VALSARTAN-HCTZ 10-320-25 MG PO TABS: 1.0000 | ORAL_TABLET | Freq: Every day | ORAL | Status: DC

## 2018-12-17 MED ORDER — CEFAZOLIN SODIUM-DEXTROSE 2-4 GM/100ML-% IV SOLN
2.0000 g | Freq: Four times a day (QID) | INTRAVENOUS | Status: AC
  Administered 2018-12-17 (×2): 2 g via INTRAVENOUS
  Filled 2018-12-17 (×2): qty 100

## 2018-12-17 MED ORDER — SODIUM CHLORIDE 0.9 % IR SOLN
Status: DC | PRN
  Administered 2018-12-17: 1000 mL

## 2018-12-17 MED ORDER — MENTHOL 3 MG MT LOZG: 1.0000 | LOZENGE | OROMUCOSAL | Status: DC | PRN

## 2018-12-17 MED ORDER — PANTOPRAZOLE SODIUM 40 MG PO TBEC
40.0000 mg | DELAYED_RELEASE_TABLET | Freq: Every day | ORAL | Status: DC
  Administered 2018-12-17 – 2018-12-18 (×2): 40 mg via ORAL
  Filled 2018-12-17 (×2): qty 1

## 2018-12-17 MED ORDER — HYPROMELLOSE (GONIOSCOPIC) 2.5 % OP SOLN: 1.0000 [drp] | Freq: Once | OPHTHALMIC | Status: DC

## 2018-12-17 MED ORDER — IRBESARTAN 150 MG PO TABS
300.0000 mg | ORAL_TABLET | Freq: Every day | ORAL | Status: DC
  Administered 2018-12-17 – 2018-12-18 (×2): 300 mg via ORAL
  Filled 2018-12-17 (×2): qty 2

## 2018-12-17 MED ORDER — BUPIVACAINE IN DEXTROSE 0.75-8.25 % IT SOLN
INTRATHECAL | Status: DC | PRN
  Administered 2018-12-17: 1.6 mL via INTRATHECAL

## 2018-12-17 MED ORDER — METHOCARBAMOL 500 MG IVPB - SIMPLE MED
500.0000 mg | Freq: Four times a day (QID) | INTRAVENOUS | Status: DC | PRN
  Filled 2018-12-17: qty 50

## 2018-12-17 MED ORDER — HYDROCHLOROTHIAZIDE 25 MG PO TABS
25.0000 mg | ORAL_TABLET | Freq: Every day | ORAL | Status: DC
  Administered 2018-12-17 – 2018-12-18 (×2): 25 mg via ORAL
  Filled 2018-12-17 (×2): qty 1

## 2018-12-17 MED ORDER — LIDOCAINE 2% (20 MG/ML) 5 ML SYRINGE
INTRAMUSCULAR | Status: AC
  Filled 2018-12-17: qty 5

## 2018-12-17 MED ORDER — SODIUM CHLORIDE (PF) 0.9 % IJ SOLN
INTRAMUSCULAR | Status: DC | PRN
  Administered 2018-12-17: 60 mL

## 2018-12-17 MED ORDER — POLYETHYL GLYCOL-PROPYL GLYCOL 0.4-0.3 % OP SOLN: 1.0000 [drp] | Freq: Four times a day (QID) | OPHTHALMIC | Status: DC | PRN

## 2018-12-17 MED ORDER — ONDANSETRON HCL 4 MG/2ML IJ SOLN: 4.0000 mg | Freq: Four times a day (QID) | INTRAMUSCULAR | Status: DC | PRN

## 2018-12-17 MED ORDER — PROPOFOL 500 MG/50ML IV EMUL
INTRAVENOUS | Status: DC | PRN
  Administered 2018-12-17: 75 ug/kg/min via INTRAVENOUS

## 2018-12-17 MED ORDER — ONDANSETRON HCL 4 MG/2ML IJ SOLN
INTRAMUSCULAR | Status: DC | PRN
  Administered 2018-12-17: 4 mg via INTRAVENOUS

## 2018-12-17 MED ORDER — MORPHINE SULFATE (PF) 2 MG/ML IV SOLN: 1.0000 mg | INTRAVENOUS | Status: DC | PRN

## 2018-12-17 MED ORDER — ONDANSETRON HCL 4 MG PO TABS: 4.0000 mg | ORAL_TABLET | Freq: Four times a day (QID) | ORAL | Status: DC | PRN

## 2018-12-17 MED ORDER — METOCLOPRAMIDE HCL 5 MG/ML IJ SOLN: 5.0000 mg | Freq: Three times a day (TID) | INTRAMUSCULAR | Status: DC | PRN

## 2018-12-17 MED ORDER — SODIUM CHLORIDE (PF) 0.9 % IJ SOLN
INTRAMUSCULAR | Status: AC
  Filled 2018-12-17: qty 10

## 2018-12-17 MED ORDER — EPHEDRINE SULFATE 50 MG/ML IJ SOLN
INTRAMUSCULAR | Status: DC | PRN
  Administered 2018-12-17 (×2): 5 mg via INTRAVENOUS

## 2018-12-17 MED ORDER — PROPOFOL 10 MG/ML IV BOLUS
INTRAVENOUS | Status: AC
  Filled 2018-12-17: qty 40

## 2018-12-17 MED ORDER — TRANEXAMIC ACID-NACL 1000-0.7 MG/100ML-% IV SOLN
1000.0000 mg | Freq: Once | INTRAVENOUS | Status: AC
  Administered 2018-12-17: 16:00:00 1000 mg via INTRAVENOUS
  Filled 2018-12-17: qty 100

## 2018-12-17 MED ORDER — MIDAZOLAM HCL 2 MG/2ML IJ SOLN
1.0000 mg | INTRAMUSCULAR | Status: DC
  Administered 2018-12-17: 10:00:00 2 mg via INTRAVENOUS
  Filled 2018-12-17: qty 2

## 2018-12-17 MED ORDER — SERTRALINE HCL 25 MG PO TABS
25.0000 mg | ORAL_TABLET | Freq: Every evening | ORAL | Status: DC
  Administered 2018-12-17: 25 mg via ORAL
  Filled 2018-12-17: qty 1

## 2018-12-17 MED ORDER — GABAPENTIN 300 MG PO CAPS
300.0000 mg | ORAL_CAPSULE | Freq: Three times a day (TID) | ORAL | Status: DC
  Administered 2018-12-17 – 2018-12-18 (×3): 300 mg via ORAL
  Filled 2018-12-17 (×3): qty 1

## 2018-12-17 MED ORDER — SODIUM CHLORIDE (PF) 0.9 % IJ SOLN
INTRAMUSCULAR | Status: AC
  Filled 2018-12-17: qty 50

## 2018-12-17 MED ORDER — DEXAMETHASONE SODIUM PHOSPHATE 10 MG/ML IJ SOLN: 8.0000 mg | Freq: Once | INTRAMUSCULAR | Status: DC

## 2018-12-17 MED ORDER — EPHEDRINE 5 MG/ML INJ
INTRAVENOUS | Status: AC
  Filled 2018-12-17: qty 10

## 2018-12-17 MED ORDER — ALPRAZOLAM 0.5 MG PO TABS: 0.5000 mg | ORAL_TABLET | Freq: Two times a day (BID) | ORAL | Status: DC | PRN

## 2018-12-17 MED ORDER — POLYETHYLENE GLYCOL 3350 17 G PO PACK: 17.0000 g | PACK | Freq: Every day | ORAL | Status: DC | PRN

## 2018-12-17 MED ORDER — POLYVINYL ALCOHOL 1.4 % OP SOLN
1.0000 [drp] | Freq: Once | OPHTHALMIC | Status: AC
  Administered 2018-12-17: 10:00:00 1 [drp] via OPHTHALMIC
  Filled 2018-12-17: qty 15

## 2018-12-17 MED ORDER — METOCLOPRAMIDE HCL 5 MG PO TABS: 5.0000 mg | ORAL_TABLET | Freq: Three times a day (TID) | ORAL | Status: DC | PRN

## 2018-12-17 MED ORDER — BISACODYL 10 MG RE SUPP: 10.0000 mg | Freq: Every day | RECTAL | Status: DC | PRN

## 2018-12-17 MED ORDER — PROPOFOL 10 MG/ML IV BOLUS
INTRAVENOUS | Status: DC | PRN
  Administered 2018-12-17 (×2): 20 mg via INTRAVENOUS
  Administered 2018-12-17: 10 mg via INTRAVENOUS
  Administered 2018-12-17: 20 mg via INTRAVENOUS

## 2018-12-17 MED ORDER — ASPIRIN EC 325 MG PO TBEC
325.0000 mg | DELAYED_RELEASE_TABLET | Freq: Two times a day (BID) | ORAL | Status: DC
  Administered 2018-12-18: 09:00:00 325 mg via ORAL
  Filled 2018-12-17: qty 1

## 2018-12-17 MED ORDER — TRANEXAMIC ACID-NACL 1000-0.7 MG/100ML-% IV SOLN
1000.0000 mg | INTRAVENOUS | Status: AC
  Administered 2018-12-17: 1000 mg via INTRAVENOUS
  Filled 2018-12-17: qty 100

## 2018-12-17 MED ORDER — DOCUSATE SODIUM 100 MG PO CAPS
100.0000 mg | ORAL_CAPSULE | Freq: Two times a day (BID) | ORAL | Status: DC
  Administered 2018-12-17 – 2018-12-18 (×2): 100 mg via ORAL
  Filled 2018-12-17 (×2): qty 1

## 2018-12-17 MED ORDER — OXYCODONE HCL 5 MG PO TABS
5.0000 mg | ORAL_TABLET | ORAL | Status: DC | PRN
  Administered 2018-12-17: 5 mg via ORAL
  Filled 2018-12-17: qty 1

## 2018-12-17 MED ORDER — LACTATED RINGERS IV SOLN
INTRAVENOUS | Status: DC
  Administered 2018-12-17: 09:00:00 via INTRAVENOUS

## 2018-12-17 MED ORDER — DEXAMETHASONE SODIUM PHOSPHATE 10 MG/ML IJ SOLN
INTRAMUSCULAR | Status: DC | PRN
  Administered 2018-12-17: 10 mg via INTRAVENOUS

## 2018-12-17 MED ORDER — OXYCODONE HCL 5 MG PO TABS
10.0000 mg | ORAL_TABLET | ORAL | Status: DC | PRN
  Administered 2018-12-17 – 2018-12-18 (×4): 10 mg via ORAL
  Filled 2018-12-17 (×4): qty 2

## 2018-12-17 MED ORDER — DEXAMETHASONE SODIUM PHOSPHATE 10 MG/ML IJ SOLN
10.0000 mg | Freq: Once | INTRAMUSCULAR | Status: AC
  Administered 2018-12-18: 08:00:00 10 mg via INTRAVENOUS
  Filled 2018-12-17: qty 1

## 2018-12-17 MED ORDER — LORATADINE 10 MG PO TABS
10.0000 mg | ORAL_TABLET | Freq: Every day | ORAL | Status: DC
  Administered 2018-12-17 – 2018-12-18 (×2): 10 mg via ORAL
  Filled 2018-12-17 (×2): qty 1

## 2018-12-17 MED ORDER — SODIUM CHLORIDE 0.9 % IV SOLN
INTRAVENOUS | Status: DC
  Administered 2018-12-17 – 2018-12-18 (×2): via INTRAVENOUS

## 2018-12-17 MED ORDER — ONDANSETRON HCL 4 MG/2ML IJ SOLN
INTRAMUSCULAR | Status: AC
  Filled 2018-12-17: qty 2

## 2018-12-17 MED ORDER — FENTANYL CITRATE (PF) 100 MCG/2ML IJ SOLN
50.0000 ug | INTRAMUSCULAR | Status: DC
  Administered 2018-12-17: 10:00:00 50 ug via INTRAVENOUS
  Filled 2018-12-17: qty 2

## 2018-12-17 MED ORDER — ONDANSETRON HCL 4 MG/2ML IJ SOLN: 4.0000 mg | Freq: Once | INTRAMUSCULAR | Status: DC | PRN

## 2018-12-17 MED ORDER — ACETAMINOPHEN 10 MG/ML IV SOLN
1000.0000 mg | Freq: Four times a day (QID) | INTRAVENOUS | Status: DC
  Administered 2018-12-17: 11:00:00 1000 mg via INTRAVENOUS
  Filled 2018-12-17: qty 100

## 2018-12-17 MED ORDER — METHOCARBAMOL 500 MG PO TABS
500.0000 mg | ORAL_TABLET | Freq: Four times a day (QID) | ORAL | Status: DC | PRN
  Administered 2018-12-17 – 2018-12-18 (×2): 500 mg via ORAL
  Filled 2018-12-17 (×2): qty 1

## 2018-12-17 MED ORDER — POLYVINYL ALCOHOL 1.4 % OP SOLN
1.0000 [drp] | Freq: Four times a day (QID) | OPHTHALMIC | Status: DC | PRN
  Filled 2018-12-17: qty 15

## 2018-12-17 MED ORDER — ROPIVACAINE HCL 7.5 MG/ML IJ SOLN
INTRAMUSCULAR | Status: DC | PRN
  Administered 2018-12-17: 20 mL via PERINEURAL

## 2018-12-17 MED ORDER — POVIDONE-IODINE 10 % EX SWAB: 2.0000 "application " | Freq: Once | CUTANEOUS | Status: DC

## 2018-12-17 MED ORDER — DIPHENHYDRAMINE HCL 12.5 MG/5ML PO ELIX
12.5000 mg | ORAL_SOLUTION | ORAL | Status: DC | PRN
  Administered 2018-12-18: 01:00:00 25 mg via ORAL
  Filled 2018-12-17: qty 10

## 2018-12-17 MED ORDER — DEXAMETHASONE SODIUM PHOSPHATE 10 MG/ML IJ SOLN
INTRAMUSCULAR | Status: AC
  Filled 2018-12-17: qty 1

## 2018-12-17 MED ORDER — FENTANYL CITRATE (PF) 100 MCG/2ML IJ SOLN: 25.0000 ug | INTRAMUSCULAR | Status: DC | PRN

## 2018-12-17 MED ORDER — BUPIVACAINE LIPOSOME 1.3 % IJ SUSP
INTRAMUSCULAR | Status: DC | PRN
  Administered 2018-12-17: 20 mL

## 2018-12-17 SURGICAL SUPPLY — 59 items
ATTUNE MED DOME PAT 41 KNEE (Knees) ×2 IMPLANT
ATTUNE MED DOME PAT 41MM KNEE (Knees) ×1 IMPLANT
ATTUNE PS FEM RT SZ 8 CEM KNEE (Femur) ×3 IMPLANT
ATTUNE PSRP INSR SZ8 7 KNEE (Insert) ×2 IMPLANT
ATTUNE PSRP INSR SZ8 7MM KNEE (Insert) ×1 IMPLANT
BAG ZIPLOCK 12X15 (MISCELLANEOUS) IMPLANT
BANDAGE ACE 6X5 VEL STRL LF (GAUZE/BANDAGES/DRESSINGS) ×3 IMPLANT
BASE TIBIAL ROT PLAT SZ 8 KNEE (Knees) ×1 IMPLANT
BLADE SAG 18X100X1.27 (BLADE) ×3 IMPLANT
BLADE SAW SGTL 11.0X1.19X90.0M (BLADE) ×3 IMPLANT
BLADE SURG SZ10 CARB STEEL (BLADE) ×6 IMPLANT
BOWL SMART MIX CTS (DISPOSABLE) ×3 IMPLANT
CEMENT HV SMART SET (Cement) ×6 IMPLANT
CLOSURE WOUND 1/2 X4 (GAUZE/BANDAGES/DRESSINGS) ×2
COVER SURGICAL LIGHT HANDLE (MISCELLANEOUS) ×3 IMPLANT
COVER WAND RF STERILE (DRAPES) IMPLANT
CUFF TOURN SGL QUICK 34 (TOURNIQUET CUFF) ×2
CUFF TRNQT CYL 34X4.125X (TOURNIQUET CUFF) ×1 IMPLANT
DECANTER SPIKE VIAL GLASS SM (MISCELLANEOUS) ×6 IMPLANT
DRAPE U-SHAPE 47X51 STRL (DRAPES) ×3 IMPLANT
DRSG ADAPTIC 3X8 NADH LF (GAUZE/BANDAGES/DRESSINGS) ×3 IMPLANT
DRSG PAD ABDOMINAL 8X10 ST (GAUZE/BANDAGES/DRESSINGS) ×3 IMPLANT
DURAPREP 26ML APPLICATOR (WOUND CARE) ×3 IMPLANT
ELECT REM PT RETURN 15FT ADLT (MISCELLANEOUS) ×3 IMPLANT
EVACUATOR 1/8 PVC DRAIN (DRAIN) ×3 IMPLANT
GAUZE SPONGE 4X4 12PLY STRL (GAUZE/BANDAGES/DRESSINGS) ×3 IMPLANT
GLOVE BIO SURGEON STRL SZ7 (GLOVE) ×3 IMPLANT
GLOVE BIO SURGEON STRL SZ8 (GLOVE) ×3 IMPLANT
GLOVE BIOGEL PI IND STRL 6.5 (GLOVE) ×1 IMPLANT
GLOVE BIOGEL PI IND STRL 7.0 (GLOVE) ×1 IMPLANT
GLOVE BIOGEL PI IND STRL 8 (GLOVE) ×1 IMPLANT
GLOVE BIOGEL PI INDICATOR 6.5 (GLOVE) ×2
GLOVE BIOGEL PI INDICATOR 7.0 (GLOVE) ×2
GLOVE BIOGEL PI INDICATOR 8 (GLOVE) ×2
GLOVE SURG SS PI 6.5 STRL IVOR (GLOVE) ×3 IMPLANT
GOWN STRL REUS W/TWL LRG LVL3 (GOWN DISPOSABLE) ×9 IMPLANT
HANDPIECE INTERPULSE COAX TIP (DISPOSABLE) ×2
HOLDER FOLEY CATH W/STRAP (MISCELLANEOUS) IMPLANT
IMMOBILIZER KNEE 20 (SOFTGOODS) ×3
IMMOBILIZER KNEE 20 THIGH 36 (SOFTGOODS) ×1 IMPLANT
KIT TURNOVER KIT A (KITS) IMPLANT
MANIFOLD NEPTUNE II (INSTRUMENTS) ×3 IMPLANT
NS IRRIG 1000ML POUR BTL (IV SOLUTION) ×3 IMPLANT
PACK TOTAL KNEE CUSTOM (KITS) ×3 IMPLANT
PADDING CAST COTTON 6X4 STRL (CAST SUPPLIES) ×9 IMPLANT
PIN STEINMAN FIXATION KNEE (PIN) ×3 IMPLANT
PIN THREADED HEADED SIGMA (PIN) ×3 IMPLANT
PROTECTOR NERVE ULNAR (MISCELLANEOUS) ×3 IMPLANT
SET HNDPC FAN SPRY TIP SCT (DISPOSABLE) ×1 IMPLANT
STRIP CLOSURE SKIN 1/2X4 (GAUZE/BANDAGES/DRESSINGS) ×4 IMPLANT
SUT MNCRL AB 4-0 PS2 18 (SUTURE) ×3 IMPLANT
SUT STRATAFIX 0 PDS 27 VIOLET (SUTURE) ×3
SUT VIC AB 2-0 CT1 27 (SUTURE) ×6
SUT VIC AB 2-0 CT1 TAPERPNT 27 (SUTURE) ×3 IMPLANT
SUTURE STRATFX 0 PDS 27 VIOLET (SUTURE) ×1 IMPLANT
TIBIAL BASE ROT PLAT SZ 8 KNEE (Knees) ×3 IMPLANT
WATER STERILE IRR 1000ML POUR (IV SOLUTION) ×6 IMPLANT
WRAP KNEE MAXI GEL POST OP (GAUZE/BANDAGES/DRESSINGS) ×3 IMPLANT
YANKAUER SUCT BULB TIP 10FT TU (MISCELLANEOUS) ×3 IMPLANT

## 2018-12-17 NOTE — Anesthesia Procedure Notes (Signed)
Spinal  Patient location during procedure: OR Start time: 12/17/2018 10:30 AM End time: 12/17/2018 10:38 AM Staffing Anesthesiologist: Catalina Gravel, MD Performed: anesthesiologist  Preanesthetic Checklist Completed: patient identified, site marked, surgical consent, pre-op evaluation, timeout performed, IV checked, risks and benefits discussed and monitors and equipment checked Spinal Block Patient position: sitting Prep: site prepped and draped and DuraPrep Patient monitoring: heart rate, cardiac monitor, continuous pulse ox and blood pressure Approach: midline Location: L3-4 Injection technique: single-shot Needle Needle type: Pencan  Needle gauge: 24 G Needle length: 9 cm Assessment Sensory level: T10 Additional Notes

## 2018-12-17 NOTE — Anesthesia Procedure Notes (Signed)
Anesthesia Regional Block: Adductor canal block   Pre-Anesthetic Checklist: ,, timeout performed, Correct Patient, Correct Site, Correct Laterality, Correct Procedure, Correct Position, site marked, Risks and benefits discussed,  Surgical consent,  Pre-op evaluation,  At surgeon's request and post-op pain management  Laterality: Right  Prep: chloraprep       Needles:  Injection technique: Single-shot  Needle Type: Echogenic Needle     Needle Length: 9cm  Needle Gauge: 21     Additional Needles:   Procedures:,,,, ultrasound used (permanent image in chart),,,,  Narrative:  Start time: 12/17/2018 9:54 AM End time: 12/17/2018 10:04 AM Injection made incrementally with aspirations every 5 mL.  Performed by: Personally  Anesthesiologist: Catalina Gravel, MD  Additional Notes: No pain on injection. No increased resistance to injection. Injection made in 5cc increments.  Good needle visualization.  Patient tolerated procedure well.

## 2018-12-17 NOTE — Op Note (Signed)
OPERATIVE REPORT-TOTAL KNEE ARTHROPLASTY   Pre-operative diagnosis- Osteoarthritis  Right knee(s)  Post-operative diagnosis- Osteoarthritis Right knee(s)  Procedure-  Right  Total Knee Arthroplasty  Surgeon- Dione Plover. Mads Borgmeyer, MD  Assistant- Theresa Duty, PA-C   Anesthesia-  Adductor canal block and spinal  EBL- 25 ml   Drains Hemovac  Tourniquet time-  Total Tourniquet Time Documented: Thigh (Right) - 43 minutes Total: Thigh (Right) - 43 minutes     Complications- None  Condition-PACU - hemodynamically stable.   Brief Clinical Note  Dakota Ellison is a 68 y.o. year old male with end stage OA of his right knee with progressively worsening pain and dysfunction. He has constant pain, with activity and at rest and significant functional deficits with difficulties even with ADLs. He has had extensive non-op management including analgesics, injections of cortisone and viscosupplements, and home exercise program, but remains in significant pain with significant dysfunction. Radiographs show bone on bone arthritis bone on bone medial and patellofemoral. He presents now for right Total Knee Arthroplasty.    Procedure in detail---   The patient is brought into the operating room and positioned supine on the operating table. After successful administration of  Adductor canal block and spinal,   a tourniquet is placed high on the  Right thigh(s) and the lower extremity is prepped and draped in the usual sterile fashion. Time out is performed by the operating team and then the  Right lower extremity is wrapped in Esmarch, knee flexed and the tourniquet inflated to 300 mmHg.       A midline incision is made with a ten blade through the subcutaneous tissue to the level of the extensor mechanism. A fresh blade is used to make a medial parapatellar arthrotomy. Soft tissue over the proximal medial tibia is subperiosteally elevated to the joint line with a knife and into the semimembranosus  bursa with a Cobb elevator. Soft tissue over the proximal lateral tibia is elevated with attention being paid to avoiding the patellar tendon on the tibial tubercle. The patella is everted, knee flexed 90 degrees and the ACL and PCL are removed. Findings are bone on bone medial and patellofemoral with large global osteophytes.        The drill is used to create a starting hole in the distal femur and the canal is thoroughly irrigated with sterile saline to remove the fatty contents. The 5 degree Right  valgus alignment guide is placed into the femoral canal and the distal femoral cutting block is pinned to remove 10 mm off the distal femur. Resection is made with an oscillating saw.      The tibia is subluxed forward and the menisci are removed. The extramedullary alignment guide is placed referencing proximally at the medial aspect of the tibial tubercle and distally along the second metatarsal axis and tibial crest. The block is pinned to remove 80mm off the more deficient medial  side. Resection is made with an oscillating saw. Size 8is the most appropriate size for the tibia and the proximal tibia is prepared with the modular drill and keel punch for that size.      The femoral sizing guide is placed and size 8 is most appropriate. Rotation is marked off the epicondylar axis and confirmed by creating a rectangular flexion gap at 90 degrees. The size 8 cutting block is pinned in this rotation and the anterior, posterior and chamfer cuts are made with the oscillating saw. The intercondylar block is then placed and that  cut is made.      Trial size 8 tibial component, trial size 8 posterior stabilized femur and a 7  mm posterior stabilized rotating platform insert trial is placed. Full extension is achieved with excellent varus/valgus and anterior/posterior balance throughout full range of motion. The patella is everted and thickness measured to be 27  mm. Free hand resection is taken to 15 mm, a 41 template is  placed, lug holes are drilled, trial patella is placed, and it tracks normally. Osteophytes are removed off the posterior femur with the trial in place. All trials are removed and the cut bone surfaces prepared with pulsatile lavage. Cement is mixed and once ready for implantation, the size 8 tibial implant, size  8 posterior stabilized femoral component, and the size 41 patella are cemented in place and the patella is held with the clamp. The trial insert is placed and the knee held in full extension. The Exparel (20 ml mixed with 60 ml saline) is injected into the extensor mechanism, posterior capsule, medial and lateral gutters and subcutaneous tissues.  All extruded cement is removed and once the cement is hard the permanent 7 mm posterior stabilized rotating platform insert is placed into the tibial tray.      The wound is copiously irrigated with saline solution and the extensor mechanism closed over a hemovac drain with #1 V-loc suture. The tourniquet is released for a total tourniquet time of 43  minutes. Flexion against gravity is 140 degrees and the patella tracks normally. Subcutaneous tissue is closed with 2.0 vicryl and subcuticular with running 4.0 Monocryl. The incision is cleaned and dried and steri-strips and a bulky sterile dressing are applied. The limb is placed into a knee immobilizer and the patient is awakened and transported to recovery in stable condition.      Please note that a surgical assistant was a medical necessity for this procedure in order to perform it in a safe and expeditious manner. Surgical assistant was necessary to retract the ligaments and vital neurovascular structures to prevent injury to them and also necessary for proper positioning of the limb to allow for anatomic placement of the prosthesis.   Dakota RankinFrank V. Dakota Matuszak, MD    12/17/2018, 11:46 AM

## 2018-12-17 NOTE — Interval H&P Note (Signed)
History and Physical Interval Note:  12/17/2018 8:22 AM  Dakota Ellison  has presented today for surgery, with the diagnosis of right knee ostearthritis.  The various methods of treatment have been discussed with the patient and family. After consideration of risks, benefits and other options for treatment, the patient has consented to  Procedure(s) with comments: TOTAL KNEE ARTHROPLASTY (Right) - 41min as a surgical intervention.  The patient's history has been reviewed, patient examined, no change in status, stable for surgery.  I have reviewed the patient's chart and labs.  Questions were answered to the patient's satisfaction.     Pilar Plate Dakota Ellison

## 2018-12-17 NOTE — Transfer of Care (Signed)
Immediate Anesthesia Transfer of Care Note  Patient: Dakota Ellison [facility]  Procedure(s) Performed: TOTAL KNEE ARTHROPLASTY (Right Knee)  Patient Location: PACU  Anesthesia Type:Spinal  Level of Consciousness: awake, alert  and oriented  Airway & Oxygen Therapy: Patient Spontanous Breathing and Patient connected to face mask oxygen  Post-op Assessment: Report given to RN and Post -op Vital signs reviewed and stable  Post vital signs: Reviewed and stable  Last Vitals:  Vitals Value Taken Time  BP    Temp    Pulse    Resp    SpO2      Last Pain:  Vitals:   12/17/18 1007  TempSrc:   PainSc: 0-No pain      Patients Stated Pain Goal: 5 ([id number])  Complications: No apparent anesthesia complications

## 2018-12-17 NOTE — Anesthesia Procedure Notes (Signed)
Date/Time: 12/17/2018 10:31 AM Performed by: Sharlette Dense, CRNA Oxygen Delivery Method: Simple face mask

## 2018-12-17 NOTE — Progress Notes (Signed)
Assisted Dr. Turk with right, ultrasound guided, adductor canal block. Side rails up, monitors on throughout procedure. See vital signs in flow sheet. Tolerated Procedure well.  

## 2018-12-17 NOTE — Evaluation (Signed)
Physical Therapy Evaluation Patient Details Name: Dakota Ellison MRN: [id number] DOB: [birth year] Today's Date: 12/17/2018   History of Present Illness  s/p R TKA. PMH: L TKA, LBP, anxiety  Clinical Impression  Pt is s/p TKA resulting in the deficits listed below (see PT Problem List).  Pt doing well. amb ~ 51' with RW and min guard for safety--distance limited by PT. Should be ready for d/c tomorrow Pt will benefit from skilled PT to increase their independence and safety with mobility to allow discharge to the venue listed below.      Follow Up Recommendations Follow surgeon's recommendation for DC plan and follow-up therapies    Equipment Recommendations  None recommended by PT    Recommendations for Other Services       Precautions / Restrictions Precautions Precautions: Fall;Knee Required Braces or Orthoses: Knee Immobilizer - Right Knee Immobilizer - Right: Discontinue once straight leg raise with < 10 degree lag Restrictions Weight Bearing Restrictions: No Other Position/Activity Restrictions: WBAT      Mobility  Bed Mobility Overal bed mobility: Needs Assistance Bed Mobility: Supine to Sit     Supine to sit: Min guard     General bed mobility comments: for safety  Transfers Overall transfer level: Needs assistance Equipment used: Rolling walker (2 wheeled) Transfers: Sit to/from Stand Sit to Stand: Min guard         General transfer comment: cues for hand placement  Ambulation/Gait Ambulation/Gait assistance: Min guard Gait Distance (Feet): 90 Feet Assistive device: Rolling walker (2 wheeled) Gait Pattern/deviations: Step-to pattern;Step-through pattern;Decreased stance time - right     General Gait Details: cues for sequence, min/guard for safety, distance limited by PT  Stairs            Wheelchair Mobility    Modified Rankin (Stroke Patients Only)       Balance                                              Pertinent Vitals/Pain Pain Assessment: 0-10 Pain Score: 4  Pain Location: right knee Pain Descriptors / Indicators: Aching;Sore Pain Intervention(s): Monitored during session;Limited activity within patient's tolerance    Home Living Family/patient expects to be discharged to:: Private residence Living Arrangements: Alone Available Help at Discharge: Family Type of Home: House Home Access: Stairs to enter Entrance Stairs-Rails: None Entrance Stairs-Number of Steps: 4 and 1 Home Layout: One level Home Equipment: Environmental consultant - 2 wheels;Bedside commode;Crutches;Cane - single point      Prior Function Level of Independence: Independent               Hand Dominance        Extremity/Trunk Assessment   Upper Extremity Assessment Upper Extremity Assessment: Overall WFL for tasks assessed    Lower Extremity Assessment Lower Extremity Assessment: RLE deficits/detail RLE Deficits / Details: ankle WFL, knee extension and hip flexion 3/5. AAROM grossly  5 to 60 degrees flexion knee       Communication   Communication: No difficulties  Cognition Arousal/Alertness: Awake/alert Behavior During Therapy: WFL for tasks assessed/performed Overall Cognitive Status: Within Functional Limits for tasks assessed                                        General Comments  Exercises     Assessment/Plan    PT Assessment Patient needs continued PT services  PT Problem List Decreased strength;Decreased range of motion;Decreased activity tolerance;Decreased mobility;Pain;Decreased knowledge of use of DME       PT Treatment Interventions DME instruction;Therapeutic exercise;Gait training;Stair training;Therapeutic activities;Patient/family education    PT Goals (Current goals can be found in the Care Plan section)  Acute Rehab PT Goals PT Goal Formulation: With patient Time For Goal Achievement: 12/24/18 Potential to Achieve Goals: Good    Frequency  7X/week   Barriers to discharge        Co-evaluation               AM-PAC PT "6 Clicks" Mobility  Outcome Measure Help needed turning from your back to your side while in a flat bed without using bedrails?: A Little Help needed moving from lying on your back to sitting on the side of a flat bed without using bedrails?: A Little Help needed moving to and from a bed to a chair (including a wheelchair)?: A Little Help needed standing up from a chair using your arms (e.g., wheelchair or bedside chair)?: A Little Help needed to walk in hospital room?: A Little Help needed climbing 3-5 steps with a railing? : A Little 6 Click Score: 18    End of Session Equipment Utilized During Treatment: Gait belt Activity Tolerance: Patient tolerated treatment well Patient left: in chair;with call bell/phone within reach;with chair alarm set   PT Visit Diagnosis: Difficulty in walking, not elsewhere classified (R26.2)    Time: [id number] PT Time Calculation (min) (ACUTE ONLY): 24 min   Charges:   PT Evaluation $PT Eval Low Complexity: 1 Low PT Treatments $Gait Training: 8-22 mins        Dakota Ellison, PT  Pager: [phone] Acute Rehab Dept [facility]): [phone]   12/17/2018   [facility] LLCWILLIAMS,Dakota Nishida 12/17/2018, 6:53 PM

## 2018-12-17 NOTE — Discharge Instructions (Signed)
° °Dr. Frank Aluisio °Total Joint Specialist °[facility] °[address] °[place], [place] [address] °[phone] ° °TOTAL KNEE REPLACEMENT POSTOPERATIVE DIRECTIONS ° °Knee Rehabilitation, Guidelines Following Surgery  °Results after knee surgery are often greatly improved when you follow the exercise, range of motion and muscle strengthening exercises prescribed by your doctor. Safety measures are also important to protect the knee from further injury. Any time any of these exercises cause you to have increased pain or swelling in your knee joint, decrease the amount until you are comfortable again and slowly increase them. If you have problems or questions, call your caregiver or physical therapist for advice.  ° °HOME CARE INSTRUCTIONS  °• Remove items at home which could result in a fall. This includes throw rugs or furniture in walking pathways.  °· ICE to the affected knee every three hours for 30 minutes at a time and then as needed for pain and swelling.  Continue to use ice on the knee for pain and swelling from surgery. You may notice swelling that will progress down to the foot and ankle.  This is normal after surgery.  Elevate the leg when you are not up walking on it.   °· Continue to use the breathing machine which will help keep your temperature down.  It is common for your temperature to cycle up and down following surgery, especially at night when you are not up moving around and exerting yourself.  The breathing machine keeps your lungs expanded and your temperature down. °· Do not place pillow under knee, focus on keeping the knee straight while resting ° °DIET °You may resume your previous home diet once your are discharged from the hospital. ° °DRESSING / WOUND CARE / SHOWERING °You may change your dressing 3-5 days after surgery.  Then change the dressing every day with sterile gauze.  Please use good hand washing techniques before changing the dressing.  Do not use any lotions  or creams on the incision until instructed by your surgeon. °You may start showering once you are discharged home but do not submerge the incision under water. Just pat the incision dry and apply a dry gauze dressing on daily. °Change the surgical dressing daily and reapply a dry dressing each time. ° °ACTIVITY °Walk with your walker as instructed. °Use walker as long as suggested by your caregivers. °Avoid periods of inactivity such as sitting longer than an hour when not asleep. This helps prevent blood clots.  °You may resume a sexual relationship in one month or when given the OK by your doctor.  °You may return to work once you are cleared by your doctor.  °Do not drive a car for 6 weeks or until released by you surgeon.  °Do not drive while taking narcotics. ° °WEIGHT BEARING °Weight bearing as tolerated with assist device (walker, cane, etc) as directed, use it as long as suggested by your surgeon or therapist, typically at least 4-6 weeks. ° °POSTOPERATIVE CONSTIPATION PROTOCOL °Constipation - defined medically as fewer than three stools per week and severe constipation as less than one stool per week. ° °One of the most common issues patients have following surgery is constipation.  Even if you have a regular bowel pattern at home, your normal regimen is likely to be disrupted due to multiple reasons following surgery.  Combination of anesthesia, postoperative narcotics, change in appetite and fluid intake all can affect your bowels.  In order to avoid complications following surgery, here are some   recommendations in order to help you during your recovery period. ° °Colace (docusate) - Pick up an over-the-counter form of Colace or another stool softener and take twice a day as long as you are requiring postoperative pain medications.  Take with a full glass of water daily.  If you experience loose stools or diarrhea, hold the colace until you stool forms back up.  If your symptoms do not get better within 1  week or if they get worse, check with your doctor. ° °Dulcolax (bisacodyl) - Pick up over-the-counter and take as directed by the product packaging as needed to assist with the movement of your bowels.  Take with a full glass of water.  Use this product as needed if not relieved by Colace only.  ° °MiraLax (polyethylene glycol) - Pick up over-the-counter to have on hand.  MiraLax is a solution that will increase the amount of water in your bowels to assist with bowel movements.  Take as directed and can mix with a glass of water, juice, soda, coffee, or tea.  Take if you go more than two days without a movement. °Do not use MiraLax more than once per day. Call your doctor if you are still constipated or irregular after using this medication for 7 days in a row. ° °If you continue to have problems with postoperative constipation, please contact the office for further assistance and recommendations.  If you experience "the worst abdominal pain ever" or develop nausea or vomiting, please contact the office immediatly for further recommendations for treatment. ° °ITCHING °If you experience itching with your medications, try taking only a single pain pill, or even half a pain pill at a time.  You can also use Benadryl over the counter for itching or also to help with sleep.  ° °TED HOSE STOCKINGS °Wear the elastic stockings on both legs for three weeks following surgery during the day but you may remove then at night for sleeping. ° °MEDICATIONS °See your medication summary on the “After Visit Summary” that the nursing staff will review with you prior to discharge.  You may have some home medications which will be placed on hold until you complete the course of blood thinner medication.  It is important for you to complete the blood thinner medication as prescribed by your surgeon.  Continue your approved medications as instructed at time of discharge. ° °PRECAUTIONS °If you experience chest pain or shortness of breath -  call 911 immediately for transfer to the hospital emergency department.  °If you develop a fever greater that 101 F, purulent drainage from wound, increased redness or drainage from wound, foul odor from the wound/dressing, or calf pain - CONTACT YOUR SURGEON.   °                                                °FOLLOW-UP APPOINTMENTS °Make sure you keep all of your appointments after your operation with your surgeon and caregivers. You should call the office at the above phone number and make an appointment for approximately two weeks after the date of your surgery or on the date instructed by your surgeon outlined in the "After Visit Summary". ° °RANGE OF MOTION AND STRENGTHENING EXERCISES  °Rehabilitation of the knee is important following a knee injury or an operation. After just a few days of immobilization, the muscles of   the thigh which control the knee become weakened and shrink (atrophy). Knee exercises are designed to build up the tone and strength of the thigh muscles and to improve knee motion. Often times heat used for twenty to thirty minutes before working out will loosen up your tissues and help with improving the range of motion but do not use heat for the first two weeks following surgery. These exercises can be done on a training (exercise) mat, on the floor, on a table or on a bed. Use what ever works the best and is most comfortable for you Knee exercises include:  °• Leg Lifts - While your knee is still immobilized in a splint or cast, you can do straight leg raises. Lift the leg to 60 degrees, hold for 3 sec, and slowly lower the leg. Repeat 10-20 times 2-3 times daily. Perform this exercise against resistance later as your knee gets better.  °• Quad and Hamstring Sets - Tighten up the muscle on the front of the thigh (Quad) and hold for 5-10 sec. Repeat this 10-20 times hourly. Hamstring sets are done by pushing the foot backward against an object and holding for 5-10 sec. Repeat as with quad  sets.  °· Leg Slides: Lying on your back, slowly slide your foot toward your buttocks, bending your knee up off the floor (only go as far as is comfortable). Then slowly slide your foot back down until your leg is flat on the floor again. °· Angel Wings: Lying on your back spread your legs to the side as far apart as you can without causing discomfort.  °A rehabilitation program following serious knee injuries can speed recovery and prevent re-injury in the future due to weakened muscles. Contact your doctor or a physical therapist for more information on knee rehabilitation.  ° °IF YOU ARE TRANSFERRED TO A SKILLED REHAB FACILITY °If the patient is transferred to a skilled rehab facility following release from the hospital, a list of the current medications will be sent to the facility for the patient to continue.  When discharged from the skilled rehab facility, please have the facility set up the patient's Home Health Physical Therapy prior to being released. Also, the skilled facility will be responsible for providing the patient with their medications at time of release from the facility to include their pain medication, the muscle relaxants, and their blood thinner medication. If the patient is still at the rehab facility at time of the two week follow up appointment, the skilled rehab facility will also need to assist the patient in arranging follow up appointment in our office and any transportation needs. ° °MAKE SURE YOU:  °• Understand these instructions.  °• Get help right away if you are not doing well or get worse.  ° ° °Pick up stool softner and laxative for home use following surgery while on pain medications. °Do not submerge incision under water. °Please use good hand washing techniques while changing dressing each day. °May shower starting three days after surgery. °Please use a clean towel to pat the incision dry following showers. °Continue to use ice for pain and swelling after surgery. °Do not  use any lotions or creams on the incision until instructed by your surgeon. ° °

## 2018-12-18 ENCOUNTER — Encounter (HOSPITAL_COMMUNITY): Payer: Self-pay | Admitting: Orthopedic Surgery

## 2018-12-18 LAB — CBC
HCT: 40.6 % (ref 39.0–52.0)
Hemoglobin: 13.1 g/dL (ref 13.0–17.0)
MCH: 27.9 pg (ref 26.0–34.0)
MCHC: 32.3 g/dL (ref 30.0–36.0)
MCV: 86.4 fL (ref 80.0–100.0)
Platelets: 232 10*3/uL (ref 150–400)
RBC: 4.7 MIL/uL (ref 4.22–5.81)
RDW: 13.9 % (ref 11.5–15.5)
WBC: 17.3 10*3/uL — ABNORMAL HIGH (ref 4.0–10.5)
nRBC: 0 % (ref 0.0–0.2)

## 2018-12-18 LAB — BASIC METABOLIC PANEL
Anion gap: 12 (ref 5–15)
BUN: 23 mg/dL (ref 8–23)
CO2: 21 mmol/L — ABNORMAL LOW (ref 22–32)
Calcium: 8.7 mg/dL — ABNORMAL LOW (ref 8.9–10.3)
Chloride: 103 mmol/L (ref 98–111)
Creatinine, Ser: 1.18 mg/dL (ref 0.61–1.24)
GFR calc Af Amer: >60 mL/min (ref >60)
GFR calc non Af Amer: >60 mL/min (ref >60)
Glucose, Bld: 140 mg/dL — ABNORMAL HIGH (ref 70–99)
Potassium: 3.9 mmol/L (ref 3.5–5.1)
Sodium: 136 mmol/L (ref 135–145)

## 2018-12-18 MED ORDER — GABAPENTIN 300 MG PO CAPS: 300.0000 mg | ORAL_CAPSULE | Freq: Three times a day (TID) | ORAL | 0 refills | Status: DC

## 2018-12-18 MED ORDER — OXYCODONE HCL 5 MG PO TABS: 5.0000 mg | ORAL_TABLET | Freq: Four times a day (QID) | ORAL | 0 refills | Status: DC | PRN

## 2018-12-18 MED ORDER — ASPIRIN 325 MG PO TBEC: 325.0000 mg | DELAYED_RELEASE_TABLET | Freq: Two times a day (BID) | ORAL | 0 refills | Status: AC

## 2018-12-18 MED ORDER — METHOCARBAMOL 500 MG PO TABS: 500.0000 mg | ORAL_TABLET | Freq: Four times a day (QID) | ORAL | 0 refills | Status: DC | PRN

## 2018-12-18 NOTE — Progress Notes (Signed)
Subjective: 1 Day Post-Op Procedure(s) (LRB): TOTAL KNEE ARTHROPLASTY (Right) Patient reports pain as mild.   Patient seen in rounds by Dr. Wynelle Link. Patient is well, and has had no acute complaints or problems. States he is ready to go home. Denies chest pain, SOB, or calf pain. Voiding without difficulty. No issues overnight. Did well ambulating with therapy yesterday, will continue today.  Objective: Vital signs in last 24 hours: Temp:  [97.6 F (36.4 C)-98.5 F (36.9 C)] 97.9 F (36.6 C) (07/14 0530) Pulse Rate:  [64-95] 87 (07/14 0530) Resp:  [10-34] 20 (07/14 0530) BP: (98-161)/(59-103) 124/71 (07/14 0530) SpO2:  [94 %-100 %] 97 % (07/14 0530) Weight:  [125.4 kg] 125.4 kg (07/13 0846)  Intake/Output from previous day:  Intake/Output Summary (Last 24 hours) at 12/18/2018 0747 Last data filed at 12/18/2018 0656 Gross per 24 hour  Intake 4618.74 ml  Output 2170 ml  Net 2448.74 ml    Labs: Recent Labs    12/18/18 0318  HGB 13.1   Recent Labs    12/18/18 0318  WBC 17.3*  RBC 4.70  HCT 40.6  PLT 232   Recent Labs    12/18/18 0318  NA 136  K 3.9  CL 103  CO2 21*  BUN 23  CREATININE 1.18  GLUCOSE 140*  CALCIUM 8.7*   Exam: General - Patient is Alert and Oriented Extremity - Neurologically intact Neurovascular intact Sensation intact distally Dorsiflexion/Plantar flexion intact Dressing - dressing C/D/I Motor Function - intact, moving foot and toes well on exam.   Past Medical History:  Diagnosis Date  . Anxiety state 04/08/2014   denies  . Complication of anesthesia    and trouble waking up  . Degenerative disc disease   . Dysthymia    denies  . ED (erectile dysfunction)   . GERD (gastroesophageal reflux disease)   . History of MRSA infection    on lip from cut  . Hyperlipemia   . Hypertension    take BP med for small leak in heart to keep it small per pt.  . Mild asthma    as a teen  . Morbid obesity ([place])   . OSA (obstructive sleep  apnea)   . Osteoarthritis   . PONV (postoperative nausea and vomiting)   . Rectal fissure   . Testosterone deficiency     Assessment/Plan: 1 Day Post-Op Procedure(s) (LRB): TOTAL KNEE ARTHROPLASTY (Right) Active Problems:   Osteoarthritis of right knee  Estimated body mass index is 37.49 kg/m as calculated from the following:   Height as of this encounter: 6' (1.829 m).   Weight as of this encounter: 125.4 kg. Advance diet Up with therapy D/C IV fluids  Anticipated LOS equal to or greater than 2 midnights due to - Age 69 and older with one or more of the following:  - Obesity  - Expected need for hospital services (PT, OT, Nursing) required for safe  discharge  - Anticipated need for postoperative skilled nursing care or inpatient rehab  - Active co-morbidities: None OR   - Unanticipated findings during/Post Surgery: None  - Patient is a high risk of re-admission due to: None    DVT Prophylaxis - Aspirin Weight bearing as tolerated. D/C O2 and pulse ox and try on room air. Hemovac pulled without difficulty, will continue therapy today.  Plan is to go Home after hospital stay. Plan for discharge after one session of therapy this AM. Scheduled for outpatient PT at [facility] in [place]. Follow-up in  the office in 2 weeks.   Arther AbbottKristie Precilla Purnell, PA-C Orthopedic Surgery 12/18/2018, 7:47 AM

## 2018-12-18 NOTE — Progress Notes (Signed)
   12/18/18 1112  PT Visit Information  Last PT Received On 12/18/18 pt doing great, seen for HEP review. Ready for d/c  Assistance Needed +1  History of Present Illness s/p R TKA. PMH: L TKA, LBP, anxiety  Precautions  Precautions Fall;Knee  Precaution Comments d/c KI, IND SLR today  Restrictions  Other Position/Activity Restrictions WBAT  Pain Assessment  Pain Score 4  Pain Location right knee  Pain Descriptors / Indicators Aching;Sore  Pain Intervention(s) Limited activity within patient's tolerance;Monitored during session;Premedicated before session;Ice applied  Cognition  Arousal/Alertness Awake/alert  Behavior During Therapy WFL for tasks assessed/performed  Overall Cognitive Status Within Functional Limits for tasks assessed  Bed Mobility  Overal bed mobility Modified Independent  Total Joint Exercises  Ankle Circles/Pumps AROM;Both;10 reps  Quad Sets AROM;Both;10 reps  Short Arc Quad AROM;Right;10 reps  Heel Slides AROM;Right;10 reps  Straight Leg Raises AROM;Right;10 reps  Goniometric ROM 8* to 85* aarom right knee  PT - End of Session  Equipment Utilized During Treatment Gait belt  Activity Tolerance Patient tolerated treatment well  Patient left in bed;with call bell/phone within reach   PT - Assessment/Plan  PT Plan Current plan remains appropriate  PT Visit Diagnosis Difficulty in walking, not elsewhere classified (R26.2)  PT Frequency (ACUTE ONLY) 7X/week  Follow Up Recommendations Follow surgeon's recommendation for DC plan and follow-up therapies  PT equipment None recommended by PT  AM-PAC PT "6 Clicks" Mobility Outcome Measure (Version 2)  Help needed turning from your back to your side while in a flat bed without using bedrails? 4  Help needed moving from lying on your back to sitting on the side of a flat bed without using bedrails? 4  Help needed moving to and from a bed to a chair (including a wheelchair)? 4  Help needed standing up from a chair using  your arms (e.g., wheelchair or bedside chair)? 3  Help needed to walk in hospital room? 3  Help needed climbing 3-5 steps with a railing?  3  6 Click Score 21  Consider Recommendation of Discharge To: Home with no services  PT Goal Progression  Progress towards PT goals Progressing toward goals  Acute Rehab PT Goals  PT Goal Formulation With patient  Time For Goal Achievement 12/24/18  Potential to Achieve Goals Good  PT Time Calculation  PT Start Time (ACUTE ONLY) 1028  PT Stop Time (ACUTE ONLY) 1041  PT Time Calculation (min) (ACUTE ONLY) 13 min  PT General Charges  $$ ACUTE PT VISIT 1 Visit  PT Treatments  $Therapeutic Exercise 8-22 mins

## 2018-12-18 NOTE — Progress Notes (Signed)
Discharge Plan of Care: Scheduled Outpatient PT at [facility] in [place] Has DME

## 2018-12-18 NOTE — Progress Notes (Signed)
Physical Therapy Treatment Patient Details Name: Dakota Ellison MRN: [id number] DOB: 1950-10-02 Today's Date: 12/18/2018    History of Present Illness s/p R TKA. PMH: L TKA, LBP, anxiety    PT Comments    Pt progressing  Well.  Follow Up Recommendations  Follow surgeon's recommendation for DC plan and follow-up therapies     Equipment Recommendations  None recommended by PT    Recommendations for Other Services       Precautions / Restrictions Precautions Precautions: Fall;Knee Precaution Comments: d/c KI, IND SLR today Required Braces or Orthoses: Knee Immobilizer - Right Restrictions Weight Bearing Restrictions: No Other Position/Activity Restrictions: WBAT    Mobility  Bed Mobility               General bed mobility comments: on EOB  Transfers Overall transfer level: Needs assistance Equipment used: Rolling walker (2 wheeled) Transfers: Sit to/from Stand Sit to Stand: Supervision         General transfer comment: cues for hand placement  Ambulation/Gait Ambulation/Gait assistance: Supervision Gait Distance (Feet): 180 Feet Assistive device: Rolling walker (2 wheeled) Gait Pattern/deviations: Step-to pattern;Step-through pattern;Decreased stance time - right     General Gait Details: cues for sequence, min/guard for safety   Stairs Stairs: Yes Stairs assistance: Min guard Stair Management: One rail Right;Step to pattern;Forwards Number of Stairs: 4 General stair comments: cues for sequence   Wheelchair Mobility    Modified Rankin (Stroke Patients Only)       Balance                                            Cognition Arousal/Alertness: Awake/alert Behavior During Therapy: WFL for tasks assessed/performed Overall Cognitive Status: Within Functional Limits for tasks assessed                                        Exercises      General Comments        Pertinent Vitals/Pain Pain  Assessment: 0-10 Pain Score: 5  Pain Location: right knee Pain Descriptors / Indicators: Aching;Sore Pain Intervention(s): Limited activity within patient's tolerance;Monitored during session;Premedicated before session;Ice applied    Home Living                      Prior Function            PT Goals (current goals can now be found in the care plan section) Acute Rehab PT Goals PT Goal Formulation: With patient Time For Goal Achievement: 12/24/18 Potential to Achieve Goals: Good Progress towards PT goals: Progressing toward goals    Frequency    7X/week      PT Plan Current plan remains appropriate    Co-evaluation              AM-PAC PT "6 Clicks" Mobility   Outcome Measure  Help needed turning from your back to your side while in a flat bed without using bedrails?: A Little Help needed moving from lying on your back to sitting on the side of a flat bed without using bedrails?: None Help needed moving to and from a bed to a chair (including a wheelchair)?: None Help needed standing up from a chair using your arms (e.g., wheelchair or bedside chair)?: A Little  Help needed to walk in hospital room?: A Little Help needed climbing 3-5 steps with a railing? : A Little 6 Click Score: 20    End of Session Equipment Utilized During Treatment: Gait belt Activity Tolerance: Patient tolerated treatment well Patient left: with call bell/phone within reach;Other (comment);with nursing/sitter in room(EOB )   PT Visit Diagnosis: Difficulty in walking, not elsewhere classified (R26.2)     Time: 1000-1015 PT Time Calculation (min) (ACUTE ONLY): 15 min  Charges:  $Gait Training: 8-22 mins                     Drucilla Chaletara Saylee Sherrill, PT  Pager: [phone] Acute Rehab Dept [facility]): [phone]   12/18/2018    [facility] LLCWILLIAMS,Tanishi Nault 12/18/2018, 10:19 AM

## 2018-12-18 NOTE — Plan of Care (Signed)
  Problem: Education: Goal: Knowledge of General Education information will improve Description: Including pain rating scale, medication(s)/side effects and non-pharmacologic comfort measures Outcome: Adequate for Discharge   Problem: Health Behavior/Discharge Planning: Goal: Ability to manage health-related needs will improve Outcome: Adequate for Discharge   Problem: Clinical Measurements: Goal: Ability to maintain clinical measurements within normal limits will improve Outcome: Adequate for Discharge Goal: Will remain free from infection Outcome: Adequate for Discharge Goal: Cardiovascular complication will be avoided Outcome: Adequate for Discharge   Problem: Activity: Goal: Risk for activity intolerance will decrease Outcome: Adequate for Discharge   Problem: Nutrition: Goal: Adequate nutrition will be maintained Outcome: Adequate for Discharge   Problem: Coping: Goal: Level of anxiety will decrease Outcome: Adequate for Discharge   Problem: Elimination: Goal: Will not experience complications related to bowel motility Outcome: Adequate for Discharge Goal: Will not experience complications related to urinary retention Outcome: Adequate for Discharge   Problem: Pain Managment: Goal: General experience of comfort will improve Outcome: Adequate for Discharge   Problem: Safety: Goal: Ability to remain free from injury will improve Outcome: Adequate for Discharge   Problem: Skin Integrity: Goal: Risk for impaired skin integrity will decrease Outcome: Adequate for Discharge   Problem: Education: Goal: Knowledge of the prescribed therapeutic regimen will improve Outcome: Adequate for Discharge Goal: Individualized Educational Video(s) Outcome: Adequate for Discharge   Problem: Activity: Goal: Ability to avoid complications of mobility impairment will improve Outcome: Adequate for Discharge Goal: Range of joint motion will improve Outcome: Adequate for  Discharge   Problem: Clinical Measurements: Goal: Postoperative complications will be avoided or minimized Outcome: Adequate for Discharge   Problem: Pain Management: Goal: Pain level will decrease with appropriate interventions Outcome: Adequate for Discharge   Problem: Skin Integrity: Goal: Will show signs of wound healing Outcome: Adequate for Discharge  Home with friend. Discharge teaching done. Written information given.

## 2018-12-19 NOTE — Discharge Summary (Signed)
Physician Discharge Summary   Patient ID: Dakota Ellison MRN: [id number] DOB/AGE: 67-Mar-1952 68 y.o.  Admit date: 12/17/2018 Discharge date: 12/18/2018  Primary Diagnosis: Osteoarthritis, right knee   Admission Diagnoses:  Past Medical History:  Diagnosis Date  . Anxiety state 04/08/2014   denies  . Complication of anesthesia    and trouble waking up  . Degenerative disc disease   . Dysthymia    denies  . ED (erectile dysfunction)   . GERD (gastroesophageal reflux disease)   . History of MRSA infection    on lip from cut  . Hyperlipemia   . Hypertension    take BP med for small leak in heart to keep it small per pt.  . Mild asthma    as a teen  . Morbid obesity (HCC)   . OSA (obstructive sleep apnea)   . Osteoarthritis   . PONV (postoperative nausea and vomiting)   . Rectal fissure   . Testosterone deficiency    Discharge Diagnoses:   Active Problems:   Osteoarthritis of right knee  Estimated body mass index is 37.49 kg/m as calculated from the following:   Height as of this encounter: 6' (1.829 m).   Weight as of this encounter: 125.4 kg.  Procedure:  Procedure(s) (LRB): TOTAL KNEE ARTHROPLASTY (Right)   Consults: None  HPI: Dakota Ellison is a 68 y.o. year old male with end stage OA of his right knee with progressively worsening pain and dysfunction. He has constant pain, with activity and at rest and significant functional deficits with difficulties even with ADLs. He has had extensive non-op management including analgesics, injections of cortisone and viscosupplements, and home exercise program, but remains in significant pain with significant dysfunction. Radiographs show bone on bone arthritis bone on bone medial and patellofemoral. He presents now for right Total Knee Arthroplasty.    Laboratory Data: Admission on 12/17/2018, Discharged on 12/18/2018  Component Date Value Ref Range Status  . WBC 12/18/2018 17.3* 4.0 - 10.5 K/uL Final  . RBC 12/18/2018 4.70   4.22 - 5.81 MIL/uL Final  . Hemoglobin 12/18/2018 13.1  13.0 - 17.0 g/dL Final  . HCT 36/64/4034 40.6  39.0 - 52.0 % Final  . MCV 12/18/2018 86.4  80.0 - 100.0 fL Final  . MCH 12/18/2018 27.9  26.0 - 34.0 pg Final  . MCHC 12/18/2018 32.3  30.0 - 36.0 g/dL Final  . RDW 74/25/9563 13.9  11.5 - 15.5 % Final  . Platelets 12/18/2018 232  150 - 400 K/uL Final  . nRBC 12/18/2018 0.0  0.0 - 0.2 % Final   Performed at [facility], [address], [place], [place] [address]  . Sodium 12/18/2018 136  135 - 145 mmol/L Final  . Potassium 12/18/2018 3.9  3.5 - 5.1 mmol/L Final  . Chloride 12/18/2018 103  98 - 111 mmol/L Final  . CO2 12/18/2018 21* 22 - 32 mmol/L Final  . Glucose, Bld 12/18/2018 140* 70 - 99 mg/dL Final  . BUN 33/29/5188 23  8 - 23 mg/dL Final  . Creatinine, Ser 12/18/2018 1.18  0.61 - 1.24 mg/dL Final  . Calcium 41/66/0630 8.7* 8.9 - 10.3 mg/dL Final  . GFR calc non Af Amer 12/18/2018 >60  >60 mL/min Final  . GFR calc Af Amer 12/18/2018 >60  >60 mL/min Final  . Anion gap 12/18/2018 12  5 - 15 Final   Performed at [facility], [address], [place], [place] [address]  Hospital Outpatient  Visit on 12/13/2018  Component Date Value Ref Range Status  . SARS Coronavirus 2 12/13/2018 NEGATIVE  NEGATIVE Final   Comment: (NOTE) SARS-CoV-2 target nucleic acids are NOT DETECTED. The SARS-CoV-2 RNA is generally detectable in upper and lower respiratory specimens during the acute phase of infection. Negative results do not preclude SARS-CoV-2 infection, do not rule out co-infections with other pathogens, and should not be used as the sole basis for treatment or other patient management decisions. Negative results must be combined with clinical observations, patient history, and epidemiological information. The expected result is Negative. Fact Sheet for Patients: HairSlick.nohttps://www.fda.gov/media/138098/download Fact Sheet for Healthcare Providers:  quierodirigir.comhttps://www.fda.gov/media/138095/download This test is not yet approved or cleared by the [place] FDA and  has been authorized for detection and/or diagnosis of SARS-CoV-2 by FDA under an Emergency Use Authorization (EUA). This EUA will remain  in effect (meaning this test can be used) for the duration of the COVID-19 declaration under Section 56                          4(b)(1) of the Act, 21 U.S.C. section 360bbb-3(b)(1), unless the authorization is terminated or revoked sooner. Performed at [facility], [address] [place], [place] [address]   Hospital Outpatient Visit on 12/13/2018  Component Date Value Ref Range Status  . aPTT 12/13/2018 31  24 - 36 seconds Final   Performed at [facility], [address] [place], [place] [address]  . WBC 12/13/2018 9.9  4.0 - 10.5 K/uL Final  . RBC 12/13/2018 5.67  4.22 - 5.81 MIL/uL Final  . Hemoglobin 12/13/2018 16.2  13.0 - 17.0 g/dL Final  . HCT 52/84/132407/02/2019 48.1  39.0 - 52.0 % Final  . MCV 12/13/2018 84.8  80.0 - 100.0 fL Final  . MCH 12/13/2018 28.6  26.0 - 34.0 pg Final  . MCHC 12/13/2018 33.7  30.0 - 36.0 g/dL Final  . RDW 40/10/272507/02/2019 13.8  11.5 - 15.5 % Final  . Platelets 12/13/2018 277  150 - 400 K/uL Final  . nRBC 12/13/2018 0.0  0.0 - 0.2 % Final   Performed at [facility], [address] [place], [place] [address]  . Sodium 12/13/2018 139  135 - 145 mmol/L Final  . Potassium 12/13/2018 4.2  3.5 - 5.1 mmol/L Final  . Chloride 12/13/2018 105  98 - 111 mmol/L Final  . CO2 12/13/2018 23  22 - 32 mmol/L Final  . Glucose, Bld 12/13/2018 99  70 - 99 mg/dL Final  . BUN 03/47/425907/02/2019 21  8 - 23 mg/dL Final  . Creatinine, Ser 12/13/2018 1.16  0.61 - 1.24 mg/dL Final  . Calcium 56/38/756407/02/2019 9.7  8.9 - 10.3 mg/dL Final  . Total Protein 12/13/2018 8.4* 6.5 - 8.1 g/dL Final  . Albumin 33/29/518807/02/2019 4.6  3.5 - 5.0 g/dL Final  . AST 41/66/063007/02/2019 28  15 - 41 U/L Final  . ALT 12/13/2018 28  0 - 44  U/L Final  . Alkaline Phosphatase 12/13/2018 81  38 - 126 U/L Final  . Total Bilirubin 12/13/2018 0.8  0.3 - 1.2 mg/dL Final  . GFR calc non Af Amer 12/13/2018 >60  >60 mL/min Final  . GFR calc Af Amer 12/13/2018 >60  >60 mL/min Final  . Anion gap 12/13/2018 11  5 - 15 Final   Performed at [facility], [address] [place], [place] [address]  . Prothrombin Time 12/13/2018 12.9  11.4 - 15.2 seconds Final  . INR 12/13/2018 1.0  0.8 - 1.2 Final   Comment: (NOTE) INR goal varies based on device and disease states. Performed at [facility], [address] [place], [place] [address]   . ABO/RH(D) 12/13/2018 A NEG   Final  . Antibody Screen 12/13/2018 NEG   Final  . Sample Expiration 12/13/2018 12/20/2018,2359   Final  . Extend sample reason 12/13/2018    Final                   Value:NO TRANSFUSIONS OR PREGNANCY IN THE PAST 3 MONTHS Performed at [facility], [address], [place], [place] [address]   . MRSA, PCR 12/13/2018 NEGATIVE  NEGATIVE Final  . Staphylococcus aureus 12/13/2018 NEGATIVE  NEGATIVE Final   Comment: (NOTE) The Xpert SA Assay (FDA approved for NASAL specimens in patients 40 years of age and older), is one component of a comprehensive surveillance program. It is not intended to diagnose infection nor to guide or monitor treatment. Performed at [facility], [address], [place], [place] [address]   Office Visit on 12/05/2018  Component Date Value Ref Range Status  . Prescribed Drug 1 12/05/2018 Hydrocodone   Final  . Prescribed Drug 2 12/05/2018 Alprazolam   Final  . Creatinine 12/05/2018 232.0  mg/dL Final  . pH 29/56/2130 4.8  4.5 - 9.0 Final  . Oxidant 12/05/2018 NEGATIVE  mcg/mL Final  . Amphetamines 12/05/2018 NEGATIVE  ng/mL Final  . [facility] Amphetamines 12/05/2018 CONSISTENT   Final  . Benzodiazepines 12/05/2018 NEGATIVE  ng/mL Final  . medMATCH Benzodiazepines  12/05/2018 INCONSISTENT   Final  . Marijuana Metabolite 12/05/2018 NEGATIVE  ng/mL Final  . medMATCH Marijuana Metab 12/05/2018 CONSISTENT   Final  . Cocaine Metabolite 12/05/2018 NEGATIVE  ng/mL Final  . medMATCH Cocaine Metab 12/05/2018 CONSISTENT   Final  . Opiates 12/05/2018 POSITIVE  ng/mL Final  . Codeine 12/05/2018 NEGATIVE  ng/mL Final   Comment: This test was developed and its analytical performance characteristics have been determined by [employer]. It has not been cleared or approved by the FDA. This assay has been validated pursuant to the CLIA regulations and is used for clinical purposes.   . medMATCH Codeine 12/05/2018 CONSISTENT   Final  . Hydrocodone 12/05/2018 343  ng/mL Final   Comment: This test was developed and its analytical performance characteristics have been determined by [employer]. It has not been cleared or approved by the FDA. This assay has been validated pursuant to the CLIA regulations and is used for clinical purposes.   . medMATCH Hydrocodone 12/05/2018 CONSISTENT   Final  . Hydromorphone 12/05/2018 118  ng/mL Final   Comment: This test was developed and its analytical performance characteristics have been determined by [employer]. It has not been cleared or approved by the FDA. This assay has been validated pursuant to the CLIA regulations and is used for clinical purposes.   . medMATCH Hydromorphone 12/05/2018 CONSISTENT   Final   Comment: Hydromorphone is a metabolite of hydrocodone as well as a prescribed drug.   Marland Kitchen Morphine 12/05/2018 NEGATIVE  ng/mL Final   Comment: This test was developed and its analytical performance characteristics have been determined by [employer]. It has not been cleared or approved by the FDA. This assay has been validated pursuant to the CLIA regulations and is used for clinical purposes.   . medMATCH Morphine 12/05/2018 CONSISTENT   Final  . Norhydrocodone  12/05/2018 307  ng/mL  Final   Comment: This test was developed and its analytical performance characteristics have been determined by [employer]. It has not been cleared or approved by the FDA. This assay has been validated pursuant to the CLIA regulations and is used for clinical purposes.   . medMATCH Norhydrocodone 12/05/2018 CONSISTENT   Final   Norhydrocodone is a metabolite of Hydrocodone.  . Oxycodone 12/05/2018 NEGATIVE  ng/mL Final  . medMATCH Oxycodone 12/05/2018 CONSISTENT   Final  . Prescribed Drug 1 12/05/2018 Hydrocodone   Final  . Prescribed Drug 2 12/05/2018 Alprazolam   Final  . Buprenorphine, Urine 12/05/2018 NEGATIVE  ng/mL Final  . medMATCH Buprenorphine 12/05/2018 CONSISTENT   Final  . Prescribed Drug 1 12/05/2018 Hydrocodone   Final  . Prescribed Drug 2 12/05/2018 Alprazolam   Final  . MDMA 12/05/2018 NEGATIVE  ng/mL Final  . [facility] MDMA 12/05/2018 CONSISTENT   Final  . Prescribed Drug 1 12/05/2018 Hydrocodone   Final  . Prescribed Drug 2 12/05/2018 Alprazolam   Final  . Alcohol Metabolites 12/05/2018 NEGATIVE  <500 ng/mL Final  . medMATCH Alcohol Metab 12/05/2018 CONSISTENT   Final  . Prescribed Drug 1 12/05/2018 Hydrocodone   Final  . Prescribed Drug 2 12/05/2018 Alprazolam   Final  . 6 Acetylmorphine 12/05/2018 NEGATIVE  ng/mL Final  . medMATCH 6 Acetylmorphine 12/05/2018 CONSISTENT   Final   Comment: See Note 1 See Note 1 See Note 1 See Note 1 See Note 1 . Note 1 This drug testing is for medical treatment only.   Analysis was performed as non-forensic testing and  these results should be used only by healthcare  providers to render diagnosis or treatment, or to  monitor progress of medical conditions. Hazel Sams comments are:  - present when drug test results may be the result of     metabolism of one or more drugs or when results are     inconsistent with prescribed medication(s) listed.  - may be blank when drug results are consistent with     prescribed  medication(s) listed. . For assistance with interpreting these drug results,  please contact a [employer] Toxicology  Specialist: [phone]), M-F,  8am-6pm EST.      X-Rays:No results found.  EKG: Orders placed or performed in visit on 12/05/18  . EKG 12-Lead     Hospital Course: Dakota Ellison is a 68 y.o. who was admitted to [facility]. They were brought to the operating room on 12/17/2018 and underwent Procedure(s): TOTAL KNEE ARTHROPLASTY.  Patient tolerated the procedure well and was later transferred to the recovery room and then to the orthopaedic floor for postoperative care. They were given PO and IV analgesics for pain control following their surgery. They were given 24 hours of postoperative antibiotics of  Anti-infectives (From admission, onward)   Start     Dose/Rate Route Frequency Ordered Stop   12/17/18 1630  ceFAZolin (ANCEF) IVPB 2g/100 mL premix     2 g 200 mL/hr over 30 Minutes Intravenous Every 6 hours 12/17/18 1339 12/17/18 2214   12/17/18 0600  ceFAZolin (ANCEF) 3 g in dextrose 5 % 50 mL IVPB     3 g 100 mL/hr over 30 Minutes Intravenous On call to O.R. 12/16/18 1003 12/17/18 1043     and started on DVT prophylaxis in the form of Aspirin.   PT and OT were ordered for total joint protocol. Discharge planning consulted to help with postop  disposition and equipment needs.  Patient had a good night on the evening of surgery. They started to get up OOB with therapy on POD #0. Pt was seen during rounds and was ready to go home pending progress with therapy. Hemovac drain was pulled without difficulty. He worked with therapy on POD #1 and was meeting his goals. Pt was discharged to home later that day in stable condition.  Diet: Regular diet Activity: WBAT Follow-up: in 2 weeks Disposition: Home with outpatient physical therapy at [facility] in [place] Discharged Condition: stable   Discharge Instructions    Call MD / Call 911    Complete by: As directed    If you experience chest pain or shortness of breath, CALL 911 and be transported to the hospital emergency room.  If you develope a fever above 101 F, pus (white drainage) or increased drainage or redness at the wound, or calf pain, call your surgeon's office.   Change dressing   Complete by: As directed    Change dressing on Wednesday, then change the dressing daily with sterile 4 x 4 inch gauze dressing and apply TED hose.   Constipation Prevention   Complete by: As directed    Drink plenty of fluids.  Prune juice may be helpful.  You may use a stool softener, such as Colace (over the counter) 100 mg twice a day.  Use MiraLax (over the counter) for constipation as needed.   Diet - low sodium heart healthy   Complete by: As directed    Discharge instructions   Complete by: As directed    Dr. [name] Total Joint Specialist [facility] [address], [address] [place], [place] [address] [phone]  TOTAL KNEE REPLACEMENT POSTOPERATIVE DIRECTIONS  Knee Rehabilitation, Guidelines Following Surgery  Results after knee surgery are often greatly improved when you follow the exercise, range of motion and muscle strengthening exercises prescribed by your doctor. Safety measures are also important to protect the knee from further injury. Any time any of these exercises cause you to have increased pain or swelling in your knee joint, decrease the amount until you are comfortable again and slowly increase them. If you have problems or questions, call your caregiver or physical therapist for advice.   HOME CARE INSTRUCTIONS  Remove items at home which could result in a fall. This includes throw rugs or furniture in walking pathways.  ICE to the affected knee every three hours for 30 minutes at a time and then as needed for pain and swelling.  Continue to use ice on the knee for pain and swelling from surgery. You may notice swelling that will progress down to  the foot and ankle.  This is normal after surgery.  Elevate the leg when you are not up walking on it.   Continue to use the breathing machine which will help keep your temperature down.  It is common for your temperature to cycle up and down following surgery, especially at night when you are not up moving around and exerting yourself.  The breathing machine keeps your lungs expanded and your temperature down. Do not place pillow under knee, focus on keeping the knee straight while resting   DIET You may resume your previous home diet once your are discharged from the hospital.  DRESSING / WOUND CARE / SHOWERING You may change your dressing 3-5 days after surgery.  Then change the dressing every day with sterile gauze.  Please use good hand washing techniques before changing the  dressing.  Do not use any lotions or creams on the incision until instructed by your surgeon. You may start showering once you are discharged home but do not submerge the incision under water. Just pat the incision dry and apply a dry gauze dressing on daily. Change the surgical dressing daily and reapply a dry dressing each time.  ACTIVITY Walk with your walker as instructed. Use walker as long as suggested by your caregivers. Avoid periods of inactivity such as sitting longer than an hour when not asleep. This helps prevent blood clots.  You may resume a sexual relationship in one month or when given the OK by your doctor.  You may return to work once you are cleared by your doctor.  Do not drive a car for 6 weeks or until released by you surgeon.  Do not drive while taking narcotics.  WEIGHT BEARING Weight bearing as tolerated with assist device (walker, cane, etc) as directed, use it as long as suggested by your surgeon or therapist, typically at least 4-6 weeks.  POSTOPERATIVE CONSTIPATION PROTOCOL Constipation - defined medically as fewer than three stools per week and severe constipation as less than one  stool per week.  One of the most common issues patients have following surgery is constipation.  Even if you have a regular bowel pattern at home, your normal regimen is likely to be disrupted due to multiple reasons following surgery.  Combination of anesthesia, postoperative narcotics, change in appetite and fluid intake all can affect your bowels.  In order to avoid complications following surgery, here are some recommendations in order to help you during your recovery period.  Colace (docusate) - Pick up an over-the-counter form of Colace or another stool softener and take twice a day as long as you are requiring postoperative pain medications.  Take with a full glass of water daily.  If you experience loose stools or diarrhea, hold the colace until you stool forms back up.  If your symptoms do not get better within 1 week or if they get worse, check with your doctor.  Dulcolax (bisacodyl) - Pick up over-the-counter and take as directed by the product packaging as needed to assist with the movement of your bowels.  Take with a full glass of water.  Use this product as needed if not relieved by Colace only.   MiraLax (polyethylene glycol) - Pick up over-the-counter to have on hand.  MiraLax is a solution that will increase the amount of water in your bowels to assist with bowel movements.  Take as directed and can mix with a glass of water, juice, soda, coffee, or tea.  Take if you go more than two days without a movement. Do not use MiraLax more than once per day. Call your doctor if you are still constipated or irregular after using this medication for 7 days in a row.  If you continue to have problems with postoperative constipation, please contact the office for further assistance and recommendations.  If you experience "the worst abdominal pain ever" or develop nausea or vomiting, please contact the office immediatly for further recommendations for treatment.  ITCHING  If you experience itching  with your medications, try taking only a single pain pill, or even half a pain pill at a time.  You can also use Benadryl over the counter for itching or also to help with sleep.   TED HOSE STOCKINGS Wear the elastic stockings on both legs for three weeks following surgery during the day but  you may remove then at night for sleeping.  MEDICATIONS See your medication summary on the "After Visit Summary" that the nursing staff will review with you prior to discharge.  You may have some home medications which will be placed on hold until you complete the course of blood thinner medication.  It is important for you to complete the blood thinner medication as prescribed by your surgeon.  Continue your approved medications as instructed at time of discharge.  PRECAUTIONS If you experience chest pain or shortness of breath - call 911 immediately for transfer to the hospital emergency department.  If you develop a fever greater that 101 F, purulent drainage from wound, increased redness or drainage from wound, foul odor from the wound/dressing, or calf pain - CONTACT YOUR SURGEON.                                                   FOLLOW-UP APPOINTMENTS Make sure you keep all of your appointments after your operation with your surgeon and caregivers. You should call the office at the above phone number and make an appointment for approximately two weeks after the date of your surgery or on the date instructed by your surgeon outlined in the "After Visit Summary".   RANGE OF MOTION AND STRENGTHENING EXERCISES  Rehabilitation of the knee is important following a knee injury or an operation. After just a few days of immobilization, the muscles of the thigh which control the knee become weakened and shrink (atrophy). Knee exercises are designed to build up the tone and strength of the thigh muscles and to improve knee motion. Often times heat used for twenty to thirty minutes before working out will loosen up  your tissues and help with improving the range of motion but do not use heat for the first two weeks following surgery. These exercises can be done on a training (exercise) mat, on the floor, on a table or on a bed. Use what ever works the best and is most comfortable for you Knee exercises include:  Leg Lifts - While your knee is still immobilized in a splint or cast, you can do straight leg raises. Lift the leg to 60 degrees, hold for 3 sec, and slowly lower the leg. Repeat 10-20 times 2-3 times daily. Perform this exercise against resistance later as your knee gets better.  Quad and Hamstring Sets - Tighten up the muscle on the front of the thigh (Quad) and hold for 5-10 sec. Repeat this 10-20 times hourly. Hamstring sets are done by pushing the foot backward against an object and holding for 5-10 sec. Repeat as with quad sets.  Leg Slides: Lying on your back, slowly slide your foot toward your buttocks, bending your knee up off the floor (only go as far as is comfortable). Then slowly slide your foot back down until your leg is flat on the floor again. Angel Wings: Lying on your back spread your legs to the side as far apart as you can without causing discomfort.  A rehabilitation program following serious knee injuries can speed recovery and prevent re-injury in the future due to weakened muscles. Contact your doctor or a physical therapist for more information on knee rehabilitation.   IF YOU ARE TRANSFERRED TO A SKILLED REHAB FACILITY If the patient is transferred to a skilled rehab facility following release  from the hospital, a list of the current medications will be sent to the facility for the patient to continue.  When discharged from the skilled rehab facility, please have the facility set up the patient's Home Health Physical Therapy prior to being released. Also, the skilled facility will be responsible for providing the patient with their medications at time of release from the facility to  include their pain medication, the muscle relaxants, and their blood thinner medication. If the patient is still at the rehab facility at time of the two week follow up appointment, the skilled rehab facility will also need to assist the patient in arranging follow up appointment in our office and any transportation needs.  MAKE SURE YOU:  Understand these instructions.  Get help right away if you are not doing well or get worse.    Pick up stool softner and laxative for home use following surgery while on pain medications. Do not submerge incision under water. Please use good hand washing techniques while changing dressing each day. May shower starting three days after surgery. Please use a clean towel to pat the incision dry following showers. Continue to use ice for pain and swelling after surgery. Do not use any lotions or creams on the incision until instructed by your surgeon.   Do not put a pillow under the knee. Place it under the heel.   Complete by: As directed    Driving restrictions   Complete by: As directed    No driving for two weeks   TED hose   Complete by: As directed    Use stockings (TED hose) for three weeks on both leg(s).  You may remove them at night for sleeping.   Weight bearing as tolerated   Complete by: As directed      Allergies as of 12/18/2018      Reactions   Atorvastatin Other (See Comments)   Muscle weakness in his legs   Crestor [rosuvastatin] Other (See Comments)   Severe muscle cramping   Zocor [simvastatin] Other (See Comments)   Muscle weakness in his legs      Medication List    STOP taking these medications   HYDROcodone-acetaminophen 5-325 MG tablet Commonly known as: NORCO/VICODIN     TAKE these medications   ALPRAZolam 0.5 MG tablet Commonly known as: XANAX Take 1 tablet (0.5 mg total) by mouth 2 (two) times daily as needed. What changed: reasons to take this   amLODIPine-Valsartan-HCTZ 10-320-25 MG Tabs Take 1 tablet by  mouth daily.   Apple Cider Vinegar 500 MG Tabs Take 500 mg by mouth daily.   aspirin 325 MG EC tablet Take 1 tablet (325 mg total) by mouth 2 (two) times daily for 20 days. Then resume one 81 mg aspirin once a day. What changed:   medication strength  how much to take  when to take this  additional instructions   cetirizine 10 MG tablet Commonly known as: ZYRTEC Take 10 mg by mouth daily.   CINNAMON PO Take 200 mg by mouth daily.   ezetimibe 10 MG tablet Commonly known as: ZETIA Take 1 tablet (10 mg total) by mouth daily. What changed: when to take this   gabapentin 300 MG capsule Commonly known as: NEURONTIN Take 1 capsule (300 mg total) by mouth 3 (three) times daily. Take 300 mg TID x 2 weeks, then 300 mg BID x 2 weeks, then 300 mg QD x 2 weeks   Garlic 2000 MG Caps Take 2,000 mg  by mouth 2 (two) times daily.   Ginkgo Biloba 120 MG Caps Take 120 mg by mouth daily.   methocarbamol 500 MG tablet Commonly known as: ROBAXIN Take 1 tablet (500 mg total) by mouth every 6 (six) hours as needed for muscle spasms.   multivitamin with minerals Tabs tablet Take 1 tablet by mouth daily.   niacin 500 MG CR capsule Take 500 mg by mouth 2 (two) times daily.   omeprazole 20 MG capsule Commonly known as: PRILOSEC Take 1 capsule (20 mg total) by mouth daily. What changed: when to take this   oxyCODONE 5 MG immediate release tablet Commonly known as: Oxy IR/ROXICODONE Take 1-2 tablets (5-10 mg total) by mouth every 6 (six) hours as needed for moderate pain (pain score 4-6).   QC TUMERIC COMPLEX PO Take 1 tablet by mouth daily.   sertraline 25 MG tablet Commonly known as: ZOLOFT TAKE 1 TABLET (25 MG TOTAL) BY MOUTH DAILY. What changed:   how much to take  how to take this  when to take this   sildenafil 100 MG tablet Commonly known as: Viagra Take as directed What changed:   how much to take  how to take this  when to take this  reasons to take this    Systane Ultra 0.4-0.3 % Soln Generic drug: Polyethyl Glycol-Propyl Glycol Place 1 drop into both eyes 4 (four) times daily as needed (dry/irritated eyes.).   testosterone cypionate 200 MG/ML injection Commonly known as: DEPOTESTOSTERONE CYPIONATE Inject 200 mg into the muscle every 14 (fourteen) days. Saturdays            Discharge Care Instructions  (From admission, onward)         Start     Ordered   12/18/18 0000  Weight bearing as tolerated     12/18/18 0751   12/18/18 0000  Change dressing    Comments: Change dressing on Wednesday, then change the dressing daily with sterile 4 x 4 inch gauze dressing and apply TED hose.   12/18/18 0751         Follow-up Information    Ollen GrossAluisio, Frank, MD. Schedule an appointment as soon as possible for a visit on 01/01/2019.   Specialty: Orthopedic Surgery Contact information: [address] [place] [address] [phone]           Signed: Arther AbbottKristie Edmisten, PA-C Orthopedic Surgery 12/19/2018, 11:45 AM

## 2018-12-19 NOTE — Anesthesia Postprocedure Evaluation (Signed)
Anesthesia Post Note  Patient: Dakota Ellison [facility]  Procedure(s) Performed: TOTAL KNEE ARTHROPLASTY (Right Knee)     Patient location during evaluation: PACU Anesthesia Type: Spinal Level of consciousness: oriented, awake and alert and awake Pain management: pain level controlled Vital Signs Assessment: post-procedure vital signs reviewed and stable Respiratory status: spontaneous breathing, respiratory function stable and patient connected to nasal cannula oxygen Cardiovascular status: blood pressure returned to baseline and stable Postop Assessment: no headache, no backache, no apparent nausea or vomiting and spinal receding Anesthetic complications: no    Last Vitals:  Vitals:   12/18/18 0236 12/18/18 0530  BP: 116/75 124/71  Pulse: 83 87  Resp: 18 20  Temp: 36.7 C 36.6 C  SpO2: 96% 97%    Last Pain:  Vitals:   12/18/18 1011  TempSrc:   PainSc: [place]

## 2018-12-20 DIAGNOSIS — R2689 Other abnormalities of gait and mobility: Secondary | ICD-10-CM | POA: Diagnosis not present

## 2018-12-20 DIAGNOSIS — M25561 Pain in right knee: Secondary | ICD-10-CM | POA: Diagnosis not present

## 2018-12-24 DIAGNOSIS — R2689 Other abnormalities of gait and mobility: Secondary | ICD-10-CM | POA: Diagnosis not present

## 2018-12-24 DIAGNOSIS — M25561 Pain in right knee: Secondary | ICD-10-CM | POA: Diagnosis not present

## 2018-12-27 DIAGNOSIS — R2689 Other abnormalities of gait and mobility: Secondary | ICD-10-CM | POA: Diagnosis not present

## 2018-12-27 DIAGNOSIS — M25561 Pain in right knee: Secondary | ICD-10-CM | POA: Diagnosis not present

## 2018-12-31 DIAGNOSIS — R2689 Other abnormalities of gait and mobility: Secondary | ICD-10-CM | POA: Diagnosis not present

## 2018-12-31 DIAGNOSIS — M25561 Pain in right knee: Secondary | ICD-10-CM | POA: Diagnosis not present

## 2019-01-03 DIAGNOSIS — M25561 Pain in right knee: Secondary | ICD-10-CM | POA: Diagnosis not present

## 2019-01-03 DIAGNOSIS — R2689 Other abnormalities of gait and mobility: Secondary | ICD-10-CM | POA: Diagnosis not present

## 2019-01-05 ENCOUNTER — Other Ambulatory Visit: Payer: Self-pay | Admitting: Internal Medicine

## 2019-01-07 NOTE — Telephone Encounter (Signed)
Name of Medication: Hydrocodone Name of Pharmacy: [facility] or Written Date and Quantity: 12-05-18 #90 Last Office Visit and Type: 12-05-18 Next Office Visit and Type: 03-27-19 Last Controlled Substance Agreement Date: 12-05-18 Last UDS: 12-05-18  Pt did receive Oxycodone #56 post surgery 12-18-18 as noted in 12-05-18 OV Note.

## 2019-01-07 NOTE — Telephone Encounter (Signed)
Left detailed message on VM per DPR. 

## 2019-01-07 NOTE — Telephone Encounter (Signed)
He also got another #28 last Wednesday (for a total of 2 extra weeks of Rx) I will not be able to refill his Rx till next week

## 2019-01-07 NOTE — Telephone Encounter (Signed)
Make sure he knows why it was refused and that I can start back next week

## 2019-01-08 DIAGNOSIS — M25561 Pain in right knee: Secondary | ICD-10-CM | POA: Diagnosis not present

## 2019-01-08 DIAGNOSIS — R2689 Other abnormalities of gait and mobility: Secondary | ICD-10-CM | POA: Diagnosis not present

## 2019-01-10 DIAGNOSIS — M25561 Pain in right knee: Secondary | ICD-10-CM | POA: Diagnosis not present

## 2019-01-10 DIAGNOSIS — R2689 Other abnormalities of gait and mobility: Secondary | ICD-10-CM | POA: Diagnosis not present

## 2019-01-14 DIAGNOSIS — R2689 Other abnormalities of gait and mobility: Secondary | ICD-10-CM | POA: Diagnosis not present

## 2019-01-14 DIAGNOSIS — M25561 Pain in right knee: Secondary | ICD-10-CM | POA: Diagnosis not present

## 2019-01-16 DIAGNOSIS — M7732 Calcaneal spur, left foot: Secondary | ICD-10-CM | POA: Diagnosis not present

## 2019-01-16 DIAGNOSIS — M2141 Flat foot [pes planus] (acquired), right foot: Secondary | ICD-10-CM | POA: Diagnosis not present

## 2019-01-16 DIAGNOSIS — M19072 Primary osteoarthritis, left ankle and foot: Secondary | ICD-10-CM | POA: Diagnosis not present

## 2019-01-16 DIAGNOSIS — M2142 Flat foot [pes planus] (acquired), left foot: Secondary | ICD-10-CM | POA: Diagnosis not present

## 2019-01-16 DIAGNOSIS — M79672 Pain in left foot: Secondary | ICD-10-CM | POA: Diagnosis not present

## 2019-01-16 DIAGNOSIS — L603 Nail dystrophy: Secondary | ICD-10-CM | POA: Diagnosis not present

## 2019-01-16 DIAGNOSIS — L608 Other nail disorders: Secondary | ICD-10-CM | POA: Diagnosis not present

## 2019-01-16 DIAGNOSIS — M216X2 Other acquired deformities of left foot: Secondary | ICD-10-CM | POA: Diagnosis not present

## 2019-01-16 DIAGNOSIS — B351 Tinea unguium: Secondary | ICD-10-CM | POA: Diagnosis not present

## 2019-01-16 DIAGNOSIS — L601 Onycholysis: Secondary | ICD-10-CM | POA: Diagnosis not present

## 2019-01-17 DIAGNOSIS — M25561 Pain in right knee: Secondary | ICD-10-CM | POA: Diagnosis not present

## 2019-01-17 DIAGNOSIS — R2689 Other abnormalities of gait and mobility: Secondary | ICD-10-CM | POA: Diagnosis not present

## 2019-01-21 DIAGNOSIS — M25561 Pain in right knee: Secondary | ICD-10-CM | POA: Diagnosis not present

## 2019-01-21 DIAGNOSIS — R2689 Other abnormalities of gait and mobility: Secondary | ICD-10-CM | POA: Diagnosis not present

## 2019-01-22 DIAGNOSIS — Z96651 Presence of right artificial knee joint: Secondary | ICD-10-CM | POA: Diagnosis not present

## 2019-01-25 DIAGNOSIS — M25561 Pain in right knee: Secondary | ICD-10-CM | POA: Diagnosis not present

## 2019-01-25 DIAGNOSIS — R2689 Other abnormalities of gait and mobility: Secondary | ICD-10-CM | POA: Diagnosis not present

## 2019-01-27 ENCOUNTER — Other Ambulatory Visit: Payer: Self-pay

## 2019-01-27 ENCOUNTER — Emergency Department: Payer: PPO

## 2019-01-27 ENCOUNTER — Encounter: Payer: Self-pay | Admitting: Emergency Medicine

## 2019-01-27 ENCOUNTER — Emergency Department
Admission: EM | Admit: 2019-01-27 | Discharge: 2019-01-27 | Disposition: A | Discharge status: Home / Self Care | Payer: PPO | Attending: Student in an Organized Health Care Education/Training Program | Admitting: Student in an Organized Health Care Education/Training Program

## 2019-01-27 DIAGNOSIS — I10 Essential (primary) hypertension: Secondary | ICD-10-CM | POA: Insufficient documentation

## 2019-01-27 DIAGNOSIS — R509 Fever, unspecified: Secondary | ICD-10-CM | POA: Diagnosis not present

## 2019-01-27 DIAGNOSIS — J45909 Unspecified asthma, uncomplicated: Secondary | ICD-10-CM | POA: Insufficient documentation

## 2019-01-27 DIAGNOSIS — B9789 Other viral agents as the cause of diseases classified elsewhere: Secondary | ICD-10-CM | POA: Diagnosis not present

## 2019-01-27 DIAGNOSIS — J069 Acute upper respiratory infection, unspecified: Secondary | ICD-10-CM

## 2019-01-27 DIAGNOSIS — Z888 Allergy status to other drugs, medicaments and biological substances status: Secondary | ICD-10-CM | POA: Insufficient documentation

## 2019-01-27 DIAGNOSIS — Z96652 Presence of left artificial knee joint: Secondary | ICD-10-CM | POA: Diagnosis not present

## 2019-01-27 DIAGNOSIS — Z79899 Other long term (current) drug therapy: Secondary | ICD-10-CM | POA: Insufficient documentation

## 2019-01-27 DIAGNOSIS — Z87891 Personal history of nicotine dependence: Secondary | ICD-10-CM | POA: Insufficient documentation

## 2019-01-27 DIAGNOSIS — U071 COVID-19: Secondary | ICD-10-CM | POA: Insufficient documentation

## 2019-01-27 DIAGNOSIS — R0602 Shortness of breath: Secondary | ICD-10-CM | POA: Diagnosis not present

## 2019-01-27 DIAGNOSIS — R05 Cough: Secondary | ICD-10-CM | POA: Diagnosis not present

## 2019-01-27 LAB — SARS CORONAVIRUS 2 (TAT 6-24 HRS): SARS Coronavirus 2: POSITIVE — AB

## 2019-01-27 MED ORDER — BENZONATATE 100 MG PO CAPS: 200.0000 mg | ORAL_CAPSULE | Freq: Three times a day (TID) | ORAL | 0 refills | Status: DC | PRN

## 2019-01-27 NOTE — ED Provider Notes (Signed)
[facility] Emergency Department Provider Note   ____________________________________________   First MD Initiated Contact with Patient 01/27/19 1308     (approximate)  I have reviewed the triage vital signs and the nursing notes.   HISTORY  Chief Complaint No chief complaint on file.    HPI Dakota Ellison is a 68 y.o. male patient presents with fever and mild dyspnea started yesterday.  Patient is a call his PCP was told to come to ED for COVID testing.  Patient denies nausea, vomiting, diarrhea.  Patient denies recent travel or known exposures COVID-19.  Patient status post 1 month right knee replacement.  Patient is a fever is controlled with Tylenol/ibuprofen.         Past Medical History:  Diagnosis Date  . Anxiety state 04/08/2014   denies  . Complication of anesthesia    and trouble waking up  . Degenerative disc disease   . Dysthymia    denies  . ED (erectile dysfunction)   . GERD (gastroesophageal reflux disease)   . History of MRSA infection    on lip from cut  . Hyperlipemia   . Hypertension    take BP med for small leak in heart to keep it small per pt.  . Mild asthma    as a teen  . Morbid obesity (HCC)   . OSA (obstructive sleep apnea)   . Osteoarthritis   . PONV (postoperative nausea and vomiting)   . Rectal fissure   . Testosterone deficiency     Patient Active Problem List   Diagnosis Date Noted  . Osteoarthritis of right knee 12/17/2018  . Pre-op evaluation 12/05/2018  . Chronic narcotic dependence (HCC) 10/09/2018  . Low back pain 04/11/2015  . Wellness examination 10/07/2014  . Mood disorder (HCC) 10/07/2014  . Impaired glucose tolerance 10/04/2013  . Inguinal hernia unilateral, non-recurrent, left 10/17/2012  . Bladder neck obstruction 10/04/2010  . Obstructive sleep apnea on CPAP 10/06/2008  . RECTAL FISSURE 05/14/2008  . Testosterone deficiency 10/02/2007  . Hyperlipidemia 07/13/2007  . Morbid obesity  (HCC) 07/13/2007  . DYSTHYMIA 07/13/2007  . ERECTILE DYSFUNCTION 07/13/2007  . Essential hypertension 07/13/2007  . Lumbar spinal stenosis 07/13/2007  . Asthma 07/12/2007  . GERD 07/12/2007  . OA (osteoarthritis) of knee 07/12/2007    Past Surgical History:  Procedure Laterality Date  . HERNIA REPAIR     1998  . KNEE SURGERY  09/2010   left knee and right knee arthroscopy  . OTHER SURGICAL HISTORY     arthroscopic subacromial decompression  . OTHER SURGICAL HISTORY     open resection, distal right clavicle  . SHOULDER ARTHROSCOPY     right  . SPHINCTEROTOMY    . TOTAL KNEE ARTHROPLASTY Left 06/11/2018   Procedure: LEFT TOTAL KNEE ARTHROPLASTY;  Surgeon: Ollen GrossAluisio, Frank, MD;  Location: [facility];  Service: Orthopedics;  Laterality: Left;  50min  . TOTAL KNEE ARTHROPLASTY Right 12/17/2018   Procedure: TOTAL KNEE ARTHROPLASTY;  Surgeon: Ollen GrossAluisio, Frank, MD;  Location: [facility];  Service: Orthopedics;  Laterality: Right;  50min    Prior to Admission medications   Medication Sig Start Date End Date Taking? Authorizing Provider  ALPRAZolam Prudy Feeler(XANAX) 0.5 MG tablet Take 1 tablet (0.5 mg total) by mouth 2 (two) times daily as needed. Patient taking differently: Take 0.5 mg by mouth 2 (two) times daily as needed for anxiety.  12/05/18   Karie SchwalbeLetvak, Richard I, MD  amLODIPine-Valsartan-HCTZ 405 117 835810-320-25 MG TABS Take 1 tablet by mouth  daily. 12/05/18   Karie SchwalbeLetvak, Richard I, MD  Apple Cider Vinegar 500 MG TABS Take 500 mg by mouth daily.     [provider]  benzonatate (TESSALON PERLES) 100 MG capsule Take 2 capsules (200 mg total) by mouth 3 (three) times daily as needed. 01/27/19 01/27/20  Joni ReiningSmith,  K, PA-C  cetirizine (ZYRTEC) 10 MG tablet Take 10 mg by mouth daily.    [provider]  CINNAMON PO Take 200 mg by mouth daily.    [provider]  ezetimibe (ZETIA) 10 MG tablet Take 1 tablet (10 mg total) by mouth daily. Patient taking differently: Take 10 mg by mouth every evening.   12/05/18   Karie SchwalbeLetvak, Richard I, MD  gabapentin (NEURONTIN) 300 MG capsule Take 1 capsule (300 mg total) by mouth 3 (three) times daily. Take 300 mg TID x 2 weeks, then 300 mg BID x 2 weeks, then 300 mg QD x 2 weeks 12/18/18   Edmisten, Kristie L, PA  Garlic 2000 MG CAPS Take 2,000 mg by mouth 2 (two) times daily.     [provider]  Ginkgo Biloba 120 MG CAPS Take 120 mg by mouth daily.     [provider]  methocarbamol (ROBAXIN) 500 MG tablet Take 1 tablet (500 mg total) by mouth every 6 (six) hours as needed for muscle spasms. 12/18/18   Edmisten, Lyn HollingsheadKristie L, PA  Multiple Vitamin (MULTIVITAMIN WITH MINERALS) TABS tablet Take 1 tablet by mouth daily.    [provider]  niacin 500 MG CR capsule Take 500 mg by mouth 2 (two) times daily.     [provider]  omeprazole (PRILOSEC) 20 MG capsule Take 1 capsule (20 mg total) by mouth daily. Patient taking differently: Take 20 mg by mouth every evening.  12/05/18   Karie SchwalbeLetvak, Richard I, MD  oxyCODONE (OXY IR/ROXICODONE) 5 MG immediate release tablet Take 1-2 tablets (5-10 mg total) by mouth every 6 (six) hours as needed for moderate pain (pain score 4-6). 12/18/18   Edmisten, Kristie L, PA  sertraline (ZOLOFT) 25 MG tablet TAKE 1 TABLET (25 MG TOTAL) BY MOUTH DAILY. Patient taking differently: Take 25 mg by mouth every evening. TAKE 1 TABLET (25 MG TOTAL) BY MOUTH DAILY. 12/05/18   Karie SchwalbeLetvak, Richard I, MD  sildenafil (VIAGRA) 100 MG tablet Take as directed Patient taking differently: Take 100 mg by mouth daily as needed for erectile dysfunction. Take as directed 10/03/11   Michele McalpineNadel, Scott M, MD  SYSTANE ULTRA 0.4-0.3 % SOLN Place 1 drop into both eyes 4 (four) times daily as needed (dry/irritated eyes.).    [provider]  testosterone cypionate (DEPOTESTOSTERONE CYPIONATE) 200 MG/ML injection Inject 200 mg into the muscle every 14 (fourteen) days. Saturdays 12/03/18   [provider]  Turmeric (QC TUMERIC COMPLEX PO)  Take 1 tablet by mouth daily.    [provider]    Allergies Atorvastatin, Crestor [rosuvastatin], and Zocor [simvastatin]  Family History  Problem Relation Age of Onset  . Pneumonia Brother     Social History Social History   Tobacco Use  . Smoking status: Former Smoker    Types: Cigars    Quit date: 06/07/2011    Years since quitting: 7.6  . Smokeless tobacco: Never Used  . Tobacco comment: CIGARS ONLY--QUIT 2013  Substance Use Topics  . Alcohol use: Yes    Alcohol/week: 0.0 standard drinks    Comment: rare//occassional  . Drug use: No    Review of Systems  Constitutional:  No fever/chills Eyes: No visual changes. ENT: No sore throat. Cardiovascular: Denies chest pain. Respiratory: Denies shortness of breath. Gastrointestinal: No abdominal pain.  No nausea, no vomiting.  No diarrhea.  No constipation. Genitourinary: Negative for dysuria. Musculoskeletal: Negative for back pain. Skin: Negative for rash. Neurological: Negative for headaches, focal weakness or numbness. Psychiatric:  Anxiety Endocrine:  Hyperlipidemia and hypertension Allergic/Immunilogical: Statins ____________________________________________   PHYSICAL EXAM:  VITAL SIGNS: ED Triage Vitals  Enc Vitals Group     BP 01/27/19 1226 125/64     Pulse Rate 01/27/19 1226 90     Resp 01/27/19 1226 18     Temp 01/27/19 1226 98.7 F (37.1 C)     Temp Source 01/27/19 1226 Oral     SpO2 01/27/19 1226 96 %     Weight 01/27/19 1228 275 lb (124.7 kg)     Height 01/27/19 1228 6' (1.829 m)     Head Circumference --      Peak Flow --      Pain Score 01/27/19 1227 0     Pain Loc --      Pain Edu? --      Excl. in GC? --     Constitutional: Alert and oriented. Well appearing and in no acute distress. Cardiovascular: Normal rate, regular rhythm. Grossly normal heart sounds.  Good peripheral circulation. Respiratory: Normal respiratory effort.  No retractions. Lungs CTAB. Musculoskeletal: No  lower extremity tenderness nor edema.  No joint effusions. Neurologic:  Normal speech and language. No gross focal neurologic deficits are appreciated. No gait instability. Skin:  Skin is warm, dry and intact. No rash noted. Psychiatric: Mood and affect are normal. Speech and behavior are normal.  ____________________________________________   LABS (all labs ordered are listed, but only abnormal results are displayed)  Labs Reviewed  SARS CORONAVIRUS 2   ____________________________________________  EKG   ____________________________________________  RADIOLOGY  ED MD interpretation:    Official radiology report(s): Dg Chest Portable 1 View  Result Date: 01/27/2019 CLINICAL DATA:  Weakness, fever, shortness of breath, dry cough EXAM: PORTABLE CHEST 1 VIEW COMPARISON:  Chest radiographs dated 10/08/2015 FINDINGS: Lungs are clear.  No pleural effusion or pneumothorax. The heart is top-normal in size. Degenerative changes of the visualized thoracolumbar spine. IMPRESSION: No evidence of acute cardiopulmonary disease. Electronically Signed   By: Charline BillsSriyesh  Krishnan M.D.   On: 01/27/2019 13:47    ____________________________________________   PROCEDURES  Procedure(s) performed (including Critical Care):  Procedures   ____________________________________________   INITIAL IMPRESSION / ASSESSMENT AND PLAN / ED COURSE  As part of my medical decision making, I reviewed the following data within the electronic medical rec  Dakota Ellison was evaluated in Emergency Department on 01/27/2019 for the symptoms described in the history of present illness. He was evaluated in the context of the global COVID-19 pandemic, which necessitated consideration that the patient might be at risk for infection with the SARS-CoV-2 virus that causes COVID-19. Institutional protocols and algorithms that pertain to the evaluation of patients at risk for COVID-19 are in a state of rapid change based on  information released by regulatory bodies including the CDC and federal and state organizations. These policies and algorithms were followed during the patient's care in the ED.    Patient presents with URI signs and symptoms of concern for COVID 19.  Discussed negative chest x-ray findings with patient.  Patient advised self quarantine pending COVID test results.      ____________________________________________   FINAL CLINICAL IMPRESSION(S) /  ED DIAGNOSES  Final diagnoses:  Viral upper respiratory tract infection with cough     ED Discharge Orders         Ordered    benzonatate (TESSALON PERLES) 100 MG capsule  3 times daily PRN     01/27/19 1506           Note:  This document was prepared using Dragon voice recognition software and may include unintentional dictation errors.    Sable Feil, PA-C 01/27/19 1512    Merlyn Lot, MD 01/27/19 1535

## 2019-01-27 NOTE — ED Notes (Signed)
Pt states he has weakness starting yesterday, fever 103.1 per pt. This am fever of 101. Pt has slight SOB, and dry cough.

## 2019-01-27 NOTE — ED Triage Notes (Signed)
Pt to ED with c/o of fever and SOB that started yesterday. Pt called PCP and was told to come to ED for COVID test. Pt denies N/V/D.

## 2019-01-27 NOTE — Discharge Instructions (Addendum)
Advised self quarantine pending COVID-19 test results. °

## 2019-01-28 ENCOUNTER — Telehealth: Payer: Self-pay | Admitting: Internal Medicine

## 2019-01-28 NOTE — Telephone Encounter (Signed)
Spoke to pt. Advised him that he needs to contact anyone he was in contact with the last 2 weeks and let them know he tested positive. Advised him that if he starts to have shortness of breath or difficulty breathing to go straight to the ER.

## 2019-01-28 NOTE — Telephone Encounter (Signed)
Please call him The COVID test was positive--please go ahead with report to health department He needs to stay strictly quarantined and all people he was exposed to should be checked (and also quarantine pending results)  Mandy, please put him on the COVID follow up protocol

## 2019-01-28 NOTE — Telephone Encounter (Signed)
Patient stated that he was sent to the ED yesterday and was advised to call to give update And wanted to make sure you seen his ED notes.   Patient stated he was tested for Covid and had a xray done for pneumonia while there Stated that he is at home resting and taking medication to keep the fever down and waiting on his covid results to come back    C/B # [phone]

## 2019-01-29 MED ORDER — HYDROCODONE-HOMATROPINE 5-1.5 MG/5ML PO SYRP: 5.0000 mL | ORAL_SOLUTION | Freq: Every evening | ORAL | 0 refills | Status: DC | PRN

## 2019-01-29 NOTE — Telephone Encounter (Signed)
None of those treatments has proven value. I did send a narcotic cough syrup to [place] for him

## 2019-01-29 NOTE — Telephone Encounter (Signed)
Spoke to pt. He was thankful for the new cough medication. He is really concerned that he has Covid because of everything he has seen and heard on TV. I reassured him that rest is the best and if his symptoms change, he needs to seek help.

## 2019-01-29 NOTE — Telephone Encounter (Signed)
[facility] Night - Client Nonclinical Telephone Record AccessNurse Client [place] Night - Client Client Site [facility] Physician Viviana Simpler - MD Contact Type Call Who Is Calling Patient / Member / Family / Caregiver Caller Name [place] Phone Number [phone] Call Type Message Only Information Provided Reason for Call Returning a Call from the Office Initial [place] states he missed a call from the office about his test results. Additional Comment Call Closed By: Roxanna Mew Transaction Date/Time: 01/28/2019 5:07:03 PM (ET)

## 2019-01-29 NOTE — Telephone Encounter (Signed)
Patient added to home monitoring call log.

## 2019-01-29 NOTE — Telephone Encounter (Signed)
Spoke to pt. His friends and family are telling him to ask why he is not getting a Z-Pac, hydroxychloroquine, and Zinc. I advised him that his xray was clear so a Z-Pac would not do anything for a virus. I told him the hydroxychloroquine is mostly used in the hospital setting. I told him Zinc is available OTC. He said all he is doing is taking Tylenol and Ibuprofen. I told him that was the best option for now and to rest. If his symptoms change, go to ER.  He said his cough is dry and the [employer] are not helping. Asking if he can have something else for cough.

## 2019-01-29 NOTE — Telephone Encounter (Signed)
Patient called and asked for Larene [place] to call him back at [phone].

## 2019-02-01 ENCOUNTER — Telehealth: Payer: Self-pay | Admitting: *Deleted

## 2019-02-01 NOTE — Telephone Encounter (Signed)
Okay Please continue to monitor him

## 2019-02-01 NOTE — Telephone Encounter (Signed)
Spoke to patient and was advised that he felt better Wednesday, but after being up for a while today his temperature was 99.9. Patient stated that he is alternating between Advil and Tylenol for his fever.  Patient stated that he is just resting and hopes this is over soon. Advised patient to stay hydrated, rest and eat well and he verbalized understanding. Advised patient that if he does not continue to improve to let Dr. Silvio Pate know. Advised patient if he develops any SOB or difficulty breathing he should go to the ER and he verbalized understanding.

## 2019-02-01 NOTE — Telephone Encounter (Signed)
Patient returned Dakota Ellison's call.  Patient can be reached at [phone].

## 2019-02-01 NOTE — Telephone Encounter (Signed)
Tried to call patient to get an update on him since he had a Covid 19 positive result. Left message on voicemail for patient to call back.

## 2019-02-04 ENCOUNTER — Ambulatory Visit: Payer: PPO | Admitting: Internal Medicine

## 2019-02-04 ENCOUNTER — Emergency Department
Admission: EM | Admit: 2019-02-04 | Discharge: 2019-02-04 | Disposition: A | Discharge status: Home / Self Care | Payer: PPO | Attending: Emergency Medicine | Admitting: Emergency Medicine

## 2019-02-04 ENCOUNTER — Other Ambulatory Visit: Payer: Self-pay

## 2019-02-04 DIAGNOSIS — I10 Essential (primary) hypertension: Secondary | ICD-10-CM | POA: Insufficient documentation

## 2019-02-04 DIAGNOSIS — Z79899 Other long term (current) drug therapy: Secondary | ICD-10-CM | POA: Diagnosis not present

## 2019-02-04 DIAGNOSIS — A09 Infectious gastroenteritis and colitis, unspecified: Secondary | ICD-10-CM

## 2019-02-04 DIAGNOSIS — U071 COVID-19: Secondary | ICD-10-CM | POA: Insufficient documentation

## 2019-02-04 DIAGNOSIS — R197 Diarrhea, unspecified: Secondary | ICD-10-CM | POA: Diagnosis not present

## 2019-02-04 DIAGNOSIS — Z87891 Personal history of nicotine dependence: Secondary | ICD-10-CM | POA: Diagnosis not present

## 2019-02-04 DIAGNOSIS — J45909 Unspecified asthma, uncomplicated: Secondary | ICD-10-CM | POA: Insufficient documentation

## 2019-02-04 DIAGNOSIS — E860 Dehydration: Secondary | ICD-10-CM | POA: Diagnosis not present

## 2019-02-04 DIAGNOSIS — R509 Fever, unspecified: Secondary | ICD-10-CM | POA: Diagnosis present

## 2019-02-04 LAB — LIPASE, BLOOD: Lipase: 30 U/L (ref 11–51)

## 2019-02-04 LAB — URINALYSIS, COMPLETE (UACMP) WITH MICROSCOPIC
Bacteria, UA: NONE SEEN
Bilirubin Urine: NEGATIVE
Glucose, UA: NEGATIVE mg/dL
Hgb urine dipstick: NEGATIVE
Ketones, ur: NEGATIVE mg/dL
Leukocytes,Ua: NEGATIVE
Nitrite: NEGATIVE
Protein, ur: NEGATIVE mg/dL
Specific Gravity, Urine: 1.015 (ref 1.005–1.030)
pH: 5 (ref 5.0–8.0)

## 2019-02-04 LAB — COMPREHENSIVE METABOLIC PANEL
ALT: 29 U/L (ref 0–44)
AST: 31 U/L (ref 15–41)
Albumin: 4.3 g/dL (ref 3.5–5.0)
Alkaline Phosphatase: 99 U/L (ref 38–126)
Anion gap: 12 (ref 5–15)
BUN: 16 mg/dL (ref 8–23)
CO2: 23 mmol/L (ref 22–32)
Calcium: 9.3 mg/dL (ref 8.9–10.3)
Chloride: 100 mmol/L (ref 98–111)
Creatinine, Ser: 1.04 mg/dL (ref 0.61–1.24)
GFR calc Af Amer: >60 mL/min (ref >60)
GFR calc non Af Amer: >60 mL/min (ref >60)
Glucose, Bld: 101 mg/dL — ABNORMAL HIGH (ref 70–99)
Potassium: 3.7 mmol/L (ref 3.5–5.1)
Sodium: 135 mmol/L (ref 135–145)
Total Bilirubin: 0.8 mg/dL (ref 0.3–1.2)
Total Protein: 7.9 g/dL (ref 6.5–8.1)

## 2019-02-04 LAB — CBC
HCT: 45.7 % (ref 39.0–52.0)
Hemoglobin: 15.6 g/dL (ref 13.0–17.0)
MCH: 27.9 pg (ref 26.0–34.0)
MCHC: 34.1 g/dL (ref 30.0–36.0)
MCV: 81.8 fL (ref 80.0–100.0)
Platelets: 201 10*3/uL (ref 150–400)
RBC: 5.59 MIL/uL (ref 4.22–5.81)
RDW: 13.6 % (ref 11.5–15.5)
WBC: 5.9 10*3/uL (ref 4.0–10.5)
nRBC: 0 % (ref 0.0–0.2)

## 2019-02-04 MED ORDER — DEXAMETHASONE SODIUM PHOSPHATE 10 MG/ML IJ SOLN
10.0000 mg | Freq: Once | INTRAMUSCULAR | Status: AC
  Administered 2019-02-04: 10 mg via INTRAVENOUS
  Filled 2019-02-04: qty 1

## 2019-02-04 MED ORDER — PREDNISONE 10 MG (21) PO TBPK: ORAL_TABLET | ORAL | 0 refills | Status: DC

## 2019-02-04 MED ORDER — ONDANSETRON 4 MG PO TBDP: 4.0000 mg | ORAL_TABLET | Freq: Three times a day (TID) | ORAL | 0 refills | Status: DC | PRN

## 2019-02-04 MED ORDER — LOPERAMIDE HCL 2 MG PO TABS: 4.0000 mg | ORAL_TABLET | Freq: Four times a day (QID) | ORAL | 0 refills | Status: DC | PRN

## 2019-02-04 MED ORDER — SODIUM CHLORIDE 0.9 % IV BOLUS
2000.0000 mL | Freq: Once | INTRAVENOUS | Status: AC
  Administered 2019-02-04: 15:00:00 2000 mL via INTRAVENOUS

## 2019-02-04 NOTE — Telephone Encounter (Signed)
I will follow up based on how the ER evaluation goes

## 2019-02-04 NOTE — ED Triage Notes (Signed)
Reports dx with COVID X 1 week ago, has been sick since 8/22. Reports fevers that are not under control and continued diarrhea. Concerned for dehydration.

## 2019-02-04 NOTE — ED Notes (Signed)
Pt d/c instructions reviewed per MD. Pt in NAD. VSS. Pt ambulatory.

## 2019-02-04 NOTE — ED Notes (Signed)
Pt in NAD.  VSS. Will continue to monitor for further needs.

## 2019-02-04 NOTE — ED Provider Notes (Signed)
[facility] Emergency Department Provider Note  ____________________________________________  Time seen: Approximately 3:07 PM  I have reviewed the triage vital signs and the nursing notes.   HISTORY  Chief Complaint Other (COVID), Fever, and Diarrhea    HPI Dakota MannGary W Ellison is a 68 y.o. male with a history of anxiety, GERD, hypertension, obesity who was diagnosed with COVID 10 days ago.  He reports during this time he has had intermittent fevers, fatigue, altered sense of taste.  He is also had frequent watery diarrhea and is now feeling like he gets dizzy when he stands up.  He is worried that he is dehydrated.  He denies any chest pain shortness of breath or cough.  Symptoms are constant, waxing and waning, no aggravating or alleviating factors.     Past Medical History:  Diagnosis Date  . Anxiety state 04/08/2014   denies  . Complication of anesthesia    and trouble waking up  . Degenerative disc disease   . Dysthymia    denies  . ED (erectile dysfunction)   . GERD (gastroesophageal reflux disease)   . History of MRSA infection    on lip from cut  . Hyperlipemia   . Hypertension    take BP med for small leak in heart to keep it small per pt.  . Mild asthma    as a teen  . Morbid obesity (HCC)   . OSA (obstructive sleep apnea)   . Osteoarthritis   . PONV (postoperative nausea and vomiting)   . Rectal fissure   . Testosterone deficiency      Patient Active Problem List   Diagnosis Date Noted  . Osteoarthritis of right knee 12/17/2018  . Pre-op evaluation 12/05/2018  . Chronic narcotic dependence (HCC) 10/09/2018  . Low back pain 04/11/2015  . Wellness examination 10/07/2014  . Mood disorder (HCC) 10/07/2014  . Impaired glucose tolerance 10/04/2013  . Inguinal hernia unilateral, non-recurrent, left 10/17/2012  . Bladder neck obstruction 10/04/2010  . Obstructive sleep apnea on CPAP 10/06/2008  . RECTAL FISSURE 05/14/2008  . Testosterone  deficiency 10/02/2007  . Hyperlipidemia 07/13/2007  . Morbid obesity (HCC) 07/13/2007  . DYSTHYMIA 07/13/2007  . ERECTILE DYSFUNCTION 07/13/2007  . Essential hypertension 07/13/2007  . Lumbar spinal stenosis 07/13/2007  . Asthma 07/12/2007  . GERD 07/12/2007  . OA (osteoarthritis) of knee 07/12/2007     Past Surgical History:  Procedure Laterality Date  . HERNIA REPAIR     1998  . KNEE SURGERY  09/2010   left knee and right knee arthroscopy  . OTHER SURGICAL HISTORY     arthroscopic subacromial decompression  . OTHER SURGICAL HISTORY     open resection, distal right clavicle  . SHOULDER ARTHROSCOPY     right  . SPHINCTEROTOMY    . TOTAL KNEE ARTHROPLASTY Left 06/11/2018   Procedure: LEFT TOTAL KNEE ARTHROPLASTY;  Surgeon: Ollen GrossAluisio, Frank, MD;  Location: [facility];  Service: Orthopedics;  Laterality: Left;  50min  . TOTAL KNEE ARTHROPLASTY Right 12/17/2018   Procedure: TOTAL KNEE ARTHROPLASTY;  Surgeon: Ollen GrossAluisio, Frank, MD;  Location: [facility];  Service: Orthopedics;  Laterality: Right;  50min     Prior to Admission medications   Medication Sig Start Date End Date Taking? Authorizing Provider  ALPRAZolam Prudy Feeler(XANAX) 0.5 MG tablet Take 1 tablet (0.5 mg total) by mouth 2 (two) times daily as needed. Patient taking differently: Take 0.5 mg by mouth 2 (two) times daily as needed for anxiety.  12/05/18   Alphonsus SiasLetvak,  Berneda Roseichard I, MD  amLODIPine-Valsartan-HCTZ [id number] MG TABS Take 1 tablet by mouth daily. 12/05/18   Karie SchwalbeLetvak, Richard I, MD  Apple Cider Vinegar 500 MG TABS Take 500 mg by mouth daily.     [provider]  benzonatate (TESSALON PERLES) 100 MG capsule Take 2 capsules (200 mg total) by mouth 3 (three) times daily as needed. 01/27/19 01/27/20  Joni ReiningSmith, Ronald K, PA-C  cetirizine (ZYRTEC) 10 MG tablet Take 10 mg by mouth daily.    [provider]  CINNAMON PO Take 200 mg by mouth daily.    [provider]  ezetimibe (ZETIA) 10 MG tablet Take 1 tablet (10 mg total) by mouth  daily. Patient taking differently: Take 10 mg by mouth every evening.  12/05/18   Karie SchwalbeLetvak, Richard I, MD  gabapentin (NEURONTIN) 300 MG capsule Take 1 capsule (300 mg total) by mouth 3 (three) times daily. Take 300 mg TID x 2 weeks, then 300 mg BID x 2 weeks, then 300 mg QD x 2 weeks 12/18/18   Edmisten, Kristie L, PA  Garlic 2000 MG CAPS Take 2,000 mg by mouth 2 (two) times daily.     [provider]  Ginkgo Biloba 120 MG CAPS Take 120 mg by mouth daily.     [provider]  HYDROcodone-homatropine (HYCODAN) 5-1.5 MG/5ML syrup Take 5 mLs by mouth at bedtime as needed for cough. 01/29/19   Karie SchwalbeLetvak, Richard I, MD  loperamide (IMODIUM A-D) 2 MG tablet Take 2 tablets (4 mg total) by mouth 4 (four) times daily as needed for diarrhea or loose stools. 02/04/19   Sharman CheekStafford, Saleena Tamas, MD  methocarbamol (ROBAXIN) 500 MG tablet Take 1 tablet (500 mg total) by mouth every 6 (six) hours as needed for muscle spasms. 12/18/18   Edmisten, Lyn HollingsheadKristie L, PA  Multiple Vitamin (MULTIVITAMIN WITH MINERALS) TABS tablet Take 1 tablet by mouth daily.    [provider]  niacin 500 MG CR capsule Take 500 mg by mouth 2 (two) times daily.     [provider]  omeprazole (PRILOSEC) 20 MG capsule Take 1 capsule (20 mg total) by mouth daily. Patient taking differently: Take 20 mg by mouth every evening.  12/05/18   Karie SchwalbeLetvak, Richard I, MD  ondansetron (ZOFRAN ODT) 4 MG disintegrating tablet Take 1 tablet (4 mg total) by mouth every 8 (eight) hours as needed for nausea or vomiting. 02/04/19   Sharman CheekStafford, Phyllis Abelson, MD  oxyCODONE (OXY IR/ROXICODONE) 5 MG immediate release tablet Take 1-2 tablets (5-10 mg total) by mouth every 6 (six) hours as needed for moderate pain (pain score 4-6). 12/18/18   Edmisten, Kristie L, PA  predniSONE (STERAPRED UNI-PAK 21 TAB) 10 MG (21) TBPK tablet 6 tablets on day 1, then 5 tablets on day 2, then 4 tablets on day 3, then 3 tablets on day 4, then 2 tablets on day 5, then 1 tablet on  day 6. 02/04/19   Sharman CheekStafford, Neyra Pettie, MD  sertraline (ZOLOFT) 25 MG tablet TAKE 1 TABLET (25 MG TOTAL) BY MOUTH DAILY. Patient taking differently: Take 25 mg by mouth every evening. TAKE 1 TABLET (25 MG TOTAL) BY MOUTH DAILY. 12/05/18   Karie SchwalbeLetvak, Richard I, MD  sildenafil (VIAGRA) 100 MG tablet Take as directed Patient taking differently: Take 100 mg by mouth daily as needed for erectile dysfunction. Take as directed 10/03/11   Michele McalpineNadel, Scott M, MD  SYSTANE ULTRA 0.4-0.3 % SOLN Place 1 drop into both eyes 4 (four) times daily as needed (dry/irritated eyes.).  [provider]  testosterone cypionate (DEPOTESTOSTERONE CYPIONATE) 200 MG/ML injection Inject 200 mg into the muscle every 14 (fourteen) days. Saturdays 12/03/18   [provider]  Turmeric (QC TUMERIC COMPLEX PO) Take 1 tablet by mouth daily.    [provider]     Allergies Atorvastatin, Crestor [rosuvastatin], and Zocor [simvastatin]   Family History  Problem Relation Age of Onset  . Pneumonia Brother     Social History Social History   Tobacco Use  . Smoking status: Former Smoker    Types: Cigars    Quit date: 06/07/2011    Years since quitting: 7.6  . Smokeless tobacco: Never Used  . Tobacco comment: CIGARS ONLY--QUIT 2013  Substance Use Topics  . Alcohol use: Yes    Alcohol/week: 0.0 standard drinks    Comment: rare//occassional  . Drug use: No    Review of Systems  Constitutional: Positive fever and chills.  ENT:   No sore throat. No rhinorrhea. Cardiovascular:   No chest pain or syncope. Respiratory:   No dyspnea or cough. Gastrointestinal:   Positive generalized abdominal pain with diarrhea, no vomiting..  Musculoskeletal:   Negative for focal pain or swelling All other systems reviewed and are negative except as documented above in ROS and HPI.  ____________________________________________   PHYSICAL EXAM:  VITAL SIGNS: ED Triage Vitals  Enc Vitals Group     BP 02/04/19 1200  100/70     Pulse Rate 02/04/19 1200 93     Resp 02/04/19 1200 20     Temp 02/04/19 1200 98.7 F (37.1 C)     Temp Source 02/04/19 1200 Oral     SpO2 02/04/19 1200 94 %     Weight 02/04/19 1201 273 lb 5.9 oz (124 kg)     Height 02/04/19 1201 6' (1.829 m)     Head Circumference --      Peak Flow --      Pain Score 02/04/19 1200 8     Pain Loc --      Pain Edu? --      Excl. in GC? --     Vital signs reviewed, nursing assessments reviewed.   Constitutional:   Alert and oriented. Non-toxic appearance. Eyes:   Conjunctivae are normal. EOMI. PERRL. ENT      Head:   Normocephalic and atraumatic.      Nose:   No congestion/rhinnorhea.       Mouth/Throat:   Dry mucous membranes, no pharyngeal erythema. No peritonsillar mass.       Neck:   No meningismus. Full ROM. Hematological/Lymphatic/Immunilogical:   No cervical lymphadenopathy. Cardiovascular:   RRR. Symmetric bilateral radial and DP pulses.  No murmurs. Cap refill less than 2 seconds. Respiratory:   Normal respiratory effort without tachypnea/retractions. Breath sounds are clear and equal bilaterally. No wheezes/rales/rhonchi. Gastrointestinal:   Soft and nontender. Non distended. There is no CVA tenderness.  No rebound, rigidity, or guarding.  Musculoskeletal:   Normal range of motion in all extremities. No joint effusions.  No lower extremity tenderness.  No edema. Neurologic:   Normal speech and language.  Motor grossly intact. No acute focal neurologic deficits are appreciated.  Skin:    Skin is warm, dry and intact. No rash noted.  No petechiae, purpura, or bullae.  ____________________________________________    LABS (pertinent positives/negatives) (all labs ordered are listed, but only abnormal results are displayed) Labs Reviewed  COMPREHENSIVE METABOLIC PANEL - Abnormal; Notable for the following components:  Result Value   Glucose, Bld 101 (*)    All other components within normal limits  URINALYSIS,  COMPLETE (UACMP) WITH MICROSCOPIC - Abnormal; Notable for the following components:   Color, Urine YELLOW (*)    APPearance HAZY (*)    All other components within normal limits  LIPASE, BLOOD  CBC   ____________________________________________   EKG    ____________________________________________    RADIOLOGY  No results found.  ____________________________________________   PROCEDURES Procedures  ____________________________________________    CLINICAL IMPRESSION / ASSESSMENT AND PLAN / ED COURSE  Medications ordered in the ED: Medications  sodium chloride 0.9 % bolus 2,000 mL (2,000 mLs Intravenous New Bag/Given 02/04/19 1447)  dexamethasone (DECADRON) injection 10 mg (10 mg Intravenous Given 02/04/19 1443)    Pertinent labs & imaging results that were available during my care of the patient were reviewed by me and considered in my medical decision making (see chart for details).  Dakota Ellison was evaluated in Emergency Department on 02/04/2019 for the symptoms described in the history of present illness. He was evaluated in the context of the global COVID-19 pandemic, which necessitated consideration that the patient might be at risk for infection with the SARS-CoV-2 virus that causes COVID-19. Institutional protocols and algorithms that pertain to the evaluation of patients at risk for COVID-19 are in a state of rapid change based on information released by regulatory bodies including the CDC and federal and state organizations. These policies and algorithms were followed during the patient's care in the ED.   Patient presents with fatigue, diarrhea, fever which all appears to be a viral syndrome in the setting of his COVID-19 diagnosis.  No evidence of any cardiopulmonary or vascular complications.  Vital signs are unremarkable.  Labs are unremarkable.  Exam is reassuring.  Clinically he is dehydrated.  I will give IV fluids for supportive care and symptom relief.  Also  recommend starting a course of steroids as this will help him at this point symptomatically, NSAIDs as needed, Imodium as needed.    Considering the patient's symptoms, medical history, and physical examination today, I have low suspicion for cholecystitis or biliary pathology, pancreatitis, perforation or bowel obstruction, hernia, intra-abdominal abscess, AAA or dissection, volvulus or intussusception, mesenteric ischemia, or appendicitis.   ----------------------------------------- 5:21 PM on 02/04/2019 -----------------------------------------  Remained stable.  Vital signs normal.  Tolerating oral intake.  Can discharge with further supportive care with oral medications.       ____________________________________________   FINAL CLINICAL IMPRESSION(S) / ED DIAGNOSES    Final diagnoses:  Diarrhea of infectious origin  Dehydration  COVID-19 virus infection     ED Discharge Orders         Ordered    predniSONE (STERAPRED UNI-PAK 21 TAB) 10 MG (21) TBPK tablet     02/04/19 1720    ondansetron (ZOFRAN ODT) 4 MG disintegrating tablet  Every 8 hours PRN     02/04/19 1720    loperamide (IMODIUM A-D) 2 MG tablet  4 times daily PRN     02/04/19 1720          Portions of this note were generated with dragon dictation software. Dictation errors may occur despite best attempts at proofreading.   Carrie Mew, MD 02/04/19 1721

## 2019-02-04 NOTE — ED Notes (Signed)
Patient reports being diagnosed with covid19 approx. 1 week ago.  States he has been taking tylenol and advil for fever but symptoms are not improving.  Reports feeling very weak.  Patient has had diarrhea and fever, denies shortness of breath.

## 2019-02-04 NOTE — Telephone Encounter (Signed)
Pt already has  scheduled virtual visit 02/04/19 at 2:30 for pt is + covid testing and feeling really weak and wants to know what to do about fever. Temp now is 100.9; pt has been taking advil 800 mg q 8 hr. Pt is taking tylenol 650 mg 2 tabs q 8 h instead of advil on and off; pt is very weak and is all he can do to go to bathroom. Pt doing a lot of sweating and watery diarrhea continues.had symptoms for 10 days. Pt is having soreness in abd area, dry mouth, pt is voiding smaller amt than usual and dizziness. Pt cancelled appt with Dr Silvio Pate this afternoon and will go to [facility] ED for eval and possible IV fluids due to possible dehydration. I spoke with Tiffany charge nurse at [facility] ED and let her know pt is coming to ED; Tiffany advised for pt to wear mask and she would be on look out for pt. Pt voiced understanding and will go to ED now; cancelled appt with Dr Silvio Pate this afternoon; pt appreciative. FYI to Dr Silvio Pate.

## 2019-02-06 ENCOUNTER — Telehealth: Payer: Self-pay

## 2019-02-06 NOTE — Telephone Encounter (Signed)
Spoke with patient.  He said he tried the melatonin last night and it didn't help; however, this was not mentioned earlier.   He states he will continue to try taking it.

## 2019-02-06 NOTE — Telephone Encounter (Signed)
He can try some melatonin up to 10mg  to see if that helps Otherwise, I am concerned about too much meds (for other hypnotics)

## 2019-02-06 NOTE — Telephone Encounter (Signed)
Spoke with patient for an update.  He states that overall he sees some improvement since being seen in the ER on Monday, but still feels pretty poor.  They gave him fluids and prednisone in the ER and then discharged him home on a steroid taper pack.   His fever broke yesterday am and he has remained afebrile with nothing higher than a 99.2 temp since.  Diarrhea has also improved.  He denies any SOB and states that cough is minimal and he is on the hycodan prn.   He is very weak and reinforcement given to increase his fluid intake, take his medications and get plenty of rest.   He is having trouble sleeping since starting the prednisone and wonders if there is anything that Dr. Silvio Pate would recommend that he could try?  He stated he did take they Hycodan before bedtime last night at 11pm but couldn't fall asleep until close to 5 this am.   Dr. Silvio Pate, please advise.   I will continue check in calls with patient this Friday and Monday and patient is aware to call us sooner if any questions or concerns and to go to ER if develops any urgent, worsening of his symptoms including dyspnea and or chest pain.   Patient verbalizes understanding and thanks [place] for the call.  He also wants to make sure we thank Ozzie Hoyle, LPN, triage nurse who assisted him on Monday.  He states that she was wonderful and very supportive and made a huge difference in his outcome with her recommendations.  He wants to be sure she is thanked.  I assured him that I would make her aware.

## 2019-02-08 ENCOUNTER — Telehealth: Payer: Self-pay

## 2019-02-08 NOTE — Telephone Encounter (Signed)
Spoke with patient.  He reports the following:  1.  Has not ran a fever since Tuesday.  Temp today 98.6 2.  Cough has improved 3.  Still feeling weak, not sleeping well.  (is taking the melatonin).  4.  Awful taste in mouth, but knows this can continue for a while. 5.  No longer has diarrhea but BM's are not exactly back to normal yet.     Overall, there is noted improvement since earlier in the week.  He denies any SOB, chest tightness or congestion.   Patient is afraid that his symptoms will return after he completes his prednisone on Sunday and wants to know if he should have more medication.   I instructed him that as his symptoms are improving, this is a good sign.  We would expect that his symptoms should continue to improve and that the prednisone will still be working in his body even after he finishes his last dose.   As far as requiring further treatment, this really depends on his symptoms and how he is presenting.  There is nothing different to recommend to him at this point but to continue to take his medications, increase fluid intake and rest.  However, if his weakness progresses and/or he develops concerning symptoms over the weekend, he can call our on call service for guidance or go to the ER if symptoms are severe.  Our expectation; however, is that he should continue to improve from here.   Patient given opportunity to ask further questions.  He has no further questions or concerns at this time and will continue to monitor his fever and symptoms and keep hydrated.      Patient knows that I will call him again on Tuesday after we return from the holiday weekend.  He thanks me for the call and follow up.

## 2019-02-08 NOTE — Telephone Encounter (Signed)
LM on patient's VM (okay per DPR) to call us with an update on how he is doing.

## 2019-02-08 NOTE — Telephone Encounter (Signed)
Pt returned your call Best number [phone]

## 2019-02-08 NOTE — Telephone Encounter (Signed)
Nothing further needed. Continue to monitor.

## 2019-02-09 NOTE — Telephone Encounter (Signed)
Glad to hear he is improving

## 2019-02-12 ENCOUNTER — Telehealth: Payer: Self-pay | Admitting: *Deleted

## 2019-02-12 NOTE — Telephone Encounter (Signed)
Called patient to check on him because he tested positive for covid. Patient stated that he took his last Prednisone Sunday. Patient stated that his temperature has been fine until just a few minutes ago and it is now 99.0. Patient stated that he is still real weak with any activity, has a weird taste in his month/lips and sweating a lot when up moving around. Patient stated that he has not had any SOB or difficulty breathing.  ER precautions given to patient and he verbalized understanding. Patient stated that he appreciated the call and wants the office to continue to check on him. Pharmacy [facility]

## 2019-02-13 NOTE — Telephone Encounter (Signed)
Okay Just contiue with the monitoring

## 2019-02-15 ENCOUNTER — Telehealth: Payer: Self-pay

## 2019-02-15 NOTE — Telephone Encounter (Signed)
I agree with all of that.  Thanks.

## 2019-02-15 NOTE — Telephone Encounter (Signed)
Spoke with patient for an update:  Patient states that he is getting better but still has the following symptoms and wants to know when he can come off of quarantine:  1.  Bad Taste in Mouth and on Lips 2.  Sweats "a lot" but no fever in over 1 week.  He monitors regularly 3.  Bowel movements are not back to normal but NO diarrhea 4.  Weak and very fatigued.  5. Does have some clear PND with clearing throat a lot but no cough or congestion and no breathing difficulty.   No other symptoms to report in the past 3 days or at present.   I informed him that these symptoms sometimes can last for several weeks unfortunately but they are residual, minimal symptoms and do not meet criteria for continued quarantine as he is now 3 weeks out post symptom onset.  However, I do not recommend back to activity full force.  He should increase activity as tolerated with limited outings and respect of masking if needs to go out.  He is to continue rest as needed and can use his Zyrtec (on his med list) 10mg  daily as needed for PND.    Patient verbalizes understanding and did ask me to call him again either Monday or Tuesday just to be sure that he is still improving.  I will follow up with him again next week.  He will continue to monitor his temperature and if he does develop temp or regresses with any of his symptoms, he is to go back under quarantine and call us to let us know.  And, as always, if any severe symptoms, return to the ER.

## 2019-02-17 NOTE — Telephone Encounter (Signed)
Noted Hopefully he is through the worst

## 2019-02-19 ENCOUNTER — Telehealth: Payer: Self-pay

## 2019-02-19 NOTE — Telephone Encounter (Signed)
Spoke with patient, he is doing very well.  Still has some very mild congestion but otherwise asymptomatic and working to get his strength back.   He is off of quarantine and almost back to full activity.  He would like to go ahead and get his flu shot as soon as he is able.   We did discuss the High Dose flu vaccine and he would like to get it if PCP recommends, which he does.   Given patient's recent illness with COVID (he is 4 weeks out and nearly symptoms free), I have scheduled him for the end of next week (9/24/) for his HIGH DOSE flu vaccination.    I will copy Dr. Silvio Pate on this in case he has any concerns with timing and administration.   Patient is doing very well and sounds great on the phone today.  He thanks [place] for the follow through and monitoring during his illness.   Dr. Silvio Pate, let me know if any concerns with him getting the HD flu vaccine in 1.5 weeks, assuming he has no further symptoms or relapse with COVID. Patient is aware to call us if he develops any regression of symptoms at any point but especially before flu vaccine.

## 2019-02-19 NOTE — Telephone Encounter (Signed)
That sounds fine Go ahead with the flu vaccine as planned

## 2019-02-21 NOTE — Telephone Encounter (Signed)
Spoke with patient.  He understands and I will call him in the am to follow up and discuss with Dr. Silvio Pate at that time before we go in to the weekend.

## 2019-02-21 NOTE — Telephone Encounter (Signed)
Patient states that he has now developed a runny, stuffy  nose and has to clear his throat.  When he expectorates to clear his throat he has some greenish sputum with clumps, this started in the last day or 2.  His dry, hacky cough since COVID has continued.  It isn't worse, just lingering.    No chest or head congestion and no SOB.  No other symptoms to report other than low grade temp.   Temp last pm of 99.9 today temp is 99.2 right now   He is wondering with green sputum if he needs an antibiotic?   Please advise.

## 2019-02-21 NOTE — Telephone Encounter (Signed)
Patient called and said Dakota Ellison told him to call back if he had any concerns. Patient is asking for Dakota Ellison to call him back at [phone].

## 2019-02-21 NOTE — Telephone Encounter (Signed)
I would consider that if it continues---don't want to rush to that

## 2019-02-22 MED ORDER — AMOXICILLIN-POT CLAVULANATE 875-125 MG PO TABS: 1.0000 | ORAL_TABLET | Freq: Two times a day (BID) | ORAL | 0 refills | Status: DC

## 2019-02-22 NOTE — Telephone Encounter (Signed)
Spoke to pt

## 2019-02-22 NOTE — Telephone Encounter (Signed)
Please let him know I sent the antibiotic prescription. He should take it twice a day--on a full stomach

## 2019-02-22 NOTE — Telephone Encounter (Signed)
Patient reports ongoing persistent cough and increased green "chunky" sputum when expectorating from throat.  He does feel like congestion has notably increased and is significant in his head/sinus area to the point he is having difficulty breathing through his nose, especially when he lays down.   If calling in an abx please send to [facility].   Thanks.

## 2019-02-28 ENCOUNTER — Ambulatory Visit (INDEPENDENT_AMBULATORY_CARE_PROVIDER_SITE_OTHER): Payer: PPO

## 2019-02-28 DIAGNOSIS — Z23 Encounter for immunization: Secondary | ICD-10-CM | POA: Diagnosis not present

## 2019-03-01 DIAGNOSIS — R269 Unspecified abnormalities of gait and mobility: Secondary | ICD-10-CM | POA: Diagnosis not present

## 2019-03-01 DIAGNOSIS — G4733 Obstructive sleep apnea (adult) (pediatric): Secondary | ICD-10-CM | POA: Diagnosis not present

## 2019-03-06 DIAGNOSIS — M2142 Flat foot [pes planus] (acquired), left foot: Secondary | ICD-10-CM | POA: Diagnosis not present

## 2019-03-06 DIAGNOSIS — M722 Plantar fascial fibromatosis: Secondary | ICD-10-CM | POA: Diagnosis not present

## 2019-03-06 DIAGNOSIS — M2141 Flat foot [pes planus] (acquired), right foot: Secondary | ICD-10-CM | POA: Diagnosis not present

## 2019-03-06 DIAGNOSIS — M216X2 Other acquired deformities of left foot: Secondary | ICD-10-CM | POA: Diagnosis not present

## 2019-03-06 DIAGNOSIS — M79672 Pain in left foot: Secondary | ICD-10-CM | POA: Diagnosis not present

## 2019-03-06 DIAGNOSIS — L603 Nail dystrophy: Secondary | ICD-10-CM | POA: Diagnosis not present

## 2019-03-06 DIAGNOSIS — M19072 Primary osteoarthritis, left ankle and foot: Secondary | ICD-10-CM | POA: Diagnosis not present

## 2019-03-06 DIAGNOSIS — M7732 Calcaneal spur, left foot: Secondary | ICD-10-CM | POA: Diagnosis not present

## 2019-03-07 ENCOUNTER — Other Ambulatory Visit: Payer: Self-pay | Admitting: Internal Medicine

## 2019-03-09 ENCOUNTER — Other Ambulatory Visit: Payer: Self-pay | Admitting: Internal Medicine

## 2019-03-11 ENCOUNTER — Other Ambulatory Visit: Payer: Self-pay

## 2019-03-11 MED ORDER — HYDROCODONE-ACETAMINOPHEN 2.5-325 MG PO TABS: 1.0000 | ORAL_TABLET | Freq: Three times a day (TID) | ORAL | 0 refills | Status: DC | PRN

## 2019-03-11 NOTE — Telephone Encounter (Addendum)
Name of Medication: Hydrocodone 5-325 Name of Pharmacy: [place] or Written Date and Quantity:  12-05-18 #90 Last Office Visit and Type: 12-05-18 Next Office Visit and Type: 03-27-19 Last Controlled Substance Agreement Date: 12-05-18 Last UDS: 12-05-18

## 2019-03-11 NOTE — Telephone Encounter (Signed)
error 

## 2019-03-13 ENCOUNTER — Telehealth: Payer: Self-pay

## 2019-03-13 NOTE — Telephone Encounter (Signed)
Patient called with concerns of lingering bowel issues since been positive for COVID as of 01/27/2019. Patient still has low energy and low strength, also still has a funny taste in his mouth and on his lips. The issue that he is bothering him is the bowel incontinence. Patient's bowels have not been his normal since getting sick with COVID. Bowels are not solid and not runny. The urge comes on suddenly and fast and patient does not always gets to the bathroom on time, has had several incidents of bowel leakeage with this for the past weeks and is becoming a problem. He thought this would subside but it has not. Patient has not tried anything OTC for this. Asking for recommendations. CB: [phone].

## 2019-03-13 NOTE — Telephone Encounter (Signed)
If his appetite is fine and he is not having any pain, okay to try imodium up to once a day to see if that helps until it settles down (as I would expect it eventually will)

## 2019-03-13 NOTE — Addendum Note (Signed)
Addended by: Pilar Grammes on: 03/13/2019 05:07 PM   Modules accepted: Orders

## 2019-03-13 NOTE — Telephone Encounter (Signed)
Left message on VM per DPR. Also, I realized I put in the wrong hydrocodone to be ordered for him. The pharmacy sent a fax. I built the correct one.

## 2019-03-18 MED ORDER — HYDROCODONE-ACETAMINOPHEN 5-325 MG PO TABS: 1.0000 | ORAL_TABLET | Freq: Three times a day (TID) | ORAL | 0 refills | Status: DC | PRN

## 2019-03-18 NOTE — Telephone Encounter (Signed)
Open encounter and sign orders at bottom right of page.

## 2019-03-18 NOTE — Telephone Encounter (Signed)
done

## 2019-03-18 NOTE — Telephone Encounter (Signed)
Patient left a voicemail stating that he has been trying to get a refill on his hydrocodone since the beginning of the month. Patient stated that the pharmacist told him that the wrong script was sent in earlier. Patient is requesting that the correct script be sent in.  See Shannon's note regarding this.

## 2019-03-18 NOTE — Addendum Note (Signed)
Addended by: Jearld Fenton on: 03/18/2019 03:52 PM   Modules accepted: Orders

## 2019-03-18 NOTE — Telephone Encounter (Signed)
Dakota Ellison, can you send this to me as a RX request or has this already been taken care of?

## 2019-03-27 ENCOUNTER — Encounter: Payer: Self-pay | Admitting: Internal Medicine

## 2019-03-27 ENCOUNTER — Ambulatory Visit (INDEPENDENT_AMBULATORY_CARE_PROVIDER_SITE_OTHER): Payer: PPO | Admitting: Internal Medicine

## 2019-03-27 ENCOUNTER — Other Ambulatory Visit: Payer: Self-pay

## 2019-03-27 DIAGNOSIS — Z9989 Dependence on other enabling machines and devices: Secondary | ICD-10-CM

## 2019-03-27 DIAGNOSIS — F1120 Opioid dependence, uncomplicated: Secondary | ICD-10-CM

## 2019-03-27 DIAGNOSIS — F39 Unspecified mood [affective] disorder: Secondary | ICD-10-CM | POA: Diagnosis not present

## 2019-03-27 DIAGNOSIS — I10 Essential (primary) hypertension: Secondary | ICD-10-CM

## 2019-03-27 DIAGNOSIS — Z7189 Other specified counseling: Secondary | ICD-10-CM | POA: Diagnosis not present

## 2019-03-27 DIAGNOSIS — M48061 Spinal stenosis, lumbar region without neurogenic claudication: Secondary | ICD-10-CM

## 2019-03-27 DIAGNOSIS — Z0000 Encounter for general adult medical examination without abnormal findings: Secondary | ICD-10-CM | POA: Diagnosis not present

## 2019-03-27 DIAGNOSIS — G4733 Obstructive sleep apnea (adult) (pediatric): Secondary | ICD-10-CM | POA: Diagnosis not present

## 2019-03-27 NOTE — Assessment & Plan Note (Signed)
I have personally reviewed the Medicare Annual Wellness questionnaire and have noted  1. The patient's medical and social history  2. Their use of alcohol, tobacco or illicit drugs  3. Their current medications and supplements  4. The patient's functional ability including ADL's, fall risks, home safety risks and hearing or visual              impairment.  5. Diet and physical activities  6. Evidence for depression or mood disorders  The patients weight, height, BMI and visual acuity have been recorded in the chart  I have made referrals, counseling and provided education to the patient based review of the above and I have provided the pt with a written personalized care plan for preventive services.   I have provided you with a copy of your personalized plan for preventive services. Please take the time to review along with your updated medication list.  Had flu vaccine May be due for colonoscopy---had colon with polyps in 2014 with Dr Earlean Shawl. Is probably due by next year regardless (unless they were hyperplastic) Did have a shingles vaccine---?zostavax. Consider shingrix at pharmacy Discussed fitness Gets PSA at urologist

## 2019-03-27 NOTE — Assessment & Plan Note (Signed)
Has lost weight over time---discussed ongoing vigilance to continue to bring it down

## 2019-03-27 NOTE — Assessment & Plan Note (Signed)
Dysthymia and occasional anxiety Does well with low dose meds

## 2019-03-27 NOTE — Assessment & Plan Note (Signed)
PDMP shows no concerns

## 2019-03-27 NOTE — Assessment & Plan Note (Signed)
See social history 

## 2019-03-27 NOTE — Progress Notes (Signed)
Subjective:    Patient ID: Dakota Ellison, male    DOB: [birth year], 68 y.o.   MRN: [id number]  HPI Here for Medicare wellness visit and follow up of chronic health conditions Reviewed form and advanced directives Reviewed other doctors No alcohol or tobacco Stays active doing farm work Vision is fine Hearing seems to be good No falls No depression or anhedonia on the medication Independent with instrumental ADLs No memory problems  Feels he is over the [place] has a funny taste/feeling on his tongue Stools finally back to normal now DOE seems to have resolved for the most part  Feels he is recovered from [facility] Has been released by Dr Maureen Ralphs Back to work with transportation for [employer]  No chest pain Breathing is fine No palpitations No dizziness or syncope No sig edema  Chronic pain continues Uses the hydrocodone regularly still Known spinal stenosis and DDD Still has some knee pain--though definitely better since the TKRs  Continues on sertraline On this for a long time--mostly dysthymia Didn't tolerate going off (despite weaning) Does use the xanax rarely  Appetite is okay again since COVID--but overall fills up quicker Did lose weight from years ago--stays active, etc Tries to be careful with eating  Current Outpatient Medications on File Prior to Visit  Medication Sig Dispense Refill  . ALPRAZolam (XANAX) 0.5 MG tablet Take 1 tablet (0.5 mg total) by mouth 2 (two) times daily as needed. (Patient taking differently: Take 0.5 mg by mouth 2 (two) times daily as needed for anxiety. ) 60 tablet 0  . amLODIPine-Valsartan-HCTZ 10-320-25 MG TABS Take 1 tablet by mouth daily. 90 tablet 3  . Apple Cider Vinegar 500 MG TABS Take 500 mg by mouth daily.     . cetirizine (ZYRTEC) 10 MG tablet Take 10 mg by mouth daily.    Marland Kitchen CINNAMON PO Take 200 mg by mouth daily.    Marland Kitchen ezetimibe (ZETIA) 10 MG tablet Take 1 tablet (10 mg total) by mouth daily. (Patient taking  differently: Take 10 mg by mouth every evening. ) 90 tablet 3  . Garlic 6213 MG CAPS Take 2,000 mg by mouth 2 (two) times daily.     . Ginkgo Biloba 120 MG CAPS Take 120 mg by mouth daily.     Marland Kitchen HYDROcodone-acetaminophen (NORCO/VICODIN) 5-325 MG tablet Take 1 tablet by mouth 3 (three) times daily as needed for moderate pain. 90 tablet 0  . loperamide (IMODIUM A-D) 2 MG tablet Take 2 tablets (4 mg total) by mouth 4 (four) times daily as needed for diarrhea or loose stools. 30 tablet 0  . Multiple Vitamin (MULTIVITAMIN WITH MINERALS) TABS tablet Take 1 tablet by mouth daily.    . niacin 500 MG CR capsule Take 500 mg by mouth 2 (two) times daily.     Marland Kitchen omeprazole (PRILOSEC) 20 MG capsule Take 1 capsule (20 mg total) by mouth daily. (Patient taking differently: Take 20 mg by mouth every evening. ) 90 capsule 3  . sertraline (ZOLOFT) 25 MG tablet TAKE 1 TABLET (25 MG TOTAL) BY MOUTH DAILY. (Patient taking differently: Take 25 mg by mouth every evening. TAKE 1 TABLET (25 MG TOTAL) BY MOUTH DAILY.) 90 tablet 3  . sildenafil (VIAGRA) 100 MG tablet Take as directed (Patient taking differently: Take 100 mg by mouth daily as needed for erectile dysfunction. Take as directed) 10 tablet 5  . SYSTANE ULTRA 0.4-0.3 % SOLN Place 1 drop into both eyes 4 (four) times daily as  needed (dry/irritated eyes.).    Marland Kitchen testosterone cypionate (DEPOTESTOSTERONE CYPIONATE) 200 MG/ML injection Inject 200 mg into the muscle every 14 (fourteen) days. Saturdays    . Turmeric (QC TUMERIC COMPLEX PO) Take 1 tablet by mouth daily.     No current facility-administered medications on file prior to visit.     Allergies  Allergen Reactions  . Atorvastatin Other (See Comments)    Muscle weakness in his legs  . Crestor [Rosuvastatin] Other (See Comments)    Severe muscle cramping  . Zocor [Simvastatin] Other (See Comments)    Muscle weakness in his legs    Past Medical History:  Diagnosis Date  . Anxiety state 04/08/2014   denies   . Complication of anesthesia    and trouble waking up  . Degenerative disc disease   . Dysthymia    denies  . ED (erectile dysfunction)   . GERD (gastroesophageal reflux disease)   . History of MRSA infection    on lip from cut  . Hyperlipemia   . Hypertension    take BP med for small leak in heart to keep it small per pt.  . Mild asthma    as a teen  . Morbid obesity (HCC)   . OSA (obstructive sleep apnea)   . Osteoarthritis   . PONV (postoperative nausea and vomiting)   . Rectal fissure   . Testosterone deficiency     Past Surgical History:  Procedure Laterality Date  . HERNIA REPAIR     1998  . KNEE SURGERY  09/2010   left knee and right knee arthroscopy  . OTHER SURGICAL HISTORY     arthroscopic subacromial decompression  . OTHER SURGICAL HISTORY     open resection, distal right clavicle  . SHOULDER ARTHROSCOPY     right  . SPHINCTEROTOMY    . TOTAL KNEE ARTHROPLASTY Left 06/11/2018   Procedure: LEFT TOTAL KNEE ARTHROPLASTY;  Surgeon: [name], MD;  Location: [facility];  Service: Orthopedics;  Laterality: Left;   . TOTAL KNEE ARTHROPLASTY Right 12/17/2018   Procedure: TOTAL KNEE ARTHROPLASTY;  Surgeon: [name], MD;  Location: [facility];  Service: Orthopedics;  Laterality: Right;     Family History  Problem Relation Age of Onset  . Pneumonia Brother     Social History   Socioeconomic History  . Marital status: Single    Spouse name: Not on file  . Number of children: 1  . Years of education: Not on file  . Highest education level: Not on file  Occupational History  . Occupation: long Estate agent    Comment: Retired  . Occupation: Transports prisoners part time    Comment: Engineer, materials  Social Needs  . Financial resource strain: Not on file  . Food insecurity    Worry: Not on file    Inability: Not on file  . Transportation needs    Medical: Not on file    Non-medical: Not on file  Tobacco Use  . Smoking status: Former  Smoker    Types: Cigars    Quit date: 06/07/2011    Years since quitting: 7.8  . Smokeless tobacco: Never Used  . Tobacco comment: CIGARS ONLY--QUIT 2013  Substance and Sexual Activity  . Alcohol use: Yes    Alcohol/week: 0.0 standard drinks    Comment: rare//occassional  . Drug use: No  . Sexual activity: Yes  Lifestyle  . Physical activity    Days per week: Not on file  Minutes per session: Not on file  . Stress: Not on file  Relationships  . Social Musicianconnections    Talks on phone: Not on file    Gets together: Not on file    Attends religious service: Not on file    Active member of club or organization: Not on file    Attends meetings of clubs or organizations: Not on file    Relationship status: Not on file  . Intimate partner violence    Fear of current or ex partner: Not on file    Emotionally abused: Not on file    Physically abused: Not on file    Forced sexual activity: Not on file  Other Topics Concern  . Not on file  Social History Narrative  . Not on file   Review of Systems Teeth okay--sees dentist tomorrow Keeps up with urologist---still on the testosterone injections No suspicious skin lesions now Sleeps is variable---uses the CPAP every night. Nocturia x 2 usually. Flow is okay Wears seat belt Occasional acid reflux--rare on the PPI. No dysphagia Bowels are fine--no blood    Objective:   Physical Exam  Constitutional: He is oriented to person, place, and time. He appears well-developed. No distress.  HENT:  Mouth/Throat: Oropharynx is clear and moist. No oropharyngeal exudate.  No oral lesions  Neck: No thyromegaly present.  Cardiovascular: Normal rate, regular rhythm, normal heart sounds and intact distal pulses. Exam reveals no gallop.  No murmur heard. Respiratory: Effort normal and breath sounds normal. No respiratory distress. He has no wheezes. He has no rales.  GI: Soft. There is no abdominal tenderness.  Musculoskeletal:        General:  No tenderness or edema.  Lymphadenopathy:    He has no cervical adenopathy.  Neurological: He is alert and oriented to person, place, and time.  President--- "Benson [place]rump, Obama, Bush" [phone]-l-[id number] Recall 3/3  Skin: No rash noted. No erythema.  Psychiatric: He has a normal mood and affect. His behavior is normal.           Assessment & Plan:

## 2019-03-27 NOTE — Assessment & Plan Note (Signed)
Uses CPAP every night 

## 2019-03-27 NOTE — Assessment & Plan Note (Signed)
Chronic pain Remains functional with tid hydrocodone

## 2019-03-27 NOTE — Assessment & Plan Note (Signed)
BP Readings from Last 3 Encounters:  03/27/19 104/60  02/04/19 131/83  01/27/19 125/69   Good control

## 2019-03-31 DIAGNOSIS — R269 Unspecified abnormalities of gait and mobility: Secondary | ICD-10-CM | POA: Diagnosis not present

## 2019-03-31 DIAGNOSIS — G4733 Obstructive sleep apnea (adult) (pediatric): Secondary | ICD-10-CM | POA: Diagnosis not present

## 2019-03-31 NOTE — Progress Notes (Signed)
Cardiology Office Note  Date:  04/02/2019   ID:  Dakota Ellison, DOB 01-04-1951, MRN [id number]  PCP:  Karie SchwalbeLetvak, Richard I, MD   Chief Complaint  Patient presents with  . Other    12 month follow up. patient denies chest pain and SOB at this time. Meds reviewed verbally with patient.     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 No regular exercise program.  He is able to tolerate his CPAP.  Rare orthostasis  Labs: LDL 88 one year ago  Plays in [employer]  EKG personally reviewed by myself on todays visit Shows normal sinus rhythm rate 74 bpm no significant ST or T wave changes  Problems tolerating statins including Crestor, simvastatin, Lipitor Able to tolerate Zetia Previously with myalgias on the others  Chronic orthopedic issues, back and knee bilaterally B/l knee replacement 2020  Other past medical history previous lower extremity ultrasound. By his report, this was normal with no evidence of PAD.  Last echocardiogram in March 2008 showing normal systolic function, right ventricle mildly dilated, mild mitral regurg, mild tricuspid regurg. Last stress test in March 2008 where he achieved 10 mets, exercised for 9 minutes, no ischemia noted. Overall a normal,  low-risk scan.    PMH:   has a past medical history of Anxiety state (04/08/2014), Complication of anesthesia, Degenerative disc disease, Dysthymia, ED (erectile dysfunction), GERD (gastroesophageal reflux  disease), History of MRSA infection, Hyperlipemia, Hypertension, Mild asthma, Morbid obesity (HCC), OSA (obstructive sleep apnea), Osteoarthritis, PONV (postoperative nausea and vomiting), Rectal fissure, and Testosterone deficiency.  PSH:    Past Surgical History:  Procedure Laterality Date  . HERNIA REPAIR     1998  . KNEE SURGERY  09/2010   left knee and right knee arthroscopy  . OTHER SURGICAL HISTORY     arthroscopic subacromial decompression  . OTHER SURGICAL HISTORY     open resection, distal right clavicle  . SHOULDER ARTHROSCOPY     right  . SPHINCTEROTOMY    . TOTAL KNEE ARTHROPLASTY Left 06/11/2018   Procedure: LEFT TOTAL KNEE ARTHROPLASTY;  Surgeon: Ollen GrossAluisio, Frank, MD;  Location: [facility];  Service: Orthopedics;  Laterality: Left;  50min  . TOTAL KNEE ARTHROPLASTY Right 12/17/2018   Procedure: TOTAL KNEE ARTHROPLASTY;  Surgeon: Ollen GrossAluisio, Frank, MD;  Location: [facility];  Service: Orthopedics;  Laterality: Right;  50min    Current Outpatient Medications  Medication Sig Dispense Refill  . ALPRAZolam (XANAX) 0.5 MG tablet Take 1 tablet (0.5 mg total) by mouth 2 (two) times daily as needed. (Patient taking differently: Take 0.5 mg by mouth 2 (two) times daily as needed for anxiety. ) 60 tablet 0  . amLODIPine-Valsartan-HCTZ 10-320-25 MG TABS Take 1 tablet by mouth daily. 90 tablet 3  . Apple Cider Vinegar 500 MG TABS Take 500 mg by mouth daily.     . cetirizine (ZYRTEC) 10 MG tablet Take 10 mg by mouth daily.    Marland Kitchen. CINNAMON PO Take 200 mg by mouth daily.    .Marland Kitchen  ezetimibe (ZETIA) 10 MG tablet Take 1 tablet (10 mg total) by mouth daily. (Patient taking differently: Take 10 mg by mouth every evening. ) 90 tablet 3  . Garlic 2000 MG CAPS Take 2,000 mg by mouth 2 (two) times daily.     . Ginkgo Biloba 120 MG CAPS Take 120 mg by mouth daily.     Marland Kitchen HYDROcodone-acetaminophen (NORCO/VICODIN) 5-325 MG tablet Take 1 tablet by mouth 3 (three) times daily as needed for moderate pain. 90 tablet 0  .  loperamide (IMODIUM A-D) 2 MG tablet Take 2 tablets (4 mg total) by mouth 4 (four) times daily as needed for diarrhea or loose stools. 30 tablet 0  . Multiple Vitamin (MULTIVITAMIN WITH MINERALS) TABS tablet Take 1 tablet by mouth daily.    . niacin 500 MG CR capsule Take 500 mg by mouth 2 (two) times daily.     Marland Kitchen omeprazole (PRILOSEC) 20 MG capsule Take 1 capsule (20 mg total) by mouth daily. (Patient taking differently: Take 20 mg by mouth every evening. ) 90 capsule 3  . sertraline (ZOLOFT) 25 MG tablet TAKE 1 TABLET (25 MG TOTAL) BY MOUTH DAILY. (Patient taking differently: Take 25 mg by mouth every evening. TAKE 1 TABLET (25 MG TOTAL) BY MOUTH DAILY.) 90 tablet 3  . sildenafil (VIAGRA) 100 MG tablet Take as directed (Patient taking differently: Take 100 mg by mouth daily as needed for erectile dysfunction. Take as directed) 10 tablet 5  . SYSTANE ULTRA 0.4-0.3 % SOLN Place 1 drop into both eyes 4 (four) times daily as needed (dry/irritated eyes.).    Marland Kitchen testosterone cypionate (DEPOTESTOSTERONE CYPIONATE) 200 MG/ML injection Inject 200 mg into the muscle every 14 (fourteen) days. Saturdays    . Turmeric (QC TUMERIC COMPLEX PO) Take 1 tablet by mouth daily.     No current facility-administered medications for this visit.      Allergies:   Atorvastatin, Crestor [rosuvastatin], and Zocor [simvastatin]   Social History:  The patient  reports that he quit smoking about 7 years ago. His smoking use included cigars. He has never used smokeless tobacco. He reports current alcohol use. He reports that he does not use drugs.   Family History:   family history includes Pneumonia in his brother.    Review of Systems: Review of Systems  Constitutional: Negative.   Respiratory: Negative.   Cardiovascular: Negative.   Gastrointestinal: Negative.   Musculoskeletal: Positive for back pain and joint pain.  Neurological: Negative.   Psychiatric/Behavioral: Negative.   All other systems reviewed and are  negative.   PHYSICAL EXAM: VS:  BP 110/64 (BP Location: Left Arm, Patient Position: Sitting, Cuff Size: Normal)   Pulse 74   Ht 6' (1.829 m)   Wt 287 lb (130.2 kg)   BMI 38.92 kg/m  , BMI Body mass index is 38.92 kg/m. Constitutional:  oriented to person, place, and time. No distress.  HENT:  Head: Grossly normal Eyes:  no discharge. No scleral icterus.  Neck: No JVD, no carotid bruits  Cardiovascular: Regular rate and rhythm, no murmurs appreciated Pulmonary/Chest: Clear to auscultation bilaterally, no wheezes or rails Abdominal: Soft.  no distension.  no tenderness.  Musculoskeletal: Normal range of motion Neurological:  normal muscle tone. Coordination normal. No atrophy Skin: Skin warm and dry Psychiatric: normal affect, pleasant  Recent Labs: 02/04/2019: ALT 29; BUN 16; Creatinine, Ser 1.04; Hemoglobin 15.6; Platelets 201; Potassium 3.7; Sodium 135    Lipid Panel Lab Results  Component Value Date  CHOL 158 10/09/2017   HDL 42.20 10/09/2017   LDLCALC 61 10/08/2015   TRIG 240.0 (H) 10/09/2017      Wt Readings from Last 3 Encounters:  04/02/19 287 lb (130.2 kg)  03/27/19 282 lb (127.9 kg)  02/04/19 273 lb 5.9 oz (124 kg)      ASSESSMENT AND PLAN:   Essential hypertension - Plan: EKG 12-Lead Blood pressure is well controlled on today's visit. No changes made to the medications. Morbid obesity (HCC) Recommended exercise, watch the diet Walking program  Pure hypercholesterolemia Myalgias on Crestor 10, does not want to retry different statin He is tolerating Zetia 10 mg daily Calcium score of 0 No aortic atherosclerosis  Obstructive sleep apnea on CPAP Tolerating his CPAP  Recent Covid infection Discussed recovery Still with problems involving his mouth, taste Feels tired     Total encounter time more than 25 minutes  Greater than 50% was spent in counseling and coordination of care with the patient    Disposition:   F/U  12 months as  needed   Orders Placed This Encounter  Procedures  . EKG 12-Lead     Signed, Esmond Plants, M.D., Ph.D. 04/02/2019  [place], [place]

## 2019-04-02 ENCOUNTER — Encounter: Payer: Self-pay | Admitting: Cardiovascular Disease

## 2019-04-02 ENCOUNTER — Other Ambulatory Visit: Payer: Self-pay

## 2019-04-02 ENCOUNTER — Ambulatory Visit (INDEPENDENT_AMBULATORY_CARE_PROVIDER_SITE_OTHER): Payer: PPO | Admitting: Cardiovascular Disease

## 2019-04-02 DIAGNOSIS — Z8619 Personal history of other infectious and parasitic diseases: Secondary | ICD-10-CM

## 2019-04-02 DIAGNOSIS — Z7189 Other specified counseling: Secondary | ICD-10-CM | POA: Diagnosis not present

## 2019-04-02 DIAGNOSIS — E782 Mixed hyperlipidemia: Secondary | ICD-10-CM | POA: Diagnosis not present

## 2019-04-02 DIAGNOSIS — I10 Essential (primary) hypertension: Secondary | ICD-10-CM

## 2019-04-02 NOTE — Patient Instructions (Signed)

## 2019-04-03 DIAGNOSIS — R351 Nocturia: Secondary | ICD-10-CM | POA: Diagnosis not present

## 2019-04-03 DIAGNOSIS — R972 Elevated prostate specific antigen [PSA]: Secondary | ICD-10-CM | POA: Diagnosis not present

## 2019-04-03 DIAGNOSIS — E291 Testicular hypofunction: Secondary | ICD-10-CM | POA: Diagnosis not present

## 2019-04-18 ENCOUNTER — Other Ambulatory Visit: Payer: Self-pay | Admitting: Internal Medicine

## 2019-04-22 NOTE — Telephone Encounter (Signed)
Name of Medication: Hydrocodone 5-325 Name of Pharmacy: [place] or Written Date and Quantity:  03-18-2019 #90 Last Office Visit and Type: 03-27-19 Next Office Visit and Type: 06-24-2019 Last Controlled Substance Agreement Date: 12-05-18 Last UDS: 12-05-18

## 2019-04-26 DIAGNOSIS — G4733 Obstructive sleep apnea (adult) (pediatric): Secondary | ICD-10-CM | POA: Diagnosis not present

## 2019-05-01 DIAGNOSIS — R269 Unspecified abnormalities of gait and mobility: Secondary | ICD-10-CM | POA: Diagnosis not present

## 2019-05-01 DIAGNOSIS — G4733 Obstructive sleep apnea (adult) (pediatric): Secondary | ICD-10-CM | POA: Diagnosis not present

## 2019-05-12 DIAGNOSIS — Z20828 Contact with and (suspected) exposure to other viral communicable diseases: Secondary | ICD-10-CM | POA: Diagnosis not present

## 2019-05-23 ENCOUNTER — Other Ambulatory Visit: Payer: Self-pay | Admitting: Internal Medicine

## 2019-05-24 NOTE — Telephone Encounter (Signed)
Name of Medication: Hydrocodone Name of Pharmacy: [place] or Written Date and Quantity: 04-22-19 #90 Last Office Visit and Type: 03-27-19 Next Office Visit and Type: 06-24-19 Last Controlled Substance Agreement Date: 12-05-18 Last UDS: 12-05-18

## 2019-06-17 DIAGNOSIS — K5730 Diverticulosis of large intestine without perforation or abscess without bleeding: Secondary | ICD-10-CM | POA: Diagnosis not present

## 2019-06-17 DIAGNOSIS — K648 Other hemorrhoids: Secondary | ICD-10-CM | POA: Diagnosis not present

## 2019-06-17 DIAGNOSIS — Z1211 Encounter for screening for malignant neoplasm of colon: Secondary | ICD-10-CM | POA: Diagnosis not present

## 2019-06-24 ENCOUNTER — Other Ambulatory Visit: Payer: Self-pay

## 2019-06-24 ENCOUNTER — Encounter: Payer: Self-pay | Admitting: Internal Medicine

## 2019-06-24 ENCOUNTER — Ambulatory Visit (INDEPENDENT_AMBULATORY_CARE_PROVIDER_SITE_OTHER): Payer: PPO | Admitting: Internal Medicine

## 2019-06-24 DIAGNOSIS — M48061 Spinal stenosis, lumbar region without neurogenic claudication: Secondary | ICD-10-CM

## 2019-06-24 DIAGNOSIS — F1120 Opioid dependence, uncomplicated: Secondary | ICD-10-CM

## 2019-06-24 DIAGNOSIS — F39 Unspecified mood [affective] disorder: Secondary | ICD-10-CM

## 2019-06-24 MED ORDER — ALPRAZOLAM 0.5 MG PO TABS: 0.5000 mg | ORAL_TABLET | Freq: Two times a day (BID) | ORAL | 0 refills | Status: DC | PRN

## 2019-06-24 MED ORDER — HYDROCODONE-ACETAMINOPHEN 5-325 MG PO TABS: ORAL_TABLET | ORAL | 0 refills | Status: DC

## 2019-06-24 NOTE — Progress Notes (Signed)
Subjective:    Patient ID: Dakota Ellison, male    DOB: 02-06-1951, 69 y.o.   MRN: [id number]  HPI Here for follow up of chronic pain --and other medical conditions  This visit occurred during the SARS-CoV-2 public health emergency.  Safety protocols were in place, including screening questions prior to the visit, additional usage of staff PPE, and extensive cleaning of exam room while observing appropriate contact time as indicated for disinfecting solutions.   Left TKR seems good but the right one is still limited ROM not full yet (flexion) Still has pain in knees--but different than before TKRs Back continues to be bad--tries to maintain his activity level Still needs the hydrocodone tid   Still gets his nerves acting up at times On the sertraline but needs the xanax once in a while  Current Outpatient Medications on File Prior to Visit  Medication Sig Dispense Refill  . ALPRAZolam (XANAX) 0.5 MG tablet Take 1 tablet (0.5 mg total) by mouth 2 (two) times daily as needed. (Patient taking differently: Take 0.5 mg by mouth 2 (two) times daily as needed for anxiety. ) 60 tablet 0  . amLODIPine-Valsartan-HCTZ 10-320-25 MG TABS Take 1 tablet by mouth daily. 90 tablet 3  . Apple Cider Vinegar 500 MG TABS Take 500 mg by mouth daily.     . cetirizine (ZYRTEC) 10 MG tablet Take 10 mg by mouth daily.    Marland Kitchen CINNAMON PO Take 200 mg by mouth daily.    Marland Kitchen ezetimibe (ZETIA) 10 MG tablet Take 1 tablet (10 mg total) by mouth daily. (Patient taking differently: Take 10 mg by mouth every evening. ) 90 tablet 3  . Garlic 2000 MG CAPS Take 2,000 mg by mouth 2 (two) times daily.     . Ginkgo Biloba 120 MG CAPS Take 120 mg by mouth daily.     Marland Kitchen HYDROcodone-acetaminophen (NORCO/VICODIN) 5-325 MG tablet TAKE 1 TABLET BY MOUTH 3 TIMES DAILY AS NEEDED FOR MODERATE PAIN 90 tablet 0  . Multiple Vitamin (MULTIVITAMIN WITH MINERALS) TABS tablet Take 1 tablet by mouth daily.    . niacin 500 MG CR capsule Take 500 mg  by mouth 2 (two) times daily.     Marland Kitchen omeprazole (PRILOSEC) 20 MG capsule Take 1 capsule (20 mg total) by mouth daily. (Patient taking differently: Take 20 mg by mouth every evening. ) 90 capsule 3  . sertraline (ZOLOFT) 25 MG tablet TAKE 1 TABLET (25 MG TOTAL) BY MOUTH DAILY. (Patient taking differently: Take 25 mg by mouth every evening. TAKE 1 TABLET (25 MG TOTAL) BY MOUTH DAILY.) 90 tablet 3  . sildenafil (VIAGRA) 100 MG tablet Take as directed (Patient taking differently: Take 100 mg by mouth daily as needed for erectile dysfunction. Take as directed) 10 tablet 5  . SYSTANE ULTRA 0.4-0.3 % SOLN Place 1 drop into both eyes 4 (four) times daily as needed (dry/irritated eyes.).    Marland Kitchen testosterone cypionate (DEPOTESTOSTERONE CYPIONATE) 200 MG/ML injection Inject 200 mg into the muscle every 14 (fourteen) days. Saturdays    . Turmeric (QC TUMERIC COMPLEX PO) Take 1 tablet by mouth daily.    Marland Kitchen loperamide (IMODIUM A-D) 2 MG tablet Take 2 tablets (4 mg total) by mouth 4 (four) times daily as needed for diarrhea or loose stools. (Patient not taking: Reported on 06/24/2019) 30 tablet 0   No current facility-administered medications on file prior to visit.    Allergies  Allergen Reactions  . Atorvastatin Other (See Comments)  Muscle weakness in his legs  . Crestor [Rosuvastatin] Other (See Comments)    Severe muscle cramping  . Zocor [Simvastatin] Other (See Comments)    Muscle weakness in his legs    Past Medical History:  Diagnosis Date  . Anxiety state 04/08/2014   denies  . Complication of anesthesia    and trouble waking up  . Degenerative disc disease   . Dysthymia    denies  . ED (erectile dysfunction)   . GERD (gastroesophageal reflux disease)   . History of MRSA infection    on lip from cut  . Hyperlipemia   . Hypertension    take BP med for small leak in heart to keep it small per pt.  . Mild asthma    as a teen  . Morbid obesity (HCC)   . OSA (obstructive sleep apnea)   .  Osteoarthritis   . PONV (postoperative nausea and vomiting)   . Rectal fissure   . Testosterone deficiency     Past Surgical History:  Procedure Laterality Date  . HERNIA REPAIR     1998  . KNEE SURGERY  09/2010   left knee and right knee arthroscopy  . OTHER SURGICAL HISTORY     arthroscopic subacromial decompression  . OTHER SURGICAL HISTORY     open resection, distal right clavicle  . SHOULDER ARTHROSCOPY     right  . SPHINCTEROTOMY    . TOTAL KNEE ARTHROPLASTY Left 06/11/2018   Procedure: LEFT TOTAL KNEE ARTHROPLASTY;  Surgeon: [name], MD;  Location: [facility];  Service: Orthopedics;  Laterality: Left;   . TOTAL KNEE ARTHROPLASTY Right 12/17/2018   Procedure: TOTAL KNEE ARTHROPLASTY;  Surgeon: [name], MD;  Location: [facility];  Service: Orthopedics;  Laterality: Right;     Family History  Problem Relation Age of Onset  . Pneumonia Brother     Social History   Socioeconomic History  . Marital status: Single    Spouse name: Not on file  . Number of children: 1  . Years of education: Not on file  . Highest education level: Not on file  Occupational History  . Occupation: long Estate agent    Comment: Retired  . 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 Smoking status: Former Smoker    Types: Cigars    Quit date: 06/07/2011    Years since quitting: 8.0  . Smokeless tobacco: Never Used  . Tobacco comment: CIGARS ONLY--QUIT 2013  Substance and Sexual Activity  . Alcohol use: Yes    Alcohol/week: 0.0 standard drinks    Comment: rare//occassional  . Drug use: No  . Sexual activity: Yes  Other Topics Concern  . Not on file  Social History Narrative   Has living will   Son is health care POA   Would accept resuscitation attempts   Likely wouldn't want extended tube feeds if cognitively unaware   Social Determinants of Health   Financial Resource Strain:   . Difficulty of Paying Living  Expenses: Not on file  Food Insecurity:   . Worried About Programme researcher, broadcasting/film/video in the Last Year: Not on file  . Ran Out of Food in the Last Year: Not on file  Transportation Needs:   . Lack of Transportation (Medical): Not on file  . Lack of Transportation (Non-Medical): Not on file  Physical Activity:   . Days of Exercise per Week: Not on file  .  Minutes of Exercise per Session: Not on file  Stress:   . Feeling of Stress : Not on file  Social Connections:   . Frequency of Communication with Friends and Family: Not on file  . Frequency of Social Gatherings with Friends and Family: Not on file  . Attends Religious Services: Not on file  . Active Member of Clubs or Organizations: Not on file  . Attends Archivist Meetings: Not on file  . Marital Status: Not on file  Intimate Partner Violence:   . Fear of Current or Ex-Partner: Not on file  . Emotionally Abused: Not on file  . Physically Abused: Not on file  . Sexually Abused: Not on file   Review of Systems Still has a funny taste on his lips--since COVID in August Seems more cold intolerant since COVID also Did have colonoscopy last week---hemorrhoids but no polyps this time    Objective:   Physical Exam  Constitutional: He appears well-developed. No distress.  Psychiatric: He has a normal mood and affect. His behavior is normal.           Assessment & Plan:

## 2019-06-24 NOTE — Assessment & Plan Note (Signed)
Chronic back pain but he tries to stay active Ongoing need for the hydrocodone

## 2019-06-24 NOTE — Assessment & Plan Note (Addendum)
He is trying to be attentive to his eating BMI ~37 with CAD, back problems, HTN, etc Spoke to him about getting the COVID vaccine due to his increased risk (even though he had it already)

## 2019-06-24 NOTE — Assessment & Plan Note (Signed)
Chronic mild dysthymia and anxiety On the sertraline and didn't tolerate wean

## 2019-06-24 NOTE — Assessment & Plan Note (Signed)
PDMP reviewed No concerns 

## 2019-07-24 ENCOUNTER — Other Ambulatory Visit: Payer: Self-pay | Admitting: Internal Medicine

## 2019-07-24 NOTE — Telephone Encounter (Signed)
Name of Medication: Hydrocodone Name of Pharmacy: [facility] Last Fill or Written Date and Quantity: 06-24-19 #90 Last Office Visit and Type: 06-24-19 Next Office Visit and Type: 09-23-19 Last Controlled Substance Agreement Date: 12-05-18 Last UDS: 12-05-18

## 2019-07-27 ENCOUNTER — Encounter: Payer: Self-pay | Admitting: Internal Medicine

## 2019-07-29 ENCOUNTER — Ambulatory Visit: Payer: PPO | Admitting: Internal Medicine

## 2019-07-29 ENCOUNTER — Telehealth: Payer: Self-pay

## 2019-07-29 ENCOUNTER — Encounter: Payer: Self-pay | Admitting: Internal Medicine

## 2019-07-29 ENCOUNTER — Other Ambulatory Visit: Payer: Self-pay

## 2019-07-29 VITALS — BP 118/64 | HR 83 | Temp 97.1°F | Ht 72.0 in | Wt 281.4 lb

## 2019-07-29 DIAGNOSIS — Z9989 Dependence on other enabling machines and devices: Secondary | ICD-10-CM | POA: Diagnosis not present

## 2019-07-29 DIAGNOSIS — J209 Acute bronchitis, unspecified: Secondary | ICD-10-CM | POA: Diagnosis not present

## 2019-07-29 DIAGNOSIS — G4733 Obstructive sleep apnea (adult) (pediatric): Secondary | ICD-10-CM | POA: Diagnosis not present

## 2019-07-29 MED ORDER — AZITHROMYCIN 250 MG PO TABS: ORAL_TABLET | ORAL | 0 refills | Status: DC

## 2019-07-29 NOTE — Telephone Encounter (Signed)
[facility] Night - Client TELEPHONE ADVICE RECORD [facility] Patient Name: Dakota Ellison Gender: Male DOB: [birth year] Age: 69 Y 10 M 19 D Return Phone Number: [phone] (Primary) Address: City/State/Zip: [place] [address] Client [facility] Night - Client Client Site [place] Physician Viviana Simpler - MD Contact Type Call Who Is Calling Patient / Member / Family / Caregiver Call Type Triage / Clinical Relationship To Patient Self Return Phone Number [phone] (Primary) Chief Complaint Sore Throat Reason for Call Symptomatic / Request for [place] states he has a really sore throat and has been coughing up mucus. He has been sneezing and coughing and would like to know if he can get some medication sent to the pharmacy for him. Translation No Nurse Assessment Nurse: Clovis Riley, RN, Georgina Peer Date/Time Eilene Ghazi Time): 07/27/2019 10:06:22 AM Confirm and document reason for call. If symptomatic, describe symptoms. ---Caller states he has a sore throat that hurts really bad with swallowing. He has been coughing up mucus that green and thick. He has been sneezing. No fever. Symptoms started on Thursday night. Has the patient had close contact with a person known or suspected to have the novel coronavirus illness OR traveled / lives in area with major community spread (including international travel) in the last 14 days from the onset of symptoms? * If Asymptomatic, screen for exposure and travel within the last 14 days. ---No Does the patient have any new or worsening symptoms? ---Yes Will a triage be completed? ---Yes Related visit to physician within the last 2 weeks? ---No Does the PT have any chronic conditions? (i.e. diabetes, asthma, this includes High risk factors for pregnancy, etc.) ---Yes List chronic conditions. ---Leaky heart valve, GERD, depression,  cholesterol Is this a behavioral health or substance abuse call? ---No Guidelines Guideline Title Affirmed Question Affirmed Notes Nurse Date/Time Eilene Ghazi Time) Sore Throat SEVERE (e.g., excruciating) throat pain [place], RN, Georgina Peer 07/27/2019 10:09:37 AM Disp. Time Eilene Ghazi Time) Disposition Final User PLEASE NOTE: All timestamps contained within this report are represented as [place] Standard Time. CONFIDENTIALTY NOTICE: This fax transmission is intended only for the addressee. It contains information that is legally privileged, confidential or otherwise protected from use or disclosure. If you are not the intended recipient, you are strictly prohibited from reviewing, disclosing, copying using or disseminating any of this information or taking any action in reliance on or regarding this information. If you have received this fax in error, please notify us immediately by telephone so that we can arrange for its return to [place]. Phone: [phone], Toll-Free: [phone], Fax: [phone] Page: 2 of 2 Call Id: [id number] 07/27/2019 10:14:59 AM See PCP within 24 Hours Yes Clovis Riley, RN, Leilani Merl Disagree/Comply Comply Caller Understands Yes PreDisposition [place] Advice Given Per Guideline SEE PCP WITHIN 24 HOURS: DRINK PLENTY LIQUIDS: * Drink plenty of liquids. This is important to prevent dehydration. * A healthy adult should drink 8 cups (240 ml) or more of liquid each day. * How can you tell if you are drinking enough liquids? The goal is to keep the urine clear or light-yellow in color. If your urine is bright yellow or dark yellow, you are probably not drinking enough liquids. * Caution: Some medical problems require fluid restriction. CALL BACK IF: * You become worse. CARE ADVICE given per Sore Throat (Adult) guideline. * IF OFFICE WILL BE OPEN: You need to be seen within the next 24 hours. Call your  doctor (or NP/PA) when the office opens and make an appointment. SORE THROAT -  For relief of sore throat: * Sip warm chicken broth or apple juice. * Suck on hard candy or a throat lozenge (OTC). * Gargle with warm salt water four times a day. To make salt water, put 1/2 teaspoon of salt in 8 oz (240 ml) of warm water. * Avoid cigarette smoke. * ACETAMINOPHEN REGULAR STRENGTH TYLENOL: Take 650 mg (two 325 mg pills) by mouth every 4-6 hours as needed. Each Regular Strength Tylenol pill has 325 mg of acetaminophen. The most you should take each day is 3,250 mg (10 pills a day). * IBUPROFEN (E.G., MOTRIN, ADVIL): Take 400 mg (two 200 mg pills) by mouth every 6 hours. The most you should take each day is 1,200 mg (six 200 mg pills), unless your doctor has told you to take more. * Eat a soft diet. * Cold drinks, popsicles, and milk shakes are especially good. Avoid citrus fruits. SOFT DIET: Referrals [facility]

## 2019-07-29 NOTE — Patient Instructions (Signed)
Script sent for Zpak antibiotic   Order- DME [facility]- please replace Headgear, continue auto 10-20, mask of choice, humidifier, supplies, AirView/ card  Please call if we can help

## 2019-07-29 NOTE — Telephone Encounter (Signed)
Tried to contact pt but no answer and no VM.  

## 2019-07-29 NOTE — Telephone Encounter (Signed)
Tried to contact pt again but no answer and VM is full. Sent mychart msg.

## 2019-07-29 NOTE — Progress Notes (Signed)
Subjective:    Patient ID: Dakota Ellison, male    DOB: 05/10/1951, 69 y.o.   MRN: [id number]  HPI  male Never smoker followed for OSA complicated by morbid obesity, HBP, asthma, GERD NPSG 2010, AHI 82/ hr CPAP to 20  ------------------------------------------------------------------------------------  07/30/2018- 69 year old male never smoker followed for OSA, complicated by morbid obesity, HBP, asthma, GERD, ( works transporting inmates) CPAP 16/[facility]>> replacing old machine, change to auto 10-20 Download 96% compliance, AHI 3.2/ hr Had left TKR 06/12/2018 -----Doing well at this time. He would like a new cpap machine it has been since 2014.Dme [facility] Body weight today 282 pounds  07/29/19- 69 year old male never smoker followed for OSA, complicated by morbid obesity, HBP, asthma, GERD, ( works transporting inmates) CPAP auto 10-20/ [facility] Download compliance 100%, AHI 8/ hr ------f/u OSA . Breathing is at his baseline.  Had Covid infection in August. Body weight today 281 lbs ------f/u OSA . Breathing is at his baseline.  CPAP doing well. Now several days of sore throat, green phlegm, no fever. CXR 1V 01/27/2019-  No evidence of acute cardiopulmonary disease.    ROS-see HPI   + = positive Constitutional:    weight loss, night sweats, fevers, chills, fatigue, lassitude. HEENT:    headaches, difficulty swallowing, tooth/dental problems, sore throat,       sneezing, itching, ear ache, nasal congestion, post nasal drip, snoring CV:    chest pain, orthopnea, PND, swelling in lower extremities, anasarca,                                  dizziness, palpitations Resp:   shortness of breath with exertion or at rest.                productive cough,   non-productive cough, coughing up of blood.              change in color of mucus.  wheezing.   Skin:    rash or lesions. GI:  No-   heartburn, indigestion, abdominal pain, nausea, vomiting, diarrhea,                 change in bowel habits,  loss of appetite GU: dysuria, change in color of urine, no urgency or frequency.   flank pain. MS:   joint pain, stiffness, decreased range of motion, back pain. Neuro-     nothing unusual Psych:  change in mood or affect.  depression or anxiety.   memory loss.    Objective:   OBJ- Physical Exam       General- Alert, Oriented, Affect-appropriate, Distress- none acute, + obese/big man Skin- rash-none, lesions- none, excoriation- none Lymphadenopathy- none Head- atraumatic            Eyes- Gross vision intact, PERRLA, conjunctivae and secretions clear            Ears- Hearing, canals-normal            Nose- Clear, no-Septal dev, mucus, polyps, erosion, perforation             Throat- Mallampati III , mucosa clear , drainage- none, tonsils- atrophic Neck- flexible , trachea midline, no stridor , thyroid nl, carotid no bruit Chest - symmetrical excursion , unlabored           Heart/CV- RRR/ occ skip , no murmur , no gallop  , no rub, nl s1 s2                           -  JVD- none , edema- none, stasis changes- none, varices- none           Lung- clear to P&A, wheeze- none, cough- none , dullness-none, rub- none           Chest wall-  Abd-  Br/ Gen/ Rectal- Not done, not indicated Extrem- cyanosis- none, clubbing, none, atrophy- none, strength- nl, + healing TKR incision Neuro- grossly intact to observation    Assessment & Plan:

## 2019-07-30 NOTE — Telephone Encounter (Signed)
Tried to contact pt again but no answer and no VM ?

## 2019-08-01 NOTE — Telephone Encounter (Signed)
LVM

## 2019-08-02 DIAGNOSIS — R269 Unspecified abnormalities of gait and mobility: Secondary | ICD-10-CM | POA: Diagnosis not present

## 2019-08-02 DIAGNOSIS — G4733 Obstructive sleep apnea (adult) (pediatric): Secondary | ICD-10-CM | POA: Diagnosis not present

## 2019-08-11 DIAGNOSIS — J209 Acute bronchitis, unspecified: Secondary | ICD-10-CM | POA: Insufficient documentation

## 2019-08-11 NOTE — Assessment & Plan Note (Signed)
Nonspecific infection Plan- Zpak, hydration

## 2019-08-11 NOTE — Assessment & Plan Note (Signed)
Benefits with good compliance and control Plan- continue auto 10-20 with discussion

## 2019-08-11 NOTE — Assessment & Plan Note (Signed)
Emphasize life style toward weight loss

## 2019-08-15 ENCOUNTER — Other Ambulatory Visit: Payer: Self-pay | Admitting: Neurosurgery

## 2019-08-15 DIAGNOSIS — M4316 Spondylolisthesis, lumbar region: Secondary | ICD-10-CM

## 2019-08-23 ENCOUNTER — Other Ambulatory Visit: Payer: Self-pay | Admitting: Internal Medicine

## 2019-08-23 MED ORDER — HYDROCODONE-ACETAMINOPHEN 5-325 MG PO TABS: ORAL_TABLET | ORAL | 0 refills | Status: DC

## 2019-08-23 NOTE — Telephone Encounter (Signed)
Patient called in regards to refill request He stated the pharmacy told him they sent this request to our office Wednesday  HYDROCODONE   Patient stated he is not out of medication but running low    [facility]

## 2019-08-23 NOTE — Telephone Encounter (Signed)
Name of Medication: Hydrocodone apap 5-325 mg Name of Pharmacy:[facility]  Last Fill or Written Date and Quantity:#90 on 07/24/19  Last Office Visit and Type: FU on 06/24/19 Next Office Visit and Type: 3 mth FU on 09/23/19 Last Controlled Substance Agreement Date:01/04/2019 Last UDS:12/05/2018

## 2019-08-26 ENCOUNTER — Telehealth: Payer: Self-pay | Admitting: Internal Medicine

## 2019-08-26 DIAGNOSIS — R220 Localized swelling, mass and lump, head: Secondary | ICD-10-CM

## 2019-08-26 NOTE — Telephone Encounter (Signed)
Please let him know I sent a referral to Dr Ezzard Standing in [place]. Hopefully he will hear from his office soon

## 2019-08-26 NOTE — Telephone Encounter (Signed)
Patient called today  He stated that he was in to see his dentist today and his left lower jaw is swollen. He stated that the dentist done xray and looked in his mouth and it doesn't seem to be a issues with his teeth. This has been going on for 2 weeks now and the dentist advised him to call his primary to see if he needed to be referred to an ENT doctor.  Patient stated that if a referral is needed to go ahead and have the referral sent. And avoid having to come in to see Dr Alphonsus Sias just to turn around an see an ENT

## 2019-08-26 NOTE — Telephone Encounter (Signed)
Spoke to pt. He will call us if he does not hear from anyone by Thursday morning.

## 2019-08-30 DIAGNOSIS — G4733 Obstructive sleep apnea (adult) (pediatric): Secondary | ICD-10-CM | POA: Diagnosis not present

## 2019-08-30 DIAGNOSIS — R269 Unspecified abnormalities of gait and mobility: Secondary | ICD-10-CM | POA: Diagnosis not present

## 2019-09-03 DIAGNOSIS — M5126 Other intervertebral disc displacement, lumbar region: Secondary | ICD-10-CM | POA: Diagnosis not present

## 2019-09-03 DIAGNOSIS — M48061 Spinal stenosis, lumbar region without neurogenic claudication: Secondary | ICD-10-CM | POA: Diagnosis not present

## 2019-09-03 DIAGNOSIS — M4316 Spondylolisthesis, lumbar region: Secondary | ICD-10-CM | POA: Diagnosis not present

## 2019-09-03 DIAGNOSIS — M47816 Spondylosis without myelopathy or radiculopathy, lumbar region: Secondary | ICD-10-CM | POA: Diagnosis not present

## 2019-09-05 ENCOUNTER — Other Ambulatory Visit: Payer: Self-pay

## 2019-09-05 ENCOUNTER — Ambulatory Visit (INDEPENDENT_AMBULATORY_CARE_PROVIDER_SITE_OTHER): Payer: PPO | Admitting: Otolaryngology

## 2019-09-05 ENCOUNTER — Encounter (INDEPENDENT_AMBULATORY_CARE_PROVIDER_SITE_OTHER): Payer: Self-pay | Admitting: Otolaryngology

## 2019-09-05 VITALS — Temp 97.9°F

## 2019-09-05 DIAGNOSIS — L049 Acute lymphadenitis, unspecified: Secondary | ICD-10-CM | POA: Diagnosis not present

## 2019-09-05 NOTE — Progress Notes (Signed)
HPI: Dakota Ellison is a 69 y.o. male who presents is referred by Dr. Alphonsus Sias For evaluation of painful left neck swelling.  Several weeks ago patient apparently developed swelling and pain in the left upper neck at the jawline.  He saw his dentist who did some x-rays and did not find any dental abnormality and suggested follow-up with ENT.  The swelling has subsequently improved over the past 2 weeks and is less painful.  He is referred here for evaluation of left upper neck swelling. Patient does not smoke  Past Medical History:  Diagnosis Date  . Anxiety state 04/08/2014   denies  . Complication of anesthesia    and trouble waking up  . Degenerative disc disease   . Dysthymia    denies  . ED (erectile dysfunction)   . GERD (gastroesophageal reflux disease)   . History of MRSA infection    on lip from cut  . Hyperlipemia   . Hypertension    take BP med for small leak in heart to keep it small per pt.  . Mild asthma    as a teen  . Morbid obesity (HCC)   . OSA (obstructive sleep apnea)   . Osteoarthritis   . PONV (postoperative nausea and vomiting)   . Rectal fissure   . Testosterone deficiency    Past Surgical History:  Procedure Laterality Date  . HERNIA REPAIR     1998  . KNEE SURGERY  09/2010   left knee and right knee arthroscopy  . OTHER SURGICAL HISTORY     arthroscopic subacromial decompression  . OTHER SURGICAL HISTORY     open resection, distal right clavicle  . SHOULDER ARTHROSCOPY     right  . SPHINCTEROTOMY    . TOTAL KNEE ARTHROPLASTY Left 06/11/2018   Procedure: LEFT TOTAL KNEE ARTHROPLASTY;  Surgeon: [name], MD;  Location: [facility];  Service: Orthopedics;  Laterality: Left;   . TOTAL KNEE ARTHROPLASTY Right 12/17/2018   Procedure: TOTAL KNEE ARTHROPLASTY;  Surgeon: [name], MD;  Location: [facility];  Service: Orthopedics;  Laterality: Right;    Social History   Socioeconomic History  . Marital status: Single    Spouse name: Not on  file  . Number of children: 1  . Years of education: Not on file  . Highest education level: Not on file  Occupational History  . Occupation: long Estate agent    Comment: Retired  . 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 Smoking status: Never Smoker  . Smokeless tobacco: Never Used  . Tobacco comment: CIGARS ONLY--QUIT 2013  Substance and Sexual Activity  . Alcohol use: Yes    Alcohol/week: 0.0 standard drinks    Comment: rare//occassional  . Drug use: No  . Sexual activity: Yes  Other Topics Concern  . Not on file  Social History Narrative   Has living will   Son is health care POA   Would accept resuscitation attempts   Likely wouldn't want extended tube feeds if cognitively unaware   Social Determinants of Health   Financial Resource Strain:   . Difficulty of Paying Living Expenses:   Food Insecurity:   . Worried About Programme researcher, broadcasting/film/video in the Last Year:   . Barista in the Last Year:   Transportation Needs:   . Freight forwarder (Medical):   Marland Kitchen Lack of Transportation (Non-Medical):   Physical Activity:   .  Days of Exercise per Week:   . Minutes of Exercise per Session:   Stress:   . Feeling of Stress :   Social Connections:   . Frequency of Communication with Friends and Family:   . Frequency of Social Gatherings with Friends and Family:   . Attends Religious Services:   . Active Member of Clubs or Organizations:   . Attends Banker Meetings:   Marland Kitchen Marital Status:    Family History  Problem Relation Age of Onset  . Pneumonia Brother    Allergies  Allergen Reactions  . Atorvastatin Other (See Comments)    Muscle weakness in his legs  . Crestor [Rosuvastatin] Other (See Comments)    Severe muscle cramping  . Zocor [Simvastatin] Other (See Comments)    Muscle weakness in his legs   Prior to Admission medications   Medication Sig Start Date End Date Taking? Authorizing  Provider  ALPRAZolam Prudy Feeler) 0.5 MG tablet Take 1 tablet (0.5 mg total) by mouth 2 (two) times daily as needed. 06/24/19  Yes [name], MD  amLODIPine-Valsartan-HCTZ [phone] MG TABS Take 1 tablet by mouth daily. 12/05/18  Yes [name], MD  Apple Cider Vinegar 500 MG TABS Take 500 mg by mouth daily.    Yes [provider]  azithromycin (ZITHROMAX) 250 MG tablet 2 today then one daily 07/29/19  Yes Young, Joni Fears D, MD  cetirizine (ZYRTEC) 10 MG tablet Take 10 mg by mouth daily.   Yes [provider]  CINNAMON PO Take 200 mg by mouth daily.   Yes [provider]  ezetimibe (ZETIA) 10 MG tablet Take 1 tablet (10 mg total) by mouth daily. Patient taking differently: Take 10 mg by mouth every evening.  12/05/18  Yes [name], MD  Garlic 2000 MG CAPS Take 2,000 mg by mouth 2 (two) times daily.    Yes [provider]  Ginkgo Biloba 120 MG CAPS Take 120 mg by mouth daily.    Yes [provider]  HYDROcodone-acetaminophen (NORCO/VICODIN) 5-325 MG tablet TAKE 1 TABLET BY MOUTH THREE TIMES DAILYAS NEEDED FOR MODERATE PAIN 08/23/19  Yes [name], MD  loperamide (IMODIUM A-D) 2 MG tablet Take 2 tablets (4 mg total) by mouth 4 (four) times daily as needed for diarrhea or loose stools. 02/04/19  Yes Sharman Cheek, MD  Multiple Vitamin (MULTIVITAMIN WITH MINERALS) TABS tablet Take 1 tablet by mouth daily.   Yes [provider]  niacin 500 MG CR capsule Take 500 mg by mouth 2 (two) times daily.    Yes [provider]  omeprazole (PRILOSEC) 20 MG capsule Take 1 capsule (20 mg total) by mouth daily. Patient taking differently: Take 20 mg by mouth every evening.  12/05/18  Yes [name], MD  sertraline (ZOLOFT) 25 MG tablet TAKE 1 TABLET (25 MG TOTAL) BY MOUTH DAILY. Patient taking differently: Take 25 mg by mouth every evening. TAKE 1 TABLET (25 MG TOTAL) BY MOUTH DAILY. 12/05/18  Yes [name], MD   sildenafil (VIAGRA) 100 MG tablet Take as directed Patient taking differently: Take 100 mg by mouth daily as needed for erectile dysfunction. Take as directed 10/03/11  Yes Nadel, Lonzo Cloud, MD  SYSTANE ULTRA 0.4-0.3 % SOLN Place 1 drop into both eyes 4 (four) times daily as needed (dry/irritated eyes.).   Yes [provider]  testosterone cypionate (DEPOTESTOSTERONE CYPIONATE) 200 MG/ML injection Inject 200 mg into the muscle every 14 (fourteen) days. Saturdays 12/03/18  Yes [provider]  Turmeric (QC TUMERIC COMPLEX PO) Take 1 tablet by mouth daily.   Yes [provider]     Positive ROS: Otherwise negative  All other systems have been reviewed and were otherwise negative with the exception of those mentioned in the HPI and as above.  Physical Exam: Constitutional: Alert, well-appearing, no acute distress Ears: External ears without lesions or tenderness. Ear canals are clear bilaterally with intact, clear TMs bilaterally. Nasal: External nose without lesions. Septum slightly deviated to the right with clear nasal passages otherwise with no signs of infection.  Mild rhinitis with clear mucus discharge. Oral: Lips and gums without lesions. Tongue and palate mucosa without lesions. Posterior oropharynx clear.  Tonsils are symmetric in appearance.  Indirect laryngoscopy revealed a clear base of tongue vallecula and epiglottis. Neck: The swelling that the patient is feeling he was able to palpate in the office in this represents a slightly enlarged lymph node at the border of the mandible just anterior to the angle of the mandible.  Measures approximately 1.8 cm in size and is mobile and slightly tender.  No other palpable adenopathy lower in the neck.  Normal parotid gland to palpation and normal submandibular gland to palpation. Respiratory: Breathing comfortably  Skin: No facial/neck lesions or rash noted.  Procedures  Assessment: Left upper neck lymphadenitis  which is spontaneously improving.  Plan: Reviewed with the patient concerning present findings.  I would expect this to gradually resolve and continue and improve without any specific therapy.  However if it enlarges or becomes more painful I gave him a prescription for Keflex 5 mg 3 times daily to take. He will follow-up as needed any enlargement of the lymph node.   Radene Journey, MD   CC:

## 2019-09-23 ENCOUNTER — Other Ambulatory Visit: Payer: Self-pay

## 2019-09-23 ENCOUNTER — Ambulatory Visit (INDEPENDENT_AMBULATORY_CARE_PROVIDER_SITE_OTHER): Payer: PPO | Admitting: Internal Medicine

## 2019-09-23 ENCOUNTER — Encounter: Payer: Self-pay | Admitting: Internal Medicine

## 2019-09-23 DIAGNOSIS — M48061 Spinal stenosis, lumbar region without neurogenic claudication: Secondary | ICD-10-CM | POA: Diagnosis not present

## 2019-09-23 DIAGNOSIS — F1120 Opioid dependence, uncomplicated: Secondary | ICD-10-CM | POA: Diagnosis not present

## 2019-09-23 MED ORDER — HYDROCODONE-ACETAMINOPHEN 5-325 MG PO TABS: ORAL_TABLET | ORAL | 0 refills | Status: DC

## 2019-09-23 MED ORDER — ALPRAZOLAM 0.5 MG PO TABS: 0.5000 mg | ORAL_TABLET | Freq: Two times a day (BID) | ORAL | 0 refills | Status: DC | PRN

## 2019-09-23 NOTE — Progress Notes (Signed)
Subjective:    Patient ID: Dakota Ellison, male    DOB: Oct 06, 1950, 69 y.o.   MRN: [id number]  HPI Here for follow up of chronic pain and narcotic dependence This visit occurred during the SARS-CoV-2 public health emergency.  Safety protocols were in place, including screening questions prior to the visit, additional usage of staff PPE, and extensive cleaning of exam room while observing appropriate contact time as indicated for disinfecting solutions.   Still has some knee pain--different than before the surgery Can walk better More pain in his back---had another MRI by Dr Wynetta Emery and waiting for results Still uses the hydrocodone tid---helps some but pain is constant  Mood has been okay Usually uses ~20 per month  Current Outpatient Medications on File Prior to Visit  Medication Sig Dispense Refill  . ALPRAZolam (XANAX) 0.5 MG tablet Take 1 tablet (0.5 mg total) by mouth 2 (two) times daily as needed. 60 tablet 0  . amLODIPine-Valsartan-HCTZ 10-320-25 MG TABS Take 1 tablet by mouth daily. 90 tablet 3  . Apple Cider Vinegar 500 MG TABS Take 500 mg by mouth daily.     . cetirizine (ZYRTEC) 10 MG tablet Take 10 mg by mouth daily.    Marland Kitchen CINNAMON PO Take 200 mg by mouth daily.    Marland Kitchen ezetimibe (ZETIA) 10 MG tablet Take 1 tablet (10 mg total) by mouth daily. (Patient taking differently: Take 10 mg by mouth every evening. ) 90 tablet 3  . Garlic 2000 MG CAPS Take 2,000 mg by mouth 2 (two) times daily.     . Ginkgo Biloba 120 MG CAPS Take 120 mg by mouth daily.     Marland Kitchen HYDROcodone-acetaminophen (NORCO/VICODIN) 5-325 MG tablet TAKE 1 TABLET BY MOUTH THREE TIMES DAILYAS NEEDED FOR MODERATE PAIN 90 tablet 0  . loperamide (IMODIUM A-D) 2 MG tablet Take 2 tablets (4 mg total) by mouth 4 (four) times daily as needed for diarrhea or loose stools. 30 tablet 0  . Multiple Vitamin (MULTIVITAMIN WITH MINERALS) TABS tablet Take 1 tablet by mouth daily.    . niacin 500 MG CR capsule Take 500 mg by mouth 2 (two)  times daily.     Marland Kitchen omeprazole (PRILOSEC) 20 MG capsule Take 1 capsule (20 mg total) by mouth daily. (Patient taking differently: Take 20 mg by mouth every evening. ) 90 capsule 3  . sertraline (ZOLOFT) 25 MG tablet TAKE 1 TABLET (25 MG TOTAL) BY MOUTH DAILY. (Patient taking differently: Take 25 mg by mouth every evening. TAKE 1 TABLET (25 MG TOTAL) BY MOUTH DAILY.) 90 tablet 3  . sildenafil (VIAGRA) 100 MG tablet Take as directed (Patient taking differently: Take 100 mg by mouth daily as needed for erectile dysfunction. Take as directed) 10 tablet 5  . SYSTANE ULTRA 0.4-0.3 % SOLN Place 1 drop into both eyes 4 (four) times daily as needed (dry/irritated eyes.).    Marland Kitchen testosterone cypionate (DEPOTESTOSTERONE CYPIONATE) 200 MG/ML injection Inject 200 mg into the muscle every 14 (fourteen) days. Saturdays    . Turmeric (QC TUMERIC COMPLEX PO) Take 1 tablet by mouth daily.     No current facility-administered medications on file prior to visit.    Allergies  Allergen Reactions  . Atorvastatin Other (See Comments)    Muscle weakness in his legs  . Crestor [Rosuvastatin] Other (See Comments)    Severe muscle cramping  . Zocor [Simvastatin] Other (See Comments)    Muscle weakness in his legs    Past Medical History:  Diagnosis Date  . Anxiety state 04/08/2014   denies  . Complication of anesthesia    and trouble waking up  . Degenerative disc disease   . Dysthymia    denies  . ED (erectile dysfunction)   . GERD (gastroesophageal reflux disease)   . History of MRSA infection    on lip from cut  . Hyperlipemia   . Hypertension    take BP med for small leak in heart to keep it small per pt.  . Mild asthma    as a teen  . Morbid obesity ([place])   . OSA (obstructive sleep apnea)   . Osteoarthritis   . PONV (postoperative nausea and vomiting)   . Rectal fissure   . Testosterone deficiency     Past Surgical History:  Procedure Laterality Date  . HERNIA REPAIR     1998  . KNEE SURGERY   09/2010   left knee and right knee arthroscopy  . OTHER SURGICAL HISTORY     arthroscopic subacromial decompression  . OTHER SURGICAL HISTORY     open resection, distal right clavicle  . SHOULDER ARTHROSCOPY     right  . SPHINCTEROTOMY    . TOTAL KNEE ARTHROPLASTY Left 06/11/2018   Procedure: LEFT TOTAL KNEE ARTHROPLASTY;  Surgeon: Gaynelle Arabian, MD;  Location: [facility];  Service: Orthopedics;  Laterality: Left;  73min  . TOTAL KNEE ARTHROPLASTY Right 12/17/2018   Procedure: TOTAL KNEE ARTHROPLASTY;  Surgeon: Gaynelle Arabian, MD;  Location: [facility];  Service: Orthopedics;  Laterality: Right;  35min    Family History  Problem Relation Age of Onset  . Pneumonia Brother     Social History   Socioeconomic History  . Marital status: Single    Spouse name: Not on file  . Number of children: 1  . Years of education: Not on file  . Highest education level: Not on file  Occupational History  . Occupation: long Air traffic controller    Comment: Retired  . Occupation: [employer] part time    Comment: Research scientist (life sciences)  Tobacco Use  . Smoking status: Never Smoker  . Smokeless tobacco: Never Used  . Tobacco comment: CIGARS ONLY--QUIT 2013  Substance and Sexual Activity  . Alcohol use: Yes    Alcohol/week: 0.0 standard drinks    Comment: rare//occassional  . Drug use: No  . Sexual activity: Yes  Other Topics Concern  . Not on file  Social History Narrative   Has living will   Son is health care POA   Would accept resuscitation attempts   Likely wouldn't want extended tube feeds if cognitively unaware   Social Determinants of Health   Financial Resource Strain:   . Difficulty of Paying Living Expenses:   Food Insecurity:   . Worried About Charity fundraiser in the Last Year:   . Arboriculturist in the Last Year:   Transportation Needs:   . Film/video editor (Medical):   Marland Kitchen Lack of Transportation (Non-Medical):   Physical Activity:   . Days of Exercise per Week:     . Minutes of Exercise per Session:   Stress:   . Feeling of Stress :   Social Connections:   . Frequency of Communication with Friends and Family:   . Frequency of Social Gatherings with Friends and Family:   . Attends Religious Services:   . Active Member of Clubs or Organizations:   . Attends Archivist Meetings:   Marland Kitchen Marital Status:  Intimate Partner Violence:   . Fear of Current or Ex-Partner:   . Emotionally Abused:   Marland Kitchen Physically Abused:   . Sexually Abused:    Review of Systems  Still mows on rider Some weed eating Pain comes on with any extended time     Objective:   Physical Exam  Constitutional: He appears well-developed. No distress.  Psychiatric: He has a normal mood and affect. His behavior is normal.           Assessment & Plan:

## 2019-09-23 NOTE — Assessment & Plan Note (Signed)
Reviewed PDMP No concerns 

## 2019-09-23 NOTE — Assessment & Plan Note (Signed)
Has had worsening pain in back Just had repeat MRI--awaiting discussion of results Continues the hydrocodone tid

## 2019-09-26 DIAGNOSIS — M4316 Spondylolisthesis, lumbar region: Secondary | ICD-10-CM | POA: Diagnosis not present

## 2019-09-27 ENCOUNTER — Other Ambulatory Visit: Payer: Self-pay | Admitting: Neurosurgery

## 2019-09-27 DIAGNOSIS — M4316 Spondylolisthesis, lumbar region: Secondary | ICD-10-CM

## 2019-09-30 DIAGNOSIS — G4733 Obstructive sleep apnea (adult) (pediatric): Secondary | ICD-10-CM | POA: Diagnosis not present

## 2019-09-30 DIAGNOSIS — R269 Unspecified abnormalities of gait and mobility: Secondary | ICD-10-CM | POA: Diagnosis not present

## 2019-10-07 ENCOUNTER — Other Ambulatory Visit: Payer: Self-pay

## 2019-10-07 ENCOUNTER — Ambulatory Visit
Admission: RE | Admit: 2019-10-07 | Discharge: 2019-10-07 | Disposition: A | Discharge status: Home / Self Care | Payer: PPO | Source: Ambulatory Visit | Attending: Neurosurgery | Admitting: Neurosurgery

## 2019-10-07 DIAGNOSIS — M4316 Spondylolisthesis, lumbar region: Secondary | ICD-10-CM

## 2019-10-07 MED ORDER — METHYLPREDNISOLONE ACETATE 40 MG/ML INJ SUSP (RADIOLOG
120.0000 mg | Freq: Once | INTRAMUSCULAR | Status: AC
  Administered 2019-10-07: 120 mg via INTRA_ARTICULAR

## 2019-10-07 MED ORDER — IOPAMIDOL (ISOVUE-M 200) INJECTION 41%
1.0000 mL | Freq: Once | INTRAMUSCULAR | Status: AC
  Administered 2019-10-07: 1 mL via INTRA_ARTICULAR

## 2019-10-07 NOTE — Discharge Instructions (Signed)

## 2019-10-23 ENCOUNTER — Other Ambulatory Visit: Payer: Self-pay | Admitting: Internal Medicine

## 2019-10-23 NOTE — Telephone Encounter (Signed)
Name of Medication: Hydrocodone Name of Pharmacy: [facility] Last Fill or Written Date and Quantity: 09-23-19 #90 Last Office Visit and Type: 09-23-19 Next Office Visit and Type: 12-16-19 Last Controlled Substance Agreement Date: 12-05-18 Last UDS: 12-05-18

## 2019-10-30 DIAGNOSIS — R269 Unspecified abnormalities of gait and mobility: Secondary | ICD-10-CM | POA: Diagnosis not present

## 2019-10-30 DIAGNOSIS — G4733 Obstructive sleep apnea (adult) (pediatric): Secondary | ICD-10-CM | POA: Diagnosis not present

## 2019-11-19 DIAGNOSIS — M4316 Spondylolisthesis, lumbar region: Secondary | ICD-10-CM | POA: Diagnosis not present

## 2019-11-20 ENCOUNTER — Other Ambulatory Visit: Payer: Self-pay | Admitting: Internal Medicine

## 2019-11-21 NOTE — Telephone Encounter (Signed)
Name of Medication: Hydrocodone Name of Pharmacy: [facility] Last Fill or Written Date and Quantity: 10-23-19 #90 Last Office Visit and Type: 09-23-19 Next Office Visit and Type: 12-16-19 Last Controlled Substance Agreement Date: 12-05-18 Last UDS: 12-05-18

## 2019-11-30 DIAGNOSIS — R269 Unspecified abnormalities of gait and mobility: Secondary | ICD-10-CM | POA: Diagnosis not present

## 2019-11-30 DIAGNOSIS — G4733 Obstructive sleep apnea (adult) (pediatric): Secondary | ICD-10-CM | POA: Diagnosis not present

## 2019-12-11 DIAGNOSIS — M48061 Spinal stenosis, lumbar region without neurogenic claudication: Secondary | ICD-10-CM | POA: Diagnosis not present

## 2019-12-11 DIAGNOSIS — M5136 Other intervertebral disc degeneration, lumbar region: Secondary | ICD-10-CM | POA: Diagnosis not present

## 2019-12-11 DIAGNOSIS — M4310 Spondylolisthesis, site unspecified: Secondary | ICD-10-CM | POA: Diagnosis not present

## 2019-12-11 DIAGNOSIS — M545 Low back pain: Secondary | ICD-10-CM | POA: Diagnosis not present

## 2019-12-16 ENCOUNTER — Ambulatory Visit: Payer: PPO | Admitting: Internal Medicine

## 2019-12-16 ENCOUNTER — Other Ambulatory Visit: Payer: Self-pay

## 2019-12-16 ENCOUNTER — Encounter: Payer: Self-pay | Admitting: Internal Medicine

## 2019-12-16 VITALS — BP 126/70 | HR 89 | Temp 97.9°F | Ht 72.0 in | Wt 297.0 lb

## 2019-12-16 DIAGNOSIS — F1120 Opioid dependence, uncomplicated: Secondary | ICD-10-CM

## 2019-12-16 DIAGNOSIS — M48062 Spinal stenosis, lumbar region with neurogenic claudication: Secondary | ICD-10-CM

## 2019-12-16 DIAGNOSIS — N281 Cyst of kidney, acquired: Secondary | ICD-10-CM | POA: Diagnosis not present

## 2019-12-16 MED ORDER — ALPRAZOLAM 0.5 MG PO TABS: 0.5000 mg | ORAL_TABLET | Freq: Three times a day (TID) | ORAL | 0 refills | Status: DC | PRN

## 2019-12-16 NOTE — Assessment & Plan Note (Signed)
Ongoing pain Brief improvement with facet injections Still functional ---so surgery not appropriate at this point Hydrocodone tid

## 2019-12-16 NOTE — Assessment & Plan Note (Signed)
PDMP reviewed No concerns 

## 2019-12-16 NOTE — Assessment & Plan Note (Signed)
Told by [facility] MD he has this Will check ultrasound

## 2019-12-16 NOTE — Progress Notes (Signed)
Subjective:    Patient ID: Dakota Ellison, male    DOB: 1950-12-13, 69 y.o.   MRN: [id number]  HPI Here for follow up of chronic back pain and narcotic dependence This visit occurred during the SARS-CoV-2 public health emergency.  Safety protocols were in place, including screening questions prior to the visit, additional usage of staff PPE, and extensive cleaning of exam room while observing appropriate contact time as indicated for disinfecting solutions.   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 ALPRAZolam (XANAX) 0.5 MG tablet Take 1 tablet (0.5 mg total) by mouth 2 (two) times daily as needed. 60 tablet 0  . amLODIPine-Valsartan-HCTZ 10-320-25 MG TABS Take 1 tablet by mouth daily. 90 tablet 3  . Apple Cider Vinegar 500 MG TABS Take 500 mg by mouth daily.     . cetirizine (ZYRTEC) 10 MG tablet Take 10 mg by mouth daily.    Marland Kitchen CINNAMON PO Take 200 mg by mouth daily.    Marland Kitchen ezetimibe (ZETIA) 10 MG tablet Take 1 tablet (10 mg total) by mouth daily. (Patient taking differently: Take 10 mg by mouth every evening. ) 90 tablet 3  . Garlic 2000 MG CAPS Take 2,000 mg by mouth 2 (two) times daily.     . Ginkgo Biloba 120 MG CAPS Take 120 mg by mouth daily.     Marland Kitchen HYDROcodone-acetaminophen (NORCO/VICODIN) 5-325 MG tablet TAKE 1 TABLET BY MOUTH THREE TIMES DAILYAS NEEDED FOR MODERATE PAIN 90  tablet 0  . loperamide (IMODIUM A-D) 2 MG tablet Take 2 tablets (4 mg total) by mouth 4 (four) times daily as needed for diarrhea or loose stools. 30 tablet 0  . Multiple Vitamin (MULTIVITAMIN WITH MINERALS) TABS tablet Take 1 tablet by mouth daily.    . niacin 500 MG CR capsule Take 500 mg by mouth 2 (two) times daily.     Marland Kitchen omeprazole (PRILOSEC) 20 MG capsule Take 1 capsule (20 mg total) by mouth daily. (Patient taking differently: Take 20 mg by mouth every evening. ) 90 capsule 3  . sertraline (ZOLOFT) 25 MG tablet TAKE 1 TABLET (25 MG TOTAL) BY MOUTH DAILY. (Patient taking differently: Take 25 mg by mouth every evening. TAKE 1 TABLET (25 MG TOTAL) BY MOUTH DAILY.) 90 tablet 3  . sildenafil (VIAGRA) 100 MG tablet Take as directed (Patient taking differently: Take 100 mg by mouth daily as needed for erectile dysfunction. Take as directed) 10 tablet 5  . SYSTANE ULTRA 0.4-0.3 % SOLN Place 1 drop into both eyes 4 (four) times daily as needed (dry/irritated eyes.).    Marland Kitchen testosterone cypionate (DEPOTESTOSTERONE CYPIONATE) 200 MG/ML injection Inject 200 mg into the muscle every 14 (fourteen) days. Saturdays    . Turmeric (QC TUMERIC COMPLEX PO) Take 1 tablet by mouth daily.     No current facility-administered medications on file prior to visit.  Allergies  Allergen Reactions  . Atorvastatin Other (See Comments)    Muscle weakness in his legs  . Crestor [Rosuvastatin] Other (See Comments)    Severe muscle cramping  . Zocor [Simvastatin] Other (See Comments)    Muscle weakness in his legs    Past Medical History:  Diagnosis Date  . Anxiety state 04/08/2014   denies  . Complication of anesthesia    and trouble waking up  . Degenerative disc disease   . Dysthymia    denies  . ED (erectile dysfunction)   . GERD (gastroesophageal reflux disease)   . History of MRSA infection    on lip from cut  . Hyperlipemia   . Hypertension    take BP med for small leak in heart to keep it small per  pt.  . Mild asthma    as a teen  . Morbid obesity (HCC)   . OSA (obstructive sleep apnea)   . Osteoarthritis   . PONV (postoperative nausea and vomiting)   . Rectal fissure   . Testosterone deficiency     Past Surgical History:  Procedure Laterality Date  . HERNIA REPAIR     1998  . KNEE SURGERY  09/2010   left knee and right knee arthroscopy  . OTHER SURGICAL HISTORY     arthroscopic subacromial decompression  . OTHER SURGICAL HISTORY     open resection, distal right clavicle  . SHOULDER ARTHROSCOPY     right  . SPHINCTEROTOMY    . TOTAL KNEE ARTHROPLASTY Left 06/11/2018   Procedure: LEFT TOTAL KNEE ARTHROPLASTY;  Surgeon: [name], MD;  Location: [facility];  Service: Orthopedics;  Laterality: Left;   . TOTAL KNEE ARTHROPLASTY Right 12/17/2018   Procedure: TOTAL KNEE ARTHROPLASTY;  Surgeon: [name], MD;  Location: [facility];  Service: Orthopedics;  Laterality: Right;     Family History  Problem Relation Age of Onset  . Pneumonia Brother     Social History   Socioeconomic History  . Marital status: Single    Spouse name: Not on file  . Number of children: 1  . Years of education: Not on file  . Highest education level: Not on file  Occupational History  . Occupation: long Estate agent    Comment: Retired  . 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 Smoking status: Never Smoker  . Smokeless tobacco: Never Used  . Tobacco comment: CIGARS ONLY--QUIT 2013  Vaping Use  . Vaping Use: Never used  Substance and Sexual Activity  . Alcohol use: Yes    Alcohol/week: 0.0 standard drinks    Comment: rare//occassional  . Drug use: No  . Sexual activity: Yes  Other Topics Concern  . Not on file  Social History Narrative   Has living will   Son is health care POA   Would accept resuscitation attempts   Likely wouldn't want extended tube feeds if cognitively unaware   Social Determinants of Health     Financial Resource Strain:   . Difficulty of Paying Living Expenses:   Food Insecurity:   . Worried About Programme researcher, broadcasting/film/video in the Last Year:   . Barista in the Last Year:   Transportation Needs:   . Freight forwarder (Medical):   Marland Kitchen Lack of Transportation (Non-Medical):   Physical Activity:   . Days of Exercise per Week:   . Minutes of Exercise per Session:  Stress:   . Feeling of Stress :   Social Connections:   . Frequency of Communication with Friends and Family:   . Frequency of Social Gatherings with Friends and Family:   . Attends Religious Services:   . Active Member of Clubs or Organizations:   . Attends Banker Meetings:   Marland Kitchen Marital Status:   Intimate Partner Violence:   . Fear of Current or Ex-Partner:   . Emotionally Abused:   Marland Kitchen Physically Abused:   . Sexually Abused:    Review of Systems  Eating okay Sleeping fine---uses CPAP every night     Objective:   Physical Exam         Assessment & Plan:

## 2019-12-18 ENCOUNTER — Ambulatory Visit
Admission: RE | Admit: 2019-12-18 | Discharge: 2019-12-18 | Disposition: A | Discharge status: Home / Self Care | Payer: PPO | Source: Ambulatory Visit | Attending: Internal Medicine | Admitting: Internal Medicine

## 2019-12-18 DIAGNOSIS — N281 Cyst of kidney, acquired: Secondary | ICD-10-CM | POA: Diagnosis not present

## 2019-12-18 DIAGNOSIS — Q600 Renal agenesis, unilateral: Secondary | ICD-10-CM | POA: Diagnosis not present

## 2019-12-18 LAB — DRUG MONITORING, PANEL 8 WITH CONFIRMATION, URINE
6 Acetylmorphine: NEGATIVE ng/mL (ref <10)
Alcohol Metabolites: NEGATIVE ng/mL
Amphetamines: NEGATIVE ng/mL (ref <500)
Benzodiazepines: NEGATIVE ng/mL (ref <100)
Buprenorphine, Urine: NEGATIVE ng/mL (ref <5)
Cocaine Metabolite: NEGATIVE ng/mL (ref <150)
Codeine: NEGATIVE ng/mL (ref <50)
Creatinine: 118.3 mg/dL
Hydrocodone: 584 ng/mL — ABNORMAL HIGH (ref <50)
Hydromorphone: 68 ng/mL — ABNORMAL HIGH (ref <50)
MDMA: NEGATIVE ng/mL (ref <500)
Marijuana Metabolite: NEGATIVE ng/mL (ref <20)
Morphine: NEGATIVE ng/mL (ref <50)
Norhydrocodone: 288 ng/mL — ABNORMAL HIGH (ref <50)
Opiates: POSITIVE ng/mL — AB (ref <100)
Oxidant: NEGATIVE ug/mL
Oxycodone: NEGATIVE ng/mL (ref <100)
pH: 5.1 (ref 4.5–9.0)

## 2019-12-18 LAB — DM TEMPLATE

## 2019-12-19 ENCOUNTER — Other Ambulatory Visit: Payer: Self-pay | Admitting: Internal Medicine

## 2019-12-19 NOTE — Telephone Encounter (Signed)
Name of Medication: Hydrocodone Name of Pharmacy: [facility] Last Fill or Written Date and Quantity: 11-21-19 #90 Last Office Visit and Type: 12-16-19 Next Office Visit and Type: 04-06-20 Last Controlled Substance Agreement Date: 12-16-19 Last UDS: 12-16-19

## 2019-12-25 ENCOUNTER — Telehealth: Payer: Self-pay

## 2019-12-25 NOTE — Telephone Encounter (Signed)
Patient has been notified of these results and he verbalized understanding.

## 2019-12-25 NOTE — Telephone Encounter (Signed)
Pt left v/m requesting cb about results for US Renal done on 12/18/19.

## 2019-12-25 NOTE — Telephone Encounter (Signed)
Left message to call office. Looks like Dr Alphonsus Sias sent the results on MyChart but the pt did not see them.

## 2019-12-30 DIAGNOSIS — G4733 Obstructive sleep apnea (adult) (pediatric): Secondary | ICD-10-CM | POA: Diagnosis not present

## 2019-12-30 DIAGNOSIS — R269 Unspecified abnormalities of gait and mobility: Secondary | ICD-10-CM | POA: Diagnosis not present

## 2020-01-10 ENCOUNTER — Other Ambulatory Visit: Payer: Self-pay | Admitting: Internal Medicine

## 2020-01-10 NOTE — Telephone Encounter (Signed)
Rx sent electronically.  

## 2020-01-14 DIAGNOSIS — M5416 Radiculopathy, lumbar region: Secondary | ICD-10-CM | POA: Diagnosis not present

## 2020-01-14 DIAGNOSIS — M48061 Spinal stenosis, lumbar region without neurogenic claudication: Secondary | ICD-10-CM | POA: Diagnosis not present

## 2020-01-14 DIAGNOSIS — M4310 Spondylolisthesis, site unspecified: Secondary | ICD-10-CM | POA: Diagnosis not present

## 2020-01-16 ENCOUNTER — Other Ambulatory Visit: Payer: Self-pay | Admitting: Internal Medicine

## 2020-01-16 NOTE — Telephone Encounter (Signed)
Last OV-12/16/2119 Last refill--12/19/2019 Next appt--04/06/20  Please advise

## 2020-01-30 DIAGNOSIS — R269 Unspecified abnormalities of gait and mobility: Secondary | ICD-10-CM | POA: Diagnosis not present

## 2020-01-30 DIAGNOSIS — G4733 Obstructive sleep apnea (adult) (pediatric): Secondary | ICD-10-CM | POA: Diagnosis not present

## 2020-02-13 ENCOUNTER — Other Ambulatory Visit: Payer: Self-pay | Admitting: Internal Medicine

## 2020-02-13 NOTE — Telephone Encounter (Signed)
Name of Medication: Hydrocodone Name of Pharmacy: [facility] Last Fill or Written Date and Quantity: 01-16-20 #90 Last Office Visit and Type: 12-16-19 Next Office Visit and Type: 04-06-20 Last Controlled Substance Agreement Date: 12-16-19 Last UDS: 12-16-19

## 2020-02-20 ENCOUNTER — Other Ambulatory Visit: Payer: Self-pay

## 2020-02-20 ENCOUNTER — Telehealth (INDEPENDENT_AMBULATORY_CARE_PROVIDER_SITE_OTHER): Payer: PPO | Admitting: Internal Medicine

## 2020-02-20 ENCOUNTER — Telehealth: Payer: PPO | Admitting: Internal Medicine

## 2020-02-20 ENCOUNTER — Encounter: Payer: Self-pay | Admitting: Internal Medicine

## 2020-02-20 DIAGNOSIS — B349 Viral infection, unspecified: Secondary | ICD-10-CM | POA: Diagnosis not present

## 2020-02-20 DIAGNOSIS — R070 Pain in throat: Secondary | ICD-10-CM | POA: Diagnosis not present

## 2020-02-20 DIAGNOSIS — Z20822 Contact with and (suspected) exposure to covid-19: Secondary | ICD-10-CM | POA: Diagnosis not present

## 2020-02-20 DIAGNOSIS — J029 Acute pharyngitis, unspecified: Secondary | ICD-10-CM | POA: Insufficient documentation

## 2020-02-20 NOTE — Assessment & Plan Note (Signed)
2 days of symptoms Had COVID vaccine but still could be COVID--asked him to get tested if feasible Discussed supportive care---gargles, tylenol, keep up with fluids (even if no appetite). If green sputum continues, would consider empiric antibiotic next week Looks good--doubt he would need monoclonal antibody even if positive--but could consider that

## 2020-02-20 NOTE — Progress Notes (Signed)
Subjective:    Patient ID: Dakota Ellison, male    DOB: December 25, 1950, 69 y.o.   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 ALPRAZolam (XANAX) 0.5 MG tablet Take 1 tablet (0.5 mg total) by mouth 3 (three) times daily as needed. 90 tablet 0  . amLODipine (NORVASC) 10 MG tablet TAKE 1 TABLET BY MOUTH DAILY 90 tablet 3  . Apple Cider Vinegar 500 MG TABS Take 500 mg by mouth daily.     . cetirizine (ZYRTEC) 10 MG tablet Take 10 mg by mouth daily.    Marland Kitchen CINNAMON PO Take 200 mg by mouth daily.    Marland Kitchen ezetimibe (ZETIA) 10 MG tablet TAKE 1 TABLET BY MOUTH DAILY 90 tablet 3  . Garlic 2000 MG CAPS Take 2,000 mg by mouth 2 (two) times daily.     . Ginkgo Biloba 120 MG CAPS Take 120 mg by mouth daily.     Marland Kitchen HYDROcodone-acetaminophen (NORCO/VICODIN) 5-325 MG tablet TAKE 1 TABLET BY MOUTH THREE TIMES DAILYAS NEEDED FOR MODERATE PAIN 90 tablet 0  . loperamide (IMODIUM A-D) 2 MG tablet Take 2 tablets (4 mg total) by mouth 4 (four) times daily as needed for diarrhea or loose stools. 30 tablet 0  . Multiple Vitamin (MULTIVITAMIN WITH MINERALS) TABS tablet Take 1 tablet by mouth daily.    . niacin 500 MG CR capsule Take 500 mg by mouth 2 (two) times daily.     Marland Kitchen omeprazole (PRILOSEC) 20 MG capsule TAKE 1 CAPSULE BY MOUTH DAILY 90 capsule 3  . sertraline (ZOLOFT) 25 MG tablet TAKE 1 TABLET BY MOUTH DAILY 90 tablet 3  . sildenafil (VIAGRA) 100 MG tablet Take as directed  (Patient taking differently: Take 100 mg by mouth daily as needed for erectile dysfunction. Take as directed) 10 tablet 5  . SYSTANE ULTRA 0.4-0.3 % SOLN Place 1 drop into both eyes 4 (four) times daily as needed (dry/irritated eyes.).    Marland Kitchen testosterone cypionate (DEPOTESTOSTERONE CYPIONATE) 200 MG/ML injection Inject 200 mg into the muscle every 14 (fourteen) days. Saturdays    . Turmeric (QC TUMERIC COMPLEX PO) Take 1 tablet by mouth daily.    . valsartan-hydrochlorothiazide (DIOVAN-HCT) 320-25 MG tablet TAKE 1 TABLET BY MOUTH DAILY 90 tablet 3   No current facility-administered medications on file prior to visit.    Allergies  Allergen Reactions  . Atorvastatin Other (See Comments)    Muscle weakness in his legs  . Crestor [Rosuvastatin] Other (See Comments)    Severe muscle cramping  . Zocor [Simvastatin] Other (See Comments)    Muscle weakness in his legs    Past Medical History:  Diagnosis Date  . Anxiety state 04/08/2014   denies  . Complication of anesthesia    and trouble waking up  . Degenerative disc disease   . Dysthymia    denies  . ED (erectile dysfunction)   . GERD (gastroesophageal reflux disease)   . History of MRSA infection  on lip from cut  . Hyperlipemia   . Hypertension    take BP med for small leak in heart to keep it small per pt.  . Mild asthma    as a teen  . Morbid obesity (HCC)   . OSA (obstructive sleep apnea)   . Osteoarthritis   . PONV (postoperative nausea and vomiting)   . Rectal fissure   . Testosterone deficiency     Past Surgical History:  Procedure Laterality Date  . HERNIA REPAIR     1998  . KNEE SURGERY  09/2010   left knee and right knee arthroscopy  . OTHER SURGICAL HISTORY     arthroscopic subacromial decompression  . OTHER SURGICAL HISTORY     open resection, distal right clavicle  . SHOULDER ARTHROSCOPY     right  . SPHINCTEROTOMY    . TOTAL KNEE ARTHROPLASTY Left 06/11/2018   Procedure: LEFT TOTAL KNEE  ARTHROPLASTY;  Surgeon: [name], MD;  Location: [facility];  Service: Orthopedics;  Laterality: Left;   . TOTAL KNEE ARTHROPLASTY Right 12/17/2018   Procedure: TOTAL KNEE ARTHROPLASTY;  Surgeon: [name], MD;  Location: [facility];  Service: Orthopedics;  Laterality: Right;     Family History  Problem Relation Age of Onset  . Pneumonia Brother     Social History   Socioeconomic History  . Marital status: Single    Spouse name: Not on file  . Number of children: 1  . Years of education: Not on file  . Highest education level: Not on file  Occupational History  . Occupation: long Estate agent    Comment: Retired  . 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 Smoking status: Never Smoker  . Smokeless tobacco: Never Used  . Tobacco comment: CIGARS ONLY--QUIT 2013  Vaping Use  . Vaping Use: Never used  Substance and Sexual Activity  . Alcohol use: Yes    Alcohol/week: 0.0 standard drinks    Comment: rare//occassional  . Drug use: No  . Sexual activity: Yes  Other Topics Concern  . Not on file  Social History Narrative   Has living will   Son is health care POA   Would accept resuscitation attempts   Likely wouldn't want extended tube feeds if cognitively unaware   Social Determinants of Health   Financial Resource Strain:   . Difficulty of Paying Living Expenses: Not on file  Food Insecurity:   . Worried About Programme researcher, broadcasting/film/video in the Last Year: Not on file  . Ran Out of Food in the Last Year: Not on file  Transportation Needs:   . Lack of Transportation (Medical): Not on file  . Lack of Transportation (Non-Medical): Not on file  Physical Activity:   . Days of Exercise per Week: Not on file  . Minutes of Exercise per Session: Not on file  Stress:   . Feeling of Stress : Not on file  Social Connections:   . Frequency of Communication with Friends and Family: Not on file  . Frequency of Social  Gatherings with Friends and Family: Not on file  . Attends Religious Services: Not on file  . Active Member of Clubs or Organizations: Not on file  . Attends Banker Meetings: Not on file  . Marital Status: Not on file  Intimate Partner Violence:   . Fear of Current or Ex-Partner: Not on file  . Emotionally Abused: Not on  file  . Physically Abused: Not on file  . Sexually Abused: Not on file    Review of Systems Still able to eat---but appetite is off No change in taste or smell Was around granddaughters 2 days before his symptoms started (no symptoms)    Objective:   Physical Exam Constitutional:      Appearance: Normal appearance.  HENT:     Mouth/Throat:     Comments: Slight view in throat (with flashlight) ---no clear swelling posteriorly Pulmonary:     Effort: Pulmonary effort is normal. No respiratory distress.  Neurological:     Mental Status: He is alert.  Psychiatric:        Mood and Affect: Mood normal.            Assessment & Plan:

## 2020-02-24 ENCOUNTER — Telehealth: Payer: Self-pay | Admitting: *Deleted

## 2020-02-24 NOTE — Telephone Encounter (Signed)
Patient left a voicemail stating that he did a visit with Dr. Alphonsus Sias last week because of a sore throat and difficulty swallowing. Patient stated that he was told to call back today if he was still having symptoms. Patient stated that he has been doing what he was advised to do and is still having the sore throat and it is hard to swallow.  Called patient and was advised that after his video visit with Dr. Alphonsus Sias he went to [facility] in [facility] and saw a provider there. Patient stated that they did a rapid covid and strep test and it was negative. Patient stated the provider there told him that his throat looked terrible. Patient stated that they also sent a strep and covid test off and he got a call back today that they were negative also. Patient stated that he was given Prednisone at the Urgent Care and he finished that today. Patient stated that his throat is still real sore and he is coughing up green stuff.. Patient denies SOB, fever or difficulty breathing. Patient was given ER precautions and he verbalized understanding. Pharmacy [facility]

## 2020-02-25 MED ORDER — AMOXICILLIN 500 MG PO TABS
1000.0000 mg | ORAL_TABLET | Freq: Two times a day (BID) | ORAL | 0 refills | Status: AC
Start: 2020-02-25 — End: 2020-03-03

## 2020-02-25 NOTE — Telephone Encounter (Signed)
Left message on VM for pt per DPR 

## 2020-02-25 NOTE — Telephone Encounter (Signed)
Please let him know I sent amoxicillin for him to try----in case he has a sinus infection.

## 2020-02-26 NOTE — Telephone Encounter (Signed)
Pt left v/m after 4 PM that he did video visit on 02/20/20; had quick and long test for covid and strep test and they were negative; and abx was sent to pharmacy on 02/25/20 due to S/T and prod cough with green mucus. Pt said on 02/25/20 pt started with both eyes being red with mucusy drainage;  Now both eyes are fiery red and still mucusy drainage from both eyes. Pt went to [facility] to get something OTC for eyes and pharmacist advised pt needs bacterial eye drops. Pt request eye drops be sent to [facility].

## 2020-02-26 NOTE — Telephone Encounter (Signed)
Pt called and said his pharmacist says he appears to have a bacterial infection in his eyes and states he needs an antibiotic eye drop for his eyes due to all the mucous and redness.  [facility], [place], [place].  Pt req c/b [id number]  Thank you!

## 2020-02-27 MED ORDER — SULFACETAMIDE SODIUM 10 % OP SOLN: 2.0000 [drp] | Freq: Four times a day (QID) | OPHTHALMIC | 0 refills | Status: DC

## 2020-02-27 NOTE — Telephone Encounter (Signed)
Please let him know I sent the Rx 

## 2020-02-27 NOTE — Addendum Note (Signed)
Addended by: Tillman Abide I on: 02/27/2020 12:03 PM   Modules accepted: Orders

## 2020-02-28 NOTE — Telephone Encounter (Signed)
Left message on VM apologizing for the late call that the medication had been called in for him.

## 2020-03-01 DIAGNOSIS — R269 Unspecified abnormalities of gait and mobility: Secondary | ICD-10-CM | POA: Diagnosis not present

## 2020-03-01 DIAGNOSIS — G4733 Obstructive sleep apnea (adult) (pediatric): Secondary | ICD-10-CM | POA: Diagnosis not present

## 2020-03-13 ENCOUNTER — Other Ambulatory Visit: Payer: Self-pay | Admitting: Internal Medicine

## 2020-03-13 NOTE — Telephone Encounter (Signed)
Name of Medication:Hydrocodone Name of Pharmacy:[facility] Last Fill or Written Date and Quantity:02-13-20 #90 Last Office Visit and Type:02-20-20  Acute Next Office Visit and Type:04-06-20 Last Controlled Substance Agreement Date:12-16-19 Last UDS:12-16-19  Alprazolam 12-16-19 #90

## 2020-03-27 NOTE — Progress Notes (Signed)
Cardiology Office Note  Date:  03/30/2020   ID:  Koleson, Reifsteck 1950-11-26, MRN [id number]  PCP:  [name], MD   Chief Complaint  Patient presents with  . Other    12 month follow up. Meds reviewed verbally with patient.     HPI:  Mr. Voth is a pleasant 69 year old gentleman with a history of  obesity,  obstructive sleep apnea,  Hypertension, hyperlipidemia  CT coronary calcium score October 2019 of 0 No aortic atherosclerosis who presents for routine followup of his hypertension and hyperlipidemia  Chronic back issues, DJD, Declined surgery Had cortisone at [facility]  He is able to tolerate his CPAP.  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[employer], [employer] system Plays in [employer]  Mass on kidney Being monitored  Prior events from last year, covid in Aug 2020 Treated with steroids through ER  "weird taste", still not back all the way  Chronic orthopedic issues, back and knee bilaterally B/l knee replacements  06/2018 (l), 12/2018 (r)  EKG personally reviewed by myself on todays visit Shows normal sinus rhythm rate 78 bpm no significant ST or T wave changes PVC  Problems tolerating statins including Crestor, simvastatin, Lipitor Able to tolerate Zetia Previously with myalgias on the others  Other past medical history previous lower extremity ultrasound. By his report, this was normal with no evidence of PAD.  Last echocardiogram in March 2008 showing normal systolic function, right ventricle mildly dilated, mild mitral regurg, mild tricuspid regurg. Last stress test in March 2008 where he achieved 10 mets, exercised for 9 minutes, no ischemia noted. Overall a normal,  low-risk scan.    PMH:   has a past medical history of Anxiety state (04/08/2014), Complication of anesthesia, Degenerative disc disease, Dysthymia, ED (erectile dysfunction), GERD (gastroesophageal reflux disease), History of MRSA  infection, Hyperlipemia, Hypertension, Mild asthma, Morbid obesity (HCC), OSA (obstructive sleep apnea), Osteoarthritis, PONV (postoperative nausea and vomiting), Rectal fissure, and Testosterone deficiency.  PSH:    Past Surgical History:  Procedure Laterality Date  . HERNIA REPAIR     1998  . KNEE SURGERY  09/2010   left knee and right knee arthroscopy  . OTHER SURGICAL HISTORY     arthroscopic subacromial decompression  . OTHER SURGICAL HISTORY     open resection, distal right clavicle  . SHOULDER ARTHROSCOPY     right  . SPHINCTEROTOMY    . TOTAL KNEE ARTHROPLASTY Left 06/11/2018   Procedure: LEFT TOTAL KNEE ARTHROPLASTY;  Surgeon: [name], MD;  Location: [facility];  Service: Orthopedics;  Laterality: Left;   . TOTAL KNEE ARTHROPLASTY Right 12/17/2018   Procedure: TOTAL KNEE ARTHROPLASTY;  Surgeon: [name], MD;  Location: [facility];  Service: Orthopedics;  Laterality: Right;     Current Outpatient Medications  Medication Sig Dispense Refill  . ALPRAZolam (XANAX) 0.5 MG tablet TAKE 1 TABLET BY MOUTH THREE TIMES DAILYAS NEEDED 90 tablet 0  . amLODipine (NORVASC) 10 MG tablet TAKE 1 TABLET BY MOUTH DAILY 90 tablet 3  . Apple Cider Vinegar 500 MG TABS Take 500 mg by mouth daily.     . cetirizine (ZYRTEC) 10 MG tablet Take 10 mg by mouth daily.    Marland Kitchen CINNAMON PO Take 200 mg by mouth daily.    Marland Kitchen ezetimibe (ZETIA) 10 MG tablet TAKE 1 TABLET BY MOUTH DAILY 90 tablet 3  . Garlic 2000 MG CAPS Take 2,000 mg  by mouth 2 (two) times daily.     . Ginkgo Biloba 120 MG CAPS Take 120 mg by mouth daily.     Marland Kitchen HYDROcodone-acetaminophen (NORCO/VICODIN) 5-325 MG tablet TAKE 1 TABLET BY MOUTH THREE TIMES DAILYAS NEEDED FOR MODERATE PAIN 90 tablet 0  . loperamide (IMODIUM A-D) 2 MG tablet Take 2 tablets (4 mg total) by mouth 4 (four) times daily as needed for diarrhea or loose stools. 30 tablet 0  . Multiple Vitamin (MULTIVITAMIN WITH MINERALS) TABS tablet Take 1 tablet by mouth daily.     . niacin 500 MG CR capsule Take 500 mg by mouth 2 (two) times daily.     Marland Kitchen omeprazole (PRILOSEC) 20 MG capsule TAKE 1 CAPSULE BY MOUTH DAILY 90 capsule 3  . sertraline (ZOLOFT) 25 MG tablet TAKE 1 TABLET BY MOUTH DAILY 90 tablet 3  . sildenafil (VIAGRA) 100 MG tablet Take as directed (Patient taking differently: Take 100 mg by mouth daily as needed for erectile dysfunction. Take as directed) 10 tablet 5  . sulfacetamide (BLEPH-10) 10 % ophthalmic solution Place 2 drops into both eyes 4 (four) times daily. 15 mL 0  . SYSTANE ULTRA 0.4-0.3 % SOLN Place 1 drop into both eyes 4 (four) times daily as needed (dry/irritated eyes.).    Marland Kitchen testosterone cypionate (DEPOTESTOSTERONE CYPIONATE) 200 MG/ML injection Inject 200 mg into the muscle every 14 (fourteen) days. Saturdays    . Turmeric (QC TUMERIC COMPLEX PO) Take 1 tablet by mouth daily.    . valsartan-hydrochlorothiazide (DIOVAN-HCT) 320-25 MG tablet TAKE 1 TABLET BY MOUTH DAILY 90 tablet 3   No current facility-administered medications for this visit.     Allergies:   Atorvastatin, Crestor [rosuvastatin], and Zocor [simvastatin]   Social History:  The patient  reports that he has never smoked. He has never used smokeless tobacco. He reports current alcohol use. He reports that he does not use drugs.   Family History:   family history includes Pneumonia in his brother.    Review of Systems: Review of Systems  Constitutional: Negative.   Respiratory: Negative.   Cardiovascular: Negative.   Gastrointestinal: Negative.   Musculoskeletal: Positive for back pain and joint pain.  Neurological: Negative.   Psychiatric/Behavioral: Negative.   All other systems reviewed and are negative.   PHYSICAL EXAM: VS:  BP 120/68 (BP Location: Left Arm, Patient Position: Sitting, Cuff Size: Large)   Pulse 78   Ht 6' (1.829 m)   Wt 288 lb (130.6 kg)   SpO2 96%   BMI 39.06 kg/m  , BMI Body mass index is 39.06 kg/m. Constitutional:  oriented to  person, place, and time. No distress.  HENT:  Head: Grossly normal Eyes:  no discharge. No scleral icterus.  Neck: No JVD, no carotid bruits  Cardiovascular: Regular rate and rhythm, no murmurs appreciated Pulmonary/Chest: Clear to auscultation bilaterally, no wheezes or rails Abdominal: Soft.  no distension.  no tenderness.  Musculoskeletal: Normal range of motion Neurological:  normal muscle tone. Coordination normal. No atrophy Skin: Skin warm and dry Psychiatric: normal affect, pleasant   Recent Labs: No results found for requested labs within last 8760 hours.    Lipid Panel Lab Results  Component Value Date   CHOL 158 10/09/2017   HDL 42.20 10/09/2017   LDLCALC 61 10/08/2015   TRIG 240.0 (H) 10/09/2017      Wt Readings from Last 3 Encounters:  03/30/20 288 lb (130.6 kg)  02/20/20 285 lb (129.3 kg)  12/16/19 297 lb (134.7 kg)  ASSESSMENT AND PLAN:   Essential hypertension - Plan: EKG 12-Lead Blood pressure is well controlled on today's visit. No changes made to the medications.  Pure hypercholesterolemia Myalgias on statin He is tolerating Zetia 10 mg daily Calcium score of 0 No aortic atherosclerosis  Obstructive sleep apnea on CPAP Tolerating his CPAP, compliant Prior Covid infection Recovered  Chronic back pain Recent cortisone shot at [facility]    Total encounter time more than 25 minutes  Greater than 50% was spent in counseling and coordination of care with the patient   No orders of the defined types were placed in this encounter.    Signed, Dossie Arbour, M.D., Ph.D. 03/30/2020  [facility] [place], [place] [phone]

## 2020-03-30 ENCOUNTER — Encounter: Payer: Self-pay | Admitting: Cardiovascular Disease

## 2020-03-30 ENCOUNTER — Ambulatory Visit: Payer: PPO | Admitting: Cardiovascular Disease

## 2020-03-30 ENCOUNTER — Other Ambulatory Visit: Payer: Self-pay

## 2020-03-30 VITALS — BP 120/68 | HR 78 | Ht 72.0 in | Wt 288.0 lb

## 2020-03-30 DIAGNOSIS — R0789 Other chest pain: Secondary | ICD-10-CM

## 2020-03-30 DIAGNOSIS — I10 Essential (primary) hypertension: Secondary | ICD-10-CM | POA: Diagnosis not present

## 2020-03-30 DIAGNOSIS — E782 Mixed hyperlipidemia: Secondary | ICD-10-CM

## 2020-03-30 DIAGNOSIS — G4733 Obstructive sleep apnea (adult) (pediatric): Secondary | ICD-10-CM | POA: Diagnosis not present

## 2020-03-30 NOTE — Patient Instructions (Signed)
Medication Instructions:  No changes  If you need a refill on your cardiac medications before your next appointment, please call your pharmacy.    Lab work: No new labs needed   If you have labs (blood work) drawn today and your tests are completely normal, you will receive your results only by: . MyChart Message (if you have MyChart) OR . A paper copy in the mail If you have any lab test that is abnormal or we need to change your treatment, we will call you to review the results.   Testing/Procedures: No new testing needed   Follow-Up: At [facility], you and your health needs are our priority.  As part of our continuing mission to provide you with exceptional heart care, we have created designated Provider Care Teams.  These Care Teams include your primary Cardiologist (physician) and Advanced Practice Providers (APPs -  Physician Assistants and Nurse Practitioners) who all work together to provide you with the care you need, when you need it.  . You will need a follow up appointment in 12 months  . Providers on your designated Care Team:   . Christopher Berge, NP . Ryan Dunn, PA-C . Jacquelyn Visser, PA-C  Any Other Special Instructions Will Be Listed Below (If Applicable).  COVID-19 Vaccine Information can be found at: [email or web address] For questions related to vaccine distribution or appointments, please email [email or web address] or call [phone].     

## 2020-03-31 DIAGNOSIS — R269 Unspecified abnormalities of gait and mobility: Secondary | ICD-10-CM | POA: Diagnosis not present

## 2020-03-31 DIAGNOSIS — G4733 Obstructive sleep apnea (adult) (pediatric): Secondary | ICD-10-CM | POA: Diagnosis not present

## 2020-04-03 DIAGNOSIS — R972 Elevated prostate specific antigen [PSA]: Secondary | ICD-10-CM | POA: Diagnosis not present

## 2020-04-03 DIAGNOSIS — N5201 Erectile dysfunction due to arterial insufficiency: Secondary | ICD-10-CM | POA: Diagnosis not present

## 2020-04-03 DIAGNOSIS — E291 Testicular hypofunction: Secondary | ICD-10-CM | POA: Diagnosis not present

## 2020-04-06 ENCOUNTER — Ambulatory Visit: Payer: PPO | Admitting: Internal Medicine

## 2020-04-08 DIAGNOSIS — G4733 Obstructive sleep apnea (adult) (pediatric): Secondary | ICD-10-CM | POA: Diagnosis not present

## 2020-04-17 ENCOUNTER — Other Ambulatory Visit: Payer: Self-pay | Admitting: Internal Medicine

## 2020-04-17 NOTE — Telephone Encounter (Signed)
Name of Medication:Hydrocodone Name of Pharmacy:[facility] Last Fill or Written Date and Quantity:03-15-20 #90 Last Office Visit and Type:02-20-20  Acute Next Office Visit and Type:04-24-20 Last Controlled Substance Agreement Date:12-16-19 Last UDS:12-16-19

## 2020-04-24 ENCOUNTER — Encounter: Payer: Self-pay | Admitting: Internal Medicine

## 2020-04-24 ENCOUNTER — Ambulatory Visit (INDEPENDENT_AMBULATORY_CARE_PROVIDER_SITE_OTHER): Payer: PPO | Admitting: Internal Medicine

## 2020-04-24 ENCOUNTER — Other Ambulatory Visit: Payer: Self-pay

## 2020-04-24 VITALS — BP 108/70 | HR 79 | Temp 97.4°F | Ht 70.25 in | Wt 284.0 lb

## 2020-04-24 DIAGNOSIS — K219 Gastro-esophageal reflux disease without esophagitis: Secondary | ICD-10-CM

## 2020-04-24 DIAGNOSIS — Z7189 Other specified counseling: Secondary | ICD-10-CM

## 2020-04-24 DIAGNOSIS — F39 Unspecified mood [affective] disorder: Secondary | ICD-10-CM | POA: Diagnosis not present

## 2020-04-24 DIAGNOSIS — I10 Essential (primary) hypertension: Secondary | ICD-10-CM | POA: Diagnosis not present

## 2020-04-24 DIAGNOSIS — E785 Hyperlipidemia, unspecified: Secondary | ICD-10-CM | POA: Diagnosis not present

## 2020-04-24 DIAGNOSIS — Z0000 Encounter for general adult medical examination without abnormal findings: Secondary | ICD-10-CM

## 2020-04-24 DIAGNOSIS — M48061 Spinal stenosis, lumbar region without neurogenic claudication: Secondary | ICD-10-CM

## 2020-04-24 LAB — CBC
HCT: 47 % (ref 39.0–52.0)
Hemoglobin: 15.9 g/dL (ref 13.0–17.0)
MCHC: 33.9 g/dL (ref 30.0–36.0)
MCV: 84.4 fl (ref 78.0–100.0)
Platelets: 274 10*3/uL (ref 150.0–400.0)
RBC: 5.57 Mil/uL (ref 4.22–5.81)
RDW: 13.8 % (ref 11.5–15.5)
WBC: 8.4 10*3/uL (ref 4.0–10.5)

## 2020-04-24 LAB — COMPREHENSIVE METABOLIC PANEL
ALT: 36 U/L (ref 0–53)
AST: 33 U/L (ref 0–37)
Albumin: 4.3 g/dL (ref 3.5–5.2)
Alkaline Phosphatase: 74 U/L (ref 39–117)
BUN: 17 mg/dL (ref 6–23)
CO2: 30 mEq/L (ref 19–32)
Calcium: 9.8 mg/dL (ref 8.4–10.5)
Chloride: 100 mEq/L (ref 96–112)
Creatinine, Ser: 1.06 mg/dL (ref 0.40–1.50)
GFR: 72.07 mL/min (ref >60.00)
Glucose, Bld: 107 mg/dL — ABNORMAL HIGH (ref 70–99)
Potassium: 3.9 mEq/L (ref 3.5–5.1)
Sodium: 138 mEq/L (ref 135–145)
Total Bilirubin: 0.6 mg/dL (ref 0.2–1.2)
Total Protein: 7.4 g/dL (ref 6.0–8.3)

## 2020-04-24 LAB — LIPID PANEL
Cholesterol: 131 mg/dL (ref 0–200)
HDL: 29.8 mg/dL — ABNORMAL LOW (ref >39.00)
LDL Cholesterol: 62 mg/dL (ref 0–99)
NonHDL: 100.73
Total CHOL/HDL Ratio: 4
Triglycerides: 192 mg/dL — ABNORMAL HIGH (ref 0.0–149.0)
VLDL: 38.4 mg/dL (ref 0.0–40.0)

## 2020-04-24 MED ORDER — TRIAMCINOLONE ACETONIDE 0.1 % EX CREA
1.0000 | TOPICAL_CREAM | Freq: Two times a day (BID) | CUTANEOUS | 1 refills | Status: DC | PRN
Start: 2020-04-24 — End: 2023-08-07

## 2020-04-24 NOTE — Assessment & Plan Note (Signed)
On zetia.  

## 2020-04-24 NOTE — Assessment & Plan Note (Signed)
Dysthymia and anxiety controlled with low dose sertraline and xananx

## 2020-04-24 NOTE — Assessment & Plan Note (Signed)
See social history 

## 2020-04-24 NOTE — Assessment & Plan Note (Signed)
BP Readings from Last 3 Encounters:  04/24/20 108/70  03/30/20 120/68  12/16/19 126/70   Good control on amlodipine and valsartan/HCTZ Will check labs again

## 2020-04-24 NOTE — Assessment & Plan Note (Signed)
Chronic pain Ongoing evaluation and may be considering surgery Is on the hydrocodone

## 2020-04-24 NOTE — Progress Notes (Signed)
Hearing Screening   125Hz  250Hz  500Hz  1000Hz  2000Hz  3000Hz  4000Hz  6000Hz  8000Hz   Right ear:           Left ear:           Comments: November 2021  Vision Screening Comments: November 2021

## 2020-04-24 NOTE — Assessment & Plan Note (Signed)
Controlled with the omeprazole 

## 2020-04-24 NOTE — Assessment & Plan Note (Signed)
Discussed avoiding sugared drinks and eating healthier

## 2020-04-24 NOTE — Assessment & Plan Note (Signed)
I have personally reviewed the Medicare Annual Wellness questionnaire and have noted 1. The patient's medical and social history 2. Their use of alcohol, tobacco or illicit drugs 3. Their current medications and supplements 4. The patient's functional ability including ADL's, fall risks, home safety risks and hearing or visual             impairment. 5. Diet and physical activities 6. Evidence for depression or mood disorders  The patients weight, height, BMI and visual acuity have been recorded in the chart I have made referrals, counseling and provided education to the patient based review of the above and I have provided the pt with a written personalized care plan for preventive services.  I have provided you with a copy of your personalized plan for preventive services. Please take the time to review along with your updated medication list.  Just had colon--due again in 2026 PSA okay at urologist Discussed exercise Had flu vaccine but needs COVID booster

## 2020-04-24 NOTE — Progress Notes (Signed)
Subjective:    Patient ID: Dakota Ellison, male    DOB: 14-Feb-1951, 69 y.o.   MRN: [id number]  HPI Here for Medicare wellness visit and follow up of chronic health conditions This visit occurred during the SARS-CoV-2 public health emergency.  Safety protocols were in place, including screening questions prior to the visit, additional usage of staff PPE, and extensive cleaning of exam room while observing appropriate contact time as indicated for disinfecting solutions.   Reviewed advanced directives Reviewed other doctors----Dr Richardson--orthopedic spine surgery, Dr Cram--neurosurgeon, Dr Marga Melnick, Dr Gollan--cardiologist, Dr Collier Flowers, Dr Angela Burke No hospitalizations or surgery in the past year Very rare beer. 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 ALPRAZolam (XANAX) 0.5 MG tablet TAKE 1 TABLET BY MOUTH THREE TIMES DAILYAS NEEDED 90 tablet 0  . amLODipine (NORVASC) 10 MG tablet TAKE 1 TABLET BY MOUTH DAILY 90 tablet 3  . Apple Cider Vinegar 500 MG TABS Take 500 mg by mouth daily.     . cetirizine (ZYRTEC) 10 MG tablet Take 10 mg by mouth daily.    Marland Kitchen CINNAMON PO Take 200 mg by mouth daily.    Marland Kitchen ezetimibe (ZETIA) 10 MG tablet TAKE 1 TABLET  BY MOUTH DAILY 90 tablet 3  . Garlic 2000 MG CAPS Take 2,000 mg by mouth 2 (two) times daily.     . Ginkgo Biloba 120 MG CAPS Take 120 mg by mouth daily.     Marland Kitchen HYDROcodone-acetaminophen (NORCO/VICODIN) 5-325 MG tablet TAKE 1 TABLET BY MOUTH THREE TIMES DAILYAS NEEDED FOR MODERATE PAIN 90 tablet 0  . loperamide (IMODIUM A-D) 2 MG tablet Take 2 tablets (4 mg total) by mouth 4 (four) times daily as needed for diarrhea or loose stools. 30 tablet 0  . Multiple Vitamin (MULTIVITAMIN WITH MINERALS) TABS tablet Take 1 tablet by mouth daily.    . niacin 500 MG CR capsule Take 500 mg by mouth 2 (two) times daily.     Marland Kitchen omeprazole (PRILOSEC) 20 MG capsule TAKE 1 CAPSULE BY MOUTH DAILY 90 capsule 3  . sertraline (ZOLOFT) 25 MG tablet TAKE 1 TABLET BY MOUTH DAILY 90 tablet 3  . sildenafil (VIAGRA) 100 MG tablet Take as directed (Patient taking differently: Take 100 mg by mouth daily as needed for erectile dysfunction. Take as directed) 10 tablet 5  . sulfacetamide (BLEPH-10) 10 % ophthalmic solution Place 2 drops into both eyes 4 (four) times daily. 15 mL 0  . SYSTANE ULTRA 0.4-0.3 % SOLN Place 1 drop into both eyes 4 (four) times daily as needed (dry/irritated eyes.).    Marland Kitchen testosterone cypionate (DEPOTESTOSTERONE CYPIONATE) 200 MG/ML injection Inject 200 mg into the muscle every 14 (fourteen) days. Saturdays    . Turmeric (QC TUMERIC COMPLEX PO) Take 1 tablet by mouth daily.    Marland Kitchen  valsartan-hydrochlorothiazide (DIOVAN-HCT) 320-25 MG tablet TAKE 1 TABLET BY MOUTH DAILY 90 tablet 3   No current facility-administered medications on file prior to visit.    Allergies  Allergen Reactions  . Atorvastatin Other (See Comments)    Muscle weakness in his legs  . Crestor [Rosuvastatin] Other (See Comments)    Severe muscle cramping  . Zocor [Simvastatin] Other (See Comments)    Muscle weakness in his legs    Past Medical History:  Diagnosis Date  . Anxiety state 04/08/2014   denies  . Complication of  anesthesia    and trouble waking up  . Degenerative disc disease   . Dysthymia    denies  . ED (erectile dysfunction)   . GERD (gastroesophageal reflux disease)   . History of MRSA infection    on lip from cut  . Hyperlipemia   . Hypertension    take BP med for small leak in heart to keep it small per pt.  . Mild asthma    as a teen  . Morbid obesity (HCC)   . OSA (obstructive sleep apnea)   . Osteoarthritis   . PONV (postoperative nausea and vomiting)   . Rectal fissure   . Testosterone deficiency     Past Surgical History:  Procedure Laterality Date  . HERNIA REPAIR     1998  . KNEE SURGERY  09/2010   left knee and right knee arthroscopy  . OTHER SURGICAL HISTORY     arthroscopic subacromial decompression  . OTHER SURGICAL HISTORY     open resection, distal right clavicle  . SHOULDER ARTHROSCOPY     right  . SPHINCTEROTOMY    . TOTAL KNEE ARTHROPLASTY Left 06/11/2018   Procedure: LEFT TOTAL KNEE ARTHROPLASTY;  Surgeon: [name], MD;  Location: [facility];  Service: Orthopedics;  Laterality: Left;   . TOTAL KNEE ARTHROPLASTY Right 12/17/2018   Procedure: TOTAL KNEE ARTHROPLASTY;  Surgeon: [name], MD;  Location: [facility];  Service: Orthopedics;  Laterality: Right;     Family History  Problem Relation Age of Onset  . Pneumonia Brother     Social History   Socioeconomic History  . Marital status: Single    Spouse name: Not on file  . Number of children: 1  . Years of education: Not on file  . Highest education level: Not on file  Occupational History  . Occupation: long Estate agent    Comment: Retired  . 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 Smoking status: Never Smoker  . Smokeless tobacco: Never Used  . Tobacco comment: CIGARS ONLY--QUIT 2013  Vaping Use  . Vaping Use: Never used  Substance and Sexual Activity  . Alcohol use: Yes    Alcohol/week: 0.0 standard drinks     Comment: rare//occassional  . Drug use: No  . Sexual activity: Yes  Other Topics Concern  . Not on file  Social History Narrative   Has living will   Son is health care POA   Would accept resuscitation attempts   Likely wouldn't want extended tube feeds if cognitively unaware   Social Determinants of Health   Financial Resource Strain:   . Difficulty of Paying Living Expenses: Not on file  Food Insecurity:   . Worried About Programme researcher, broadcasting/film/video in the Last Year: Not on file  . Ran Out of Food in the Last Year: Not on file  Transportation Needs:   .  Lack of Transportation (Medical): Not on file  . Lack of Transportation (Non-Medical): Not on file  Physical Activity:   . Days of Exercise per Week: Not on file  . Minutes of Exercise per Session: Not on file  Stress:   . Feeling of Stress : Not on file  Social Connections:   . Frequency of Communication with Friends and Family: Not on file  . Frequency of Social Gatherings with Friends and Family: Not on file  . Attends Religious Services: Not on file  . Active Member of Clubs or Organizations: Not on file  . Attends Banker Meetings: Not on file  . Marital Status: Not on file  Intimate Partner Violence:   . Fear of Current or Ex-Partner: Not on file  . Emotionally Abused: Not on file  . Physically Abused: Not on file  . Sexually Abused: Not on file   Review of Systems Has spot on left knee for 2 months---antibiotic cream didn't help. No pain--just itching Sleeps okay ----uses CPAP nightly  Wears seat belt Teeth okay--recent filling No other skin lesions No heartburn on omeprazole---no dysphagia Bowels are fine. No blood Voids okay---still on testosterone Rx    Objective:   Physical Exam Constitutional:      Appearance: Normal appearance.  HENT:     Mouth/Throat:     Comments: No lesions Eyes:     Conjunctiva/sclera: Conjunctivae normal.     Pupils: Pupils are equal, round, and reactive to light.    Cardiovascular:     Rate and Rhythm: Normal rate and regular rhythm.     Pulses: Normal pulses.     Heart sounds: No murmur heard.  No gallop.   Pulmonary:     Effort: Pulmonary effort is normal.     Breath sounds: Normal breath sounds. No wheezing or rales.  Abdominal:     Palpations: Abdomen is soft.     Tenderness: There is no abdominal tenderness.  Musculoskeletal:     Cervical back: Neck supple.     Right lower leg: No edema.     Left lower leg: No edema.  Lymphadenopathy:     Cervical: No cervical adenopathy.  Skin:    General: Skin is warm.     Findings: No rash.     Comments: 3-68mm crusted non inflamed lesion on medial lower left knee (will try steroid and freeze if persists)  Neurological:     Mental Status: He is alert and oriented to person, place, and time.     Comments: President--- "Duane Boston, Trump, Obama" [phone] [id number] Recall 3/3  Psychiatric:        Mood and Affect: Mood normal.        Behavior: Behavior normal.            Assessment & Plan:

## 2020-05-01 DIAGNOSIS — R269 Unspecified abnormalities of gait and mobility: Secondary | ICD-10-CM | POA: Diagnosis not present

## 2020-05-01 DIAGNOSIS — G4733 Obstructive sleep apnea (adult) (pediatric): Secondary | ICD-10-CM | POA: Diagnosis not present

## 2020-05-14 ENCOUNTER — Other Ambulatory Visit: Payer: Self-pay | Admitting: Internal Medicine

## 2020-05-14 NOTE — Telephone Encounter (Signed)
Name of Medication:Hydrocodone Name of Pharmacy:[facility] Last Fill or Written Date and Quantity:04-17-20 #90 Last Office Visit and Type:04-24-20 Next Office Visit and Type:07-21-20 Last Controlled Substance Agreement Date:12-16-19 Last UDS:12-16-19

## 2020-05-27 ENCOUNTER — Telehealth: Payer: Self-pay

## 2020-05-27 NOTE — Telephone Encounter (Signed)
I spoke with pt; pt said every 30 seconds has a shock feeling in his lt foot. Pt never had before. No other symptoms; no weakness in lt side, no redness or swelling in foot or leg. I spoke with Dr Alphonsus Sias and he said that after covid booster can have various neurological symptoms.Dr Alphonsus Sias said does not sound like stroke and could be related to covid vaccine. Pt is to wait and watch to see if improvement in symptom and if not pt will go to UC for eval. FYI to Dr Alphonsus Sias.

## 2020-05-27 NOTE — Telephone Encounter (Signed)
[facility] Night - Client Nonclinical Telephone Record AccessNurse Client [facility] Night - Client Client Site [facility] - Night Physician Tillman Abide- MD Contact Type Call Who Is Calling Patient / Member / Family / Caregiver Caller Name Daquarius Dubeau Caller Phone Number [phone] Patient Name Dakota Ellison Patient DOB 1951/03/19 Call Type Message Only Information Provided Reason for Call Request to Schedule Office Appointment Initial Comment Caller states yesterday he got booster shot for COVID yesterday. His arm was a little sore. He went to bed at 115 and woke up at 415 his left foot has a tingling sensation in it. Every 30 seconds he has a feeling of getting shock. Additional Comment Triage declined. Disp. Time Disposition Final User 05/27/2020 7:53:24 AM General Information Provided Yes Josephina Shih Call Closed By: Josephina Shih Transaction Date/Time: 05/27/2020 7:49:36 AM (ET)

## 2020-05-27 NOTE — Telephone Encounter (Signed)
Spoke to pt. He said he went to work today and is doing a little better. Not having jolts. Did not go to UC. If he has trouble tonight, he will call tomorrow.

## 2020-05-27 NOTE — Telephone Encounter (Signed)
Please check on him 

## 2020-06-03 ENCOUNTER — Encounter: Payer: Self-pay | Admitting: Family Medicine

## 2020-06-03 ENCOUNTER — Other Ambulatory Visit: Payer: Self-pay | Admitting: Internal Medicine

## 2020-06-03 ENCOUNTER — Telehealth (INDEPENDENT_AMBULATORY_CARE_PROVIDER_SITE_OTHER): Payer: PPO | Admitting: Family Medicine

## 2020-06-03 VITALS — Ht 70.5 in | Wt 275.0 lb

## 2020-06-03 DIAGNOSIS — R059 Cough, unspecified: Secondary | ICD-10-CM | POA: Diagnosis not present

## 2020-06-03 DIAGNOSIS — J019 Acute sinusitis, unspecified: Secondary | ICD-10-CM

## 2020-06-03 MED ORDER — FLUTICASONE PROPIONATE 50 MCG/ACT NA SUSP
2.0000 | Freq: Two times a day (BID) | NASAL | 0 refills | Status: DC
Start: 2020-06-03 — End: 2023-02-13

## 2020-06-03 MED ORDER — BENZONATATE 200 MG PO CAPS: 200.0000 mg | ORAL_CAPSULE | Freq: Three times a day (TID) | ORAL | 0 refills | Status: DC

## 2020-06-03 MED ORDER — DOXYCYCLINE HYCLATE 100 MG PO TABS
100.0000 mg | ORAL_TABLET | Freq: Two times a day (BID) | ORAL | 0 refills | Status: DC
Start: 2020-06-03 — End: 2020-07-21

## 2020-06-03 NOTE — Progress Notes (Signed)
VIRTUAL VISIT VIA VIDEO  I connected with Chauncey Mann on 06/03/20 at  4:00 PM EST by elemedicine application and verified that I am speaking with the correct person using two identifiers. Location patient: Home Location provider: [facility], Office Persons participating in the virtual visit: Patient, Dr. Claiborne Billings and Paulina Fusi, CMA  I discussed the limitations of evaluation and management by telemedicine and the availability of in person appointments. The patient expressed understanding and agreed to proceed.   SUBJECTIVE Chief Complaint  Patient presents with  . Nasal Congestion    For 3 days- coughing up mucus. Taking cortisone bp an not helping   . Cough    HPI: SADIEL MOTA is a 69 y.o. male present for acute illness - onset 3 days ago pt reports productive cough started along with nasal congestion. He denies fever, chills, GI sx or shortness of breath. He endorses runny nose and sneezing started today. He has had no covid exposures and reports he had covid once. He does not feel this is consistent with prior covid sx. He has completed his vaccines series for covid and UTD w/ flu shot. He has a pmh of sinus infections and is concerned this will progress to a sinus infection by the weekend. He has tried coricidin without any resolution in symptoms .  ROS: See pertinent positives and negatives per HPI.  Patient Active Problem List   Diagnosis Date Noted  . Cyst of left kidney 12/16/2019  . Advance directive discussed with patient 03/27/2019  . Osteoarthritis of right knee 12/17/2018  . Chronic narcotic dependence (HCC) 10/09/2018  . Low back pain 04/11/2015  . Wellness examination 10/07/2014  . Mood disorder (HCC) 10/07/2014  . Impaired glucose tolerance 10/04/2013  . Inguinal hernia unilateral, non-recurrent, left 10/17/2012  . Bladder neck obstruction 10/04/2010  . Obstructive sleep apnea on CPAP 10/06/2008  . RECTAL FISSURE 05/14/2008  . Testosterone deficiency  10/02/2007  . Hyperlipidemia 07/13/2007  . Morbid obesity (HCC) 07/13/2007  . ERECTILE DYSFUNCTION 07/13/2007  . Essential hypertension 07/13/2007  . Lumbar spinal stenosis 07/13/2007  . Asthma 07/12/2007  . GERD 07/12/2007  . OA (osteoarthritis) of knee 07/12/2007    Social History   Tobacco Use  . Smoking status: Never Smoker  . Smokeless tobacco: Never Used  . Tobacco comment: CIGARS ONLY--QUIT 2013  Substance Use Topics  . Alcohol use: Yes    Alcohol/week: 0.0 standard drinks    Comment: rare//occassional    Current Outpatient Medications:  .  ALPRAZolam (XANAX) 0.5 MG tablet, TAKE 1 TABLET BY MOUTH THREE TIMES DAILYAS NEEDED, Disp: 90 tablet, Rfl: 0 .  amLODipine (NORVASC) 10 MG tablet, TAKE 1 TABLET BY MOUTH DAILY, Disp: 90 tablet, Rfl: 3 .  Apple Cider Vinegar 500 MG TABS, Take 500 mg by mouth daily. , Disp: , Rfl:  .  benzonatate (TESSALON) 200 MG capsule, Take 1 capsule (200 mg total) by mouth 3 (three) times daily., Disp: 30 capsule, Rfl: 0 .  cetirizine (ZYRTEC) 10 MG tablet, Take 10 mg by mouth daily., Disp: , Rfl:  .  CINNAMON PO, Take 200 mg by mouth daily., Disp: , Rfl:  .  doxycycline (VIBRA-TABS) 100 MG tablet, Take 1 tablet (100 mg total) by mouth 2 (two) times daily., Disp: 20 tablet, Rfl: 0 .  ezetimibe (ZETIA) 10 MG tablet, TAKE 1 TABLET BY MOUTH DAILY, Disp: 90 tablet, Rfl: 3 .  fluticasone (FLONASE) 50 MCG/ACT nasal spray, Place 2 sprays into both nostrils in  the morning and at bedtime., Disp: 16 g, Rfl: 0 .  Garlic 2000 MG CAPS, Take 2,000 mg by mouth 2 (two) times daily. , Disp: , Rfl:  .  Ginkgo Biloba 120 MG CAPS, Take 120 mg by mouth daily., Disp: , Rfl:  .  HYDROcodone-acetaminophen (NORCO/VICODIN) 5-325 MG tablet, TAKE 1 TABLET BY MOUTH THREE TIMES DAILYAS NEEDED FOR MODERATE PAIN, Disp: 90 tablet, Rfl: 0 .  loperamide (IMODIUM A-D) 2 MG tablet, Take 2 tablets (4 mg total) by mouth 4 (four) times daily as needed for diarrhea or loose stools., Disp: 30  tablet, Rfl: 0 .  Multiple Vitamin (MULTIVITAMIN WITH MINERALS) TABS tablet, Take 1 tablet by mouth daily., Disp: , Rfl:  .  niacin 500 MG CR capsule, Take 500 mg by mouth 2 (two) times daily., Disp: , Rfl:  .  omeprazole (PRILOSEC) 20 MG capsule, TAKE 1 CAPSULE BY MOUTH DAILY, Disp: 90 capsule, Rfl: 3 .  sertraline (ZOLOFT) 25 MG tablet, TAKE 1 TABLET BY MOUTH DAILY, Disp: 90 tablet, Rfl: 3 .  sildenafil (VIAGRA) 100 MG tablet, Take as directed (Patient taking differently: Take 100 mg by mouth daily as needed for erectile dysfunction. Take as directed), Disp: 10 tablet, Rfl: 5 .  sulfacetamide (BLEPH-10) 10 % ophthalmic solution, Place 2 drops into both eyes 4 (four) times daily., Disp: 15 mL, Rfl: 0 .  SYSTANE ULTRA 0.4-0.3 % SOLN, Place 1 drop into both eyes 4 (four) times daily as needed (dry/irritated eyes.)., Disp: , Rfl:  .  testosterone cypionate (DEPOTESTOSTERONE CYPIONATE) 200 MG/ML injection, Inject 200 mg into the muscle every 14 (fourteen) days. Saturdays, Disp: , Rfl:  .  triamcinolone (KENALOG) 0.1 %, Apply 1 application topically 2 (two) times daily as needed., Disp: 45 g, Rfl: 1 .  Turmeric (QC TUMERIC COMPLEX PO), Take 1 tablet by mouth daily., Disp: , Rfl:  .  valsartan-hydrochlorothiazide (DIOVAN-HCT) 320-25 MG tablet, TAKE 1 TABLET BY MOUTH DAILY, Disp: 90 tablet, Rfl: 3  Allergies  Allergen Reactions  . Atorvastatin Other (See Comments)    Muscle weakness in his legs  . Crestor [Rosuvastatin] Other (See Comments)    Severe muscle cramping  . Zocor [Simvastatin] Other (See Comments)    Muscle weakness in his legs    OBJECTIVE: Ht 5' 10.5" (1.791 m)   Wt 275 lb (124.7 kg)   BMI 38.90 kg/m  Gen: No acute distress. Nontoxic in appearance.  HENT: AT. [place].  MMM.  Eyes:Pupils Equal Round Reactive to light, Extraocular movements intact,  Conjunctiva without redness, discharge or icterus. Chest: Cough present, no shortness of breath Skin: no rashes, purpura or petechiae.   Neuro:  Alert. Oriented x3   ASSESSMENT AND PLAN: FARD BORUNDA is a 69 y.o. male present for  sinusitis/Cough Rest, hydrate.  Start flonase, mucinex (DM if cough), nettie pot or nasal saline.  Start tessalon perles for cough.  Discussed illness is likely viral since onset only 3 days ago- thus abx would not be helpful. Doxy BID  prescribed, take until completed IF started. Pt understands to not start abx unless symptoms continue to worsen over the next few days after start of OTC tx or if rebound infection occurs next week. Pt verbalized instructions back.  If cough present it can last up to 6-8 weeks.  F/U 2 weeks of not improved.     Felix Pacini, DO 06/03/2020   Return in about 1 week (around 06/10/2020), or if symptoms worsen or fail to improve.  No orders  of the defined types were placed in this encounter.  Meds ordered this encounter  Medications  . benzonatate (TESSALON) 200 MG capsule    Sig: Take 1 capsule (200 mg total) by mouth 3 (three) times daily.    Dispense:  30 capsule    Refill:  0  . fluticasone (FLONASE) 50 MCG/ACT nasal spray    Sig: Place 2 sprays into both nostrils in the morning and at bedtime.    Dispense:  16 g    Refill:  0  . doxycycline (VIBRA-TABS) 100 MG tablet    Sig: Take 1 tablet (100 mg total) by mouth 2 (two) times daily.    Dispense:  20 tablet    Refill:  0   Referral Orders  No referral(s) requested today

## 2020-06-04 NOTE — Telephone Encounter (Signed)
Spoke to pt. He said he was trying to get them at the same time. He is aware the hydrocodone is not due until 06-13-20.

## 2020-06-04 NOTE — Telephone Encounter (Signed)
Pt called in returning phone call 

## 2020-06-04 NOTE — Telephone Encounter (Signed)
Hydrocodone refill too soon. Last filled 05-14-20  Alprazolam last filled 03-15-20 #90 Last OV 04-24-20 Next OV 07-21-20 [facility]

## 2020-06-04 NOTE — Telephone Encounter (Signed)
Find out what happened with the hydrocodone

## 2020-06-04 NOTE — Telephone Encounter (Signed)
Tried to call pt. No answer. Went straight to VM. Left message and asked that he call before 5 to explain why he needed the refill almost 10 days early.

## 2020-06-10 ENCOUNTER — Other Ambulatory Visit: Payer: Self-pay | Admitting: Internal Medicine

## 2020-07-13 ENCOUNTER — Other Ambulatory Visit: Payer: Self-pay | Admitting: Internal Medicine

## 2020-07-13 NOTE — Telephone Encounter (Signed)
Name of Medication:Hydrocodone Name of Pharmacy:[facility] Last Fill or Written Date and Quantity:06-11-20 #90 Last Office Visit and Type:04-24-20 Next Office Visit and Type:07-21-20 Last Controlled Substance Agreement Date:12-16-19 Last UDS:12-16-19

## 2020-07-21 ENCOUNTER — Encounter: Payer: Self-pay | Admitting: Internal Medicine

## 2020-07-21 ENCOUNTER — Ambulatory Visit (INDEPENDENT_AMBULATORY_CARE_PROVIDER_SITE_OTHER): Payer: Medicare Other | Admitting: Internal Medicine

## 2020-07-21 ENCOUNTER — Other Ambulatory Visit: Payer: Self-pay

## 2020-07-21 DIAGNOSIS — M48062 Spinal stenosis, lumbar region with neurogenic claudication: Secondary | ICD-10-CM | POA: Diagnosis not present

## 2020-07-21 DIAGNOSIS — F1120 Opioid dependence, uncomplicated: Secondary | ICD-10-CM

## 2020-07-21 NOTE — Assessment & Plan Note (Signed)
Chronic pain  Continues with Dr Senaida Ores at [facility] for intermittent injections On the hydrocodone

## 2020-07-21 NOTE — Assessment & Plan Note (Signed)
PDMP reviewed No concerns 

## 2020-07-21 NOTE — Progress Notes (Signed)
Subjective:    Patient ID: Dakota Ellison, male    DOB: 1950/06/26, 70 y.o.   MRN: [id number]  HPI Here for follow up of chronic pain and narcotic dependence This visit occurred during the SARS-CoV-2 public health emergency.  Safety protocols were in place, including screening questions prior to the visit, additional usage of staff PPE, and extensive cleaning of exam room while observing appropriate contact time as indicated for disinfecting solutions.   Doing okay Ongoing pain in back Had been seeing Dr Senaida Ores at [facility]--but out of network --for back shots (and it helped) Now on [employer]---still seeing Senaida Ores.  Continues on the hydrocodone tid Has pain despite this--but wants to hold off on any increase Will rarely take 2 at a time--this clearly works better  Current Outpatient Medications on File Prior to Visit  Medication Sig Dispense Refill  . ALPRAZolam (XANAX) 0.5 MG tablet TAKE 1 TABLET BY MOUTH THREE TIMES DAILYAS NEEDED 90 tablet 0  . amLODipine (NORVASC) 10 MG tablet TAKE 1 TABLET BY MOUTH DAILY 90 tablet 3  . Apple Cider Vinegar 500 MG TABS Take 500 mg by mouth daily.     . cetirizine (ZYRTEC) 10 MG tablet Take 10 mg by mouth daily.    Marland Kitchen CINNAMON PO Take 200 mg by mouth daily.    Marland Kitchen ezetimibe (ZETIA) 10 MG tablet TAKE 1 TABLET BY MOUTH DAILY 90 tablet 3  . fluticasone (FLONASE) 50 MCG/ACT nasal spray Place 2 sprays into both nostrils in the morning and at bedtime. 16 g 0  . Garlic 2000 MG CAPS Take 2,000 mg by mouth 2 (two) times daily.     . Ginkgo Biloba 120 MG CAPS Take 120 mg by mouth daily.    Marland Kitchen HYDROcodone-acetaminophen (NORCO/VICODIN) 5-325 MG tablet TAKE 1 TABLET BY MOUTH THREE TIMES DAILYAS NEEDED FOR MODERATE PAIN 90 tablet 0  . loperamide (IMODIUM A-D) 2 MG tablet Take 2 tablets (4 mg total) by mouth 4 (four) times daily as needed for diarrhea or loose stools. 30 tablet 0  . Multiple Vitamin (MULTIVITAMIN WITH MINERALS) TABS tablet Take 1 tablet by mouth daily.     . niacin 500 MG CR capsule Take 500 mg by mouth 2 (two) times daily.    Marland Kitchen omeprazole (PRILOSEC) 20 MG capsule TAKE 1 CAPSULE BY MOUTH DAILY 90 capsule 3  . sertraline (ZOLOFT) 25 MG tablet TAKE 1 TABLET BY MOUTH DAILY 90 tablet 3  . sildenafil (VIAGRA) 100 MG tablet Take as directed (Patient taking differently: Take 100 mg by mouth daily as needed for erectile dysfunction. Take as directed) 10 tablet 5  . sulfacetamide (BLEPH-10) 10 % ophthalmic solution Place 2 drops into both eyes 4 (four) times daily. 15 mL 0  . SYSTANE ULTRA 0.4-0.3 % SOLN Place 1 drop into both eyes 4 (four) times daily as needed (dry/irritated eyes.).    Marland Kitchen testosterone cypionate (DEPOTESTOSTERONE CYPIONATE) 200 MG/ML injection Inject 200 mg into the muscle every 14 (fourteen) days. Saturdays    . triamcinolone (KENALOG) 0.1 % Apply 1 application topically 2 (two) times daily as needed. 45 g 1  . Turmeric (QC TUMERIC COMPLEX PO) Take 1 tablet by mouth daily.    . valsartan-hydrochlorothiazide (DIOVAN-HCT) 320-25 MG tablet TAKE 1 TABLET BY MOUTH DAILY 90 tablet 3   No current facility-administered medications on file prior to visit.    Allergies  Allergen Reactions  . Atorvastatin Other (See Comments)    Muscle weakness in his legs  . Crestor [Rosuvastatin]  Other (See Comments)    Severe muscle cramping  . Zocor [Simvastatin] Other (See Comments)    Muscle weakness in his legs    Past Medical History:  Diagnosis Date  . Anxiety state 04/08/2014   denies  . Complication of anesthesia    and trouble waking up  . Degenerative disc disease   . Dysthymia    denies  . ED (erectile dysfunction)   . GERD (gastroesophageal reflux disease)   . History of MRSA infection    on lip from cut  . Hyperlipemia   . Hypertension    take BP med for small leak in heart to keep it small per pt.  . Mild asthma    as a teen  . Morbid obesity (HCC)   . OSA (obstructive sleep apnea)   . Osteoarthritis   . PONV (postoperative  nausea and vomiting)   . Rectal fissure   . Testosterone deficiency     Past Surgical History:  Procedure Laterality Date  . HERNIA REPAIR     1998  . KNEE SURGERY  09/2010   left knee and right knee arthroscopy  . OTHER SURGICAL HISTORY     arthroscopic subacromial decompression  . OTHER SURGICAL HISTORY     open resection, distal right clavicle  . SHOULDER ARTHROSCOPY     right  . SPHINCTEROTOMY    . TOTAL KNEE ARTHROPLASTY Left 06/11/2018   Procedure: LEFT TOTAL KNEE ARTHROPLASTY;  Surgeon: [name], MD;  Location: [facility];  Service: Orthopedics;  Laterality: Left;   . TOTAL KNEE ARTHROPLASTY Right 12/17/2018   Procedure: TOTAL KNEE ARTHROPLASTY;  Surgeon: [name], MD;  Location: [facility];  Service: Orthopedics;  Laterality: Right;     Family History  Problem Relation Age of Onset  . Pneumonia Brother     Social History   Socioeconomic History  . Marital status: Single    Spouse name: Not on file  . Number of children: 1  . Years of education: Not on file  . Highest education level: Not on file  Occupational History  . Occupation: long Estate agent    Comment: Retired  . 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 Smoking status: Never Smoker  . Smokeless tobacco: Never Used  . Tobacco comment: CIGARS ONLY--QUIT 2013  Vaping Use  . Vaping Use: Never used  Substance and Sexual Activity  . Alcohol use: Yes    Alcohol/week: 0.0 standard drinks    Comment: rare//occassional  . Drug use: No  . Sexual activity: Yes  Other Topics Concern  . Not on file  Social History Narrative   Has living will   Son is health care POA   Would accept resuscitation attempts   Likely wouldn't want extended tube feeds if cognitively unaware   Social Determinants of Health   Financial Resource Strain: Not on file  Food Insecurity: Not on file  Transportation Needs: Not on file  Physical Activity: Not on  file  Stress: Not on file  Social Connections: Not on file  Intimate Partner Violence: Not on file   Review of Systems Appetite is good Sleeps fairly well--uses the CPAP nightly     Objective:   Physical Exam Constitutional:      Appearance: Normal appearance.  Neurological:     Mental Status: He is alert.  Psychiatric:        Mood and Affect: Mood normal.  Behavior: Behavior normal.            Assessment & Plan:

## 2020-07-27 ENCOUNTER — Ambulatory Visit: Payer: PPO | Admitting: Internal Medicine

## 2020-08-04 DIAGNOSIS — G8929 Other chronic pain: Secondary | ICD-10-CM | POA: Diagnosis not present

## 2020-08-04 DIAGNOSIS — M25572 Pain in left ankle and joints of left foot: Secondary | ICD-10-CM | POA: Diagnosis not present

## 2020-08-04 DIAGNOSIS — M19072 Primary osteoarthritis, left ankle and foot: Secondary | ICD-10-CM | POA: Diagnosis not present

## 2020-08-04 DIAGNOSIS — M79672 Pain in left foot: Secondary | ICD-10-CM | POA: Diagnosis not present

## 2020-08-04 DIAGNOSIS — M7662 Achilles tendinitis, left leg: Secondary | ICD-10-CM | POA: Diagnosis not present

## 2020-08-20 ENCOUNTER — Other Ambulatory Visit: Payer: Self-pay | Admitting: Internal Medicine

## 2020-08-21 NOTE — Telephone Encounter (Signed)
Name of Medication:Hydrocodone Name of Pharmacy:[facility] Last Fill or Written Date and Quantity:07-13-20 #90 Last Office Visit and Type:07-21-20 Next Office Visit and Type:10-13-20 Last Controlled Substance Agreement Date:12-16-19 Last UDS:12-16-19

## 2020-09-01 ENCOUNTER — Other Ambulatory Visit: Payer: Self-pay | Admitting: Internal Medicine

## 2020-09-02 NOTE — Telephone Encounter (Signed)
Last filled 06-04-20 #90 Last OV 07-21-20 Next OV 10-13-20 [facility]

## 2020-09-17 ENCOUNTER — Ambulatory Visit: Payer: Medicare Other | Admitting: Internal Medicine

## 2020-09-21 ENCOUNTER — Other Ambulatory Visit: Payer: Self-pay | Admitting: Internal Medicine

## 2020-09-21 NOTE — Telephone Encounter (Signed)
Name of Medication:Hydrocodone Name of Pharmacy:[facility] Last Fill or Written Date and Quantity:08-21-20#90 Last Office Visit and Type:07-21-20 Next Office Visit and Type:10-13-20 Last Controlled Substance Agreement Date:12-16-19 Last UDS:12-16-19

## 2020-10-06 NOTE — Progress Notes (Signed)
Subjective:    Patient ID: Dakota Ellison, male    DOB: 03/01/1951, 70 y.o.   MRN: [id number]  HPI  male Never smoker followed for OSA complicated by morbid obesity, HBP, asthma, GERD NPSG 2010, AHI 82/ hr CPAP to 20  ------------------------------------------------------------------------------------   07/29/19- 70 year old male never smoker followed for OSA, complicated by morbid obesity, HBP, asthma, GERD, ( works transporting inmates) CPAP auto 10-20/ [facility] Download compliance 100%, AHI 8/ hr ------f/u OSA . Breathing is at his baseline.  Had Covid infection in August. Body weight today 281 lbs ------f/u OSA . Breathing is at his baseline.  CPAP doing well. Now several days of sore throat, green phlegm, no fever. CXR 1V 01/27/2019-  No evidence of acute cardiopulmonary disease.  10/07/20- 69 year old male never smoker followed for OSA, complicated by morbid obesity, HBP, asthma, GERD, ( works transporting inmates), Low Back Pain,  -alprazolam 0.5 tid prn,  CPAP auto 10-20/ [facility]     Resmed AirSense 10 AutoSet  Download-compliance 100%, AHI 12.5/ hr   Pressure limiting at 19.9cm max Body weight today- 286 lbs Covid vax- -----Wears CPAP-doing good He sleeps alone, but says he feels comfortable with CPAP and well-rested. We reviewed download and decided we would recheck on folow-up, but he will probably need BIPAP titration. Weight loss recommended.   ROS-see HPI   + = positive Constitutional:    weight loss, night sweats, fevers, chills, fatigue, lassitude. HEENT:    headaches, difficulty swallowing, tooth/dental problems, sore throat,       sneezing, itching, ear ache, nasal congestion, post nasal drip, snoring CV:    chest pain, orthopnea, PND, swelling in lower extremities, anasarca,                                  dizziness, palpitations Resp:   shortness of breath with exertion or at rest.                productive cough,   non-productive cough, coughing up of blood.               change in color of mucus.  wheezing.   Skin:    rash or lesions. GI:  No-   heartburn, indigestion, abdominal pain, nausea, vomiting, diarrhea,                 change in bowel habits, loss of appetite GU: dysuria, change in color of urine, no urgency or frequency.   flank pain. MS:   joint pain, stiffness, decreased range of motion, back pain. Neuro-     nothing unusual Psych:  change in mood or affect.  depression or anxiety.   memory loss.    Objective:   OBJ- Physical Exam       General- Alert, Oriented, Affect-appropriate, Distress- none acute, + obese/big man Skin- rash-none, lesions- none, excoriation- none Lymphadenopathy- none Head- atraumatic            Eyes- Gross vision intact, PERRLA, conjunctivae and secretions clear            Ears- Hearing, canals-normal            Nose- Clear, no-Septal dev, mucus, polyps, erosion, perforation             Throat- Mallampati III , mucosa clear , drainage- none, tonsils- atrophic Neck- flexible , trachea midline, no stridor , thyroid nl, carotid no bruit Chest - symmetrical excursion ,  unlabored           Heart/CV- RRR/ occ skip , no murmur , no gallop  , no rub, nl s1 s2                           - JVD- none , edema- none, stasis changes- none, varices- none           Lung- clear to P&A, wheeze- none, cough- none , dullness-none, rub- none           Chest wall-  Abd-  Br/ Gen/ Rectal- Not done, not indicated Extrem- cyanosis- none, clubbing, none, atrophy- none, strength- nl, + healing TKR incision Neuro- grossly intact to observation    Assessment & Plan:

## 2020-10-07 ENCOUNTER — Ambulatory Visit: Payer: Medicare Other | Admitting: Internal Medicine

## 2020-10-07 ENCOUNTER — Encounter: Payer: Self-pay | Admitting: Internal Medicine

## 2020-10-07 ENCOUNTER — Other Ambulatory Visit: Payer: Self-pay

## 2020-10-07 DIAGNOSIS — Z9989 Dependence on other enabling machines and devices: Secondary | ICD-10-CM

## 2020-10-07 DIAGNOSIS — G4733 Obstructive sleep apnea (adult) (pediatric): Secondary | ICD-10-CM

## 2020-10-07 NOTE — Assessment & Plan Note (Signed)
Benefits from CPAP. Good compliance.  Plan- recheck download at next visit. If breakthrough AHi remains high, consider BIPAP titration sleep study for a machine that can give higher pressures. Encourage weight loss.

## 2020-10-07 NOTE — Patient Instructions (Signed)
We can continue CPAP 10-20. Try to keep your weight down some. That will help keep down your pressure requirement. Next time we will look at your scores and consider retesting.   Please call if we can help

## 2020-10-07 NOTE — Assessment & Plan Note (Signed)
Value to him of gradual weight loss was emphasized.

## 2020-10-13 ENCOUNTER — Encounter: Payer: Self-pay | Admitting: Internal Medicine

## 2020-10-13 ENCOUNTER — Other Ambulatory Visit: Payer: Self-pay

## 2020-10-13 ENCOUNTER — Ambulatory Visit (INDEPENDENT_AMBULATORY_CARE_PROVIDER_SITE_OTHER): Payer: Medicare Other | Admitting: Internal Medicine

## 2020-10-13 VITALS — BP 110/70 | HR 80 | Temp 97.8°F | Ht 73.0 in | Wt 286.0 lb

## 2020-10-13 DIAGNOSIS — F1120 Opioid dependence, uncomplicated: Secondary | ICD-10-CM | POA: Diagnosis not present

## 2020-10-13 DIAGNOSIS — F39 Unspecified mood [affective] disorder: Secondary | ICD-10-CM | POA: Diagnosis not present

## 2020-10-13 DIAGNOSIS — M48061 Spinal stenosis, lumbar region without neurogenic claudication: Secondary | ICD-10-CM | POA: Diagnosis not present

## 2020-10-13 NOTE — Progress Notes (Signed)
Subjective:    Patient ID: Dakota Ellison, male    DOB: Oct 06, 1950, 70 y.o.   MRN: [id number]  HPI Here for follow up of chronic pain and mood issues This visit occurred during the SARS-CoV-2 public health emergency.  Safety protocols were in place, including screening questions prior to the visit, additional usage of staff PPE, and extensive cleaning of exam room while observing appropriate contact time as indicated for disinfecting solutions.   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 ALPRAZolam (XANAX) 0.5 MG tablet TAKE 1 TABLET BY MOUTH THREE TIMES DAILYAS NEEDED 90 tablet 0  . amLODipine (NORVASC) 10 MG tablet TAKE 1 TABLET BY MOUTH DAILY 90 tablet 3  . Apple Cider Vinegar 500 MG TABS Take 500 mg by mouth daily.     . cetirizine (ZYRTEC) 10 MG tablet Take 10 mg by mouth daily.    Marland Kitchen CINNAMON PO Take 200 mg by mouth daily.    Marland Kitchen ezetimibe (ZETIA) 10 MG tablet TAKE 1 TABLET BY MOUTH DAILY 90 tablet 3  . fluticasone (FLONASE) 50 MCG/ACT nasal spray Place 2 sprays into both nostrils in the morning and at bedtime. 16 g 0  . Garlic 2000 MG CAPS Take 2,000 mg by mouth 2 (two) times daily.     . Ginkgo Biloba 120 MG CAPS Take 120 mg by mouth daily.    Marland Kitchen HYDROcodone-acetaminophen (NORCO/VICODIN) 5-325 MG tablet TAKE 1 TABLET BY MOUTH THREE TIMES DAILYAS NEEDED FOR MODERATE PAIN 90 tablet 0  . loperamide (IMODIUM A-D) 2 MG tablet Take 2 tablets (4 mg total) by mouth 4 (four) times daily as needed for diarrhea or loose stools. 30 tablet  0  . Multiple Vitamin (MULTIVITAMIN WITH MINERALS) TABS tablet Take 1 tablet by mouth daily.    . niacin 500 MG CR capsule Take 500 mg by mouth 2 (two) times daily.    Marland Kitchen omeprazole (PRILOSEC) 20 MG capsule TAKE 1 CAPSULE BY MOUTH DAILY 90 capsule 3  . sertraline (ZOLOFT) 25 MG tablet TAKE 1 TABLET BY MOUTH DAILY 90 tablet 3  . sildenafil (VIAGRA) 100 MG tablet Take as directed (Patient taking differently: Take 100 mg by mouth daily as needed for erectile dysfunction. Take as directed) 10 tablet 5  . SYSTANE ULTRA 0.4-0.3 % SOLN Place 1 drop into both eyes 4 (four) times daily as needed (dry/irritated eyes.).    Marland Kitchen testosterone cypionate (DEPOTESTOSTERONE CYPIONATE) 200 MG/ML injection Inject 200 mg into the muscle every 14 (fourteen) days. Saturdays    . Turmeric (QC TUMERIC COMPLEX PO) Take 1 tablet by mouth daily.    . valsartan-hydrochlorothiazide (DIOVAN-HCT) 320-25 MG tablet TAKE 1 TABLET BY MOUTH DAILY 90 tablet 3  . triamcinolone (KENALOG) 0.1 % Apply 1 application topically 2 (two) times daily as needed. (Patient not taking: Reported on 10/13/2020) 45 g 1   No current facility-administered medications on file prior to visit.    Allergies  Allergen Reactions  . Atorvastatin Other (See Comments)    Muscle weakness in his legs  .  Crestor [Rosuvastatin] Other (See Comments)    Severe muscle cramping  . Zocor [Simvastatin] Other (See Comments)    Muscle weakness in his legs    Past Medical History:  Diagnosis Date  . Anxiety state 04/08/2014   denies  . Complication of anesthesia    and trouble waking up  . Degenerative disc disease   . Dysthymia    denies  . ED (erectile dysfunction)   . GERD (gastroesophageal reflux disease)   . History of MRSA infection    on lip from cut  . Hyperlipemia   . Hypertension    take BP med for small leak in heart to keep it small per pt.  . Mild asthma    as a teen  . Morbid obesity (HCC)   . OSA (obstructive sleep apnea)   .  Osteoarthritis   . PONV (postoperative nausea and vomiting)   . Rectal fissure   . Testosterone deficiency     Past Surgical History:  Procedure Laterality Date  . HERNIA REPAIR     1998  . KNEE SURGERY  09/2010   left knee and right knee arthroscopy  . OTHER SURGICAL HISTORY     arthroscopic subacromial decompression  . OTHER SURGICAL HISTORY     open resection, distal right clavicle  . SHOULDER ARTHROSCOPY     right  . SPHINCTEROTOMY    . TOTAL KNEE ARTHROPLASTY Left 06/11/2018   Procedure: LEFT TOTAL KNEE ARTHROPLASTY;  Surgeon: [name], MD;  Location: [facility];  Service: Orthopedics;  Laterality: Left;   . TOTAL KNEE ARTHROPLASTY Right 12/17/2018   Procedure: TOTAL KNEE ARTHROPLASTY;  Surgeon: [name], MD;  Location: [facility];  Service: Orthopedics;  Laterality: Right;     Family History  Problem Relation Age of Onset  . Pneumonia Brother     Social History   Socioeconomic History  . Marital status: Single    Spouse name: Not on file  . Number of children: 1  . Years of education: Not on file  . Highest education level: Not on file  Occupational History  . Occupation: long Estate agent    Comment: Retired  . 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 Smoking status: Never Smoker  . Smokeless tobacco: Never Used  . Tobacco comment: CIGARS ONLY--QUIT 2013  Vaping Use  . Vaping Use: Never used  Substance and Sexual Activity  . Alcohol use: Yes    Alcohol/week: 0.0 standard drinks    Comment: rare//occassional  . Drug use: No  . Sexual activity: Yes  Other Topics Concern  . Not on file  Social History Narrative   Has living will   Son is health care POA   Would accept resuscitation attempts   Likely wouldn't want extended tube feeds if cognitively unaware   Social Determinants of Health   Financial Resource Strain: Not on file  Food Insecurity: Not on file  Transportation Needs:  Not on file  Physical Activity: Not on file  Stress: Not on file  Social Connections: Not on file  Intimate Partner Violence: Not on file   Review of Systems  Sleeps okay Continues using the CPAP nightly Appetite is okay Weight stable     Objective:   Physical Exam Neurological:     Mental Status: He is alert.  Psychiatric:        Mood and Affect: Mood normal.  Behavior: Behavior normal.            Assessment & Plan:

## 2020-10-13 NOTE — Assessment & Plan Note (Signed)
PDMP reviewed No concerns 

## 2020-10-13 NOTE — Assessment & Plan Note (Signed)
Some dysthymia and anxiety Doing well now but chronic pain, etc Discussed considering skipping doses---like one day a week for a month, then 2 days a week, etc

## 2020-10-13 NOTE — Patient Instructions (Signed)
If you would like to try weaning off the sertraline, you can skip a day every week for a month, then try skipping 2 days a week and so on. You can continue it if you notice any problems with this.

## 2020-10-13 NOTE — Assessment & Plan Note (Signed)
With chronic pain May be trying another epidural injection Continues on the hydrocodone three times a day

## 2020-10-17 ENCOUNTER — Other Ambulatory Visit: Payer: Self-pay | Admitting: Family

## 2020-10-26 DIAGNOSIS — M48061 Spinal stenosis, lumbar region without neurogenic claudication: Secondary | ICD-10-CM | POA: Diagnosis not present

## 2020-10-26 DIAGNOSIS — M4310 Spondylolisthesis, site unspecified: Secondary | ICD-10-CM | POA: Diagnosis not present

## 2020-10-26 DIAGNOSIS — M545 Low back pain, unspecified: Secondary | ICD-10-CM | POA: Diagnosis not present

## 2020-10-26 NOTE — Telephone Encounter (Signed)
[facility] Night - Client TELEPHONE ADVICE RECORD [facility] Patient Name: Dakota Ellison Gender: Male DOB: 03-30-1951 Age: 70 Y 1 M 7 D Return Phone Number: [phone] (Primary) Address: City/ State/ Zip: [place] [address] Client [facility] Night - Client Client Site [facility] - Night Physician Dakota Ellison- MD Contact Type Call Who Is Calling Patient / Member / Family / Caregiver Call Type Triage / Clinical Relationship To Patient Self Return Phone Number [phone] (Primary) Chief Complaint Pain - Generalized Reason for Call Symptomatic / Request for Health Information Initial Comment Caller states his pharmacy has sent a prescription request three times this week, and they have not heard back. He checked with the pharmacy again today and it is still not filled. He is in pain, and he needs his pain medication. The medication is hydrocodone 5-325mg . Translation No No Triage Reason Patient declined Nurse Assessment Nurse: Dakota Pont, RN, Dakota Ellison Date/Time Dakota Ellison Time): 10/24/2020 11:36:47 AM Confirm and document reason for call. If symptomatic, describe symptoms. ---Caller states his pharmacy has sent a prescription request three times this week, and they have not heard back. He checked with the pharmacy again today and it is still not filled. He is in pain, and he needs his pain medication. The medication is hydrocodone 5-325mg . States called dr office yesterday and spoke with staff and staff states she would send request back to dr and was informed would have medication this afternoon. states pharmacy is still saying they have not heard from dr. Andrey Ellison takes his medication for back pain. Does the patient have any new or worsening symptoms? ---Yes Will a triage be completed? ---No Select reason for no triage. ---Patient declined Please document clinical information provided and list any resource  used. ---Informed pt unable to contact on call after hours for controlled substance. Instructed pt can go to UC/ED. Pt states if pain gets bad he will consider that. Also informed pt to watch for any withdrawal symptoms like n/v, dizziness, etc. Informed pt a note will be faxed to office and they will see it on monday but to call at 8am when office opens on monday. Nurse: Dakota Pont, RN, Dakota Ellison Date/Time (Eastern Time): 10/24/2020 11:42:10 AM PLEASE NOTE: All timestamps contained within this report are represented as [place] Standard Time. CONFIDENTIALTY NOTICE: This fax transmission is intended only for the addressee. It contains information that is legally privileged, confidential or otherwise protected from use or disclosure. If you are not the intended recipient, you are strictly prohibited from reviewing, disclosing, copying using or disseminating any of this information or taking any action in reliance on or regarding this information. If you have received this fax in error, please notify us immediately by telephone so that we can arrange for its return to [place]. Phone: [phone], Toll-Free: [phone], Fax: [phone] Page: 2 of 2 Call Id: [id number] Nurse Assessment Please select the assessment type ---Request for controlled medication refill Additional Documentation ---Pt states he needs a refill on his hydrocodone 5/325mg  . States he takes medication three times a day and is now completely out. States between the pharmacy and himself, they have been in contact with dr's office to get medication refilled with no success. Pt states he even spoke with someone in the office yesterday and was informed medication would be there that afternoon. Pharmacy today is still saying they have not heard from dr. Informed pt unable to contact on call after hours for controlled substances but informed pt can  go to ED/UC. Pt states will consider if his symptoms worsen. Instructed pt to watch  for withdrawal symptoms as well. Is there an on-call physician for the client? ---Yes Do the client directives specifically allow for paging the on-call regarding scheduled drugs? ---No Disp. Time Dakota Ellison Time) Disposition Final User 10/24/2020 11:44:22 AM Clinical Call Yes Dakota Pont, RN, Dakota Ellison

## 2020-10-26 NOTE — Telephone Encounter (Signed)
I spoke with Joni Reining at [facility] and Hydrocodone apap is ready for pick up. Per DPR left v/m for pt that rx was ready for pick up.

## 2020-10-28 DIAGNOSIS — G4733 Obstructive sleep apnea (adult) (pediatric): Secondary | ICD-10-CM | POA: Diagnosis not present

## 2020-10-29 IMAGING — CT CT HEART SCORING
2 series · 16 of 20 positions shown, 18 images · non-contrast
Comparison: None.

CLINICAL DATA: Risk stratification

EXAM:
Coronary Calcium Score
TECHNIQUE: The patient was scanned on a Siemens Force scanner. Axial
non-contrast 3 mm slices were carried out through the heart. The
data set was analyzed on a dedicated work station and scored using
the Agatson method.

[Series 2: casc 3.0 i36f 2 bestdiast 70 % · axial · 0.44mm/px · z∈[-269,-155]mm · 8 of 50 slices shown, 10 images]
[im 6/50  vessel]
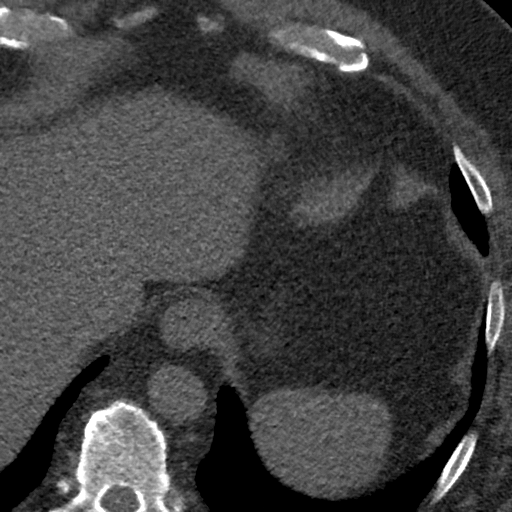
[im 6/50  lung]
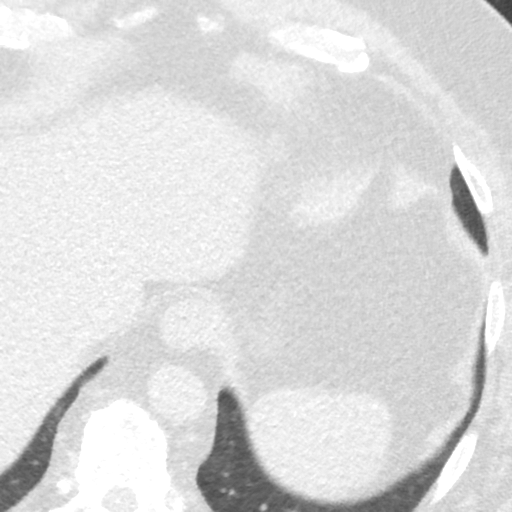
[im 11/50  vessel]
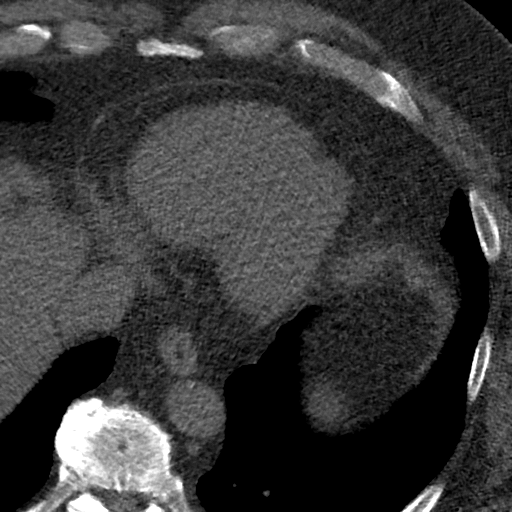
[im 17/50  vessel]
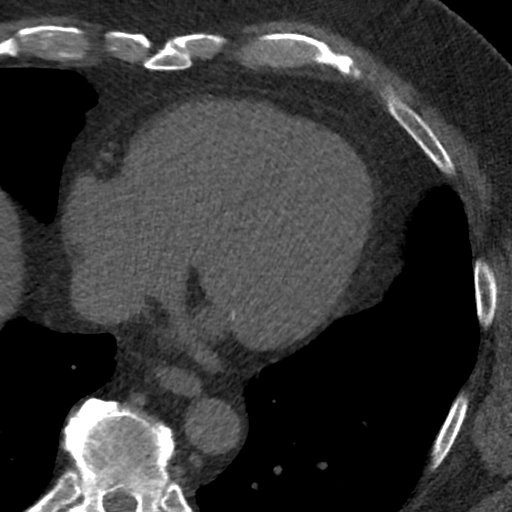
[im 22/50  vessel]
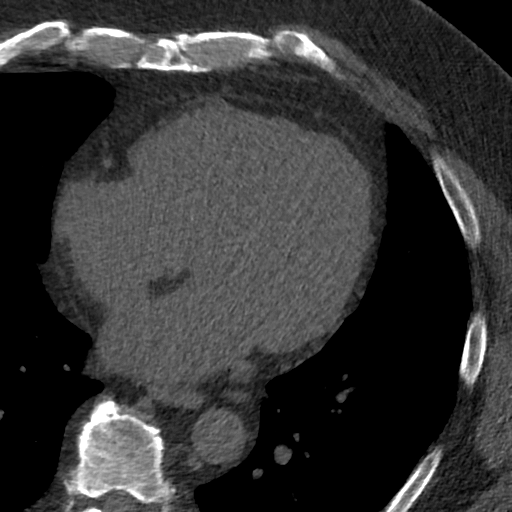
[im 28/50  vessel]
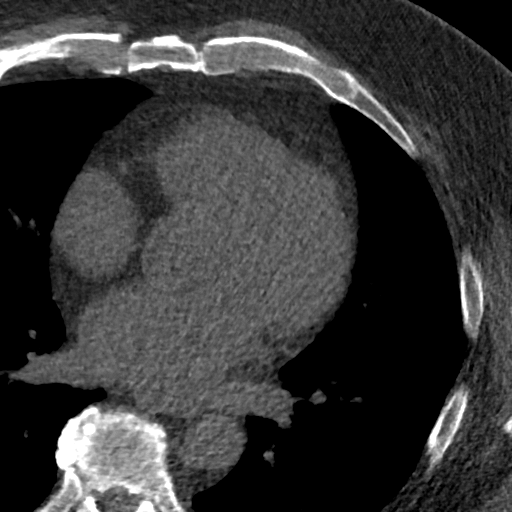
[im 28/50  lung]
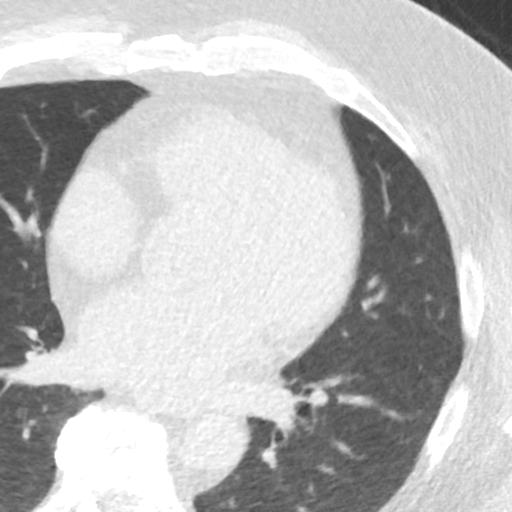
[im 33/50  vessel]
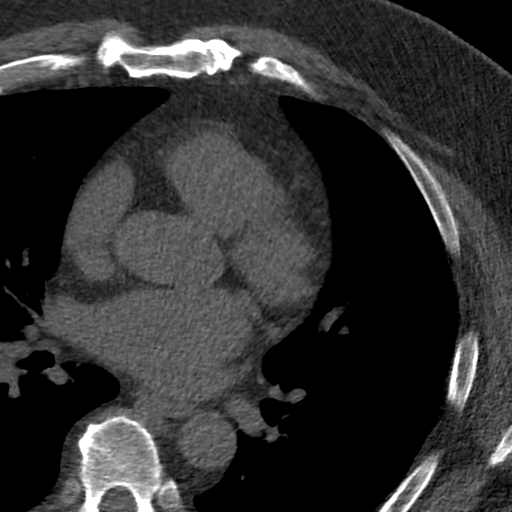
[im 39/50  vessel]
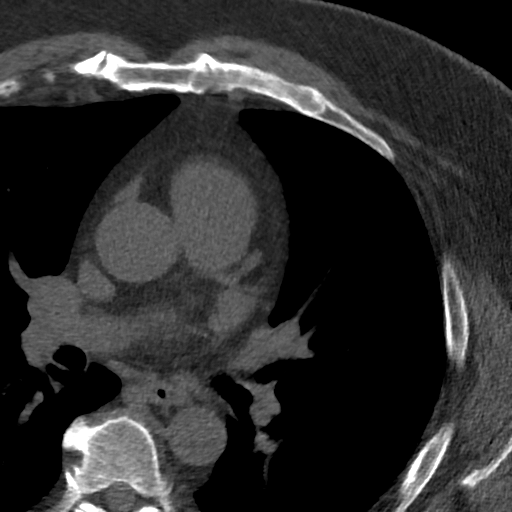
[im 44/50  vessel]
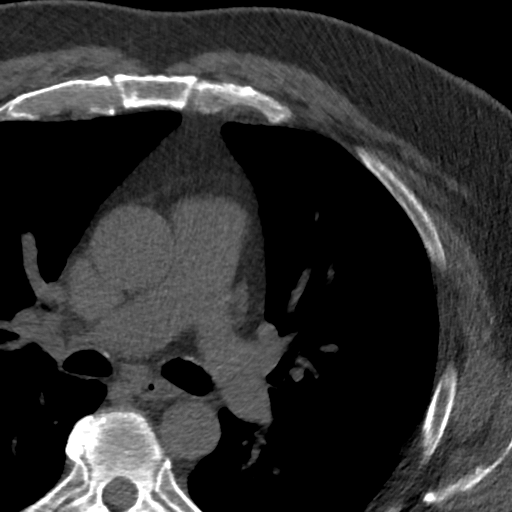

[Series 4: lung st 70 % · axial · 0.79mm/px · z∈[-269,-155]mm · 8 of 50 slices shown]
[im 6/50  lung]
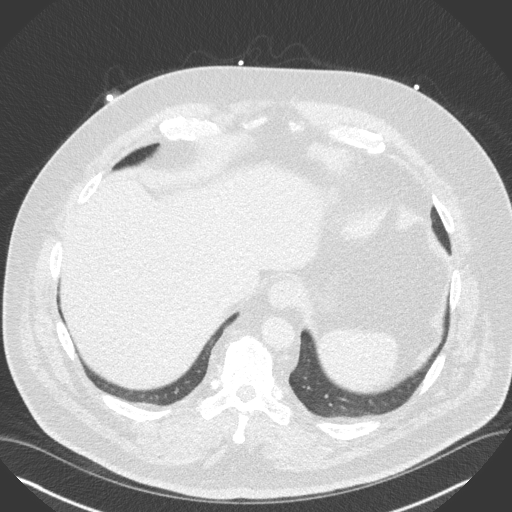
[im 11/50  lung]
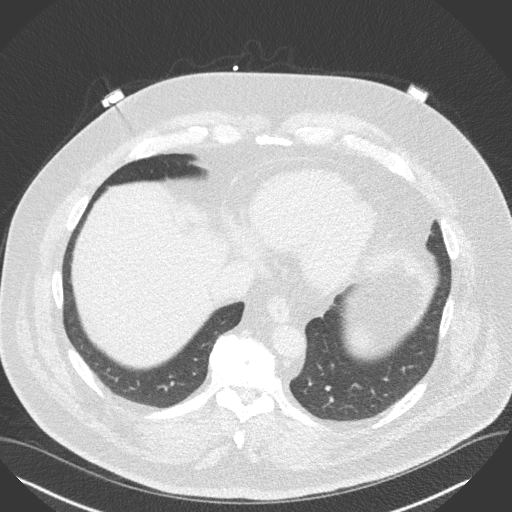
[im 17/50  lung]
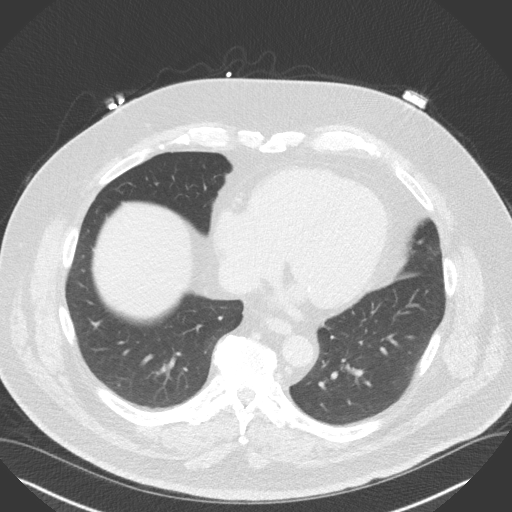
[im 22/50  lung]
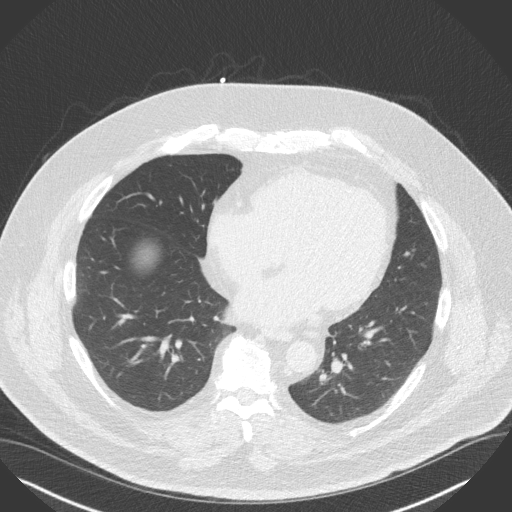
[im 28/50  lung]
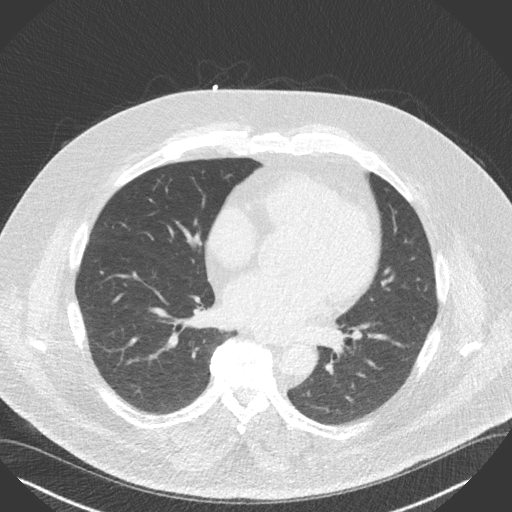
[im 33/50  lung]
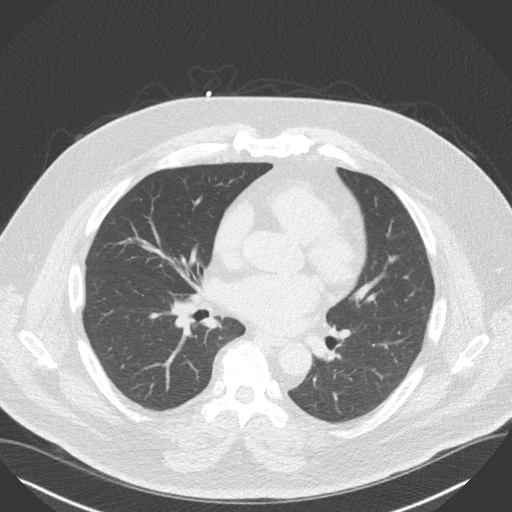
[im 39/50  lung]
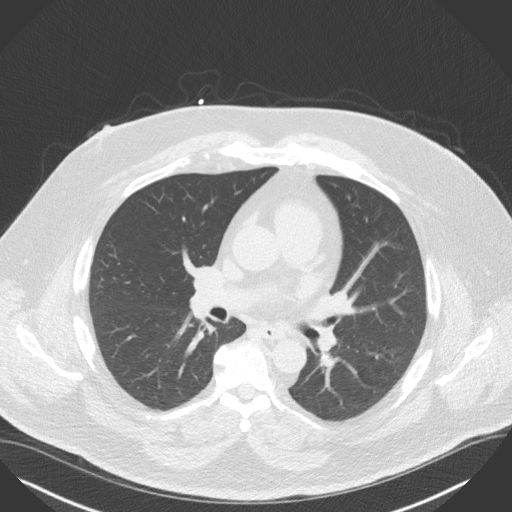
[im 44/50  lung]
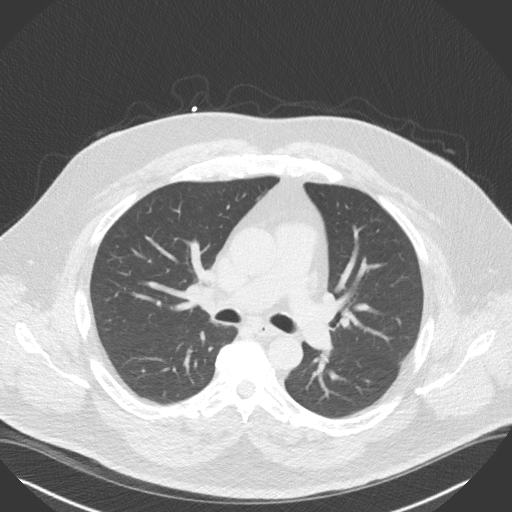

[16 of 20 positions shown; findings below may reference images not displayed]

FINDINGS: Non-cardiac: See separate report from [REDACTED].

Ascending Aorta: Normal size, no calcifications.

Pericardium: Normal.

Coronary arteries: Normal origin.
IMPRESSION: Coronary calcium score of 0. This was 0 percentile for age and sex
matched control.

EXAM:
OVER-READ INTERPRETATION  CT CHEST

The following report is an over-read performed by radiologist Dr.
Golale Sahra [REDACTED] on 04/04/2018. This
over-read does not include interpretation of cardiac or coronary
anatomy or pathology. The coronary CTA ********* sec coronary
calcium score interpretation by the cardiologist is attached.
FINDINGS: Vascular: Heart is normal size.  Visualized aorta is normal caliber.

Mediastinum/Nodes: No adenopathy in the lower mediastinum or hila.

Lungs/Pleura: Visualize lungs clear.  No effusions.

Upper Abdomen: Imaging into the upper abdomen shows no acute
findings.

Musculoskeletal: Chest wall soft tissues are unremarkable. No acute
bony abnormality.
IMPRESSION: No acute or significant extracardiac abnormality.

## 2020-11-17 ENCOUNTER — Telehealth: Payer: Self-pay

## 2020-11-17 NOTE — Telephone Encounter (Signed)
At work on 11/16/20 pt was in transport [place] with his work partner without a mask who did not feel well. his work partner tested + for covid at [facility] on 11/16/20. Pt said today the pt does not have any symptoms but wonders if there is anything pt can do to prevent pt from getting covid. I reviewed covid symptoms with pt and he does not have covid symptoms. Pt said he plans on staying home for a few days and will wear a mask if around others. Pt will stay hydrated and rest. Pt had covid immunizations on 07/13/19, 08/05/2019 and booster on 05/10/20. UC & ED precautions given and pt voiced understanding. Pt will cb if develops any covid symptoms. Pt request cb after Dr Alphonsus Sias reviews note to see if any suggestions of what pt should do.[facility].

## 2020-11-18 NOTE — Telephone Encounter (Signed)
Spoke to pt. He said he is doing good so far. Appreciated the information.

## 2020-11-18 NOTE — Telephone Encounter (Signed)
There is no cuirrent post exposure prophylaxis recommendations (no meds indicated). He will just have to wait. With the BA-2---if 2-3 days go by without symptoms, he is unlikely to have anything come up

## 2020-11-20 DIAGNOSIS — G4733 Obstructive sleep apnea (adult) (pediatric): Secondary | ICD-10-CM | POA: Diagnosis not present

## 2020-11-25 ENCOUNTER — Other Ambulatory Visit: Payer: Self-pay | Admitting: Internal Medicine

## 2020-11-25 NOTE — Telephone Encounter (Signed)
Name of Medication: Hydrocodone Name of Pharmacy: [facility] Last Fill or Written Date and Quantity: 10-26-20 #90 Last Office Visit and Type: 10-13-20 Next Office Visit and Type: 01-04-21 Last Controlled Substance Agreement Date: 12-16-19 Last UDS: 12-16-19

## 2020-11-30 ENCOUNTER — Other Ambulatory Visit: Payer: Self-pay | Admitting: Internal Medicine

## 2020-11-30 NOTE — Telephone Encounter (Signed)
Last filled 09-02-20 #90 Last OV 10-13-20 Next OV 01-04-21 [facility]

## 2020-12-23 ENCOUNTER — Other Ambulatory Visit: Payer: Self-pay | Admitting: Internal Medicine

## 2020-12-24 ENCOUNTER — Other Ambulatory Visit: Payer: Self-pay | Admitting: Internal Medicine

## 2020-12-25 NOTE — Telephone Encounter (Signed)
Name of Medication: Hydrocodone Name of Pharmacy: [facility] Last Fill or Written Date and Quantity: 11-25-20 #90 Last Office Visit and Type: 10-13-20 Next Office Visit and Type: 01-04-21 Last Controlled Substance Agreement Date: 12-16-19 Last UDS: 12-16-19

## 2021-01-04 ENCOUNTER — Ambulatory Visit (INDEPENDENT_AMBULATORY_CARE_PROVIDER_SITE_OTHER): Payer: Medicare Other | Admitting: Internal Medicine

## 2021-01-04 ENCOUNTER — Encounter: Payer: Self-pay | Admitting: Internal Medicine

## 2021-01-04 ENCOUNTER — Other Ambulatory Visit: Payer: Self-pay

## 2021-01-04 VITALS — BP 110/70 | HR 76 | Temp 97.7°F | Ht 73.0 in | Wt 290.0 lb

## 2021-01-04 DIAGNOSIS — F1120 Opioid dependence, uncomplicated: Secondary | ICD-10-CM | POA: Diagnosis not present

## 2021-01-04 DIAGNOSIS — M48061 Spinal stenosis, lumbar region without neurogenic claudication: Secondary | ICD-10-CM

## 2021-01-04 NOTE — Assessment & Plan Note (Signed)
PDMP reviewed No concerns 

## 2021-01-04 NOTE — Progress Notes (Signed)
Subjective:    Patient ID: Dakota Ellison, male    DOB: 1950/09/26, 70 y.o.   MRN: [id number]  HPI Here for follow up of chronic pain and narcotic dependence This visit occurred during the SARS-CoV-2 public health emergency.  Safety protocols were in place, including screening questions prior to the visit, additional usage of staff PPE, and extensive cleaning of exam room while observing appropriate contact time as indicated for disinfecting solutions.   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  Ongoing stable back pain Did get another steroid injection in May--did get transient improvement  Current Outpatient Medications on File Prior to Visit  Medication Sig Dispense Refill   ALPRAZolam (XANAX) 0.5 MG tablet TAKE 1 TABLET BY MOUTH THREE TIMES DAILYAS NEEDED 90 tablet 0   amLODipine (NORVASC) 10 MG tablet TAKE 1 TABLET BY MOUTH DAILY 90 tablet 3   Apple Cider Vinegar 500 MG TABS Take 500 mg by mouth daily.      cetirizine (ZYRTEC) 10 MG tablet Take 10 mg by mouth daily.     CINNAMON PO Take 200 mg by mouth daily.     ezetimibe (ZETIA) 10 MG tablet TAKE 1 TABLET BY MOUTH DAILY 90 tablet 3   fluticasone (FLONASE) 50 MCG/ACT nasal spray Place 2 sprays into both nostrils in the morning and at bedtime. (Patient taking differently: Place 2 sprays into both nostrils 2 (two) times daily as needed.) 16 g 0   Garlic 2000 MG CAPS Take 2,000 mg by mouth 2 (two) times daily.      Ginkgo Biloba 120 MG CAPS Take 120 mg by mouth daily.     HYDROcodone-acetaminophen (NORCO/VICODIN) 5-325 MG tablet TAKE 1 TABLET BY MOUTH THREE TIMES DAILYAS NEEDED FOR MODERATE PAIN 90 tablet 0   loperamide (IMODIUM A-D) 2 MG tablet Take 2 tablets (4 mg total) by mouth 4 (four) times daily as needed for diarrhea or loose stools. 30 tablet 0   Multiple Vitamin  (MULTIVITAMIN WITH MINERALS) TABS tablet Take 1 tablet by mouth daily.     niacin 500 MG CR capsule Take 500 mg by mouth 2 (two) times daily.     omeprazole (PRILOSEC) 20 MG capsule TAKE 1 CAPSULE BY MOUTH DAILY 90 capsule 3   sertraline (ZOLOFT) 25 MG tablet TAKE 1 TABLET BY MOUTH DAILY 90 tablet 3   sildenafil (VIAGRA) 100 MG tablet Take as directed (Patient taking differently: Take 100 mg by mouth daily as needed for erectile dysfunction. Take as directed) 10 tablet 5   SYSTANE ULTRA 0.4-0.3 % SOLN Place 1 drop into both eyes 4 (four) times daily as needed (dry/irritated eyes.).     testosterone cypionate (DEPOTESTOSTERONE CYPIONATE) 200 MG/ML injection Inject 200 mg into the muscle every 14 (fourteen) days. Saturdays     triamcinolone (KENALOG) 0.1 % Apply 1 application topically 2 (two) times daily as needed. 45 g 1   Turmeric (QC TUMERIC COMPLEX PO) Take 1 tablet by mouth daily.     valsartan-hydrochlorothiazide (DIOVAN-HCT) 320-25 MG tablet TAKE 1 TABLET BY MOUTH DAILY 90 tablet 3   No current facility-administered medications on file prior to visit.    Allergies  Allergen Reactions   Atorvastatin Other (See Comments)    Muscle weakness in his legs   Crestor [Rosuvastatin] Other (See Comments)    Severe muscle cramping  Zocor [Simvastatin] Other (See Comments)    Muscle weakness in his legs    Past Medical History:  Diagnosis Date   Anxiety state 04/08/2014   denies   Complication of anesthesia    and trouble waking up   Degenerative disc disease    Dysthymia    denies   ED (erectile dysfunction)    GERD (gastroesophageal reflux disease)    History of MRSA infection    on lip from cut   Hyperlipemia    Hypertension    take BP med for small leak in heart to keep it small per pt.   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   Procedure: LEFT TOTAL KNEE ARTHROPLASTY;  Surgeon: [name], MD;  Location: [facility];  Service: Orthopedics;  Laterality: Left;    TOTAL KNEE ARTHROPLASTY Right 12/17/2018   Procedure: TOTAL KNEE ARTHROPLASTY;  Surgeon: [name], MD;  Location: [facility];  Service: Orthopedics;  Laterality: Right;     Family History  Problem Relation Age of Onset   Pneumonia Brother     Social History   Socioeconomic History   Marital status: Single    Spouse name: Not on file   Number of children: 1   Years of education: Not on file   Highest education level: Not on file  Occupational History   Occupation: long distance driver    Comment: Retired   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, materials  Tobacco Use   Smoking status: Never   Smokeless tobacco: Never   Tobacco comments:    CIGARS ONLY--QUIT 2013  Vaping Use   Vaping Use: Never used  Substance and Sexual Activity   Alcohol use: Yes    Alcohol/week: 0.0 standard drinks    Comment: rare//occassional   Drug use: No   Sexual activity: Yes  Other Topics Concern   Not on file  Social History Narrative   Has living will   Son is health care POA   Would accept resuscitation attempts   Likely wouldn't want extended tube feeds if cognitively unaware   Social Determinants of Health   Financial Resource Strain: Not on file  Food Insecurity: Not on file  Transportation Needs: Not on file  Physical Activity: Not on file  Stress: Not on file  Social Connections: Not on file  Intimate Partner Violence:  Not on file   Review of Systems     Objective:   Physical Exam Constitutional:      Appearance: Normal appearance.  Neurological:     Mental Status: He is alert.  Psychiatric:        Mood and Affect: Mood normal.        Behavior: Behavior normal.           Assessment &  Plan:

## 2021-01-04 NOTE — Assessment & Plan Note (Signed)
Ongoing but stable pain Does okay and maintains function (even working) with the norco

## 2021-01-07 LAB — DM TEMPLATE

## 2021-01-07 LAB — DRUG MONITORING, PANEL 8 WITH CONFIRMATION, URINE
6 Acetylmorphine: NEGATIVE ng/mL (ref <10)
Alcohol Metabolites: NEGATIVE ng/mL (ref <500)
Alphahydroxyalprazolam: 143 ng/mL — ABNORMAL HIGH (ref <25)
Alphahydroxymidazolam: NEGATIVE ng/mL (ref <50)
Alphahydroxytriazolam: NEGATIVE ng/mL (ref <50)
Aminoclonazepam: NEGATIVE ng/mL (ref <25)
Amphetamines: NEGATIVE ng/mL (ref <500)
Benzodiazepines: POSITIVE ng/mL — AB (ref <100)
Buprenorphine, Urine: NEGATIVE ng/mL (ref <5)
Cocaine Metabolite: NEGATIVE ng/mL (ref <150)
Codeine: NEGATIVE ng/mL (ref <50)
Creatinine: 254 mg/dL (ref >=20.0)
Hydrocodone: 1035 ng/mL — ABNORMAL HIGH (ref <50)
Hydromorphone: 360 ng/mL — ABNORMAL HIGH (ref <50)
Hydroxyethylflurazepam: NEGATIVE ng/mL (ref <50)
Lorazepam: NEGATIVE ng/mL (ref <50)
MDMA: NEGATIVE ng/mL (ref <500)
Marijuana Metabolite: NEGATIVE ng/mL (ref <20)
Morphine: NEGATIVE ng/mL (ref <50)
Nordiazepam: NEGATIVE ng/mL (ref <50)
Norhydrocodone: 1095 ng/mL — ABNORMAL HIGH (ref <50)
Opiates: POSITIVE ng/mL — AB (ref <100)
Oxazepam: NEGATIVE ng/mL (ref <50)
Oxidant: NEGATIVE ug/mL (ref <200)
Oxycodone: NEGATIVE ng/mL (ref <100)
Temazepam: NEGATIVE ng/mL (ref <50)
pH: 5.2 (ref 4.5–9.0)

## 2021-01-13 ENCOUNTER — Other Ambulatory Visit: Payer: Self-pay | Admitting: Internal Medicine

## 2021-01-14 NOTE — Telephone Encounter (Signed)
Last filled 11-30-20 #90 Last OV 01-04-21 Next OV 05-11-21 [facility]

## 2021-01-25 ENCOUNTER — Other Ambulatory Visit: Payer: Self-pay | Admitting: Internal Medicine

## 2021-01-26 NOTE — Telephone Encounter (Signed)
Name of Medication: Hydrocodone Name of Pharmacy: [facility] Last Fill or Written Date and Quantity: 12-24-20 #90 Last Office Visit and Type: 01-04-21 Next Office Visit and Type: 05-11-21 Last Controlled Substance Agreement Date: 01-04-21 Last UDS: 01-04-21

## 2021-02-11 ENCOUNTER — Telehealth: Payer: Self-pay | Admitting: Internal Medicine

## 2021-02-11 DIAGNOSIS — Z9989 Dependence on other enabling machines and devices: Secondary | ICD-10-CM

## 2021-02-11 DIAGNOSIS — G4733 Obstructive sleep apnea (adult) (pediatric): Secondary | ICD-10-CM

## 2021-02-11 NOTE — Telephone Encounter (Signed)
Ok to refer to Dr Jenne Pane or [facility]/ ENT to consider [facility] for OSA

## 2021-02-11 NOTE — Telephone Encounter (Signed)
Called and spoke with pt. Pt is wanting a referral to see if he can be a candidate for inspire instead of having to wear cpap.  Dr. Maple Hudson, please advise if you are okay with [place] placing referral for pt.

## 2021-02-12 NOTE — Telephone Encounter (Signed)
Looking at the list of requirements needed, I do not think that he qualifies.  LMTCB.

## 2021-02-15 NOTE — Telephone Encounter (Signed)
Pt has returned call & can be reached at. [phone]

## 2021-02-15 NOTE — Telephone Encounter (Signed)
LMTCB

## 2021-02-19 NOTE — Telephone Encounter (Signed)
Spoke with patient to let him know that Dr. Maple Hudson was ok with [place] placing ENT referral to see if he is a candidate for the [facility] device. He expressed understanding. Order has been placed. Advised him that once they get it they should call to get him scheduled for appt. Nothing further needed at this time.

## 2021-02-23 ENCOUNTER — Other Ambulatory Visit: Payer: Self-pay | Admitting: Internal Medicine

## 2021-02-23 NOTE — Telephone Encounter (Signed)
Name of Medication: Hydrocodone Name of Pharmacy: [facility] Last Fill or Written Date and Quantity: 01-26-21 #90 Last Office Visit and Type: 01-04-21 Next Office Visit and Type: 05-11-21 Last Controlled Substance Agreement Date: 01-04-21 Last UDS: 01-04-21

## 2021-03-01 ENCOUNTER — Telehealth: Payer: Self-pay | Admitting: Internal Medicine

## 2021-03-01 NOTE — Chronic Care Management (AMB) (Signed)
  Chronic Care Management   Outreach Note  03/01/2021 Name: Dakota Ellison MRN: [id number] DOB: 1950/09/30  Referred by: [name], MD Reason for referral : No chief complaint on file.   An unsuccessful telephone outreach was attempted today. The patient was referred to the pharmacist for assistance with care management and care coordination.   Follow Up Plan:   Tatjana Dellinger Upstream Scheduler

## 2021-03-01 NOTE — Progress Notes (Signed)
  Chronic Care Management   Note  03/01/2021 Name: SHLOIMY MICHALSKI MRN: [id number] DOB: 1950/11/15  DANDY LAZARO is a 70 y.o. year old male who is a primary care patient of [name], MD. I reached out to Chauncey Mann by phone today in response to a referral sent by Mr. Courage Biglow Tenpenny's PCP, [name], MD.   Mr. Vavra was given information about Chronic Care Management services today including:  CCM service includes personalized support from designated clinical staff supervised by his physician, including individualized plan of care and coordination with other care providers 24/7 contact phone numbers for assistance for urgent and routine care needs. Service will only be billed when office clinical staff spend 20 minutes or more in a month to coordinate care. Only one practitioner may furnish and bill the service in a calendar month. The patient may stop CCM services at any time (effective at the end of the month) by phone call to the office staff.   Patient agreed to services and verbal consent obtained.   Follow up plan:  Tatjana Restaurant manager, fast food

## 2021-03-04 ENCOUNTER — Telehealth (INDEPENDENT_AMBULATORY_CARE_PROVIDER_SITE_OTHER): Payer: Medicare Other | Admitting: Family Medicine

## 2021-03-04 ENCOUNTER — Telehealth: Payer: Self-pay

## 2021-03-04 ENCOUNTER — Encounter: Payer: Self-pay | Admitting: Family Medicine

## 2021-03-04 DIAGNOSIS — J069 Acute upper respiratory infection, unspecified: Secondary | ICD-10-CM

## 2021-03-04 NOTE — Progress Notes (Signed)
Virtual Visit via Video Note  I connected with Dakota Ellison on 03/04/21 at 11:30 AM EDT by a video enabled telemedicine application and verified that I am speaking with the correct person using two identifiers.  Location: Patient: home Provider: office    I discussed the limitations of evaluation and management by telemedicine and the availability of in person appointments. The patient expressed understanding and agreed to proceed.  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 but it is always possible  Anza daughter had a runny nose   Does not have a home covid test   Otc: Has not started taking anything  Takes hydrocodone for cough  Zyrtec daily  No nasal sprays   Imm status: Covid immunized  shot and booster and has had covid    Patient Active Problem List   Diagnosis Date Noted   Viral URI with cough 03/04/2021   Cyst of left kidney 12/16/2019   Advance directive discussed with patient 03/27/2019   Osteoarthritis of right knee 12/17/2018   Chronic narcotic dependence (HCC) 10/09/2018   Low back pain 04/11/2015   Wellness examination 10/07/2014   Mood disorder (HCC) 10/07/2014   Impaired glucose tolerance 10/04/2013   Inguinal hernia unilateral, non-recurrent, left 10/17/2012   Bladder neck obstruction 10/04/2010   Obstructive sleep apnea on CPAP 10/06/2008   RECTAL FISSURE 05/14/2008   Testosterone deficiency 10/02/2007   Hyperlipidemia 07/13/2007    Morbid obesity (HCC) 07/13/2007   ERECTILE DYSFUNCTION 07/13/2007   Essential hypertension 07/13/2007   Lumbar spinal stenosis 07/13/2007   Asthma 07/12/2007   GERD 07/12/2007   OA (osteoarthritis) of knee 07/12/2007   Past Medical History:  Diagnosis Date   Anxiety state 04/08/2014   denies   Complication of anesthesia    and trouble waking up   Degenerative disc disease    Dysthymia    denies   ED (erectile dysfunction)    GERD (gastroesophageal reflux disease)    History of MRSA infection    on lip from cut   Hyperlipemia    Hypertension    take BP med for small leak in heart to keep it small per pt.   Mild asthma    as a teen   Morbid obesity (HCC)    OSA (obstructive sleep apnea)    Osteoarthritis    PONV (postoperative nausea and vomiting)    Rectal fissure    Testosterone deficiency    Past Surgical History:  Procedure Laterality Date   HERNIA REPAIR     1998   KNEE SURGERY  09/2010   left knee and right knee arthroscopy   OTHER SURGICAL HISTORY     arthroscopic subacromial decompression   OTHER SURGICAL HISTORY     open resection, distal right clavicle   SHOULDER ARTHROSCOPY     right   SPHINCTEROTOMY     TOTAL KNEE ARTHROPLASTY Left 06/11/2018  Procedure: LEFT TOTAL KNEE ARTHROPLASTY;  Surgeon: [name], MD;  Location: [facility];  Service: Orthopedics;  Laterality: Left;    TOTAL KNEE ARTHROPLASTY Right 12/17/2018   Procedure: TOTAL KNEE ARTHROPLASTY;  Surgeon: [name], MD;  Location: [facility];  Service: Orthopedics;  Laterality: Right;    Social History   Tobacco Use   Smoking status: Never   Smokeless tobacco: Never   Tobacco comments:    CIGARS ONLY--QUIT 2013  Vaping Use   Vaping Use: Never used  Substance Use Topics   Alcohol use: Yes    Alcohol/week: 0.0 standard drinks    Comment: rare//occassional   Drug use: No   Family History  Problem Relation Age of Onset   Pneumonia Brother    Allergies  Allergen Reactions    Atorvastatin Other (See Comments)    Muscle weakness in his legs   Crestor [Rosuvastatin] Other (See Comments)    Severe muscle cramping   Zocor [Simvastatin] Other (See Comments)    Muscle weakness in his legs   Current Outpatient Medications on File Prior to Visit  Medication Sig Dispense Refill   ALPRAZolam (XANAX) 0.5 MG tablet TAKE 1 TABLET BY MOUTH THREE TIMES DAILYAS NEEDED 90 tablet 0   amLODipine (NORVASC) 10 MG tablet TAKE 1 TABLET BY MOUTH DAILY 90 tablet 3   Apple Cider Vinegar 500 MG TABS Take 500 mg by mouth daily.      cetirizine (ZYRTEC) 10 MG tablet Take 10 mg by mouth daily.     CINNAMON PO Take 200 mg by mouth daily.     ezetimibe (ZETIA) 10 MG tablet TAKE 1 TABLET BY MOUTH DAILY 90 tablet 3   fluticasone (FLONASE) 50 MCG/ACT nasal spray Place 2 sprays into both nostrils in the morning and at bedtime. (Patient taking differently: Place 2 sprays into both nostrils 2 (two) times daily as needed.) 16 g 0   Garlic 2000 MG CAPS Take 2,000 mg by mouth 2 (two) times daily.      Ginkgo Biloba 120 MG CAPS Take 120 mg by mouth daily.     HYDROcodone-acetaminophen (NORCO/VICODIN) 5-325 MG tablet TAKE 1 TABLET BY MOUTH THREE TIMES DAILYAS NEEDED FOR MODERATE PAIN 90 tablet 0   loperamide (IMODIUM A-D) 2 MG tablet Take 2 tablets (4 mg total) by mouth 4 (four) times daily as needed for diarrhea or loose stools. 30 tablet 0   Multiple Vitamin (MULTIVITAMIN WITH MINERALS) TABS tablet Take 1 tablet by mouth daily.     niacin 500 MG CR capsule Take 500 mg by mouth 2 (two) times daily.     omeprazole (PRILOSEC) 20 MG capsule TAKE 1 CAPSULE BY MOUTH DAILY 90 capsule 3   sertraline (ZOLOFT) 25 MG tablet TAKE 1 TABLET BY MOUTH DAILY 90 tablet 3   sildenafil (VIAGRA) 100 MG tablet Take as directed (Patient taking differently: Take 100 mg by mouth daily as needed for erectile dysfunction. Take as directed) 10 tablet 5   SYSTANE ULTRA 0.4-0.3 % SOLN Place 1 drop into both eyes 4 (four) times  daily as needed (dry/irritated eyes.).     testosterone cypionate (DEPOTESTOSTERONE CYPIONATE) 200 MG/ML injection Inject 200 mg into the muscle every 14 (fourteen) days. Saturdays     triamcinolone (KENALOG) 0.1 % Apply 1 application topically 2 (two) times daily as needed. 45 g 1   Turmeric (QC TUMERIC COMPLEX PO) Take 1 tablet by mouth daily.     valsartan-hydrochlorothiazide (DIOVAN-HCT) 320-25 MG tablet TAKE 1 TABLET BY  MOUTH DAILY 90 tablet 3   No current facility-administered medications on file prior to visit.   Review of Systems  Constitutional:  Negative for chills, fever and malaise/fatigue.  HENT:  Positive for congestion and sore throat. Negative for ear pain and sinus pain.   Eyes:  Negative for blurred vision, discharge and redness.  Respiratory:  Positive for cough and sputum production. Negative for shortness of breath, wheezing and stridor.   Cardiovascular:  Negative for chest pain, palpitations and leg swelling.  Gastrointestinal:  Negative for abdominal pain, diarrhea, nausea and vomiting.  Musculoskeletal:  Negative for myalgias.  Skin:  Negative for rash.  Neurological:  Negative for dizziness and headaches.   Observations/Objective: Patient appears well, in no distress Weight is baseline  No facial swelling or asymmetry Normal voice-not hoarse and no slurred speech No obvious tremor or mobility impairment Moving neck and UEs normally Able to hear the call well  No cough or shortness of breath during interview  Pt does clear his throat  Talkative and mentally sharp with no cognitive changes No skin changes on face or neck , no rash or pallor Affect is normal    Assessment and Plan: Problem List Items Addressed This Visit       Respiratory   Viral URI with cough    Mild symptoms without fever Recommend covid test-pt plans to do home test and call  Discussed symptom care  Disc ER parameters Fluids/rest Consider mucinex DM for cough/congestion Tylenol  prn as well  Update if not starting to improve in a week or if worsening          Follow Up Instructions:    Problem List Items Addressed This Visit       Respiratory   Viral URI with cough    Mild symptoms without fever Recommend covid test-pt plans to do home test and call  Discussed symptom care  Disc ER parameters Fluids/rest Consider mucinex DM for cough/congestion Tylenol prn as well  Update if not starting to improve in a week or if worsening         I discussed the assessment and treatment plan with the patient. The patient was provided an opportunity to ask questions and all were answered. The patient agreed with the plan and demonstrated an understanding of the instructions.   The patient was advised to call back or seek an in-person evaluation if the symptoms worsen or if the condition fails to improve as anticipated.     Roxy Manns, MD

## 2021-03-04 NOTE — Assessment & Plan Note (Signed)
Mild symptoms without fever Recommend covid test-pt plans to do home test and call  Discussed symptom care  Disc ER parameters Fluids/rest Consider mucinex DM for cough/congestion Tylenol prn as well  Update if not starting to improve in a week or if worsening

## 2021-03-04 NOTE — Telephone Encounter (Signed)
Pt already has video visit scheduled 03/04/21 at 11:30 with Dr Milinda Antis. Sending note to Dr Milinda Antis.

## 2021-03-04 NOTE — Telephone Encounter (Signed)
Good, glad to hear that  Treat symptoms as we discussed and can return to work when he feels better If symptoms worsen or become severe please alert [place] and go to ER if necessary

## 2021-03-04 NOTE — Telephone Encounter (Signed)
Dakota Ellison - Client TELEPHONE ADVICE RECORD [facility] Patient Name: Dakota Ellison Gender: Male DOB: [birth year] Age: 70 Y 5 M 15 D Return Phone Number: [phone] (Primary) Address: City/ State/ Zip: [place] [address] Client [place] [facility] Ellison - Client Client Site Dakota Ellison [place] - Ellison Physician Tillman Abide- MD Contact Type Call Who Is Calling Patient / Member / Family / Caregiver Call Type Triage / Clinical Relationship To Patient Self Return Phone Number [phone] (Primary) Chief Complaint Cough Reason for Call Symptomatic / Request for Health Information Initial Comment Call sent from office. Caller states he started getting sick on Tuesday was sneezing with a runny nose and sore throat. Now he has a cough with colored phlegm. Requesting abx. Translation No Nurse Assessment Nurse: Izola Price, RN, Beth Date/Time (Eastern Time): 03/04/2021 9:15:02 AM Confirm and document reason for call. If symptomatic, describe symptoms. ---Call sent from office. Caller states he started getting sick on Tuesday was sneezing with a runny nose and sore throat. Caller states he has a cough with colored phlegm. Caller has no thermometer to check temp. Does the patient have any new or worsening symptoms? ---Yes Will a triage be completed? ---Yes Related visit to physician within the last 2 weeks? ---No Does the PT have any chronic conditions? (i.e. diabetes, asthma, this includes High risk factors for pregnancy, etc.) ---No Is this a behavioral health or substance abuse call? ---No Guidelines Guideline Title Affirmed Question Affirmed Notes Nurse Date/Time (Eastern Time) COVID-19 - Diagnosed or Suspected [1] Continuous (nonstop) coughing interferes with work or school AND [2] no improvement using cough treatment per Ellison Advice Izola Price, RN, Beth 03/04/2021 9:16:20 AM Disp. Time  [name] Time) Disposition Final User PLEASE NOTE: All timestamps contained within this report are represented as [place] Standard Time. CONFIDENTIALTY NOTICE: This fax transmission is intended only for the addressee. It contains information that is legally privileged, confidential or otherwise protected from use or disclosure. If you are not the intended recipient, you are strictly prohibited from reviewing, disclosing, copying using or disseminating any of this information or taking any action in reliance on or regarding this information. If you have received this fax in error, please notify us immediately by telephone so that we can arrange for its return to [place]. Phone: [phone], Toll-Free: [phone], Fax: [phone] Page: 2 of 2 Call Id: [id number] 03/04/2021 9:21:30 AM Call PCP within 24 Hours Yes Izola Price, RN, Waynetta Sandy Caller Disagree/Comply Comply Caller Understands Yes PreDisposition Call Doctor Ellison Advice Given Per Guideline CALL PCP WITHIN 24 HOURS: * You need to discuss this with your doctor (or NP/PA) within the next 24 hours. * IF OFFICE WILL BE OPEN: Call the office when it opens tomorrow morning. GENERAL Ellison ADVICE FOR COVID-19 SYMPTOMS: * The symptoms are generally treated the same whether you have COVID-19, influenza or some other respiratory virus. * Cough: Use cough drops. * Feeling dehydrated: Drink extra liquids. If the air in your home is dry, use a humidifier. * COUGH DROPS: Over-thecounter cough drops can help a lot, especially for mild coughs. They soothe an irritated throat and remove the tickle sensation in the back of the throat. Cough drops are easy to carry with you. COUGH MEDICINES: * HOME REMEDY - HONEY: This old home remedy has been shown to help decrease coughing at night. The adult dosage is 2 teaspoons (10 ml) at bedtime. * Fever over 103 F (39.4 C) * Chest pain or difficulty breathing occurs *  You become worse CALL BACK IF: Ellison ADVICE given per COVID-19 -  DIAGNOSED OR SUSPECTED (Adult) guideline. * COUGH SYRUP WITH DEXTROMETHORPHAN: An over-the-counter cough syrup can help your cough. The most common cough suppressant in over-the-counter cough medicines is dextromethorphan. Referrals REFERRED TO PCP OFFICE

## 2021-03-04 NOTE — Telephone Encounter (Signed)
Pt  has a negative covid test

## 2021-03-04 NOTE — Patient Instructions (Signed)
Do a home covid test and let us know if it is positive   Drink fluids and rest  mucinex DM is good for cough and congestion  Nasal saline for congestion as needed  Flonase daily may help also  Tylenol for fever or pain or headache  Please alert [place] if symptoms worsen (if severe or short of breath please go to the ER)

## 2021-03-04 NOTE — Telephone Encounter (Signed)
Pt had MyChart video visit with Dr. Milinda Antis today.  She wanted me to call pt for results of home COVID test.  Spoke with pt asking for results.  States it's still processing and he is currently at the store.  Pt will call back with the results.

## 2021-03-05 NOTE — Telephone Encounter (Signed)
Pt notified of Dr. Tower's comments and verbalized understanding  

## 2021-03-09 MED ORDER — AMOXICILLIN 500 MG PO TABS: 1000.0000 mg | ORAL_TABLET | Freq: Two times a day (BID) | ORAL | 0 refills | Status: AC

## 2021-03-09 NOTE — Addendum Note (Signed)
Addended by: Tillman Abide I on: 03/09/2021 01:02 PM   Modules accepted: Orders

## 2021-03-09 NOTE — Telephone Encounter (Signed)
Please let him know that I sent in an antibiotic prescription to help in case it has become a bacterial sinus infection

## 2021-03-09 NOTE — Telephone Encounter (Signed)
Did a virtual visit with Dr Milinda Antis 03-04-21.

## 2021-03-09 NOTE — Telephone Encounter (Signed)
Spoke to pt

## 2021-03-09 NOTE — Telephone Encounter (Signed)
Pt called stated he's still not feeling good still has a cough would like to have Antibiotics call in . Please Advise

## 2021-03-11 DIAGNOSIS — M48061 Spinal stenosis, lumbar region without neurogenic claudication: Secondary | ICD-10-CM | POA: Diagnosis not present

## 2021-03-11 DIAGNOSIS — M4310 Spondylolisthesis, site unspecified: Secondary | ICD-10-CM | POA: Diagnosis not present

## 2021-03-11 DIAGNOSIS — G8918 Other acute postprocedural pain: Secondary | ICD-10-CM | POA: Diagnosis not present

## 2021-03-11 DIAGNOSIS — M545 Low back pain, unspecified: Secondary | ICD-10-CM | POA: Diagnosis not present

## 2021-03-22 ENCOUNTER — Other Ambulatory Visit: Payer: Self-pay

## 2021-03-22 ENCOUNTER — Encounter: Payer: Self-pay | Admitting: Cardiovascular Disease

## 2021-03-22 ENCOUNTER — Ambulatory Visit: Payer: Medicare Other | Admitting: Cardiovascular Disease

## 2021-03-22 ENCOUNTER — Telehealth: Payer: Self-pay | Admitting: Internal Medicine

## 2021-03-22 VITALS — BP 130/70 | HR 82 | Ht 72.0 in | Wt 289.5 lb

## 2021-03-22 DIAGNOSIS — I10 Essential (primary) hypertension: Secondary | ICD-10-CM | POA: Diagnosis not present

## 2021-03-22 DIAGNOSIS — G4733 Obstructive sleep apnea (adult) (pediatric): Secondary | ICD-10-CM | POA: Diagnosis not present

## 2021-03-22 DIAGNOSIS — E782 Mixed hyperlipidemia: Secondary | ICD-10-CM

## 2021-03-22 NOTE — Patient Instructions (Addendum)
Medication Instructions:  No changes  If you need a refill on your cardiac medications before your next appointment, please call your pharmacy.   Lab work: No new labs needed  Testing/Procedures: No new testing needed  Follow-Up: At [facility], you and your health needs are our priority.  As part of our continuing mission to provide you with exceptional heart care, we have created designated Provider Care Teams.  These Care Teams include your primary Cardiologist (physician) and Advanced Practice Providers (APPs -  Physician Assistants and Nurse Practitioners) who all work together to provide you with the care you need, when you need it.  You will need a follow up appointment in 12 months  Providers on your designated Care Team:   Christopher Berge, NP Ryan Dunn, PA-C Jacquelyn Visser, PA-C Cadence Furth, PA-C  COVID-19 Vaccine Information can be found at: [email or web address] For questions related to vaccine distribution or appointments, please email [email or web address] or call [phone].    

## 2021-03-22 NOTE — Telephone Encounter (Signed)
Tried to call pt. No answer and VM is full so I could not leave a message

## 2021-03-22 NOTE — Progress Notes (Signed)
Cardiology Office Note  Date:  03/22/2021   ID:  Dakota Ellison, Dakota Ellison 1951/02/05, MRN [id number]  PCP:  Dakota Schwalbe, MD   Chief Complaint  Patient presents with   12 month follow up     "Doing well." Medications reviewed by the patient verbally.     HPI:  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,   Plays in [employer]  Recent imaging reviewed Simple cyst arising from upper pole left kidney measuring 4.3 x 7.0 x 5.6 cm,  Being monitored  Prior events from last year, covid in Aug 2020 Treated with steroids through ER  "weird taste", still not back all the way  Chronic orthopedic issues, back and knee bilaterally B/l knee replacements  06/2018 (l), 12/2018 (r)  EKG personally reviewed by myself on todays visit Shows normal sinus rhythm rate 82 bpm no significant ST or T wave changes PACs  Problems tolerating statins including Crestor, simvastatin, Lipitor Able to tolerate Zetia Previously with myalgias on the others  Other past medical history previous lower extremity ultrasound. By his report, this was normal with no evidence of PAD.   Last echocardiogram in March 2008 showing normal systolic function, right ventricle mildly dilated, mild mitral regurg, mild tricuspid regurg. Last stress test in March 2008 where he achieved 10 mets, exercised for 9 minutes, no ischemia noted. Overall a normal,  low-risk scan.    PMH:   has a past medical history of Anxiety state (04/08/2014), Complication of anesthesia,  Degenerative disc disease, Dysthymia, ED (erectile dysfunction), GERD (gastroesophageal reflux disease), History of MRSA infection, Hyperlipemia, Hypertension, Mild asthma, Morbid obesity (HCC), OSA (obstructive sleep apnea), Osteoarthritis, PONV (postoperative nausea and vomiting), Rectal fissure, and Testosterone deficiency.  PSH:    Past Surgical History:  Procedure Laterality Date   HERNIA REPAIR     1998   KNEE SURGERY  09/2010   left knee and right knee arthroscopy   OTHER SURGICAL HISTORY     arthroscopic subacromial decompression   OTHER SURGICAL HISTORY     open resection, distal right clavicle   SHOULDER ARTHROSCOPY     right   SPHINCTEROTOMY     TOTAL KNEE ARTHROPLASTY Left 06/11/2018   Procedure: LEFT TOTAL KNEE ARTHROPLASTY;  Surgeon: [name], MD;  Location: [facility];  Service: Orthopedics;  Laterality: Left;    TOTAL KNEE ARTHROPLASTY Right 12/17/2018   Procedure: TOTAL KNEE ARTHROPLASTY;  Surgeon: [name], MD;  Location: [facility];  Service: Orthopedics;  Laterality: Right;     Current Outpatient Medications  Medication Sig Dispense Refill   ALPRAZolam (XANAX) 0.5 MG tablet TAKE 1 TABLET BY MOUTH THREE TIMES DAILYAS NEEDED 90 tablet 0   amLODipine (NORVASC) 10 MG tablet TAKE 1 TABLET BY MOUTH DAILY 90 tablet 3   Apple Cider Vinegar 500 MG TABS Take 500 mg by mouth daily.      cetirizine (ZYRTEC) 10 MG tablet Take 10 mg by mouth daily.     CINNAMON PO Take 200 mg by mouth daily.     ezetimibe (ZETIA) 10  MG tablet TAKE 1 TABLET BY MOUTH DAILY 90 tablet 3   fluticasone (FLONASE) 50 MCG/ACT nasal spray Place 2 sprays into both nostrils in the morning and at bedtime. 16 g 0   Garlic 2000 MG CAPS Take 2,000 mg by mouth 2 (two) times daily.      Ginkgo Biloba 120 MG CAPS Take 120 mg by mouth daily.     HYDROcodone-acetaminophen (NORCO/VICODIN) 5-325 MG tablet TAKE 1 TABLET BY MOUTH THREE TIMES DAILYAS NEEDED FOR MODERATE PAIN 90 tablet 0   loperamide  (IMODIUM A-D) 2 MG tablet Take 2 tablets (4 mg total) by mouth 4 (four) times daily as needed for diarrhea or loose stools. 30 tablet 0   Multiple Vitamin (MULTIVITAMIN WITH MINERALS) TABS tablet Take 1 tablet by mouth daily.     niacin 500 MG CR capsule Take 500 mg by mouth 2 (two) times daily.     omeprazole (PRILOSEC) 20 MG capsule TAKE 1 CAPSULE BY MOUTH DAILY 90 capsule 3   sertraline (ZOLOFT) 25 MG tablet TAKE 1 TABLET BY MOUTH DAILY 90 tablet 3   sildenafil (VIAGRA) 100 MG tablet Take as directed (Patient taking differently: Take 100 mg by mouth daily as needed for erectile dysfunction. Take as directed) 10 tablet 5   SYSTANE ULTRA 0.4-0.3 % SOLN Place 1 drop into both eyes 4 (four) times daily as needed (dry/irritated eyes.).     testosterone cypionate (DEPOTESTOSTERONE CYPIONATE) 200 MG/ML injection Inject 200 mg into the muscle every 14 (fourteen) days. Saturdays     triamcinolone (KENALOG) 0.1 % Apply 1 application topically 2 (two) times daily as needed. 45 g 1   Turmeric (QC TUMERIC COMPLEX PO) Take 1 tablet by mouth daily.     valsartan-hydrochlorothiazide (DIOVAN-HCT) 320-25 MG tablet TAKE 1 TABLET BY MOUTH DAILY 90 tablet 3   No current facility-administered medications for this visit.     Allergies:   Atorvastatin, Crestor [rosuvastatin], and Zocor [simvastatin]   Social History:  The patient  reports that he has never smoked. He has never used smokeless tobacco. He reports current alcohol use. He reports that he does not use drugs.   Family History:   family history includes Pneumonia in his brother.    Review of Systems: Review of Systems  Constitutional: Negative.   Respiratory: Negative.    Cardiovascular: Negative.   Gastrointestinal: Negative.   Musculoskeletal:  Positive for back pain and joint pain.  Neurological: Negative.   Psychiatric/Behavioral: Negative.    All other systems reviewed and are negative.  PHYSICAL EXAM: VS:  BP 130/70 (BP Location: Left  Arm, Patient Position: Sitting, Cuff Size: Large)   Pulse 82   Ht 6' (1.829 m)   Wt 289 lb 8 oz (131.3 kg)   SpO2 98%   BMI 39.26 kg/m  , BMI Body mass index is 39.26 kg/m. Constitutional:  oriented to person, place, and time. No distress.  HENT:  Head: Grossly normal Eyes:  no discharge. No scleral icterus.  Neck: No JVD, no carotid bruits  Cardiovascular: Regular rate and rhythm, no murmurs appreciated Pulmonary/Chest: Clear to auscultation bilaterally, no wheezes or rails Abdominal: Soft.  no distension.  no tenderness.  Musculoskeletal: Normal range of motion Neurological:  normal muscle tone. Coordination normal. No atrophy Skin: Skin warm and dry Psychiatric: normal affect, pleasant   Recent Labs: 04/24/2020: ALT 36; BUN 17; Creatinine, Ser 1.06; Hemoglobin 15.9; Platelets 274.0; Potassium 3.9; Sodium 138    Lipid Panel Lab Results  Component Value Date  CHOL 131 04/24/2020   HDL 29.80 (L) 04/24/2020   LDLCALC 62 04/24/2020   TRIG 192.0 (H) 04/24/2020      Wt Readings from Last 3 Encounters:  03/22/21 289 lb 8 oz (131.3 kg)  03/04/21 275 lb (124.7 kg)  01/04/21 290 lb (131.5 kg)      ASSESSMENT AND PLAN:   Essential hypertension - Plan: EKG 12-Lead Blood pressure is well controlled on today's visit. No changes made to the medication.  Pure hypercholesterolemia Myalgias on statin Tolerating Zetia, numbers at goal Calcium score of 0 No aortic atherosclerosis No further work-up needed  Obstructive sleep apnea on CPAP Tolerating his CPAP, compliant Prior Covid infection Recovered, no reinfection  Chronic back pain Recent cortisone shot at [facility]   Total encounter time more than 25 minutes  Greater than 50% was spent in counseling and coordination of care with the patient   Orders Placed This Encounter  Procedures   EKG 12-Lead      Signed, Dossie Arbour, M.D., Ph.D. 03/22/2021  [facility] [place],  [place] [phone]

## 2021-03-22 NOTE — Telephone Encounter (Signed)
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 Pt stated that the only way to get CDL card is for provider to fill out form stating its ok for him to use the medication while driving

## 2021-03-23 NOTE — Telephone Encounter (Signed)
Pt just need letter from provider to say its ok for him to take medication. Pt said to call if need more clarification

## 2021-03-23 NOTE — Telephone Encounter (Signed)
Left message on VM per DPR to see what needs to be done.

## 2021-03-24 ENCOUNTER — Other Ambulatory Visit: Payer: Self-pay | Admitting: Internal Medicine

## 2021-03-24 NOTE — Telephone Encounter (Signed)
Created letter and put it MyChart. Spoke to pt.

## 2021-03-25 NOTE — Telephone Encounter (Signed)
Name of Medication: Hydrocodone Name of Pharmacy: [facility] Last Fill or Written Date and Quantity: 02-24-21 #90 Last Office Visit and Type: 01-04-21 Next Office Visit and Type: 05-11-21 Last Controlled Substance Agreement Date: 01-04-21 Last UDS: 01-04-21

## 2021-03-30 DIAGNOSIS — N5201 Erectile dysfunction due to arterial insufficiency: Secondary | ICD-10-CM | POA: Diagnosis not present

## 2021-04-02 DIAGNOSIS — N5201 Erectile dysfunction due to arterial insufficiency: Secondary | ICD-10-CM | POA: Diagnosis not present

## 2021-04-02 DIAGNOSIS — E291 Testicular hypofunction: Secondary | ICD-10-CM | POA: Diagnosis not present

## 2021-04-12 ENCOUNTER — Other Ambulatory Visit: Payer: Self-pay | Admitting: Internal Medicine

## 2021-04-13 NOTE — Telephone Encounter (Signed)
Medication transmission failed. I called it in to the pharmacy.

## 2021-04-13 NOTE — Telephone Encounter (Signed)
Last filled 01-14-21 #90 Last OV 01-04-21 Next OV 05-11-21 [facility]

## 2021-04-15 ENCOUNTER — Telehealth: Payer: Self-pay

## 2021-04-15 NOTE — Chronic Care Management (AMB) (Signed)
Chronic Care Management Pharmacy Assistant   Name: Dakota Ellison  MRN: [id number] DOB: February 23, 1951  Dakota Ellison is an 70 y.o. year old male who presents for his initial CCM visit with the clinical pharmacist.  Reason for Encounter: Initial Questions   Conditions to be addressed/monitored: HTN and HLD  Recent office visits:  03/04/21-Family Medicine-Dr.Tower-Telemedicine-Patient presented for congestion,nasal drip,cough.Recommend covid test, tylenol as needed mucinex DM for cough, increase fluids and update in 1 week. 01/04/21-PCP-Dr.Letvak-Patient presented for chronic pain and medication follow up, no medication changes. Follow up 4 months  Recent consult visits:  03/22/21-Cardiology-Dr.Gollan-Patient presented for follow up hypertension.No medication changes follow up 12 months 03/11/21-[facility]-Patient presented for steroid back injection (Kenalog) 10/26/20-[facility]-Patient presented for steroid back injection(Kenalog)  Hospital visits:  None in previous 6 months  Medications: Outpatient Encounter Medications as of 04/15/2021  Medication Sig   ALPRAZolam (XANAX) 0.5 MG tablet TAKE 1 TABLET BY MOUTH THREE TIMES DAILYAS NEEDED   amLODipine (NORVASC) 10 MG tablet TAKE 1 TABLET BY MOUTH DAILY   Apple Cider Vinegar 500 MG TABS Take 500 mg by mouth daily.    cetirizine (ZYRTEC) 10 MG tablet Take 10 mg by mouth daily.   CINNAMON PO Take 200 mg by mouth daily.   ezetimibe (ZETIA) 10 MG tablet TAKE 1 TABLET BY MOUTH DAILY   fluticasone (FLONASE) 50 MCG/ACT nasal spray Place 2 sprays into both nostrils in the morning and at bedtime.   Garlic 2000 MG CAPS Take 2,000 mg by mouth 2 (two) times daily.    Ginkgo Biloba 120 MG CAPS Take 120 mg by mouth daily.   HYDROcodone-acetaminophen (NORCO/VICODIN) 5-325 MG tablet TAKE 1 TABLET BY MOUTH THREE TIMES DAILYAS NEEDED FOR MODERATE PAIN   loperamide (IMODIUM A-D) 2 MG tablet Take 2 tablets (4 mg total) by mouth  4 (four) times daily as needed for diarrhea or loose stools.   Multiple Vitamin (MULTIVITAMIN WITH MINERALS) TABS tablet Take 1 tablet by mouth daily.   niacin 500 MG CR capsule Take 500 mg by mouth 2 (two) times daily.   omeprazole (PRILOSEC) 20 MG capsule TAKE 1 CAPSULE BY MOUTH DAILY   sertraline (ZOLOFT) 25 MG tablet TAKE 1 TABLET BY MOUTH DAILY   sildenafil (VIAGRA) 100 MG tablet Take as directed (Patient taking differently: Take 100 mg by mouth daily as needed for erectile dysfunction. Take as directed)   SYSTANE ULTRA 0.4-0.3 % SOLN Place 1 drop into both eyes 4 (four) times daily as needed (dry/irritated eyes.).   testosterone cypionate (DEPOTESTOSTERONE CYPIONATE) 200 MG/ML injection Inject 200 mg into the muscle every 14 (fourteen) days. Saturdays   triamcinolone (KENALOG) 0.1 % Apply 1 application topically 2 (two) times daily as needed.   Turmeric (QC TUMERIC COMPLEX PO) Take 1 tablet by mouth daily.   valsartan-hydrochlorothiazide (DIOVAN-HCT) 320-25 MG tablet TAKE 1 TABLET BY MOUTH DAILY   No facility-administered encounter medications on file as of 04/15/2021.    Lab Results  Component Value Date/Time   HGBA1C 6.2 10/08/2015 11:03 AM   HGBA1C 6.0 04/07/2015 11:54 AM     BP Readings from Last 3 Encounters:  03/22/21 130/70  01/04/21 110/70  10/13/20 110/70    Patient contacted to review initial questions prior to visit with Al Corpus.  Have you seen any other providers since your last visit with PCP? Yes 03/22/21-Cardiology-no medication changes  Any changes in your medications or health? No  Any side effects from any medications? No  Do you  have an symptoms or problems not managed by your medications? No  Any concerns about your health right now? No  Has your provider asked that you check blood pressure, blood sugar, or follow special diet at home? No  The patient states he does not have a BP monitor.  Do you get any type of exercise on a regular basis?  Yes works with [employer] and the patient is also a Visual merchandiser.  Can you think of a goal you would like to reach for your health? No  Do you have any problems getting your medications? No [facility]    Is there anything that you would like to discuss during the appointment? No   Spoke with patient and reminded them to have all medications, supplements and any blood glucose and blood pressure readings available for review with pharmacist, at their telephone visit on 04/21/21 at 2:00pm  The patient states  the appointment will be better for him if Mardella Layman can call him at 1:30pm due to he will have his phone on him at that time . Marland Kitchen   Star Rating Drugs:  Medication:   Last Fill: [facility] Supply Valsartan-HCTZ 320-25mg  04/02/21 90  Care Gaps: Annual wellness visit in last year? Yes Most Recent BP reading:130/70  82-P  03/22/21  Al Corpus RPH,CPP notified  Burt Knack, [facility] Clincal Pharmacy Assistant [phone]  Total time spent for month CPA: 40 min.

## 2021-04-21 ENCOUNTER — Ambulatory Visit (INDEPENDENT_AMBULATORY_CARE_PROVIDER_SITE_OTHER): Payer: Medicare Other | Admitting: Pharmacist

## 2021-04-21 ENCOUNTER — Other Ambulatory Visit: Payer: Self-pay

## 2021-04-21 DIAGNOSIS — I10 Essential (primary) hypertension: Secondary | ICD-10-CM

## 2021-04-21 DIAGNOSIS — F1120 Opioid dependence, uncomplicated: Secondary | ICD-10-CM

## 2021-04-21 DIAGNOSIS — F39 Unspecified mood [affective] disorder: Secondary | ICD-10-CM

## 2021-04-21 DIAGNOSIS — E785 Hyperlipidemia, unspecified: Secondary | ICD-10-CM

## 2021-04-21 DIAGNOSIS — K219 Gastro-esophageal reflux disease without esophagitis: Secondary | ICD-10-CM

## 2021-04-21 DIAGNOSIS — M48061 Spinal stenosis, lumbar region without neurogenic claudication: Secondary | ICD-10-CM

## 2021-04-21 NOTE — Patient Instructions (Addendum)
Visit Information  Phone number for Pharmacist: [phone]  Thank you for meeting with me to discuss your medications! I look forward to working with you to achieve your health care goals. Below is a summary of what we talked about during the visit:   Goals Addressed             This Visit's Progress    Manage My Medicine       Timeframe:  Long-Range Goal Priority:  High Start Date:        04/21/21                     Expected End Date:       04/21/22                Follow Up Date May 2023   - call for medicine refill 2 or 3 days before it runs out - call if I am sick and can't take my medicine - learn to read medicine labels  -consider d/c apple cider vinegar, cinnamon, garlic, ginkgo biloba, zinc -get flu vaccine as soon as possible, ask pharmacist about Shingrix   Why is this important?   These steps will help you keep on track with your medicines.   Notes:         Care Plan : CCM Pharmacy Care Plan  Updates made by Kathyrn Sheriff, RPH since 04/21/2021 12:00 AM     Problem: Hypertension, Hyperlipidemia, GERD, Depression, Anxiety, and Osteoarthritis   Priority: High     Long-Range Goal: Disease management   Start Date: 04/21/2021  Expected End Date: 04/21/2022  This Visit's Progress: On track  Priority: High  Note:   Current Barriers:  Unable to independently monitor therapeutic efficacy  Pharmacist Clinical Goal(s):  Patient will achieve adherence to monitoring guidelines and medication adherence to achieve therapeutic efficacy through collaboration with PharmD and provider.   Interventions: 1:1 collaboration with [name], MD regarding development and update of comprehensive plan of care as evidenced by provider attestation and co-signature Inter-disciplinary care team collaboration (see longitudinal plan of care) Comprehensive medication review performed; medication list updated in electronic medical record  Hypertension (BP goal  <130/80) -Controlled - BP is at goal in office; pt does not check at home; he endorses compliance with medications as prescribed  -Hx OSA on CPAP -Current treatment: Amlodipine 10 mg daily AM Valsartan-HCTZ 320-25 mg daily AM -Denies hypotensive/hypertensive symptoms -Educated on BP goals and benefits of medications for prevention of heart attack, stroke and kidney damage; -Counseled to monitor BP at home periodically -Recommended to continue current medication  Hyperlipidemia: (LDL goal < 100) -Controlled - LDL is at goal; pt has not tolerated statins previously; ASCVD risk is high -05/2018 CT coronary calcium score 0; no evidence CAD -Current treatment: Ezetimibe 10 mg daily Niacin 500 mg BID Aspirin 81 mg daily -Medications previously tried: simvastatin, atorvastatin, rosuvastatin  -Educated on Cholesterol goals;  -Recommended to continue current medication  Depression/Anxiety (Goal: manage symptoms) -Controlled - pt reports he has stopped sertraline previously and "felt funny" after 2-3 days so he restarted it; he reports good control with current dose and does not wish to increase dose -Current treatment: Sertraline 25 mg daily Alprazolam 0.5 mg TID prn - takes once daily -Medications previously tried/failed: none -PHQ2: 0 (03/2019) -Connected with PCP for mental health support -Educated on drug interaction between alprazolam and hydrocodone that increases risk for respiratory depression -Recommended to continue current medication  Testosterone deficiency (Goal:  manage symptoms) -Controlled  -Current treatment  Testosterone cypionate 200 mg/mL - inject 1 mL q14 days (Sat) Sildenafil 100 mg PRN -Counseled on benefits/risks of testosterone therapy; pt endorses benefits outweigh risks at this time -Recommended to continue current medication  Chronic pain (Goal: manage pain) -Controlled - pt takes hydrocodone TID every day; he feels he needs it to function -osteoarthritis  of knee (s/p TKA); lumbar spinal stenosis; -Current treatment  Hydrocodone-APAP 5-325 MG TID  Turmeric 500 mg daily -Counseled on interaction between hydrocodone and alprazolam, higher risk of respiratory depression and oversedation; pt denies these symptoms -Advised to increase turmeric to BID -Recommended to continue current medication  GERD (Goal: manage symptoms) -Controlled - pt reports symptoms worsen if he misses a dose of medication -Current treatment  Omeprazole 20 mg daily -Recommended to continue current medication  Health Maintenance -Vaccine gaps: Shingrix, covid booster, Flu, TDAP -Advised pt to get Flu vaccine ASAP; advised he ask his pharmacist about Shingrix -Pt reports he gets handicap placard through his back doctor, who has recently retired; he wonders if PCP can take over signing for handicap placard - will forward request to PCP -Current therapy:  Apple cider vinegar 480 mg daily Cinnamon 200 mg daily Garlic 2000 mg BID Ginkgo biloba 120 mg daily Multivitamin Systane eye drops Triamcinolone 0.1% Cetirizine 10 mg daily Fluticasone nasal spray Loperamide 2 mg PRN +Vitamin C 500 mg daily +fish oil 1200 mg daily +zinc 50 mg BID +Vitamin D 50 mcg daily -Educated on Herbal supplement research is limited and benefits usually cannot be proven; Cost vs benefit of each product must be carefully weighed by individual consumer - advised pt to specifically look into apple cider vinegar, cinnamon, garlic, ginkgo biloba, zinc to possibly discontinue  Patient Goals/Self-Care Activities Patient will:  - take medications as prescribed as evidenced by patient report and record review focus on medication adherence by routine -Get Flu vaccine as soon as possible. Ask pharmacist about Shingrix vaccine. -Consider stopping apple cider vinegar, cinnamon, garlic, ginkgo biloba, zinc due to lack of benefit      Dakota Ellison was given information about Chronic Care Management  services today including:  CCM service includes personalized support from designated clinical staff supervised by his physician, including individualized plan of care and coordination with other care providers 24/7 contact phone numbers for assistance for urgent and routine care needs. Standard insurance, coinsurance, copays and deductibles apply for chronic care management only during months in which we provide at least 20 minutes of these services. Most insurances cover these services at 100%, however patients may be responsible for any copay, coinsurance and/or deductible if applicable. This service may help you avoid the need for more expensive face-to-face services. Only one practitioner may furnish and bill the service in a calendar month. The patient may stop CCM services at any time (effective at the end of the month) by phone call to the office staff.  Patient agreed to services and verbal consent obtained.   Patient verbalizes understanding of instructions provided today and agrees to view in MyChart.  Telephone follow up appointment with pharmacy team member scheduled for: 6 months  Dakota Ellison, PharmD, [facility] Clinical Pharmacist [facility] [phone]

## 2021-04-21 NOTE — Progress Notes (Signed)
Chronic Care Management Pharmacy Note  04/21/2021 Name:  Dakota Ellison MRN:  [id number] DOB:  1951/04/10  Summary: -Pt endorses compliance with medications as prescribed -Discussed costs/benefits of herbal supplements, most are not supported by research but are generally harmless -Pt reports his back doctor signs for his Handicap placard but he has retired, he requests PCP take over signing for Handicap placard  Recommendations/Changes made from today's visit: -Pt to consider d/c apple cider vinegar, cinnamon, garlic, ginkgo biloba, zinc -Advised to get flu vaccine as soon as possible, ask pharmacist about Shingrix -Coordinate with PCP for Handicap placard renewal   Subjective: Dakota Ellison is an 70 y.o. year old male who is a primary patient of Dakota Carbon, MD.  The CCM team was consulted for assistance with disease management and care coordination needs.    Engaged with patient by telephone for initial visit in response to provider referral for pharmacy case management and/or care coordination services.   Consent to Services:  The patient was given the following information about Chronic Care Management services today, agreed to services, and gave verbal consent: 1. CCM service includes personalized support from designated clinical staff supervised by the primary care provider, including individualized plan of care and coordination with other care providers 2. 24/7 contact phone numbers for assistance for urgent and routine care needs. 3. Service will only be billed when office clinical staff spend 20 minutes or more in a month to coordinate care. 4. Only one practitioner may furnish and bill the service in a calendar month. 5.The patient may stop CCM services at any time (effective at the end of the month) by phone call to the office staff. 6. The patient will be responsible for cost sharing (co-pay) of up to 20% of the service fee (after annual deductible is met). Patient agreed  to services and consent obtained.  Patient Care Team: Dakota Carbon, MD as PCP - General (Internal Medicine) Minna Merritts, MD as PCP - Cardiology (Cardiology) Minna Merritts, MD as Consulting Physician (Cardiology) Charlton Haws, [facility] as Pharmacist (Pharmacist)  Recent office visits: 03/04/21-Family Medicine-Dr.Tower-Telemedicine-Patient presented for congestion,nasal drip,cough.Recommend covid test, tylenol as needed mucinex DM for cough, increase fluids and update in 1 week.  01/04/21-PCP-Dr.Letvak-Patient presented for chronic pain and medication follow up, no medication changes. Follow up 4 months  Recent consult visits: 03/22/21-Cardiology-Dr.Gollan-Patient presented for follow up hypertension. CT calcium score 0. No medication changes follow up 12 months  03/11/21-[facility]-Patient presented for steroid back injection (Kenalog) 10/26/20-[facility]-Patient presented for steroid back injection(Kenalog)  Hospital visits: None in previous 6 months   Objective:  Lab Results  Component Value Date   CREATININE 1.06 04/24/2020   BUN 17 04/24/2020   GFR 72.07 04/24/2020   GFRNONAA >60 02/04/2019   GFRAA >60 02/04/2019   NA 138 04/24/2020   K 3.9 04/24/2020   CALCIUM 9.8 04/24/2020   CO2 30 04/24/2020   GLUCOSE 107 (H) 04/24/2020    Lab Results  Component Value Date/Time   HGBA1C 6.2 10/08/2015 11:03 AM   HGBA1C 6.0 04/07/2015 11:54 AM   GFR 72.07 04/24/2020 01:13 PM   GFR 75.10 10/09/2017 10:42 AM    Last diabetic Eye exam: No results found for: HMDIABEYEEXA  Last diabetic Foot exam: No results found for: HMDIABFOOTEX   Lab Results  Component Value Date   CHOL 131 04/24/2020   HDL 29.80 (L) 04/24/2020   LDLCALC 62 04/24/2020   LDLDIRECT 88.0 10/09/2017  TRIG 192.0 (H) 04/24/2020   CHOLHDL 4 04/24/2020    Hepatic Function Latest Ref Rng & Units 04/24/2020 02/04/2019 12/13/2018  Total Protein 6.0 - 8.3 g/dL 7.4 7.9  8.4(H)  Albumin 3.5 - 5.2 g/dL 4.3 4.3 4.6  AST 0 - 37 U/L 33 31 28  ALT 0 - 53 U/L 36 29 28  Alk Phosphatase 39 - 117 U/L 74 99 81  Total Bilirubin 0.2 - 1.2 mg/dL 0.6 0.8 0.8  Bilirubin, Direct 0.0 - 0.3 mg/dL - - -    Lab Results  Component Value Date/Time   TSH 1.85 10/09/2017 10:42 AM   TSH 1.32 10/10/2016 12:12 PM    CBC Latest Ref Rng & Units 04/24/2020 02/04/2019 12/18/2018  WBC 4.0 - 10.5 K/uL 8.4 5.9 17.3(H)  Hemoglobin 13.0 - 17.0 g/dL 15.9 15.6 13.1  Hematocrit 39.0 - 52.0 % 47.0 45.7 40.6  Platelets 150.0 - 400.0 K/uL 274.0 201 232    Lab Results  Component Value Date/Time   VD25OH 43.29 10/10/2016 12:12 PM    Clinical ASCVD: No  The 10-year ASCVD risk score (Arnett DK, et al., 2019) is: 35.5%   Values used to calculate the score:     Age: 70 years     Sex: Male     Is Non-Hispanic African American: No     Diabetic: Yes     Tobacco smoker: No     Systolic Blood Pressure: 559 mmHg     Is BP treated: Yes     HDL Cholesterol: 29.8 mg/dL     Total Cholesterol: 131 mg/dL    Depression screen Dakota Ellison 2/9 03/27/2019 10/04/2013  Decreased Interest 0 0  Down, Depressed, Hopeless 0 0  PHQ - 2 Score 0 0  Some recent data might be hidden     Social History   Tobacco Use  Smoking Status Never  Smokeless Tobacco Never  Tobacco Comments   CIGARS ONLY--QUIT 2013   BP Readings from Last 3 Encounters:  03/22/21 130/70  01/04/21 110/70  10/13/20 110/70   Pulse Readings from Last 3 Encounters:  03/22/21 82  01/04/21 76  10/13/20 80   Wt Readings from Last 3 Encounters:  03/22/21 289 lb 8 oz (131.3 kg)  03/04/21 275 lb (124.7 kg)  01/04/21 290 lb (131.5 kg)   BMI Readings from Last 3 Encounters:  03/22/21 39.26 kg/m  03/04/21 36.28 kg/m  01/04/21 38.26 kg/m    Assessment/Interventions: Review of patient past medical history, allergies, medications, health status, including review of consultants reports, laboratory and other test data, was performed as  part of comprehensive evaluation and provision of chronic care management services.   SDOH:  (Social Determinants of Health) assessments and interventions performed: Yes  SDOH Screenings   Alcohol Screen: Not on file  Depression (PHQ2-9): Not on file  Financial Resource Strain: Not on file  Food Insecurity: Not on file  Housing: Not on file  Physical Activity: Not on file  Social Connections: Not on file  Stress: Not on file  Tobacco Use: Low Risk    Smoking Tobacco Use: Never   Smokeless Tobacco Use: Never   Passive Exposure: Not on file  Transportation Needs: Not on file    CCM Care Plan  Allergies  Allergen Reactions   Atorvastatin Other (See Comments)    Muscle weakness in his legs   Crestor [Rosuvastatin] Other (See Comments)    Severe muscle cramping   Zocor [Simvastatin] Other (See Comments)    Muscle weakness in his  legs    Medications Reviewed Today     Reviewed by Charlton Haws, [facility] (Pharmacist) on 04/21/21 at 1433  Med List Status: <None>   Medication Order Taking? Sig Documenting Provider Last Dose Status Informant  ALPRAZolam (XANAX) 0.5 MG tablet [id number] Yes TAKE 1 TABLET BY MOUTH THREE TIMES DAILYAS NEEDED Dakota Carbon, MD Taking Active   amLODipine (NORVASC) 10 MG tablet [id number] Yes TAKE 1 TABLET BY MOUTH DAILY Dakota Carbon, MD Taking Active   Apple Cider Vinegar 500 MG TABS [id number] Yes Take 500 mg by mouth daily.  [provider] Taking Active Self  aspirin EC 81 MG tablet [id number] Yes Take 81 mg by mouth daily. Swallow whole. [provider] Taking Active   cetirizine (ZYRTEC) 10 MG tablet [id number] Yes Take 10 mg by mouth daily. [provider] Taking Active Self  Cholecalciferol (VITAMIN D) 50 MCG (2000 UT) CAPS [id number] Yes Take 1 capsule by mouth daily. [provider] Taking Active   CINNAMON PO [id number] Yes Take 200 mg by mouth daily. [provider] Taking Active Self   ezetimibe (ZETIA) 10 MG tablet [id number] Yes TAKE 1 TABLET BY MOUTH DAILY Dakota Carbon, MD Taking Active   fluticasone (FLONASE) 50 MCG/ACT nasal spray [id number] Yes Place 2 sprays into both nostrils in the morning and at bedtime. Raoul Pitch, Renee A, DO Taking Active   Garlic 2778 MG CAPS [id number] Yes Take 2,000 mg by mouth 2 (two) times daily.  [provider] Taking Active Self  Ginkgo Biloba 120 MG CAPS [id number] Yes Take 120 mg by mouth daily. [provider] Taking Active Self  HYDROcodone-acetaminophen (NORCO/VICODIN) 5-325 MG tablet [id number] Yes TAKE 1 TABLET BY MOUTH THREE TIMES DAILYAS NEEDED FOR MODERATE PAIN Dakota Carbon, MD Taking Active   loperamide (IMODIUM A-D) 2 MG tablet [id number] Yes Take 2 tablets (4 mg total) by mouth 4 (four) times daily as needed for diarrhea or loose stools. Carrie Mew, MD Taking Active   Multiple Vitamin (MULTIVITAMIN WITH MINERALS) TABS tablet [id number] Yes Take 1 tablet by mouth daily. [provider] Taking Active Self  niacin 500 MG CR capsule [id number] Yes Take 500 mg by mouth 2 (two) times daily. [provider] Taking Active Self  Omega-3 Fatty Acids (FISH OIL) 1200 MG CPDR [id number] Yes Take 1,200 mg by mouth daily. [provider] Taking Active   omeprazole (PRILOSEC) 20 MG capsule [id number] Yes TAKE 1 CAPSULE BY MOUTH DAILY Dakota Carbon, MD Taking Active   sertraline (ZOLOFT) 25 MG tablet [id number] Yes TAKE 1 TABLET BY MOUTH DAILY Dakota Carbon, MD Taking Active   sildenafil (VIAGRA) 100 MG tablet [id number] Yes Take as directed  Patient taking differently: Take 100 mg by mouth daily as needed for erectile dysfunction. Take as directed   Noralee Space, MD Taking Active Self  SYSTANE ULTRA 0.4-0.3 % SOLN [id number] Yes Place 1 drop into both eyes 4 (four) times daily as needed (dry/irritated eyes.). [provider] Taking Active Self  testosterone cypionate (DEPOTESTOSTERONE  CYPIONATE) 200 MG/ML injection [id number] Yes Inject 200 mg into the muscle every 14 (fourteen) days. Saturdays [provider] Taking Active Self  triamcinolone (KENALOG) 0.1 % [id number] Yes Apply 1 application topically 2 (two) times daily as needed. Dakota Carbon, MD Taking Active   Turmeric (QC TUMERIC COMPLEX PO) [id number] Yes Take 1 tablet by mouth daily. [provider] Taking Active   valsartan-hydrochlorothiazide (DIOVAN-HCT) 320-25 MG tablet [id number] Yes TAKE 1  TABLET BY MOUTH DAILY Dakota Carbon, MD Taking Active   vitamin C (ASCORBIC ACID) 500 MG tablet [id number] Yes Take 500 mg by mouth daily. [provider] Taking Active   zinc gluconate 50 MG tablet [id number] Yes Take 50 mg by mouth daily. [provider] Taking Active             Patient Active Problem List   Diagnosis Date Noted   Viral URI with cough 03/04/2021   Cyst of left kidney 12/16/2019   Advance directive discussed with patient 03/27/2019   Osteoarthritis of right knee 12/17/2018   Chronic narcotic dependence ([place]) 10/09/2018   Low back pain 04/11/2015   Wellness examination 10/07/2014   Mood disorder ([place]) 10/07/2014   Impaired glucose tolerance 10/04/2013   Inguinal hernia unilateral, non-recurrent, left 10/17/2012   Bladder neck obstruction 10/04/2010   Obstructive sleep apnea on CPAP 10/06/2008   RECTAL FISSURE 05/14/2008   Testosterone deficiency 10/02/2007   Hyperlipidemia 07/13/2007   Morbid obesity ([place]) 07/13/2007   ERECTILE DYSFUNCTION 07/13/2007   Essential hypertension 07/13/2007   Lumbar spinal stenosis 07/13/2007   Asthma 07/12/2007   GERD 07/12/2007   OA (osteoarthritis) of knee 07/12/2007    Immunization History  Administered Date(s) Administered   Fluad Quad(high Dose 65+) 02/28/2019   Influenza Split 03/25/2011, 03/23/2012, 03/18/2013   Influenza Whole 03/06/2009, 03/08/2010   Influenza, High Dose Seasonal PF 04/09/2018    Influenza,inj,Quad PF,6+ Mos 03/06/2014, 03/28/2016   Influenza-Unspecified 03/21/2015, 03/09/2017, 03/20/2020   PFIZER(Purple Top)SARS-COV-2 Vaccination 07/13/2019, 08/05/2019, 05/20/2020   Pneumococcal Conjugate-13 10/08/2015   Pneumococcal Polysaccharide-23 04/17/2017   Tdap 03/07/2011   Zoster, Live 10/23/2015    Conditions to be addressed/monitored:  Hypertension, Hyperlipidemia, GERD, Depression, Anxiety, and Osteoarthritis  Care Plan : [place]  Updates made by Charlton Haws, [place] since 04/21/2021 12:00 AM     Problem: Hypertension, Hyperlipidemia, GERD, Depression, Anxiety, and Osteoarthritis   Priority: High     Long-Range Goal: Disease management   Start Date: 04/21/2021  Expected End Date: 04/21/2022  This Visit's Progress: On track  Priority: High  Note:   Current Barriers:  Unable to independently monitor therapeutic efficacy  Pharmacist Clinical Goal(s):  Patient will achieve adherence to monitoring guidelines and medication adherence to achieve therapeutic efficacy through collaboration with PharmD and provider.   Interventions: 1:1 collaboration with Dakota Carbon, MD regarding development and update of comprehensive plan of care as evidenced by provider attestation and co-signature Inter-disciplinary care team collaboration (see longitudinal plan of care) Comprehensive medication review performed; medication list updated in electronic medical record  Hypertension (BP goal <130/80) -Controlled - BP is at goal in office; pt does not check at home; he endorses compliance with medications as prescribed  -Hx OSA on CPAP -Current treatment: Amlodipine 10 mg daily AM Valsartan-HCTZ 320-25 mg daily AM -Denies hypotensive/hypertensive symptoms -Educated on BP goals and benefits of medications for prevention of heart attack, stroke and kidney damage; -Counseled to monitor BP at home periodically -Recommended to continue current  medication  Hyperlipidemia: (LDL goal < 100) -Controlled - LDL is at goal; pt has not tolerated statins previously; ASCVD risk is high -05/2018 CT coronary calcium score 0; no evidence CAD -Current treatment: Ezetimibe 10 mg daily Niacin 500 mg BID Aspirin 81 mg daily -Medications previously tried: simvastatin, atorvastatin, rosuvastatin  -Educated on Cholesterol goals;  -Recommended to continue current medication  Depression/Anxiety (Goal: manage symptoms) -Controlled - pt reports he has stopped sertraline previously and "felt funny"  after 2-3 days so he restarted it; he reports good control with current dose and does not wish to increase dose -Current treatment: Sertraline 25 mg daily Alprazolam 0.5 mg TID prn - takes once daily -Medications previously tried/failed: none -PHQ2: 0 (03/2019) -Connected with PCP for mental health support -Educated on drug interaction between alprazolam and hydrocodone that increases risk for respiratory depression -Recommended to continue current medication  Testosterone deficiency (Goal: manage symptoms) -Controlled  -Current treatment  Testosterone cypionate 200 mg/mL - inject 1 mL q14 days (Sat) Sildenafil 100 mg PRN -Counseled on benefits/risks of testosterone therapy; pt endorses benefits outweigh risks at this time -Recommended to continue current medication  Chronic pain (Goal: manage pain) -Controlled - pt takes hydrocodone TID every day; he feels he needs it to function -osteoarthritis of knee (s/p TKA); lumbar spinal stenosis; -Current treatment  Hydrocodone-APAP 5-325 MG TID  Turmeric 500 mg daily -Counseled on interaction between hydrocodone and alprazolam, higher risk of respiratory depression and oversedation; pt denies these symptoms -Advised to increase turmeric to BID -Recommended to continue current medication  GERD (Goal: manage symptoms) -Controlled - pt reports symptoms worsen if he misses a dose of medication -Current  treatment  Omeprazole 20 mg daily -Recommended to continue current medication  Health Maintenance -Vaccine gaps: Shingrix, covid booster, Flu, TDAP -Advised pt to get Flu vaccine ASAP; advised he ask his pharmacist about Shingrix -Pt reports he gets handicap placard through his back doctor, who has recently retired; he wonders if PCP can take over signing for handicap placard - will forward request to PCP -Current therapy:  Apple cider vinegar 480 mg daily Cinnamon 830 mg daily Garlic 9407 mg BID Ginkgo biloba 120 mg daily Multivitamin Systane eye drops Triamcinolone 0.1% Cetirizine 10 mg daily Fluticasone nasal spray Loperamide 2 mg PRN +Vitamin C 500 mg daily +fish oil 1200 mg daily +zinc 50 mg BID +Vitamin D 50 mcg daily -Educated on Herbal supplement research is limited and benefits usually cannot be proven; Cost vs benefit of each product must be carefully weighed by individual consumer - advised pt to specifically look into apple cider vinegar, cinnamon, garlic, ginkgo biloba, zinc to possibly discontinue  Patient Goals/Self-Care Activities Patient will:  - take medications as prescribed as evidenced by patient report and record review focus on medication adherence by routine -Get Flu vaccine as soon as possible. Ask pharmacist about Shingrix vaccine. -Consider stopping apple cider vinegar, cinnamon, garlic, ginkgo biloba, zinc due to lack of benefit      Medication Assistance: None required.  Patient affirms current coverage meets needs.  Compliance/Adherence/Medication fill history: Care Gaps: Hemoglobin A1c (due 04/09/16) Eye exam  Foot exam (due 10/05/14)  Star-Rating Drugs: Valsartan-HCTZ - LF 04/02/21 x 90 ds  Patient's preferred pharmacy is:  [facility] [place] [address] Phone: [phone] Fax: [phone]  Uses pill box? Yes Pt endorses 100%  compliance  We discussed: Current pharmacy is preferred with insurance plan and patient is satisfied with pharmacy services Patient decided to: Continue current medication management strategy  Care Plan and Follow Up Patient Decision:  Patient agrees to Care Plan and Follow-up.  Plan: Telephone follow up appointment with care management team member scheduled for:  6 months  Charlene Brooke, PharmD, BCACP Clinical Pharmacist [facility] [phone]

## 2021-04-26 ENCOUNTER — Other Ambulatory Visit: Payer: Self-pay | Admitting: Internal Medicine

## 2021-04-26 NOTE — Telephone Encounter (Signed)
Name of Medication: Hydrocodone Name of Pharmacy: [facility] Last Fill or Written Date and Quantity: 03-25-21 #90 Last Office Visit and Type: 01-04-21 Next Office Visit and Type: 05-11-21 Last Controlled Substance Agreement Date: 01-04-21 Last UDS: 01-04-21

## 2021-05-03 ENCOUNTER — Telehealth: Payer: Self-pay

## 2021-05-03 NOTE — Telephone Encounter (Signed)
[name], MD to Me  Erby Pian, CMA  Eual Fines, CMA     1:52 PM  Thanks for trying.  Please try again later or tomorrow    Tried to reach pt again but not successful; sending note to [facility] and triage.

## 2021-05-03 NOTE — Telephone Encounter (Signed)
Per DPR left v/m requesting pt to call [phone] to get update on how pt is doing.unable to reach pt or pts son (DPR signed). Sending note to DR Alphonsus Sias and Carollee Herter CMA.

## 2021-05-03 NOTE — Telephone Encounter (Signed)
[facility] Night - Client TELEPHONE ADVICE RECORD AccessNurse Patient Name: Dakota Ellison Gender: Male DOB: [birth year] Age: 70 Y 7 M 11 D Return Phone Number: [phone] (Primary) Address: City/ State/ Zip: [place] [address] Client [facility] Night - Client Client Site  [facility] - Night Provider Tillman Abide- MD Contact Type Call Who Is Calling Patient / Member / Family / Caregiver Call Type Triage / Clinical Relationship To Patient Self Return Phone Number [phone] (Primary) Chief Complaint Vomiting Reason for Call Symptomatic / Request for Health Information Initial Comment Caller says that he needs a med called in. Since yesterday at 930 AM he started feeling awful and vomited, and did on and off all day, and has had watery diarrhea and fever. Temp is 101.1. PT urinated within 8 hours. Translation No Nurse Assessment Nurse: Valentina Lucks, RN, Tempie Hoist Date/Time [name] Time): 04/30/2021 9:10:40 AM Confirm and document reason for call. If symptomatic, describe symptoms. ---Patient was vomiting yesterday. Today patient has fever and diarrhea. Patient's temp is 100.5 tympanic. Patient is weak. Caller states no other sx. Does the patient have any new or worsening symptoms? ---Yes Will a triage be completed? ---Yes Related visit to physician within the last 2 weeks? ---No Does the PT have any chronic conditions? (i.e. diabetes, asthma, this includes High risk factors for pregnancy, etc.) ---No Is this a behavioral health or substance abuse call? ---No Guidelines Guideline Title Affirmed Question Affirmed Notes Nurse Date/Time (Eastern Time) Diarrhea [1] SEVERE diarrhea (e.g., 7 or more times / day more than normal) AND [2] age > 81 years Mat Carne 04/30/2021 9:12:52 AM Disp. Time [name] Time) Disposition Final User PLEASE NOTE: All timestamps contained within this  report are represented as [place] Standard Time. CONFIDENTIALTY NOTICE: This fax transmission is intended only for the addressee. It contains information that is legally privileged, confidential or otherwise protected from use or disclosure. If you are not the intended recipient, you are strictly prohibited from reviewing, disclosing, copying using or disseminating any of this information or taking any action in reliance on or regarding this information. If you have received this fax in error, please notify us immediately by telephone so that we can arrange for its return to [place]. Phone: [phone], Toll-Free: [phone], Fax: [phone] Page: 2 of 2 Call Id: [id number] 04/30/2021 9:17:00 AM See HCP within 4 Hours (or PCP triage) Jarome Lamas, RN, Tempie Hoist Caller Disagree/Comply Comply Caller Understands Yes PreDisposition Go to Urgent Care/Walk-In Clinic Care Advice Given Per Guideline SEE HCP (OR PCP TRIAGE) WITHIN 4 HOURS: * IF OFFICE WILL BE CLOSED AND NO PCP (PRIMARY CARE PROVIDER) SECOND-LEVEL TRIAGE: You need to be seen within the next 3 or 4 hours. A nearby Urgent Care Center [facility]) is often a good source of care. Another choice is to go to the ED. Go sooner if you become worse. CALL BACK IF: * You become worse CARE ADVICE given per Diarrhea (Adult) guideline. Referrals GO TO FACILITY UNDECIDE

## 2021-05-04 NOTE — Telephone Encounter (Signed)
I spoke with pt; pt did not go to UC on 04/30/21.today pt said he is better; fever stopped on 05/02/21; diarrhea and vomiting stopped over weekend;home covid test neg over weekend; still feels weak an some dry mouth but pt is drinking fluids and has appt to see Dr Alphonsus Sias already scheduled on 05/11/21. UC & ED precautions given and pt voiced understanding but pt said he does not need to go to ED for IV and will drink plenty of fluids.Pt said will be fasting for labs on 05/11/21 also. Sending note to Dr Alphonsus Sias as Lorain Childes.

## 2021-05-04 NOTE — Telephone Encounter (Signed)
I am glad that he is improved. Okay to wait for his regular visit next week

## 2021-05-05 DIAGNOSIS — F419 Anxiety disorder, unspecified: Secondary | ICD-10-CM | POA: Diagnosis not present

## 2021-05-05 DIAGNOSIS — E785 Hyperlipidemia, unspecified: Secondary | ICD-10-CM | POA: Diagnosis not present

## 2021-05-05 DIAGNOSIS — F32A Depression, unspecified: Secondary | ICD-10-CM

## 2021-05-05 DIAGNOSIS — I10 Essential (primary) hypertension: Secondary | ICD-10-CM

## 2021-05-11 ENCOUNTER — Other Ambulatory Visit: Payer: Self-pay

## 2021-05-11 ENCOUNTER — Ambulatory Visit (INDEPENDENT_AMBULATORY_CARE_PROVIDER_SITE_OTHER): Payer: Medicare Other | Admitting: Internal Medicine

## 2021-05-11 ENCOUNTER — Encounter: Payer: Self-pay | Admitting: Internal Medicine

## 2021-05-11 VITALS — BP 110/72 | HR 81 | Temp 98.1°F | Ht 70.5 in | Wt 289.0 lb

## 2021-05-11 DIAGNOSIS — F1120 Opioid dependence, uncomplicated: Secondary | ICD-10-CM | POA: Diagnosis not present

## 2021-05-11 DIAGNOSIS — M48061 Spinal stenosis, lumbar region without neurogenic claudication: Secondary | ICD-10-CM

## 2021-05-11 DIAGNOSIS — Z23 Encounter for immunization: Secondary | ICD-10-CM | POA: Diagnosis not present

## 2021-05-11 DIAGNOSIS — K219 Gastro-esophageal reflux disease without esophagitis: Secondary | ICD-10-CM

## 2021-05-11 DIAGNOSIS — F39 Unspecified mood [affective] disorder: Secondary | ICD-10-CM | POA: Diagnosis not present

## 2021-05-11 DIAGNOSIS — S61412A Laceration without foreign body of left hand, initial encounter: Secondary | ICD-10-CM | POA: Insufficient documentation

## 2021-05-11 DIAGNOSIS — G4733 Obstructive sleep apnea (adult) (pediatric): Secondary | ICD-10-CM

## 2021-05-11 DIAGNOSIS — Z0000 Encounter for general adult medical examination without abnormal findings: Secondary | ICD-10-CM

## 2021-05-11 DIAGNOSIS — I10 Essential (primary) hypertension: Secondary | ICD-10-CM | POA: Diagnosis not present

## 2021-05-11 DIAGNOSIS — Z9989 Dependence on other enabling machines and devices: Secondary | ICD-10-CM

## 2021-05-11 LAB — CBC
HCT: 46.6 % (ref 39.0–52.0)
Hemoglobin: 15.3 g/dL (ref 13.0–17.0)
MCHC: 32.8 g/dL (ref 30.0–36.0)
MCV: 86.6 fl (ref 78.0–100.0)
Platelets: 249 10*3/uL (ref 150.0–400.0)
RBC: 5.38 Mil/uL (ref 4.22–5.81)
RDW: 14.1 % (ref 11.5–15.5)
WBC: 8.5 10*3/uL (ref 4.0–10.5)

## 2021-05-11 LAB — LIPID PANEL
Cholesterol: 130 mg/dL (ref 0–200)
HDL: 35.2 mg/dL — ABNORMAL LOW (ref >39.00)
LDL Cholesterol: 64 mg/dL (ref 0–99)
NonHDL: 94.44
Total CHOL/HDL Ratio: 4
Triglycerides: 150 mg/dL — ABNORMAL HIGH (ref 0.0–149.0)
VLDL: 30 mg/dL (ref 0.0–40.0)

## 2021-05-11 LAB — COMPREHENSIVE METABOLIC PANEL
ALT: 39 U/L (ref 0–53)
AST: 29 U/L (ref 0–37)
Albumin: 4.2 g/dL (ref 3.5–5.2)
Alkaline Phosphatase: 72 U/L (ref 39–117)
BUN: 16 mg/dL (ref 6–23)
CO2: 27 mEq/L (ref 19–32)
Calcium: 9.5 mg/dL (ref 8.4–10.5)
Chloride: 105 mEq/L (ref 96–112)
Creatinine, Ser: 0.93 mg/dL (ref 0.40–1.50)
GFR: 83.7 mL/min (ref >60.00)
Glucose, Bld: 93 mg/dL (ref 70–99)
Potassium: 4.2 mEq/L (ref 3.5–5.1)
Sodium: 141 mEq/L (ref 135–145)
Total Bilirubin: 0.7 mg/dL (ref 0.2–1.2)
Total Protein: 6.7 g/dL (ref 6.0–8.3)

## 2021-05-11 NOTE — Assessment & Plan Note (Signed)
I have personally reviewed the Medicare Annual Wellness questionnaire and have noted 1. The patient's medical and social history 2. Their use of alcohol, tobacco or illicit drugs 3. Their current medications and supplements 4. The patient's functional ability including ADL's, fall risks, home safety risks and hearing or visual             impairment. 5. Diet and physical activities 6. Evidence for depression or mood disorders  The patients weight, height, BMI and visual acuity have been recorded in the chart I have made referrals, counseling and provided education to the patient based review of the above and I have provided the pt with a written personalized care plan for preventive services.  I have provided you with a copy of your personalized plan for preventive services. Please take the time to review along with your updated medication list.  Recommended bivalent COVID vaccine Flu vaccine today Probably done with colonoscopies---normal 2021 PSA per urology Discussed exercise shingrix when covered

## 2021-05-11 NOTE — Progress Notes (Signed)
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

## 2021-05-11 NOTE — Assessment & Plan Note (Signed)
Uses it every night--CPAP

## 2021-05-11 NOTE — Progress Notes (Signed)
Subjective:    Patient ID: Dakota Ellison, male    DOB: 10-Nov-1950, 70 y.o.   MRN: [id number]  HPI Here for Medicare wellness visit and follow up of chronic health conditions This visit occurred during the SARS-CoV-2 public health emergency.  Safety protocols were in place, including screening questions prior to the visit, additional usage of staff PPE, and extensive cleaning of exam room while observing appropriate contact time as indicated for disinfecting solutions.   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 [employer] No hospitalization or surgery in past year Vision is okay Hearing is fine No alcohol No tobacco 1 fall without injury Independent with instrumental ADLs No sig memory issues  Discussed Td--actually had skin injury in left hand 1-2 days ago Not infected  Ongoing back pain--is about the same Gets shots every 3 months--this helps Continues on the hydrocodone regularly  Gets anxious at times Uses the xanax every morning No depression on the low dose sertraline (didn't do well coming off it) No anhedonic  BMI stable at 40 Tries to eat healthy Still drinks sugared drinks--discussed stopping  No chest pain No SOB No dizziness or syncope No palpitations  Current Outpatient Medications on File Prior to Visit  Medication Sig Dispense Refill   ALPRAZolam (XANAX) 0.5 MG tablet TAKE 1 TABLET BY MOUTH THREE TIMES DAILYAS NEEDED 90 tablet 0   amLODipine (NORVASC) 10 MG tablet TAKE 1 TABLET BY MOUTH DAILY 90 tablet 3   Apple Cider Vinegar 500 MG TABS Take 500 mg by mouth daily.      aspirin EC 81 MG  tablet Take 81 mg by mouth daily. Swallow whole.     cetirizine (ZYRTEC) 10 MG tablet Take 10 mg by mouth daily.     Cholecalciferol (VITAMIN D) 50 MCG (2000 UT) CAPS Take 1 capsule by mouth daily.     CINNAMON PO Take 200 mg by mouth daily.     ezetimibe (ZETIA) 10 MG tablet TAKE 1 TABLET BY MOUTH DAILY 90 tablet 3   fluticasone (FLONASE) 50 MCG/ACT nasal spray Place 2 sprays into both nostrils in the morning and at bedtime. 16 g 0   Garlic 2000 MG CAPS Take 2,000 mg by mouth 2 (two) times daily.      Ginkgo Biloba 120 MG CAPS Take 120 mg by mouth daily.     HYDROcodone-acetaminophen (NORCO/VICODIN) 5-325 MG tablet TAKE 1 TABLET BY MOUTH THREE TIMES DAILYAS NEEDED FOR MODERATE PAIN 90 tablet 0   loperamide (IMODIUM A-D) 2 MG tablet Take 2 tablets (4 mg total) by mouth 4 (four) times daily as needed for diarrhea or loose stools. 30 tablet 0   Multiple Vitamin (MULTIVITAMIN WITH MINERALS) TABS tablet Take 1 tablet by mouth daily.     niacin 500 MG CR capsule Take 500 mg by mouth 2 (two) times daily.     Omega-3 Fatty Acids (FISH OIL) 1200 MG CPDR Take 1,200 mg by mouth daily.     omeprazole (PRILOSEC) 20 MG capsule TAKE 1 CAPSULE BY MOUTH DAILY 90 capsule 3   sertraline (ZOLOFT) 25 MG tablet TAKE 1 TABLET BY MOUTH DAILY 90 tablet 3   sildenafil (VIAGRA) 100 MG tablet Take as directed (Patient taking differently: Take 100 mg  by mouth daily as needed for erectile dysfunction. Take as directed) 10 tablet 5   SYSTANE ULTRA 0.4-0.3 % SOLN Place 1 drop into both eyes 4 (four) times daily as needed (dry/irritated eyes.).     testosterone cypionate (DEPOTESTOSTERONE CYPIONATE) 200 MG/ML injection Inject 200 mg into the muscle every 14 (fourteen) days. Saturdays     Turmeric (QC TUMERIC COMPLEX PO) Take 500 mg by mouth daily.     valsartan-hydrochlorothiazide (DIOVAN-HCT) 320-25 MG tablet TAKE 1 TABLET BY MOUTH DAILY 90 tablet 3   vitamin C (ASCORBIC ACID) 500 MG tablet Take 500 mg by mouth daily.      zinc gluconate 50 MG tablet Take 50 mg by mouth daily.     triamcinolone (KENALOG) 0.1 % Apply 1 application topically 2 (two) times daily as needed. (Patient not taking: Reported on 05/11/2021) 45 g 1   No current facility-administered medications on file prior to visit.    Allergies  Allergen Reactions   Atorvastatin Other (See Comments)    Muscle weakness in his legs   Crestor [Rosuvastatin] Other (See Comments)    Severe muscle cramping   Zocor [Simvastatin] Other (See Comments)    Muscle weakness in his legs    Past Medical History:  Diagnosis Date   Anxiety state 04/08/2014   denies   Complication of anesthesia    and trouble waking up   Degenerative disc disease    Dysthymia    denies   ED (erectile dysfunction)    GERD (gastroesophageal reflux disease)    History of MRSA infection    on lip from cut   Hyperlipemia    Hypertension    take BP med for small leak in heart to keep it small per pt.   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   Procedure: LEFT TOTAL KNEE ARTHROPLASTY;  Surgeon: [name], MD;  Location: [facility];  Service: Orthopedics;  Laterality: Left;    TOTAL KNEE ARTHROPLASTY Right 12/17/2018   Procedure: TOTAL KNEE ARTHROPLASTY;  Surgeon: [name], MD;  Location: [facility];  Service: Orthopedics;  Laterality: Right;     Family History  Problem Relation Age of Onset   Pneumonia Brother     Social History    Socioeconomic History   Marital status: Single    Spouse name: Not on file   Number of children: 1   Years of education: Not on file   Highest education level: Not on file  Occupational History   Occupation: long distance driver    Comment: Retired   Occupation: [employer] part time    Comment: Engineer, materials  Tobacco Use   Smoking status: Never   Smokeless tobacco: Never   Tobacco comments:    CIGARS ONLY--QUIT 2013  Vaping Use   Vaping Use: Never used  Substance and Sexual Activity   Alcohol use: Yes    Alcohol/week: 0.0 standard drinks    Comment: rare//occassional   Drug use: No   Sexual activity: Yes  Other Topics Concern   Not on file  Social History Narrative   Has living will   Son is health care POA   Would accept resuscitation attempts   Likely wouldn't want extended tube feeds if cognitively unaware   Social Determinants of Health   Financial Resource Strain: Not on file  Food Insecurity: Not on file  Transportation Needs: Not on file  Physical Activity: Not on file  Stress: Not on file  Social Connections: Not on file  Intimate Partner Violence: Not on file   Review of Systems Appetite is good Sleeps well with CPAP nightly Teeth are okay---sees dentist No suspicious skin lesions Wears seat belt No heartburn on omeprazole daily. No dysphagia Bowels move fine--no blood    Objective:   Physical Exam Constitutional:      Appearance: Normal appearance.  HENT:     Mouth/Throat:     Comments: No lesions Eyes:     Conjunctiva/sclera: Conjunctivae normal.     Pupils: Pupils are equal, round, and reactive to light.  Cardiovascular:     Rate and Rhythm: Normal rate and regular rhythm.     Pulses: Normal pulses.     Heart sounds: No murmur heard.   No gallop.  Pulmonary:     Effort: Pulmonary effort is normal.     Breath sounds: Normal breath sounds. No wheezing or rales.  Abdominal:     Palpations: Abdomen is soft.      Tenderness: There is no abdominal tenderness.  Musculoskeletal:     Cervical back: Neck supple.     Right lower leg: No edema.     Left lower leg: No edema.  Lymphadenopathy:     Cervical: No cervical adenopathy.  Skin:    Findings: No rash.     Comments: Small stellate granulating laceration on dorsum of left hand  Neurological:     Mental Status: He is alert and oriented to person, place, and time.     Comments: President---"Biden, Trump, Obama" [id number] H-[id number] Recall 3/3  Psychiatric:        Mood and Affect: Mood normal.        Behavior: Behavior normal.           Assessment & Plan:

## 2021-05-11 NOTE — Assessment & Plan Note (Signed)
PDMP reviewed No concerns 

## 2021-05-11 NOTE — Assessment & Plan Note (Signed)
Quiet on omeprazole 

## 2021-05-11 NOTE — Assessment & Plan Note (Signed)
Small Not infected Will update TD

## 2021-05-11 NOTE — Assessment & Plan Note (Signed)
With chronic pain Injections and hydrocodone keep him functional

## 2021-05-11 NOTE — Assessment & Plan Note (Signed)
BP Readings from Last 3 Encounters:  05/11/21 110/72  03/22/21 130/70  01/04/21 110/70   Good control on valsartan HCTZ

## 2021-05-11 NOTE — Addendum Note (Signed)
Addended by: Eual Fines on: 05/11/2021 12:41 PM   Modules accepted: Orders

## 2021-05-11 NOTE — Assessment & Plan Note (Signed)
Some anxiety --takes xanax every morning Dysthymia controlled with low dose sertraline

## 2021-05-25 ENCOUNTER — Other Ambulatory Visit: Payer: Self-pay | Admitting: Internal Medicine

## 2021-05-25 NOTE — Telephone Encounter (Signed)
Name of Medication: Hydrocodone Name of Pharmacy: [facility] Last Fill or Written Date and Quantity: 04-26-21 #90 Last Office Visit and Type: 05-11-21 Next Office Visit and Type: 08-16-21 Last Controlled Substance Agreement Date: 01-04-21 Last UDS: 01-04-21

## 2021-05-26 ENCOUNTER — Other Ambulatory Visit: Payer: Self-pay

## 2021-05-26 ENCOUNTER — Telehealth (INDEPENDENT_AMBULATORY_CARE_PROVIDER_SITE_OTHER): Payer: Medicare Other | Admitting: Family

## 2021-05-26 ENCOUNTER — Encounter: Payer: Self-pay | Admitting: Family

## 2021-05-26 VITALS — Ht 70.5 in | Wt 290.0 lb

## 2021-05-26 DIAGNOSIS — J069 Acute upper respiratory infection, unspecified: Secondary | ICD-10-CM

## 2021-05-26 MED ORDER — BENZONATATE 100 MG PO CAPS: 100.0000 mg | ORAL_CAPSULE | Freq: Three times a day (TID) | ORAL | 0 refills | Status: DC | PRN

## 2021-05-26 MED ORDER — AZITHROMYCIN 250 MG PO TABS
ORAL_TABLET | ORAL | 0 refills | Status: AC
Start: 2021-05-26 — End: 2021-05-31

## 2021-05-26 NOTE — Patient Instructions (Addendum)
Antibiotic sent to preferred pharmacy Recommend over the counter mucinex without the DM.   Please increase oral fluids, steamy hot shower/humidifier prn.  Please follow up if no improvement in 2-3 days.   It was a pleasure seeing you today! Please do not hesitate to reach out with any questions and or concerns.  Regards,   Mort Sawyers

## 2021-05-26 NOTE — Progress Notes (Signed)
MyChart Video Visit    Virtual Visit via Video Note   This visit type was conducted due to national recommendations for restrictions regarding the COVID-19 Pandemic (e.g. social distancing) in an effort to limit this patient's exposure and mitigate transmission in our community. This patient is at least at moderate risk for complications without adequate follow up. This format is felt to be most appropriate for this patient at this time. Physical exam was limited by quality of the video and audio technology used for the visit. CMA was able to get the patient set up on a video visit.  Patient location: Home. Patient and provider in visit Provider location: Office  I discussed the limitations of evaluation and management by telemedicine and the availability of in person appointments. The patient expressed understanding and agreed to proceed.  Visit Date: 05/26/2021  Today's healthcare provider: Mort Sawyers, FNP     Subjective:    Patient ID: Dakota Ellison, male    DOB: 07/01/1950, 70 y.o.   MRN: [id number]  Chief Complaint  Patient presents with   Cough   Nasal Congestion    Cough Pertinent negatives include no chest pain, chills, ear pain, fever, sore throat, shortness of breath or wheezing.   Pt here with c/o two week h/o cough with chest congestion. No improvement and in the last few days he is coughing up green sputum. Lots of 'snot in nose' and nasal congestion. Denies wheezing and or sob. He denies sore throat, ear pain, fever and or chills.   Past Medical History:  Diagnosis Date   Anxiety state 04/08/2014   denies   Complication of anesthesia    and trouble waking up   Degenerative disc disease    Dysthymia    denies   ED (erectile dysfunction)    GERD (gastroesophageal reflux disease)    History of MRSA infection    on lip from cut   Hyperlipemia    Hypertension    take BP med for small leak in heart to keep it small per pt.   Mild asthma    as a teen    Morbid obesity (HCC)    OSA (obstructive sleep apnea)    Osteoarthritis    PONV (postoperative nausea and vomiting)    Rectal fissure    Testosterone deficiency     Past Surgical History:  Procedure Laterality Date   HERNIA REPAIR     1998   KNEE SURGERY  09/2010   left knee and right knee arthroscopy   OTHER SURGICAL HISTORY     arthroscopic subacromial decompression   OTHER SURGICAL HISTORY     open resection, distal right clavicle   SHOULDER ARTHROSCOPY     right   SPHINCTEROTOMY     TOTAL KNEE ARTHROPLASTY Left 06/11/2018   Procedure: LEFT TOTAL KNEE ARTHROPLASTY;  Surgeon: [name], MD;  Location: [facility];  Service: Orthopedics;  Laterality: Left;    TOTAL KNEE ARTHROPLASTY Right 12/17/2018   Procedure: TOTAL KNEE ARTHROPLASTY;  Surgeon: [name], MD;  Location: [facility];  Service: Orthopedics;  Laterality: Right;     Family History  Problem Relation Age of Onset   Pneumonia Brother     Social History   Socioeconomic History   Marital status: Single    Spouse name: Not on file   Number of children: 1   Years of education: Not on file   Highest education level: Not on file  Occupational History   Occupation:  long distance driver    Comment: Retired   Occupation: [employer] part time    Comment: Engineer, materials  Tobacco Use   Smoking status: Never   Smokeless tobacco: Never   Tobacco comments:    CIGARS ONLY--QUIT 2013  Vaping Use   Vaping Use: Never used  Substance and Sexual Activity   Alcohol use: Yes    Alcohol/week: 0.0 standard drinks    Comment: rare//occassional   Drug use: No   Sexual activity: Yes  Other Topics Concern   Not on file  Social History Narrative   Has living will   Son is health care POA   Would accept resuscitation attempts   Likely wouldn't want extended tube feeds if cognitively unaware   Social Determinants of Health   Financial Resource Strain: Not on file  Food Insecurity: Not on file   Transportation Needs: Not on file  Physical Activity: Not on file  Stress: Not on file  Social Connections: Not on file  Intimate Partner Violence: Not on file    Outpatient Medications Prior to Visit  Medication Sig Dispense Refill   ALPRAZolam (XANAX) 0.5 MG tablet TAKE 1 TABLET BY MOUTH THREE TIMES DAILYAS NEEDED 90 tablet 0   amLODipine (NORVASC) 10 MG tablet TAKE 1 TABLET BY MOUTH DAILY 90 tablet 3   Apple Cider Vinegar 500 MG TABS Take 500 mg by mouth daily.      aspirin EC 81 MG tablet Take 81 mg by mouth daily. Swallow whole.     cetirizine (ZYRTEC) 10 MG tablet Take 10 mg by mouth daily.     Cholecalciferol (VITAMIN D) 50 MCG (2000 UT) CAPS Take 1 capsule by mouth daily.     CINNAMON PO Take 200 mg by mouth daily.     ezetimibe (ZETIA) 10 MG tablet TAKE 1 TABLET BY MOUTH DAILY 90 tablet 3   fluticasone (FLONASE) 50 MCG/ACT nasal spray Place 2 sprays into both nostrils in the morning and at bedtime. 16 g 0   Garlic 2000 MG CAPS Take 2,000 mg by mouth 2 (two) times daily.      Ginkgo Biloba 120 MG CAPS Take 120 mg by mouth daily.     HYDROcodone-acetaminophen (NORCO/VICODIN) 5-325 MG tablet TAKE 1 TABLET BY MOUTH THREE TIMES DAILYAS NEEDED FOR MODERATE PAIN 90 tablet 0   loperamide (IMODIUM A-D) 2 MG tablet Take 2 tablets (4 mg total) by mouth 4 (four) times daily as needed for diarrhea or loose stools. 30 tablet 0   Multiple Vitamin (MULTIVITAMIN WITH MINERALS) TABS tablet Take 1 tablet by mouth daily.     niacin 500 MG CR capsule Take 500 mg by mouth 2 (two) times daily.     Omega-3 Fatty Acids (FISH OIL) 1200 MG CPDR Take 1,200 mg by mouth daily.     omeprazole (PRILOSEC) 20 MG capsule TAKE 1 CAPSULE BY MOUTH DAILY 90 capsule 3   sertraline (ZOLOFT) 25 MG tablet TAKE 1 TABLET BY MOUTH DAILY 90 tablet 3   sildenafil (VIAGRA) 100 MG tablet Take as directed (Patient taking differently: Take 100 mg by mouth daily as needed for erectile dysfunction. Take as directed) 10 tablet 5    SYSTANE ULTRA 0.4-0.3 % SOLN Place 1 drop into both eyes 4 (four) times daily as needed (dry/irritated eyes.).     testosterone cypionate (DEPOTESTOSTERONE CYPIONATE) 200 MG/ML injection Inject 200 mg into the muscle every 14 (fourteen) days. Saturdays     Turmeric (QC TUMERIC COMPLEX PO) Take 500  mg by mouth daily.     valsartan-hydrochlorothiazide (DIOVAN-HCT) 320-25 MG tablet TAKE 1 TABLET BY MOUTH DAILY 90 tablet 3   vitamin C (ASCORBIC ACID) 500 MG tablet Take 500 mg by mouth daily.     zinc gluconate 50 MG tablet Take 50 mg by mouth daily.     triamcinolone (KENALOG) 0.1 % Apply 1 application topically 2 (two) times daily as needed. (Patient not taking: Reported on 05/11/2021) 45 g 1   No facility-administered medications prior to visit.    Allergies  Allergen Reactions   Atorvastatin Other (See Comments)    Muscle weakness in his legs   Crestor [Rosuvastatin] Other (See Comments)    Severe muscle cramping   Zocor [Simvastatin] Other (See Comments)    Muscle weakness in his legs    Review of Systems  Constitutional:  Negative for chills and fever.  HENT:  Positive for congestion. Negative for ear pain, sinus pain and sore throat.   Respiratory:  Positive for cough (wet productive green sputum). Negative for shortness of breath and wheezing.   Cardiovascular:  Negative for chest pain and palpitations.  All other systems reviewed and are negative.     Objective:    Physical Exam Constitutional:      General: He is awake. He is not in acute distress.    Appearance: Normal appearance. He is obese. He is not ill-appearing.  HENT:     Nose:     Right Turbinates: Not enlarged or swollen.     Left Turbinates: Not enlarged or swollen.     Right Sinus: No maxillary sinus tenderness or frontal sinus tenderness.     Left Sinus: No maxillary sinus tenderness or frontal sinus tenderness.     Mouth/Throat:     Pharynx: No pharyngeal swelling, oropharyngeal exudate or posterior  oropharyngeal erythema.  Pulmonary:     Effort: Pulmonary effort is normal.     Breath sounds: No wheezing.  Neurological:     General: No focal deficit present.     Mental Status: He is alert and oriented to person, place, and time.  Psychiatric:        Mood and Affect: Mood normal.        Behavior: Behavior normal.        Thought Content: Thought content normal.        Judgment: Judgment normal.    Ht 5' 10.5" (1.791 m)    Wt 290 lb (131.5 kg)    BMI 41.02 kg/m  Wt Readings from Last 3 Encounters:  05/26/21 290 lb (131.5 kg)  05/11/21 289 lb (131.1 kg)  03/22/21 289 lb 8 oz (131.3 kg)       Assessment & Plan:   Problem List Items Addressed This Visit       Respiratory   Upper respiratory infection, acute - Primary    I am having Dakota Ellison maintain his niacin, Ginkgo Biloba, sildenafil, Garlic, CINNAMON PO, Apple Cider Vinegar, multivitamin with minerals, testosterone cypionate, Systane Ultra, cetirizine, Turmeric (QC TUMERIC COMPLEX PO), loperamide, triamcinolone cream, fluticasone, ezetimibe, omeprazole, amLODipine, valsartan-hydrochlorothiazide, sertraline, ALPRAZolam, aspirin EC, vitamin C, Fish Oil, zinc gluconate, Vitamin D, and HYDROcodone-acetaminophen.  No orders of the defined types were placed in this encounter.   I discussed the assessment and treatment plan with the patient. The patient was provided an opportunity to ask questions and all were answered. The patient agreed with the plan and demonstrated an understanding of the instructions.   The patient was advised  to call back or seek an in-person evaluation if the symptoms worsen or if the condition fails to improve as anticipated.  I provided 15 minutes of face-to-face time during this encounter.   Mort Sawyers, FNP [facility] [place] [phone] (phone) [phone] (fax)  [facility]

## 2021-06-06 DIAGNOSIS — I480 Paroxysmal atrial fibrillation: Secondary | ICD-10-CM

## 2021-06-06 HISTORY — DX: Paroxysmal atrial fibrillation: I48.0

## 2021-06-08 ENCOUNTER — Telehealth: Payer: Self-pay | Admitting: Internal Medicine

## 2021-06-08 NOTE — Telephone Encounter (Signed)
Per OV Notes: Associated Diagnoses  Laceration of left hand without foreign body, initial encounter     Need for Td vaccine      Amy, can you contact billing for this pt or his insurance. Whoever you need to speak to. It should be covered under the laceration.

## 2021-06-08 NOTE — Telephone Encounter (Signed)
[name] called in due to he stated that he was here a month ago for an open wound and he received an injection but he got a bill in the mail from his insurance company due to they don't cover Tetanus shot, it doesn't show that he had open wound it just shows he got the shot. Wanted to know if he can get it recoded to show he had an open wound to get it paid by insurance.

## 2021-06-24 ENCOUNTER — Other Ambulatory Visit: Payer: Self-pay | Admitting: Internal Medicine

## 2021-06-25 NOTE — Telephone Encounter (Signed)
Name of Medication: Hydrocodone Name of Pharmacy: [facility] Last Fill or Written Date and Quantity: 05-26-21 #90 Last Office Visit and Type: 05-26-21 Acute Next Office Visit and Type: 08-16-21 Last Controlled Substance Agreement Date: 01-04-21 Last UDS: 01-04-21

## 2021-07-12 ENCOUNTER — Other Ambulatory Visit: Payer: Self-pay | Admitting: Internal Medicine

## 2021-07-12 NOTE — Telephone Encounter (Signed)
Last filled 04-13-21 #90 Last OV Acute 05-26-21 Next OV 08-16-21 [facility]

## 2021-07-16 ENCOUNTER — Telehealth: Payer: Self-pay

## 2021-07-16 NOTE — Progress Notes (Signed)
Chronic Care Management Pharmacy Assistant   Name: Dakota Ellison  MRN: [id number] DOB: January 21, 1951  Reason for Encounter: CCM (General Adherence)   Recent office visits:  05/11/2021 - Tillman Abide, MD - Patient presented for [facility] Wellness Visit. Labs: Lipid, CBC and CMP. No medication changes.   Recent consult visits:  05/26/2021 - Mort Sawyers, FNP - Family Medicine - Patient presented for cough and nasal congestion. Start: azithromycin (ZITHROMAX) 250 MG tablet. Start: benzonatate (TESSALON) 100 MG capsule.  Hospital visits:  None in previous 6 months  Medications: Outpatient Encounter Medications as of 07/16/2021  Medication Sig   ALPRAZolam (XANAX) 0.5 MG tablet TAKE 1 TABLET BY MOUTH THREE TIMES DAILYAS NEEDED   amLODipine (NORVASC) 10 MG tablet TAKE 1 TABLET BY MOUTH DAILY   Apple Cider Vinegar 500 MG TABS Take 500 mg by mouth daily.    aspirin EC 81 MG tablet Take 81 mg by mouth daily. Swallow whole.   benzonatate (TESSALON) 100 MG capsule Take 1 capsule (100 mg total) by mouth 3 (three) times daily as needed for cough.   cetirizine (ZYRTEC) 10 MG tablet Take 10 mg by mouth daily.   Cholecalciferol (VITAMIN D) 50 MCG (2000 UT) CAPS Take 1 capsule by mouth daily.   CINNAMON PO Take 200 mg by mouth daily.   ezetimibe (ZETIA) 10 MG tablet TAKE 1 TABLET BY MOUTH DAILY   fluticasone (FLONASE) 50 MCG/ACT nasal spray Place 2 sprays into both nostrils in the morning and at bedtime.   Garlic 2000 MG CAPS Take 2,000 mg by mouth 2 (two) times daily.    Ginkgo Biloba 120 MG CAPS Take 120 mg by mouth daily.   HYDROcodone-acetaminophen (NORCO/VICODIN) 5-325 MG tablet TAKE 1 TABLET BY MOUTH THREE TIMES DAILYAS NEEDED FOR MODERATE PAIN   loperamide (IMODIUM A-D) 2 MG tablet Take 2 tablets (4 mg total) by mouth 4 (four) times daily as needed for diarrhea or loose stools.   Multiple Vitamin (MULTIVITAMIN WITH MINERALS) TABS tablet Take 1 tablet by mouth daily.   niacin 500 MG CR  capsule Take 500 mg by mouth 2 (two) times daily.   Omega-3 Fatty Acids (FISH OIL) 1200 MG CPDR Take 1,200 mg by mouth daily.   omeprazole (PRILOSEC) 20 MG capsule TAKE 1 CAPSULE BY MOUTH DAILY   sertraline (ZOLOFT) 25 MG tablet TAKE 1 TABLET BY MOUTH DAILY   sildenafil (VIAGRA) 100 MG tablet Take as directed (Patient taking differently: Take 100 mg by mouth daily as needed for erectile dysfunction. Take as directed)   SYSTANE ULTRA 0.4-0.3 % SOLN Place 1 drop into both eyes 4 (four) times daily as needed (dry/irritated eyes.).   testosterone cypionate (DEPOTESTOSTERONE CYPIONATE) 200 MG/ML injection Inject 200 mg into the muscle every 14 (fourteen) days. Saturdays   triamcinolone (KENALOG) 0.1 % Apply 1 application topically 2 (two) times daily as needed. (Patient not taking: Reported on 05/11/2021)   Turmeric (QC TUMERIC COMPLEX PO) Take 500 mg by mouth daily.   valsartan-hydrochlorothiazide (DIOVAN-HCT) 320-25 MG tablet TAKE 1 TABLET BY MOUTH DAILY   vitamin C (ASCORBIC ACID) 500 MG tablet Take 500 mg by mouth daily.   zinc gluconate 50 MG tablet Take 50 mg by mouth daily.   No facility-administered encounter medications on file as of 07/16/2021.   Attempted contact with patient 3 times on 02/13, 02/14 and 02/15. Unsuccessful outreach. Will atttempt contact next month.  Star Medications: Medication Name/mg Last Fill Days Supply Valsartan-HCTZ  04/02/2021 90  Fill date verified  with [facility]  Care Gaps: Annual wellness visit in last year? Yes 05/11/2021 Most Recent BP reading: 110/72 on 05/11/2021  Upcoming appointments: PCP appointment on 08/16/2021 CCM appointment on 10/18/2021 @ 3:45 with [facility], CPP notified  Claudina Lick, [place] Clinical Pharmacy Assistant [phone]

## 2021-07-22 ENCOUNTER — Other Ambulatory Visit: Payer: Self-pay | Admitting: Internal Medicine

## 2021-07-22 NOTE — Telephone Encounter (Signed)
Name of Medication: Hydrocodone Name of Pharmacy: [facility] Last Fill or Written Date and Quantity: 06-25-21 #90 Last Office Visit and Type: 05-26-21 Acute Next Office Visit and Type: 08-16-21 Last Controlled Substance Agreement Date: 01-04-21 Last UDS: 01-04-21

## 2021-08-09 DIAGNOSIS — M545 Low back pain, unspecified: Secondary | ICD-10-CM | POA: Diagnosis not present

## 2021-08-09 DIAGNOSIS — M4310 Spondylolisthesis, site unspecified: Secondary | ICD-10-CM | POA: Diagnosis not present

## 2021-08-09 DIAGNOSIS — M48061 Spinal stenosis, lumbar region without neurogenic claudication: Secondary | ICD-10-CM | POA: Diagnosis not present

## 2021-08-16 ENCOUNTER — Encounter: Payer: Self-pay | Admitting: Internal Medicine

## 2021-08-16 ENCOUNTER — Ambulatory Visit (INDEPENDENT_AMBULATORY_CARE_PROVIDER_SITE_OTHER): Payer: Medicare Other | Admitting: Internal Medicine

## 2021-08-16 ENCOUNTER — Other Ambulatory Visit: Payer: Self-pay

## 2021-08-16 DIAGNOSIS — M48061 Spinal stenosis, lumbar region without neurogenic claudication: Secondary | ICD-10-CM

## 2021-08-16 DIAGNOSIS — F1120 Opioid dependence, uncomplicated: Secondary | ICD-10-CM

## 2021-08-16 NOTE — Assessment & Plan Note (Signed)
With chronic pain ?Maintains his function with the hydrocodone tid ?

## 2021-08-16 NOTE — Assessment & Plan Note (Signed)
PDMP reviewed No concerns 

## 2021-08-16 NOTE — Progress Notes (Signed)
? ?Subjective:  ? ? Patient ID: Dakota Ellison, male    DOB: 25-Apr-1951, 71 y.o.   MRN: [id number] ? ?HPI ?Here for follow up of chronic pain and narcotic dependence ? ?Doing okay ?Still uses the hydrocodone tid ?1 xanax in the morning ?Maintains function---does his yard work, instrumental ADLs ?Still drives prisoners part time for [employer] (but working 5 days per week lately) ? ?Current Outpatient Medications on File Prior to Visit  ?Medication Sig Dispense Refill  ? ALPRAZolam (XANAX) 0.5 MG tablet TAKE 1 TABLET BY MOUTH THREE TIMES DAILYAS NEEDED 90 tablet 0  ? amLODipine (NORVASC) 10 MG tablet TAKE 1 TABLET BY MOUTH DAILY 90 tablet 3  ? Apple Cider Vinegar 500 MG TABS Take 500 mg by mouth daily.     ? aspirin EC 81 MG tablet Take 81 mg by mouth daily. Swallow whole.    ? cetirizine (ZYRTEC) 10 MG tablet Take 10 mg by mouth daily.    ? Cholecalciferol (VITAMIN D) 50 MCG (2000 UT) CAPS Take 1 capsule by mouth daily.    ? CINNAMON PO Take 200 mg by mouth daily.    ? ezetimibe (ZETIA) 10 MG tablet TAKE 1 TABLET BY MOUTH DAILY 90 tablet 3  ? fluticasone (FLONASE) 50 MCG/ACT nasal spray Place 2 sprays into both nostrils in the morning and at bedtime. 16 g 0  ? Garlic 2000 MG CAPS Take 2,000 mg by mouth 2 (two) times daily.     ? Ginkgo Biloba 120 MG CAPS Take 120 mg by mouth daily.    ? HYDROcodone-acetaminophen (NORCO/VICODIN) 5-325 MG tablet TAKE 1 TABLET BY MOUTH THREE TIMES DAILYAS NEEDED FOR MODERATE PAIN 90 tablet 0  ? loperamide (IMODIUM A-D) 2 MG tablet Take 2 tablets (4 mg total) by mouth 4 (four) times daily as needed for diarrhea or loose stools. 30 tablet 0  ? Multiple Vitamin (MULTIVITAMIN WITH MINERALS) TABS tablet Take 1 tablet by mouth daily.    ? niacin 500 MG CR capsule Take 500 mg by mouth 2 (two) times daily.    ? Omega-3 Fatty Acids (FISH OIL) 1200 MG CPDR Take 1,200 mg by mouth daily.    ? omeprazole (PRILOSEC) 20 MG capsule TAKE 1 CAPSULE BY MOUTH DAILY 90 capsule 3  ? sertraline (ZOLOFT)  25 MG tablet TAKE 1 TABLET BY MOUTH DAILY 90 tablet 3  ? sildenafil (VIAGRA) 100 MG tablet Take as directed (Patient taking differently: Take 100 mg by mouth daily as needed for erectile dysfunction. Take as directed) 10 tablet 5  ? SYSTANE ULTRA 0.4-0.3 % SOLN Place 1 drop into both eyes 4 (four) times daily as needed (dry/irritated eyes.).    ? testosterone cypionate (DEPOTESTOSTERONE CYPIONATE) 200 MG/ML injection Inject 200 mg into the muscle every 14 (fourteen) days. Saturdays    ? triamcinolone (KENALOG) 0.1 % Apply 1 application topically 2 (two) times daily as needed. 45 g 1  ? Turmeric (QC TUMERIC COMPLEX PO) Take 500 mg by mouth daily.    ? valsartan-hydrochlorothiazide (DIOVAN-HCT) 320-25 MG tablet TAKE 1 TABLET BY MOUTH DAILY 90 tablet 3  ? vitamin C (ASCORBIC ACID) 500 MG tablet Take 500 mg by mouth daily.    ? zinc gluconate 50 MG tablet Take 50 mg by mouth daily.    ? ?No current facility-administered medications on file prior to visit.  ? ? ?Allergies  ?Allergen Reactions  ? Atorvastatin Other (See Comments)  ?  Muscle weakness in his legs  ? Crestor [Rosuvastatin]  Other (See Comments)  ?  Severe muscle cramping  ? Zocor [Simvastatin] Other (See Comments)  ?  Muscle weakness in his legs  ? ? ?Past Medical History:  ?Diagnosis Date  ? Anxiety state 04/08/2014  ? denies  ? Complication of anesthesia   ? and trouble waking up  ? Degenerative disc disease   ? Dysthymia   ? denies  ? ED (erectile dysfunction)   ? GERD (gastroesophageal reflux disease)   ? History of MRSA infection   ? on lip from cut  ? Hyperlipemia   ? Hypertension   ? take BP med for small leak in heart to keep it small per pt.  ? Mild asthma   ? as a teen  ? Morbid obesity (HCC)   ? OSA (obstructive sleep apnea)   ? Osteoarthritis   ? PONV (postoperative nausea and vomiting)   ? Rectal fissure   ? Testosterone deficiency   ? ? ?Past Surgical History:  ?Procedure Laterality Date  ? HERNIA REPAIR    ? 1998  ? KNEE SURGERY  09/2010  ? left  knee and right knee arthroscopy  ? OTHER SURGICAL HISTORY    ? arthroscopic subacromial decompression  ? OTHER SURGICAL HISTORY    ? open resection, distal right clavicle  ? SHOULDER ARTHROSCOPY    ? right  ? SPHINCTEROTOMY    ? TOTAL KNEE ARTHROPLASTY Left 06/11/2018  ? Procedure: LEFT TOTAL KNEE ARTHROPLASTY;  Surgeon: [name], MD;  Location: [facility];  Service: Orthopedics;  Laterality: Left;   ? TOTAL KNEE ARTHROPLASTY Right 12/17/2018  ? Procedure: TOTAL KNEE ARTHROPLASTY;  Surgeon: [name], MD;  Location: [facility];  Service: Orthopedics;  Laterality: Right;   ? ? ?Family History  ?Problem Relation Age of Onset  ? Pneumonia Brother   ? ? ?Social History  ? ?Socioeconomic History  ? Marital status: Single  ?  Spouse name: Not on file  ? Number of children: 1  ? Years of education: Not on file  ? Highest education level: Not on file  ?Occupational History  ? Occupation: long Estate agent  ?  Comment: Retired  ? Occupation: Transports prisoners part time  ?  Comment: Engineer, materials  ?Tobacco Use  ? Smoking status: Never  ? Smokeless tobacco: Never  ? Tobacco comments:  ?  CIGARS ONLY--QUIT 2013  ?Vaping Use  ? Vaping Use: Never used  ?Substance and Sexual Activity  ? Alcohol use: Yes  ?  Alcohol/week: 0.0 standard drinks  ?  Comment: rare//occassional  ? Drug use: No  ? Sexual activity: Yes  ?Other Topics Concern  ? Not on file  ?Social History Narrative  ? Has living will  ? Son is health care POA  ? Would accept resuscitation attempts  ? Likely wouldn't want extended tube feeds if cognitively unaware  ? ?Social Determinants of Health  ? ?Financial Resource Strain: Not on file  ?Food Insecurity: Not on file  ?Transportation Needs: Not on file  ?Physical Activity: Not on file  ?Stress: Not on file  ?Social Connections: Not on file  ?Intimate Partner Violence: Not on file  ? ?Review of Systems ?Appetite is good ?Weight up a few pounds ?Sleeps well with the CPAP nightly (will go to Dr  Maple Hudson again soon) ? ?   ?Objective:  ? Physical Exam ?Constitutional:   ?   Appearance: Normal appearance.  ?Neurological:  ?   Mental Status: He is alert.  ?Psychiatric:     ?  Mood and Affect: Mood normal.     ?   Behavior: Behavior normal.  ?  ? ? ? ? ?   ?Assessment & Plan:  ? ?

## 2021-08-17 ENCOUNTER — Telehealth: Payer: Self-pay

## 2021-08-17 NOTE — Progress Notes (Signed)
Transition CCM to Self Care ? ?Attempted contact with patient 3 times on 03/14, 03/15 and 03/16. Unsuccessful outreach.  ? ?Patient contacted to inform they have achieved their CCM goals and no longer need to be contacted as frequently. Patient advised services will still be available to them if they would like to reach out or have any new health concerns. Verified patient had contact information to pharmacist and health concierge on hand. Patient made aware CCM services would be continued if desired. Patient consented to cancel future CCM appointments. ? ?Lindsey Foltanski, CPP notified ? ?Lorena Benham, RMA ?Clinical Pharmacy Assistant ?[phone] ?

## 2021-08-18 ENCOUNTER — Other Ambulatory Visit: Payer: Self-pay | Admitting: Internal Medicine

## 2021-08-19 NOTE — Telephone Encounter (Signed)
Name of Medication: Hydrocodone ?Name of Pharmacy: [facility] ?Last Fill or Written Date and Quantity: 07-23-21 #90 ?Last Office Visit and Type: 08-16-21 ?Next Office Visit and Type: 11-15-21 ?Last Controlled Substance Agreement Date: 01-04-21 ?Last UDS: 01-04-21 ?

## 2021-09-16 ENCOUNTER — Other Ambulatory Visit: Payer: Self-pay | Admitting: Internal Medicine

## 2021-09-16 NOTE — Telephone Encounter (Signed)
Name of Medication: Hydrocodone ?Name of Pharmacy: [facility] ?Last Fill or Written Date and Quantity: 08-19-21 #90 ?Last Office Visit and Type: 08-16-21 ?Next Office Visit and Type: 11-15-21 ?Last Controlled Substance Agreement Date: 01-04-21 ?Last UDS: 01-04-21 ?

## 2021-10-07 ENCOUNTER — Encounter: Payer: Self-pay | Admitting: Internal Medicine

## 2021-10-09 NOTE — Progress Notes (Signed)
? ?Subjective:  ? ? Patient ID: Dakota Ellison, male    DOB: August 09, 1950, 71 y.o.   MRN: [id number] ? ?HPI ? male Never smoker followed for OSA complicated by morbid obesity, HBP, asthma, GERD ?NPSG 2010, AHI 82/ hr CPAP to 20 ? ?------------------------------------------------------------------------------------ ? ? ? ?10/07/20- 71 year old male never smoker followed for OSA, complicated by morbid obesity, HBP, asthma, GERD, ( works transporting inmates), Low Back Pain,  ?-alprazolam 0.5 tid prn,  ?CPAP auto 10-20/ [employer]     Resmed AirSense 10 AutoSet  ?Download-compliance 100%, AHI 12.5/ hr   Pressure limiting at 19.9cm max ?Body weight today- 286 lbs ?Covid vax- ?-----Wears CPAP-doing good ?He sleeps alone, but says he feels comfortable with CPAP and well-rested. We reviewed download and decided we would recheck on folow-up, but he will probably need BIPAP titration. Weight loss recommended. ? ?10/11/21- - 71 year old male never smoker followed for OSA, complicated by morbid obesity, HTN, Asthma, GERD, ( works transporting inmates), Low Back Pain, Hyperlipidemia,  ?-alprazolam 0.5 tid prn,  ?CPAP auto 10-20/ [facility]     Resmed AirSense 10 AutoSet  ?Download-compliance 97%, AHI 9.4/ hr  pressure limiting at 19.7 cwp ?Body weight today- 295 lbs ?Covid vax-3 Phizer ?Flu vax-had ?-----Patient is still interested in [place] states that he never heard anything.  ?He did ask for referral to ENT last year but apparently never heard.  He asked about inspire again at this visit and I discussed the basics.  I think he will be too heavy but agreed to put him in touch so that he could get the information he needs.  He feels he sleeps comfortably with CPAP although his breakthrough rate is a little higher than optimal.  He is managed well enough without transitioning to BiPAP which might require a new study. ?  ?ROS-see HPI   + = positive ?Constitutional:    weight loss, night sweats, fevers, chills, fatigue, lassitude. ?HEENT:     headaches, difficulty swallowing, tooth/dental problems, sore throat,  ?     sneezing, itching, ear ache, nasal congestion, post nasal drip, snoring ?CV:    chest pain, orthopnea, PND, swelling in lower extremities, anasarca,                                  dizziness, palpitations ?Resp:   shortness of breath with exertion or at rest.   ?             productive cough,   non-productive cough, coughing up of blood.   ?           change in color of mucus.  wheezing.   ?Skin:    rash or lesions. ?GI:  No-   heartburn, indigestion, abdominal pain, nausea, vomiting, diarrhea,  ?               change in bowel habits, loss of appetite ?GU: dysuria, change in color of urine, no urgency or frequency.   flank pain. ?MS:   joint pain, stiffness, decreased range of motion, back pain. ?Neuro-     nothing unusual ?Psych:  change in mood or affect.  depression or anxiety.   memory loss. ?   ?Objective:  ? OBJ- Physical Exam       ?General- Alert, Oriented, Affect-appropriate, Distress- none acute, + obese/big man ?Skin- rash-none, lesions- none, excoriation- none ?Lymphadenopathy- none ?Head- atraumatic ?  Eyes- Gross vision intact, PERRLA, conjunctivae and secretions clear ?           Ears- Hearing, canals-normal ?           Nose- Clear, no-Septal dev, mucus, polyps, erosion, perforation  ?           Throat- Mallampati III , mucosa clear , drainage- none, tonsils- atrophic ?Neck- flexible , trachea midline, no stridor , thyroid nl, carotid no bruit ?Chest - symmetrical excursion , unlabored ?          Heart/CV- RRR/ occ skip , no murmur , no gallop  , no rub, nl s1 s2 ?                          - JVD- none , edema- none, stasis changes- none, varices- none ?          Lung- clear to P&A, wheeze- none, cough- none , dullness-none, rub- none ?          Chest wall-  ?Abd-  ?Br/ Gen/ Rectal- Not done, not indicated ?Extrem- cyanosis- none, clubbing, none, atrophy- none, strength- nl, + healing TKR incision ?Neuro- grossly  intact to observation ?   ?Assessment & Plan:  ? ?

## 2021-10-11 ENCOUNTER — Telehealth: Payer: Self-pay | Admitting: Internal Medicine

## 2021-10-11 ENCOUNTER — Other Ambulatory Visit: Payer: Self-pay | Admitting: Internal Medicine

## 2021-10-11 ENCOUNTER — Encounter: Payer: Self-pay | Admitting: Internal Medicine

## 2021-10-11 ENCOUNTER — Ambulatory Visit: Payer: Medicare Other | Admitting: Internal Medicine

## 2021-10-11 VITALS — BP 128/70 | HR 93 | Temp 98.5°F | Ht 72.0 in | Wt 295.8 lb

## 2021-10-11 DIAGNOSIS — G4733 Obstructive sleep apnea (adult) (pediatric): Secondary | ICD-10-CM | POA: Diagnosis not present

## 2021-10-11 DIAGNOSIS — Z9989 Dependence on other enabling machines and devices: Secondary | ICD-10-CM | POA: Diagnosis not present

## 2021-10-11 NOTE — Patient Instructions (Signed)
Order- referral to ENT Drs Jonette Eva  consider Dawna Part ? ?Please call if we can help ?

## 2021-10-11 NOTE — Telephone Encounter (Signed)
Patient states that he was given Inspire packet and called the number on there and was told that a referral needs to be sent to [facility] at [phone].  ? ?Please advise. ?

## 2021-10-11 NOTE — Telephone Encounter (Signed)
This has already been taken care of referral was placed by the nurse we faxed it and sent records and then documented on the order ?

## 2021-10-11 NOTE — Assessment & Plan Note (Signed)
Still on the edge of needing a higher capacity machine.  Since he wants to look into inspire we will continue with current machine for now.  If he does not go with inspire (may be too heavy) then I when time to replace his old CPAP machine, we may transition to BiPAP. ?Plan-referral made for BiPAP information ?

## 2021-10-11 NOTE — Assessment & Plan Note (Signed)
Weight loss encouraged.  Weight is creeping up and he needs more attention to diet and exercise especially now that he has had his knees replaced. ?

## 2021-10-12 NOTE — Telephone Encounter (Signed)
Last filled 07-12-21 #90 ?Last OV 08-16-21 ?Next OV 11-15-21 ?[facility] ?

## 2021-10-13 ENCOUNTER — Other Ambulatory Visit: Payer: Self-pay | Admitting: Internal Medicine

## 2021-10-14 NOTE — Telephone Encounter (Signed)
Name of Medication: Hydrocodone ?Name of Pharmacy: [facility] ?Last Fill or Written Date and Quantity: 09-16-21 #90 ?Last Office Visit and Type: 08-16-21 ?Next Office Visit and Type: 11-15-21 ?Last Controlled Substance Agreement Date: 01-04-21 ?Last UDS: 01-04-21 ?

## 2021-10-18 ENCOUNTER — Telehealth: Payer: Medicare Other

## 2021-11-12 ENCOUNTER — Other Ambulatory Visit: Payer: Self-pay

## 2021-11-12 NOTE — Telephone Encounter (Signed)
Name of Medication: hydrocodone apap 5-325 mg Name of Pharmacy: [facility] Last Fill or Written Date and Quantity: #90 on 10/14/2021 Last Office Visit and Type: 08/16/21 for 3 mth FU Next Office Visit and Type:11/15/21 for pain mgt  Last Controlled Substance Agreement Date: 01/07/2021 Last UDS:01/04/2021

## 2021-11-14 MED ORDER — HYDROCODONE-ACETAMINOPHEN 5-325 MG PO TABS: ORAL_TABLET | ORAL | 0 refills | Status: DC

## 2021-11-15 ENCOUNTER — Encounter: Payer: Self-pay | Admitting: Internal Medicine

## 2021-11-15 ENCOUNTER — Ambulatory Visit (INDEPENDENT_AMBULATORY_CARE_PROVIDER_SITE_OTHER): Payer: Medicare Other | Admitting: Internal Medicine

## 2021-11-15 DIAGNOSIS — M48061 Spinal stenosis, lumbar region without neurogenic claudication: Secondary | ICD-10-CM | POA: Diagnosis not present

## 2021-11-15 DIAGNOSIS — F1120 Opioid dependence, uncomplicated: Secondary | ICD-10-CM

## 2021-11-15 MED ORDER — HYDROCODONE-ACETAMINOPHEN 5-325 MG PO TABS: 1.0000 | ORAL_TABLET | Freq: Three times a day (TID) | ORAL | 0 refills | Status: DC

## 2021-11-15 NOTE — Progress Notes (Signed)
Subjective:    Patient ID: Dakota Ellison, male    DOB: November 08, 1950, 71 y.o.   MRN: [id number]  HPI Here for follow up of chronic pain and narcotic dependence  He feels the pain has gotten worse Using 3 daily for a long time----the morning dose runs out within an hour or so, often has to take another right away He is running low or has to delay doses or skipping afternoon dose  Will try some tylenol or ibuprofen   Current Outpatient Medications on File Prior to Visit  Medication Sig Dispense Refill   ALPRAZolam (XANAX) 0.5 MG tablet TAKE 1 TABLET BY MOUTH THREE TIMES DAILYAS NEEDED 90 tablet 0   amLODipine (NORVASC) 10 MG tablet TAKE 1 TABLET BY MOUTH DAILY 90 tablet 3   Apple Cider Vinegar 500 MG TABS Take 500 mg by mouth daily.      aspirin EC 81 MG tablet Take 81 mg by mouth daily. Swallow whole.     cetirizine (ZYRTEC) 10 MG tablet Take 10 mg by mouth daily.     Cholecalciferol (VITAMIN D) 50 MCG (2000 UT) CAPS Take 1 capsule by mouth daily.     CINNAMON PO Take 200 mg by mouth daily.     ezetimibe (ZETIA) 10 MG tablet TAKE 1 TABLET BY MOUTH DAILY 90 tablet 3   fluticasone (FLONASE) 50 MCG/ACT nasal spray Place 2 sprays into both nostrils in the morning and at bedtime. 16 g 0   Garlic 2000 MG CAPS Take 2,000 mg by mouth 2 (two) times daily.      Ginkgo Biloba 120 MG CAPS Take 120 mg by mouth daily.     HYDROcodone-acetaminophen (NORCO/VICODIN) 5-325 MG tablet TAKE 1 TABLET BY MOUTH THREE TIMES DAILYAS NEEDED FOR MODERATE PAIN 90 tablet 0   loperamide (IMODIUM A-D) 2 MG tablet Take 2 tablets (4 mg total) by mouth 4 (four) times daily as needed for diarrhea or loose stools. 30 tablet 0   Multiple Vitamin (MULTIVITAMIN WITH MINERALS) TABS tablet Take 1 tablet by mouth daily.     niacin 500 MG CR capsule Take 500 mg by mouth 2 (two) times daily.     Omega-3 Fatty Acids (FISH OIL) 1200 MG CPDR Take 1,200 mg by mouth daily.     omeprazole (PRILOSEC) 20 MG capsule TAKE 1 CAPSULE BY MOUTH  DAILY 90 capsule 3   sertraline (ZOLOFT) 25 MG tablet TAKE 1 TABLET BY MOUTH DAILY 90 tablet 3   sildenafil (VIAGRA) 100 MG tablet Take as directed (Patient taking differently: Take 100 mg by mouth daily as needed for erectile dysfunction. Take as directed) 10 tablet 5   SYSTANE ULTRA 0.4-0.3 % SOLN Place 1 drop into both eyes 4 (four) times daily as needed (dry/irritated eyes.).     testosterone cypionate (DEPOTESTOSTERONE CYPIONATE) 200 MG/ML injection Inject 200 mg into the muscle every 14 (fourteen) days. Saturdays     triamcinolone (KENALOG) 0.1 % Apply 1 application topically 2 (two) times daily as needed. 45 g 1   Turmeric (QC TUMERIC COMPLEX PO) Take 500 mg by mouth daily.     valsartan-hydrochlorothiazide (DIOVAN-HCT) 320-25 MG tablet TAKE 1 TABLET BY MOUTH DAILY 90 tablet 3   vitamin C (ASCORBIC ACID) 500 MG tablet Take 500 mg by mouth daily.     zinc gluconate 50 MG tablet Take 50 mg by mouth daily.     No current facility-administered medications on file prior to visit.    Allergies  Allergen Reactions  Atorvastatin Other (See Comments)    Muscle weakness in his legs   Crestor [Rosuvastatin] Other (See Comments)    Severe muscle cramping   Zocor [Simvastatin] Other (See Comments)    Muscle weakness in his legs    Past Medical History:  Diagnosis Date   Anxiety state 04/08/2014   denies   Complication of anesthesia    and trouble waking up   Degenerative disc disease    Dysthymia    denies   ED (erectile dysfunction)    GERD (gastroesophageal reflux disease)    History of MRSA infection    on lip from cut   Hyperlipemia    Hypertension    take BP med for small leak in heart to keep it small per pt.   Mild asthma    as a teen   Morbid obesity (HCC)    OSA (obstructive sleep apnea)    Osteoarthritis    PONV (postoperative nausea and vomiting)    Rectal fissure    Testosterone deficiency     Past Surgical History:  Procedure Laterality Date   HERNIA REPAIR      1998   KNEE SURGERY  09/2010   left knee and right knee arthroscopy   OTHER SURGICAL HISTORY     arthroscopic subacromial decompression   OTHER SURGICAL HISTORY     open resection, distal right clavicle   SHOULDER ARTHROSCOPY     right   SPHINCTEROTOMY     TOTAL KNEE ARTHROPLASTY Left 06/11/2018   Procedure: LEFT TOTAL KNEE ARTHROPLASTY;  Surgeon: [name], MD;  Location: [facility];  Service: Orthopedics;  Laterality: Left;    TOTAL KNEE ARTHROPLASTY Right 12/17/2018   Procedure: TOTAL KNEE ARTHROPLASTY;  Surgeon: [name], MD;  Location: [facility];  Service: Orthopedics;  Laterality: Right;     Family History  Problem Relation Age of Onset   Pneumonia Brother     Social History   Socioeconomic History   Marital status: Single    Spouse name: Not on file   Number of children: 1   Years of education: Not on file   Highest education level: Not on file  Occupational History   Occupation: long distance driver    Comment: Retired   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, materials  Tobacco Use   Smoking status: Never   Smokeless tobacco: Never   Tobacco comments:    CIGARS ONLY--QUIT 2013  Vaping Use   Vaping Use: Never used  Substance and Sexual Activity   Alcohol use: Yes    Alcohol/week: 0.0 standard drinks of alcohol    Comment: rare//occassional   Drug use: No   Sexual activity: Yes  Other Topics Concern   Not on file  Social History Narrative   Has living will   Son is health care POA   Would accept resuscitation attempts   Likely wouldn't want extended tube feeds if cognitively unaware   Social Determinants of Health   Financial Resource Strain: Not on file  Food Insecurity: Not on file  Transportation Needs: Not on file  Physical Activity: Not on file  Stress: Not on file  Social Connections: Not on file  Intimate Partner Violence: Not on file   Review of Systems Sleeps well with the CPAP Nocturia x  2 Appetite is okay    Objective:   Physical Exam Constitutional:      Appearance: Normal appearance.  Neurological:     Mental Status:  He is alert.  Psychiatric:        Mood and Affect: Mood normal.        Behavior: Behavior normal.            Assessment & Plan:

## 2021-11-15 NOTE — Assessment & Plan Note (Addendum)
Back pain has worsened---especially in the morning Usually does okay later in the day  Will okay an increase to 4 per day---so he can take 2 in the morning, and then 1 twice a day after He also notes some balance issues----discussed that can be neurologic from sensory changes and it should be better if he uses a walking stick on uneven ground.

## 2021-11-15 NOTE — Assessment & Plan Note (Signed)
PDMP reviewed No concerns 

## 2021-12-15 ENCOUNTER — Other Ambulatory Visit: Payer: Self-pay | Admitting: Internal Medicine

## 2021-12-15 NOTE — Telephone Encounter (Signed)
Caller Name: Dakota Ellison Call back phone #: [phone]  MEDICATION(S): HYDROcodone-acetaminophen (NORCO/VICODIN) 5-325 MG tablet   Days of Med Remaining: A couple, he will be out tomorrow  Has the patient contacted their pharmacy (YES/NO)?  Yes, they said they sent over mulitple request. I only see 1 in the s-drive IF YES, when and what did the pharmacy advise?  IF NO, request that the patient contact the pharmacy for the refills in the future.             The pharmacy will send an electronic request (except for controlled medications).  Preferred Pharmacy: [facility]  ~~~Please advise patient/caregiver to allow 2-3 business days to process RX refills.

## 2021-12-16 MED ORDER — HYDROCODONE-ACETAMINOPHEN 5-325 MG PO TABS: 1.0000 | ORAL_TABLET | Freq: Three times a day (TID) | ORAL | 0 refills | Status: DC

## 2021-12-16 NOTE — Telephone Encounter (Signed)
[facility] s drive faxed request for Name of Medication: Hydrocodone apap 5-325 mg Name of Pharmacy: [facility] Last Fill or Written Date and Quantity: # 120 on 11/15/2021 Last Office Visit and Type: 11/15/2021 for pain Next Office Visit and Type: 02/21/2022 for 3 mth FU Last Controlled Substance Agreement Date: 01/04/2021 Last UDS:01/07/2021

## 2021-12-20 DIAGNOSIS — G8929 Other chronic pain: Secondary | ICD-10-CM | POA: Diagnosis not present

## 2021-12-20 DIAGNOSIS — M25552 Pain in left hip: Secondary | ICD-10-CM | POA: Diagnosis not present

## 2021-12-20 DIAGNOSIS — M25551 Pain in right hip: Secondary | ICD-10-CM | POA: Diagnosis not present

## 2021-12-20 DIAGNOSIS — M5416 Radiculopathy, lumbar region: Secondary | ICD-10-CM | POA: Diagnosis not present

## 2021-12-20 DIAGNOSIS — M545 Low back pain, unspecified: Secondary | ICD-10-CM | POA: Diagnosis not present

## 2022-01-07 ENCOUNTER — Other Ambulatory Visit: Payer: Self-pay

## 2022-01-07 MED ORDER — ALPRAZOLAM 0.5 MG PO TABS: ORAL_TABLET | ORAL | 0 refills | Status: DC

## 2022-01-07 NOTE — Telephone Encounter (Signed)
Last filled 10-12-21 #90 Last OV 11-15-21 Next OV 02-21-22 [facility]

## 2022-01-10 ENCOUNTER — Other Ambulatory Visit: Payer: Self-pay | Admitting: Internal Medicine

## 2022-01-10 ENCOUNTER — Telehealth: Payer: Self-pay | Admitting: Internal Medicine

## 2022-01-10 NOTE — Telephone Encounter (Signed)
Name of Medication: Hydrocodone apap 5-325 mg Name of Pharmacy: [facility] Last Fill or Written Date and Quantity: # 120 on 12/16/2021 Last Office Visit and Type: 11/15/2021 for pain Next Office Visit and Type: 02/21/2022 for 3 mth FU Last Controlled Substance Agreement Date: 01/04/2021 Last UDS:01/07/2021

## 2022-01-10 NOTE — Telephone Encounter (Signed)
I copied this note and pasted it to the message already opened about his refill.

## 2022-01-10 NOTE — Telephone Encounter (Signed)
Called and spoke to [place] at [employer]. Pt has received #120 on 7-13 and 6-13. This rx needs to read #120 for 30 days. She said they have an rx for today for #90. I advised no rx has been sent today.

## 2022-01-10 NOTE — Telephone Encounter (Signed)
Patient called in stating that his pharmacy is having issues with getting his prescription of HYDROcodone-acetaminophen (NORCO/VICODIN) 5-325 MG tablet refilled. Stated that the pharmacy is trying to refill for 90 days when the refill should be 120 days. Thank you!

## 2022-01-10 NOTE — Telephone Encounter (Signed)
Pt called and another message was opened with this comment: Patient called in stating that his pharmacy is having issues with getting his prescription of HYDROcodone-acetaminophen (NORCO/VICODIN) 5-325 MG tablet refilled. Stated that the pharmacy is trying to refill for 90 days when the refill should be 120 days. Thank you!

## 2022-01-10 NOTE — Telephone Encounter (Signed)
  Encourage patient to contact the pharmacy for refills or they can request refills through [facility]  LAST APPOINTMENT DATE:  Please schedule appointment if longer than 1 year  NEXT APPOINTMENT DATE:  MEDICATION:HYDROcodone-acetaminophen (NORCO/VICODIN) 5-325 MG tablet  Is the patient out of medication?   PHARMACY:[facility] - [place], [place] -  Let patient know to contact pharmacy at the end of the day to make sure medication is ready.  Please notify patient to allow 48-72 hours to process  CLINICAL FILLS OUT ALL BELOW:   LAST REFILL:  QTY:  REFILL DATE:    OTHER COMMENTS:    Okay for refill?  Please advise

## 2022-01-11 MED ORDER — HYDROCODONE-ACETAMINOPHEN 5-325 MG PO TABS: 1.0000 | ORAL_TABLET | Freq: Three times a day (TID) | ORAL | 0 refills | Status: DC

## 2022-01-19 ENCOUNTER — Other Ambulatory Visit: Payer: Self-pay

## 2022-01-19 ENCOUNTER — Emergency Department (HOSPITAL_COMMUNITY)
Admission: EM | Admit: 2022-01-19 | Discharge: 2022-01-19 | Discharge status: Against Medical Advice | Payer: Medicare Other | Attending: Emergency Medicine | Admitting: Emergency Medicine

## 2022-01-19 ENCOUNTER — Emergency Department (HOSPITAL_COMMUNITY): Payer: Medicare Other

## 2022-01-19 DIAGNOSIS — R531 Weakness: Secondary | ICD-10-CM | POA: Diagnosis not present

## 2022-01-19 DIAGNOSIS — Z5321 Procedure and treatment not carried out due to patient leaving prior to being seen by health care provider: Secondary | ICD-10-CM | POA: Insufficient documentation

## 2022-01-19 DIAGNOSIS — R55 Syncope and collapse: Secondary | ICD-10-CM | POA: Diagnosis not present

## 2022-01-19 DIAGNOSIS — R42 Dizziness and giddiness: Secondary | ICD-10-CM | POA: Insufficient documentation

## 2022-01-19 LAB — CBC WITH DIFFERENTIAL/PLATELET
Abs Immature Granulocytes: 0.07 10*3/uL (ref 0.00–0.07)
Basophils Absolute: 0.1 10*3/uL (ref 0.0–0.1)
Basophils Relative: 1 %
Eosinophils Absolute: 0.1 10*3/uL (ref 0.0–0.5)
Eosinophils Relative: 1 %
HCT: 49.3 % (ref 39.0–52.0)
Hemoglobin: 17.1 g/dL — ABNORMAL HIGH (ref 13.0–17.0)
Immature Granulocytes: 1 %
Lymphocytes Relative: 14 %
Lymphs Abs: 1.7 10*3/uL (ref 0.7–4.0)
MCH: 29.7 pg (ref 26.0–34.0)
MCHC: 34.7 g/dL (ref 30.0–36.0)
MCV: 85.6 fL (ref 80.0–100.0)
Monocytes Absolute: 0.6 10*3/uL (ref 0.1–1.0)
Monocytes Relative: 5 %
Neutro Abs: 9.5 10*3/uL — ABNORMAL HIGH (ref 1.7–7.7)
Neutrophils Relative %: 78 %
Platelets: 265 10*3/uL (ref 150–400)
RBC: 5.76 MIL/uL (ref 4.22–5.81)
RDW: 14.6 % (ref 11.5–15.5)
WBC: 12 10*3/uL — ABNORMAL HIGH (ref 4.0–10.5)
nRBC: 0 % (ref 0.0–0.2)

## 2022-01-19 LAB — COMPREHENSIVE METABOLIC PANEL
ALT: 34 U/L (ref 0–44)
AST: 29 U/L (ref 15–41)
Albumin: 4.3 g/dL (ref 3.5–5.0)
Alkaline Phosphatase: 65 U/L (ref 38–126)
Anion gap: 12 (ref 5–15)
BUN: 23 mg/dL (ref 8–23)
CO2: 22 mmol/L (ref 22–32)
Calcium: 9.9 mg/dL (ref 8.9–10.3)
Chloride: 104 mmol/L (ref 98–111)
Creatinine, Ser: 1.52 mg/dL — ABNORMAL HIGH (ref 0.61–1.24)
GFR, Estimated: 49 mL/min — ABNORMAL LOW (ref >60)
Glucose, Bld: 168 mg/dL — ABNORMAL HIGH (ref 70–99)
Potassium: 3.6 mmol/L (ref 3.5–5.1)
Sodium: 138 mmol/L (ref 135–145)
Total Bilirubin: 1.1 mg/dL (ref 0.3–1.2)
Total Protein: 7.5 g/dL (ref 6.5–8.1)

## 2022-01-19 LAB — TROPONIN I (HIGH SENSITIVITY): Troponin I (High Sensitivity): 7 ng/L (ref <18)

## 2022-01-19 NOTE — ED Triage Notes (Signed)
Pt BIB EMS from home.  Working outside about 9 to 12 this morning.  Dizziness, weakness.  Drove to firestation for help. Stroke assessment normal with EMS. Dry mouth. No SHOB VS 122/82, pulse 100, O2 96%, RR 16

## 2022-01-19 NOTE — ED Provider Triage Note (Signed)
Emergency Medicine Provider Triage Evaluation Note  CORTLAN DOLIN , a 71 y.o. male  was evaluated in triage.  Pt complains of weakness to bilateral upper arms lightheadedness/dizziness.  Patient reports that this morning he was outside washing his car when he started feeling lightheaded and dizzy.  Patient also reports that he had weakness to bilateral arms and shoulders.  Patient reports that his lightheadedness has resolved however he has intermittent dizziness when standing.  Patient reports that his bilateral arm weakness is still present  Review of Systems  Positive: Lightheadedness, dizziness, weakness Negative: Chest pain, shortness of breath, visual disturbance, facial asymmetry, dysarthria, leg swelling or tenderness, palpitations, syncope  Physical Exam  BP (!) 121/99   Pulse (!) 54   Temp 98.2 F (36.8 C) (Oral)   Resp 18   SpO2 96%  Gen:   Awake, no distress   Resp:  Normal effort  MSK:   Moves extremities without difficulty  Other:  +2 radial pulse bilaterally.  CN II through XII intact.  Grip strength equal.  +5 strength to bilateral upper and lower extremities.  Pronator drift negative.  Medical Decision Making  Medically screening exam initiated at 2:49 PM.  Appropriate orders placed.  JARETT DRALLE was informed that the remainder of the evaluation will be completed by another provider, this initial triage assessment does not replace that evaluation, and the importance of remaining in the ED until their evaluation is complete.     Haskel Schroeder, [place] 01/19/22 1451

## 2022-01-19 NOTE — ED Notes (Signed)
Pt left building didn't give reason why.

## 2022-01-20 ENCOUNTER — Telehealth: Payer: Self-pay | Admitting: Cardiovascular Disease

## 2022-01-20 ENCOUNTER — Encounter: Payer: Self-pay | Admitting: Internal Medicine

## 2022-01-20 ENCOUNTER — Telehealth: Payer: Self-pay

## 2022-01-20 ENCOUNTER — Encounter: Payer: Self-pay | Admitting: Cardiovascular Disease

## 2022-01-20 NOTE — Telephone Encounter (Signed)
He also sent a MyChart message that was sent to Dr Alphonsus Sias. I will close this message so we don't have 2 open.

## 2022-01-20 NOTE — Telephone Encounter (Signed)
Pt states he went to the ER yesterday, they did an EKG and he wants Dr. Mariah Milling to look over the results

## 2022-01-20 NOTE — Telephone Encounter (Signed)
Patient called in stating that he would like to know the results of his imaging done in the ED. Advised to schedule an appointment but patient states he doesn't want to, and wants to know if Dr.Letvak will just look at them and tell him what his opinion is.

## 2022-01-21 MED ORDER — APIXABAN 5 MG PO TABS: 5.0000 mg | ORAL_TABLET | Freq: Two times a day (BID) | ORAL | 11 refills | Status: DC

## 2022-01-21 NOTE — Telephone Encounter (Signed)
Spoke to pt. Made appt for 3pm on 01-24-22.

## 2022-01-24 ENCOUNTER — Ambulatory Visit (INDEPENDENT_AMBULATORY_CARE_PROVIDER_SITE_OTHER): Payer: Medicare Other | Admitting: Internal Medicine

## 2022-01-24 ENCOUNTER — Other Ambulatory Visit: Payer: Self-pay | Admitting: Internal Medicine

## 2022-01-24 ENCOUNTER — Encounter: Payer: Self-pay | Admitting: Internal Medicine

## 2022-01-24 DIAGNOSIS — I4891 Unspecified atrial fibrillation: Secondary | ICD-10-CM

## 2022-01-24 MED ORDER — METOPROLOL SUCCINATE ER 25 MG PO TB24: 25.0000 mg | ORAL_TABLET | Freq: Every day | ORAL | 3 refills | Status: DC

## 2022-01-24 NOTE — Telephone Encounter (Signed)
Pt returning a call.

## 2022-01-24 NOTE — Telephone Encounter (Signed)
EKG in the emergency room showed atrial fibrillation which I believe is a new finding for him  Lab work looks like he was dehydrated with renal dysfunction  We should set him up to be seen in clinic to go over A-fib finding  Thx  TG

## 2022-01-24 NOTE — Telephone Encounter (Signed)
Left message to call back  

## 2022-01-24 NOTE — Progress Notes (Signed)
Subjective:    Patient ID: Dakota Ellison, male    DOB: 08-Jul-1950, 71 y.o.   MRN: [id number]  HPI Here due to apparent new onset of atrial fibrillation  8/16--was outside working at son's house due to a tree going down Then went to wash car Got sweaty (more than usual), lightheaded, felt ready to pass out Got funny feeling in both shoulders and arms  Went inside with air conditioning Drank something--and didn't feel better  Went to fire station---no one came to the door Was going to go home but still didn't feel right Called EMS--and EMT's came back to station Taken to hospital--noted a cardiac abnormality EKG and blood work done in about an hour---but then no one checked him 6 hours later, so he left Did feel better by then--but not "right"  No chest pain--but still feels weak and less energy Breathing is okay  Blood work benign other than GFR 49  Current Outpatient Medications on File Prior to Visit  Medication Sig Dispense Refill   ALPRAZolam (XANAX) 0.5 MG tablet TAKE 1 TABLET BY MOUTH THREE TIMES DAILYAS NEEDED 90 tablet 0   apixaban (ELIQUIS) 5 MG TABS tablet Take 1 tablet (5 mg total) by mouth 2 (two) times daily. 60 tablet 11   Apple Cider Vinegar 500 MG TABS Take 500 mg by mouth daily.      cetirizine (ZYRTEC) 10 MG tablet Take 10 mg by mouth daily.     Cholecalciferol (VITAMIN D) 50 MCG (2000 UT) CAPS Take 1 capsule by mouth daily.     CINNAMON PO Take 200 mg by mouth daily.     fluticasone (FLONASE) 50 MCG/ACT nasal spray Place 2 sprays into both nostrils in the morning and at bedtime. 16 g 0   Garlic 2000 MG CAPS Take 2,000 mg by mouth 2 (two) times daily.      Ginkgo Biloba 120 MG CAPS Take 120 mg by mouth daily.     HYDROcodone-acetaminophen (NORCO/VICODIN) 5-325 MG tablet Take 1-2 tablets by mouth in the morning, at noon, and at bedtime. 120 tablet 0   loperamide (IMODIUM A-D) 2 MG tablet Take 2 tablets (4 mg total) by mouth 4 (four) times daily as needed for  diarrhea or loose stools. 30 tablet 0   Multiple Vitamin (MULTIVITAMIN WITH MINERALS) TABS tablet Take 1 tablet by mouth daily.     niacin 500 MG CR capsule Take 500 mg by mouth 2 (two) times daily.     Omega-3 Fatty Acids (FISH OIL) 1200 MG CPDR Take 1,200 mg by mouth daily.     sildenafil (VIAGRA) 100 MG tablet Take as directed (Patient taking differently: Take 100 mg by mouth daily as needed for erectile dysfunction. Take as directed) 10 tablet 5   SYSTANE ULTRA 0.4-0.3 % SOLN Place 1 drop into both eyes 4 (four) times daily as needed (dry/irritated eyes.).     testosterone cypionate (DEPOTESTOSTERONE CYPIONATE) 200 MG/ML injection Inject 200 mg into the muscle every 14 (fourteen) days. Saturdays     triamcinolone (KENALOG) 0.1 % Apply 1 application topically 2 (two) times daily as needed. 45 g 1   Turmeric (QC TUMERIC COMPLEX PO) Take 500 mg by mouth daily.     vitamin C (ASCORBIC ACID) 500 MG tablet Take 500 mg by mouth daily.     zinc gluconate 50 MG tablet Take 50 mg by mouth daily.     No current facility-administered medications on file prior to visit.    Allergies  Allergen Reactions   Atorvastatin Other (See Comments)    Muscle weakness in his legs   Crestor [Rosuvastatin] Other (See Comments)    Severe muscle cramping   Zocor [Simvastatin] Other (See Comments)    Muscle weakness in his legs    Past Medical History:  Diagnosis Date   Anxiety state 04/08/2014   denies   Complication of anesthesia    and trouble waking up   Degenerative disc disease    Dysthymia    denies   ED (erectile dysfunction)    GERD (gastroesophageal reflux disease)    History of MRSA infection    on lip from cut   Hyperlipemia    Hypertension    take BP med for small leak in heart to keep it small per pt.   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   Procedure: LEFT TOTAL KNEE ARTHROPLASTY;  Surgeon: [name], MD;  Location: [facility];  Service: Orthopedics;  Laterality: Left;    TOTAL KNEE ARTHROPLASTY Right 12/17/2018   Procedure: TOTAL KNEE ARTHROPLASTY;  Surgeon: [name], MD;  Location: [facility];  Service: Orthopedics;  Laterality: Right;     Family History  Problem Relation Age of Onset   Pneumonia Brother     Social History   Socioeconomic History   Marital status: Divorced    Spouse name: Not on file   Number of children: 1   Years of education: Not on file   Highest education level: Not on file  Occupational History   Occupation: long distance driver    Comment: Retired   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, materials  Tobacco Use   Smoking status: Never   Smokeless tobacco: Never   Tobacco comments:    CIGARS ONLY--QUIT 2013  Vaping Use   Vaping Use: Never used  Substance and Sexual Activity   Alcohol use: Yes    Alcohol/week: 0.0 standard drinks of alcohol    Comment: rare//occassional   Drug use: No   Sexual activity: Yes  Other Topics Concern   Not on file  Social History Narrative   Has living will   Son is health care POA   Would accept resuscitation attempts   Likely wouldn't want extended tube feeds if cognitively unaware   Social Determinants of Health   Financial Resource Strain: Not on file  Food Insecurity: Not on file  Transportation Needs: Not on file  Physical Activity: Not on file  Stress: Not on file  Social Connections: Not on file  Intimate  Partner Violence: Not on file   Review of Systems No N/V Appetite is off some     Objective:   Physical Exam Constitutional:      Appearance: Normal appearance.  Cardiovascular:     Rate and  Rhythm: Rhythm irregular.     Comments: Rate at or just above 100 Pulmonary:     Effort: Pulmonary effort is normal.     Breath sounds: Normal breath sounds. No wheezing or rales.  Musculoskeletal:     Cervical back: Neck supple.     Right lower leg: No edema.     Left lower leg: No edema.  Lymphadenopathy:     Cervical: No cervical adenopathy.  Neurological:     Mental Status: He is alert.            Assessment & Plan:

## 2022-01-24 NOTE — Telephone Encounter (Addendum)
Per patient Dr. Alphonsus Sias started him on Eliquis on Friday 8/18 5 mg BID and he will follow up with him today, 8/21. Next appt with Dr. Mariah Milling is already scheduled on 10/16, no sooner appts at this time.  Educated about Afib, Afib RVR, Afib triggers, Eliquis use and NSAIDs, Eliquis and stroke prevention, and Kardia mobile.   Patient has no way to check vital signs at this time. During conversion it sounds like patient will get BP cuff and Kardia mobile for his home use going forward.   Will forward to Dr. Mariah Milling for advisement.

## 2022-01-24 NOTE — Assessment & Plan Note (Signed)
Persistent since last week Now on the eliquis Will start toprol 25mg  ---will cut the amlodipine dose if any dizziness Will contact Dr to see soon---should probably plan to try cardioversion

## 2022-01-26 ENCOUNTER — Encounter: Payer: Self-pay | Admitting: Medical

## 2022-01-26 ENCOUNTER — Other Ambulatory Visit
Admission: RE | Admit: 2022-01-26 | Discharge: 2022-01-26 | Disposition: A | Discharge status: Home / Self Care | Payer: Medicare Other | Source: Ambulatory Visit | Attending: Medical | Admitting: Medical

## 2022-01-26 ENCOUNTER — Ambulatory Visit: Payer: Medicare Other | Admitting: Medical

## 2022-01-26 VITALS — BP 100/62 | HR 109 | Ht 72.0 in | Wt 289.0 lb

## 2022-01-26 DIAGNOSIS — G4733 Obstructive sleep apnea (adult) (pediatric): Secondary | ICD-10-CM | POA: Diagnosis not present

## 2022-01-26 DIAGNOSIS — I4891 Unspecified atrial fibrillation: Secondary | ICD-10-CM | POA: Diagnosis not present

## 2022-01-26 LAB — BASIC METABOLIC PANEL
Anion gap: 9 (ref 5–15)
BUN: 21 mg/dL (ref 8–23)
CO2: 26 mmol/L (ref 22–32)
Calcium: 9.8 mg/dL (ref 8.9–10.3)
Chloride: 107 mmol/L (ref 98–111)
Creatinine, Ser: 1.18 mg/dL (ref 0.61–1.24)
GFR, Estimated: >60 mL/min (ref >60)
Glucose, Bld: 103 mg/dL — ABNORMAL HIGH (ref 70–99)
Potassium: 4.2 mmol/L (ref 3.5–5.1)
Sodium: 142 mmol/L (ref 135–145)

## 2022-01-26 LAB — TSH: TSH: 3.399 u[IU]/mL (ref 0.350–4.500)

## 2022-01-26 LAB — MAGNESIUM: Magnesium: 1.9 mg/dL (ref 1.7–2.4)

## 2022-01-26 MED ORDER — METOPROLOL SUCCINATE ER 50 MG PO TB24: 50.0000 mg | ORAL_TABLET | Freq: Every day | ORAL | 1 refills | Status: DC

## 2022-01-26 NOTE — Progress Notes (Signed)
Cardiology Office Note:    Date:  01/26/2022   ID:  Knowlton, Ennis 02-02-1951, MRN [id number]  PCP:  Venia Carbon, MD  [facility] Cardiologist:  Ida Rogue, MD  [facility] Electrophysiologist:  None   Referring MD: Venia Carbon, MD   Chief Complaint: New onset Afib  History of Present Illness:    Dakota Ellison is a 71 y.o. male with a hx of Obesity, OSA on CPAP, HTN, HLD, Coronary calcium score, no aortic atherosclerosis who presents for New on set Afib  H/o myalgias with statins. On Zetia  Last seen in 2021 and was stable from a cardiac perspective.   Saw PCP and was diagnosed with New onset Afib. EKG showed Afib 109bpm. Started on Toprol and Eliquis.  Today, the patient reports he has been feeling weak and hot with no energy. Afib diagnosis dicussed today. HE started Eliquis last Friday on the 18th. He is unsure he can tolerate being in afib. Long discussion surrounding cardioversion options. He denies chest pain, SOB, lightheadedness, dizziness, orthopnea or pnd.   Past Medical History:  Diagnosis Date   Anxiety state 0000000   denies   Complication of anesthesia    and trouble waking up   Degenerative disc disease    Dysthymia    denies   ED (erectile dysfunction)    GERD (gastroesophageal reflux disease)    History of MRSA infection    on lip from cut   Hyperlipemia    Hypertension    take BP med for small leak in heart to keep it small per pt.   Mild asthma    as a teen   Morbid obesity ([place])    OSA (obstructive sleep apnea)    Osteoarthritis    PONV (postoperative nausea and vomiting)    Rectal fissure    Testosterone deficiency     Past Surgical History:  Procedure Laterality Date   [place] SURGERY  09/2010   left knee and right knee arthroscopy   OTHER SURGICAL HISTORY     arthroscopic subacromial decompression   OTHER SURGICAL HISTORY     open resection, distal right clavicle   SHOULDER  ARTHROSCOPY     right   SPHINCTEROTOMY     TOTAL KNEE ARTHROPLASTY Left 06/11/2018   Procedure: LEFT TOTAL KNEE ARTHROPLASTY;  Surgeon: Gaynelle Arabian, MD;  Location: [facility];  Service: Orthopedics;  Laterality: Left;  59min   TOTAL KNEE ARTHROPLASTY Right 12/17/2018   Procedure: TOTAL KNEE ARTHROPLASTY;  Surgeon: Gaynelle Arabian, MD;  Location: [facility];  Service: Orthopedics;  Laterality: Right;  52min    Current Medications: Current Meds  Medication Sig   ALPRAZolam (XANAX) 0.5 MG tablet TAKE 1 TABLET BY MOUTH THREE TIMES DAILYAS NEEDED   amLODipine (NORVASC) 10 MG tablet TAKE 1 TABLET BY MOUTH DAILY   apixaban (ELIQUIS) 5 MG TABS tablet Take 1 tablet (5 mg total) by mouth 2 (two) times daily.   Apple Cider Vinegar 500 MG TABS Take 500 mg by mouth daily.    cetirizine (ZYRTEC) 10 MG tablet Take 10 mg by mouth daily.   Cholecalciferol (VITAMIN D) 50 MCG (2000 UT) CAPS Take 1 capsule by mouth daily.   CINNAMON PO Take 200 mg by mouth daily.   ezetimibe (ZETIA) 10 MG tablet TAKE 1 TABLET BY MOUTH DAILY   fluticasone (FLONASE) 50 MCG/ACT nasal spray Place 2 sprays into both nostrils in the morning and  at bedtime.   Garlic 2000 MG CAPS Take 2,000 mg by mouth 2 (two) times daily.    Ginkgo Biloba 120 MG CAPS Take 120 mg by mouth daily.   HYDROcodone-acetaminophen (NORCO/VICODIN) 5-325 MG tablet Take 1-2 tablets by mouth in the morning, at noon, and at bedtime.   loperamide (IMODIUM A-D) 2 MG tablet Take 2 tablets (4 mg total) by mouth 4 (four) times daily as needed for diarrhea or loose stools.   Multiple Vitamin (MULTIVITAMIN WITH MINERALS) TABS tablet Take 1 tablet by mouth daily.   niacin 500 MG CR capsule Take 500 mg by mouth 2 (two) times daily.   Omega-3 Fatty Acids (FISH OIL) 1200 MG CPDR Take 1,200 mg by mouth daily.   omeprazole (PRILOSEC) 20 MG capsule TAKE 1 CAPSULE BY MOUTH DAILY   sertraline (ZOLOFT) 25 MG tablet TAKE 1 TABLET BY MOUTH DAILY   sildenafil (VIAGRA) 100 MG tablet Take  as directed (Patient taking differently: Take 100 mg by mouth daily as needed for erectile dysfunction. Take as directed)   SYSTANE ULTRA 0.4-0.3 % SOLN Place 1 drop into both eyes 4 (four) times daily as needed (dry/irritated eyes.).   testosterone cypionate (DEPOTESTOSTERONE CYPIONATE) 200 MG/ML injection Inject 200 mg into the muscle every 14 (fourteen) days. Saturdays   triamcinolone (KENALOG) 0.1 % Apply 1 application topically 2 (two) times daily as needed.   Turmeric (QC TUMERIC COMPLEX PO) Take 500 mg by mouth daily.   valsartan-hydrochlorothiazide (DIOVAN-HCT) 320-25 MG tablet TAKE 1 TABLET BY MOUTH DAILY   vitamin C (ASCORBIC ACID) 500 MG tablet Take 500 mg by mouth daily.   zinc gluconate 50 MG tablet Take 50 mg by mouth daily.   [DISCONTINUED] metoprolol succinate (TOPROL-XL) 25 MG 24 hr tablet Take 1 tablet (25 mg total) by mouth daily.     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, materials  Tobacco Use   Smoking status: Never   Smokeless tobacco: Never   Tobacco comments:    CIGARS ONLY--QUIT 2013  Vaping Use   Vaping Use: Never used  Substance and Sexual Activity   Alcohol use: Yes    Alcohol/week: 0.0 standard drinks of alcohol    Comment: rare//occassional   Drug use: No   Sexual activity: Yes  Other Topics Concern   Not on file  Social History Narrative   Has living will   Son is health care POA   Would accept resuscitation attempts   Likely wouldn't want extended tube feeds if cognitively unaware   Social Determinants of Health   Financial Resource Strain: Not on file  Food  Insecurity: Not on file  Transportation Needs: Not on file  Physical Activity: Not on file  Stress: Not on file  Social Connections: Not on file     Family History: The patient's family history includes Pneumonia in his brother.  ROS:   Please see the history of present illness.     All other systems reviewed and are negative.  EKGs/Labs/Other Studies Reviewed:    The following studies were reviewed today:  Cardiac scoring 2019  IMPRESSION: Coronary calcium score of 0. This was 0 percentile for age and sex matched control.  EKG:  EKG is ordered today.  The ekg ordered today demonstrates Afib 109bpm, nonspecific T wave changes  Recent Labs: 01/19/2022: ALT 34; Hemoglobin 17.1; Platelets 265 01/26/2022: BUN 21; Creatinine, Ser 1.18; Magnesium 1.9; Potassium 4.2; Sodium 142; TSH 3.399  Recent Lipid Panel    Component Value Date/Time   CHOL 130 05/11/2021 1237   TRIG 150.0 (H) 05/11/2021 1237   HDL 35.20 (L) 05/11/2021 1237   CHOLHDL 4 05/11/2021 1237   VLDL 30.0 05/11/2021 1237   LDLCALC 64 05/11/2021 1237   LDLDIRECT 88.0 10/09/2017 1042    Physical Exam:    VS:  BP 100/62 (BP Location: Left Arm, Patient Position: Sitting, Cuff Size: Large)   Pulse (!) 109   Ht 6' (1.829 m)   Wt 289 lb (131.1 kg)   SpO2 96%   BMI 39.20 kg/m     Wt Readings from Last 3 Encounters:  01/26/22 289 lb (131.1 kg)  01/24/22 291 lb (132 kg)  11/15/21 291 lb (132 kg)     GEN:  Well nourished, well developed in no acute distress HEENT: Normal NECK: No JVD; No carotid bruits LYMPHATICS: No lymphadenopathy CARDIAC: Irreg Irreg, no murmurs, rubs, gallops RESPIRATORY:  Clear to auscultation without rales, wheezing or rhonchi  ABDOMEN: Soft, non-tender, non-distended MUSCULOSKELETAL:  No edema; No deformity  SKIN: Warm and dry NEUROLOGIC:  Alert and oriented x 3 PSYCHIATRIC:  Normal affect   ASSESSMENT:    1. Atrial fibrillation, unspecified type (HCC)   2. OSA (obstructive sleep  apnea)    PLAN:    In order of problems listed above:  New onset Afib EKG shows new onset afib RVR, initially found by his PCP and started on Eliquis and Toprol. Patient is feeling weak, hot and overall fatigued. He started Eliquis August 18th. He was started on Toprol 25mg  daily. Today is still in Afib with heart rate of 109bpm. After a long discussion plan for DCCV after september 18th (one month of anticoagulation). I will check BMET, TSH and Mag today. Stop amlodipine and increase Toprol to 50mg  daily. Continue Eliquis 5mg  BID. CHADSVASC of ?1 (says he doesn't have high BP, age x 1). Can discuss anticoagulation with MD at follow-up. BP is soft, I will stop amlodipine and increase Toprol to 50mg  daily.   OSA He reports compliance with CPAP.    Shared Decision Making/Informed Consent   Shared Decision Making/Informed Consent The risks (stroke, cardiac arrhythmias rarely resulting in the need for a temporary or permanent pacemaker, skin irritation or burns and complications associated with conscious sedation including aspiration, arrhythmia, respiratory failure and death), benefits (restoration of normal sinus rhythm) and alternatives of a direct current cardioversion were explained in detail to Mr. Nocera and he agrees to proceed.   Disposition: Follow up in 6 month(s) with MD/APP    Signed, Taejon Irani , PA-C  01/26/2022 7:13 PM    [facility]

## 2022-01-26 NOTE — Patient Instructions (Addendum)
Medication Instructions:  Your physician has recommended you make the following change in your medication:   STOP Amlodipine  INCREASE Metoprolol to 50 mg daily. An Rx has been sent to your pharmacy.   *If you need a refill on your cardiac medications before your next appointment, please call your pharmacy*   Lab Work: Bmp, Tsh, Mag today  Please have your labs drawn at the [facility] If you have labs (blood work) drawn today and your tests are completely normal, you will receive your results only by: MyChart Message (if you have MyChart) OR A paper copy in the mail If you have any lab test that is abnormal or we need to change your treatment, we will call you to review the results.   Testing/Procedures: Your physician has recommended that you have a Cardioversion (DCCV). Electrical Cardioversion uses a jolt of electricity to your heart either through paddles or wired patches attached to your chest. This is a controlled, usually prescheduled, procedure. Defibrillation is done under light anesthesia in the hospital, and you usually go home the day of the procedure. This is done to get your heart back into a normal rhythm. You are not awake for the procedure. Please see the instruction sheet given to you today.    Follow-Up: At [facility], you and your health needs are our priority.  As part of our continuing mission to provide you with exceptional heart care, we have created designated Provider Care Teams.  These Care Teams include your primary Cardiologist (physician) and Advanced Practice Providers (APPs -  Physician Assistants and Nurse Practitioners) who all work together to provide you with the care you need, when you need it.  We recommend signing up for the patient portal called "MyChart".  Sign up information is provided on this After Visit Summary.  MyChart is used to connect with patients for Virtual Visits (Telemedicine).  Patients are able to view lab/test results, encounter  notes, upcoming appointments, etc.  Non-urgent messages can be sent to your provider as well.   To learn more about what you can do with MyChart, go to [email or web address].    Your next appointment:   6 week(s)  The format for your next appointment:   In Person  Provider:   You may see [name], MD or one of the following Advanced Practice Providers on your designated Care Team:   Nicolasa Ducking, NP Eula Listen, PA-C Cadence Fransico Michael, PA-C{    Other Instructions You are scheduled for a Cardioversion on Thurs 02/24/22 with Dr. Mariah Milling. Please arrive at the [facility] at 6:30 a.m. on the day of your procedure.  DIET INSTRUCTIONS:  Nothing to eat or drink after midnight except your medications with a              sip of water.         Labs: Today (Bmp, Tsh, Mag)  Medications:  YOU MAY TAKE ALL of your remaining medications with a small amount of water.  Must have a responsible person to drive you home.  Bring a current list of your medications and current insurance cards.    If you have any questions after you get home, please call the office at [phone]   Important Information About Sugar

## 2022-01-26 NOTE — Telephone Encounter (Signed)
I called and spoke with the patient. I advised him that per Dr. Mariah Milling was aware of his episode of a-fib and advised that we bring him into the office to further assess next steps.  I confirmed with the patient that when he saw Dr. Alphonsus Sias on 01/24/22, he was still out of rhythm. He was already on Eliquis, but Dr. Alphonsus Sias placed him on toprol xl 25 mg once daily for rate control.  I advised the patient at this point, we need to bring him into the office to check his rate/ rhythm and determine next steps.  The patient voices understanding and is agreeable.   I have offered him an appointment with Cadence Furth, PA at 2:45 pm today. The patient advised he will be here for that appointment and was very appreciative of the call back.

## 2022-01-26 NOTE — Telephone Encounter (Signed)
Pt calling for an update on what to do next.

## 2022-02-05 ENCOUNTER — Other Ambulatory Visit: Payer: Self-pay | Admitting: Internal Medicine

## 2022-02-08 NOTE — Telephone Encounter (Signed)
Name of Medication: Hydrocodone apap 5-325 mg Name of Pharmacy: [facility] Last Fill or Written Date and Quantity: # 120 on 01/11/22 Last Office Visit and Type: 01/24/22  Next Office Visit and Type: 02/21/2022 for 3 mth FU Last Controlled Substance Agreement Date: 01/04/2021 Last UDS:01/07/2021

## 2022-02-18 ENCOUNTER — Telehealth: Payer: Self-pay | Admitting: Cardiology

## 2022-02-18 NOTE — Telephone Encounter (Signed)
I was notified by patient that he has been feeling "bad" while in afib. He has noticed some swelling and tingling in his feet, and they hurt to walk on causing him some balance issues. He denies any dizziness, syncope, or near syncope. Denies chest pain, but he does feel some mild discomfort in his chest. He has a hard time describing the chest discomfort, but he says that it just feels "weird". Denies feeling pressure over his chest.  He has discussed this odd sensation with his providers in office, and they agree that it is because of his afib. I told him to go to the ED if he has worsening chest discomfort or pain. He denies any excessive or spontaneous bleeding, bloody/black stools, or blood in his urine.   I explained that swelling in his feel can cause some pain and tingling. Told him to try to stay off his feet over the weekend, and to elevate his feet whenever possible. If he continues to have swelling on Monday, he can call the office for an appointment.   Patient asked what symptoms he should seek treatment for. I told him that if he experiences SOB while at rest, chest pain, syncope, bloody stools/urine or spontaneous bleeding, he should go to the ER for treatment.

## 2022-02-21 ENCOUNTER — Encounter: Payer: Self-pay | Admitting: Internal Medicine

## 2022-02-21 ENCOUNTER — Ambulatory Visit (INDEPENDENT_AMBULATORY_CARE_PROVIDER_SITE_OTHER): Payer: Medicare Other | Admitting: Internal Medicine

## 2022-02-21 ENCOUNTER — Telehealth: Payer: Self-pay | Admitting: Cardiovascular Disease

## 2022-02-21 VITALS — BP 124/76 | HR 88 | Temp 97.6°F | Ht 72.0 in | Wt 292.0 lb

## 2022-02-21 DIAGNOSIS — M48062 Spinal stenosis, lumbar region with neurogenic claudication: Secondary | ICD-10-CM

## 2022-02-21 DIAGNOSIS — F1120 Opioid dependence, uncomplicated: Secondary | ICD-10-CM | POA: Diagnosis not present

## 2022-02-21 DIAGNOSIS — I480 Paroxysmal atrial fibrillation: Secondary | ICD-10-CM

## 2022-02-21 NOTE — Telephone Encounter (Signed)
Patient states he is scheduled for cardioversion this Thursday, but his PCP said his heart sounded like it was back in rhythm. He says he also did an ekg and it shows he is back in rhythm. He says his PCP did not feel the cardioversion was necessary this Thursday, but does thinks he still needs an appointment as soon as possible because he still does not feel well. Patient states it is okay to call back tomorrow.

## 2022-02-21 NOTE — Assessment & Plan Note (Signed)
PDMP reviewed No concerns 

## 2022-02-21 NOTE — Telephone Encounter (Signed)
-----   Message from Minna Merritts, MD sent at 02/21/2022  4:26 PM EDT ----- Received message from Dr. Silvio Pate, EKG confirming he is back in normal sinus rhythm We can cancel his cardioversion scheduled for September 21 Thx TGollan

## 2022-02-21 NOTE — Progress Notes (Signed)
Subjective:    Patient ID: Dakota Ellison, male    DOB: 1950/10/07, 71 y.o.   MRN: [id number]  HPI Here for follow up of chronic back pain and narcotic dependence  Ongoing issues with the atrial fibrillation Still doesn't feel right Getting ready for cardioversion later this week Continues on eliquis Cardiologist increased the metoprolol  Has noted some dark pigment in mid upper toes  Balance off some still When on his feet all day at work--he had bad foot (arch) pain Had to miss work one day (3 days ago) Better after rest and tylenol  Back pain is about the same Continues on the hydrocodone 3-4 per day Did have more shots as well  Current Outpatient Medications on File Prior to Visit  Medication Sig Dispense Refill   ALPRAZolam (XANAX) 0.5 MG tablet TAKE 1 TABLET BY MOUTH THREE TIMES DAILYAS NEEDED 90 tablet 0   apixaban (ELIQUIS) 5 MG TABS tablet Take 1 tablet (5 mg total) by mouth 2 (two) times daily. 60 tablet 11   Apple Cider Vinegar 500 MG TABS Take 500 mg by mouth daily.      cetirizine (ZYRTEC) 10 MG tablet Take 10 mg by mouth daily.     Cholecalciferol (VITAMIN D) 50 MCG (2000 UT) CAPS Take 1 capsule by mouth daily.     CINNAMON PO Take 200 mg by mouth daily.     ezetimibe (ZETIA) 10 MG tablet TAKE 1 TABLET BY MOUTH DAILY 90 tablet 3   fluticasone (FLONASE) 50 MCG/ACT nasal spray Place 2 sprays into both nostrils in the morning and at bedtime. 16 g 0   Garlic 2000 MG CAPS Take 2,000 mg by mouth 2 (two) times daily.      Ginkgo Biloba 120 MG CAPS Take 120 mg by mouth daily.     HYDROcodone-acetaminophen (NORCO/VICODIN) 5-325 MG tablet Take 1-2 tablets by mouth in the morning, at noon, and at bedtime. 120 tablet 0   loperamide (IMODIUM A-D) 2 MG tablet Take 2 tablets (4 mg total) by mouth 4 (four) times daily as needed for diarrhea or loose stools. 30 tablet 0   metoprolol succinate (TOPROL-XL) 50 MG 24 hr tablet Take 1 tablet (50 mg total) by mouth daily. 90 tablet 1    Multiple Vitamin (MULTIVITAMIN WITH MINERALS) TABS tablet Take 1 tablet by mouth daily.     niacin 500 MG CR capsule Take 500 mg by mouth 2 (two) times daily.     Omega-3 Fatty Acids (FISH OIL) 1200 MG CPDR Take 1,200 mg by mouth daily.     omeprazole (PRILOSEC) 20 MG capsule TAKE 1 CAPSULE BY MOUTH DAILY 90 capsule 3   sertraline (ZOLOFT) 25 MG tablet TAKE 1 TABLET BY MOUTH DAILY 90 tablet 3   sildenafil (VIAGRA) 100 MG tablet Take as directed (Patient taking differently: Take 100 mg by mouth daily as needed for erectile dysfunction. Take as directed) 10 tablet 5   SYSTANE ULTRA 0.4-0.3 % SOLN Place 1 drop into both eyes 4 (four) times daily as needed (dry/irritated eyes.).     testosterone cypionate (DEPOTESTOSTERONE CYPIONATE) 200 MG/ML injection Inject 200 mg into the muscle every 14 (fourteen) days. Saturdays     triamcinolone (KENALOG) 0.1 % Apply 1 application topically 2 (two) times daily as needed. 45 g 1   Turmeric (QC TUMERIC COMPLEX PO) Take 500 mg by mouth daily.     valsartan-hydrochlorothiazide (DIOVAN-HCT) 320-25 MG tablet TAKE 1 TABLET BY MOUTH DAILY 90 tablet 3  vitamin C (ASCORBIC ACID) 500 MG tablet Take 500 mg by mouth daily.     zinc gluconate 50 MG tablet Take 50 mg by mouth daily.     amLODipine (NORVASC) 10 MG tablet TAKE 1 TABLET BY MOUTH DAILY (Patient not taking: Reported on 02/21/2022) 90 tablet 3   No current facility-administered medications on file prior to visit.    Allergies  Allergen Reactions   Atorvastatin Other (See Comments)    Muscle weakness in his legs   Crestor [Rosuvastatin] Other (See Comments)    Severe muscle cramping   Zocor [Simvastatin] Other (See Comments)    Muscle weakness in his legs    Past Medical History:  Diagnosis Date   Anxiety state 62/02/5283   denies   Complication of anesthesia    and trouble waking up   Degenerative disc disease    Dysthymia    denies   ED (erectile dysfunction)    GERD (gastroesophageal reflux  disease)    History of MRSA infection    on lip from cut   Hyperlipemia    Hypertension    take BP med for small leak in heart to keep it small per pt.   Mild asthma    as a teen   Morbid obesity ([place])    OSA (obstructive sleep apnea)    Osteoarthritis    PONV (postoperative nausea and vomiting)    Rectal fissure    Testosterone deficiency     Past Surgical History:  Procedure Laterality Date   [place] SURGERY  09/2010   left knee and right knee arthroscopy   OTHER SURGICAL HISTORY     arthroscopic subacromial decompression   OTHER SURGICAL HISTORY     open resection, distal right clavicle   SHOULDER ARTHROSCOPY     right   SPHINCTEROTOMY     TOTAL KNEE ARTHROPLASTY Left 06/11/2018   Procedure: LEFT TOTAL KNEE ARTHROPLASTY;  Surgeon: Gaynelle Arabian, MD;  Location: [facility];  Service: Orthopedics;  Laterality: Left;  21min   TOTAL KNEE ARTHROPLASTY Right 12/17/2018   Procedure: TOTAL KNEE ARTHROPLASTY;  Surgeon: Gaynelle Arabian, MD;  Location: [facility];  Service: Orthopedics;  Laterality: Right;  77min    Family History  Problem Relation Age of Onset   Pneumonia Brother     Social History   Socioeconomic History   Marital status: Divorced    Spouse name: Not on file   Number of children: 1   Years of education: Not on file   Highest education level: Not on file  Occupational History   Occupation: long distance driver    Comment: Retired   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)  Tobacco Use   Smoking status: Never   Smokeless tobacco: Never   Tobacco comments:    CIGARS ONLY--QUIT 2013  Vaping Use   Vaping Use: Never used  Substance and Sexual Activity   Alcohol use: Yes    Alcohol/week: 0.0 standard drinks of alcohol    Comment: rare//occassional   Drug use: No   Sexual activity: Yes  Other Topics Concern   Not on file  Social History Narrative   Has living will   Son is health care POA   Would  accept resuscitation attempts   Likely wouldn't want extended tube feeds if cognitively unaware   Social Determinants of Health   Financial Resource Strain: Not on file  Food Insecurity: Not on  file  Transportation Needs: Not on file  Physical Activity: Not on file  Stress: Not on file  Social Connections: Not on file  Intimate Partner Violence: Not on file   Review of Systems Has been sweating more    Objective:   Physical Exam Constitutional:      Appearance: Normal appearance.  Cardiovascular:     Rate and Rhythm: Normal rate and regular rhythm.     Pulses: Normal pulses.     Heart sounds: No murmur heard.    No gallop.  Musculoskeletal:     Cervical back: Neck supple.     Right lower leg: No edema.     Left lower leg: No edema.  Lymphadenopathy:     Cervical: No cervical adenopathy.  Neurological:     Mental Status: He is alert.            Assessment & Plan:

## 2022-02-21 NOTE — Assessment & Plan Note (Signed)
He is interested in medication Will check with his insurance about wegovy

## 2022-02-21 NOTE — Assessment & Plan Note (Addendum)
Due for cardioversion but sounds regular today Will check EKG EKG does show sinus rhythm--will let cardiology know Continue the metoprolol/eliquis

## 2022-02-21 NOTE — Assessment & Plan Note (Signed)
Ongoing chronic pain Uses the hydrocodone 3-4 per day Injections Still able to work with all this

## 2022-02-21 NOTE — Telephone Encounter (Signed)
Patient stated his pcp did an ekg today and he is not in afib. He is scheduled for cardioversion on on 9/21. Patient's new meds are eliquis 5mg  twice daily and Toprol 50mg  daily. Patient wasn't to know if he can cut Toprol in half due to ED. Please advise on cardioversion and Toprol dose.

## 2022-02-22 ENCOUNTER — Telehealth: Payer: Self-pay | Admitting: Internal Medicine

## 2022-02-22 MED ORDER — WEGOVY 0.25 MG/0.5ML ~~LOC~~ SOAJ: 0.2500 mg | SUBCUTANEOUS | 1 refills | Status: DC

## 2022-02-22 MED ORDER — OZEMPIC (0.25 OR 0.5 MG/DOSE) 2 MG/3ML ~~LOC~~ SOPN: 0.2500 mg | PEN_INJECTOR | SUBCUTANEOUS | 1 refills | Status: DC

## 2022-02-22 NOTE — Telephone Encounter (Signed)
Spoke to pt. Advised I did send the [facility] to [facility] as requested.

## 2022-02-22 NOTE — Telephone Encounter (Signed)
Patient called in stating he reached out to his insurance company regarding [place] and [place]. He stated that Ozempic would be better for him because it cost less per month. His insurance company [employer] stated to send over to them prior to sending it to the pharmacy to get approval for weight loss. Thank you!

## 2022-02-22 NOTE — Telephone Encounter (Signed)
Darryl Nestle routed conversation to You 37 minutes ago (1:51 PM)   Darryl Nestle 37 minutes ago (1:50 PM)   [facility] Patient called in asking can his [facility]em[facility] [place] ,due to [facility] pharmacy [place] not being able to fill it for him.      Note    Shae, [place] [phone]  Darryl Nestle

## 2022-02-22 NOTE — Addendum Note (Signed)
Addended by: Viviana Simpler I on: 02/22/2022 01:25 PM   Modules accepted: Orders

## 2022-02-22 NOTE — Telephone Encounter (Signed)
Called and spoke with patient. Reviewed the following message from Dr. Rockey Situ:  Would not stop the Eliquis  Can we find out what blood pressure heart rate is running before stopping/cutting the metoprolol  It is possible if we cut the metoprolol too low he will go back into A-fib  Thx  TG   Patients VS at Dr. Alla German office yesterday were: BP 124/76, HR 88.  With patients HR at 88 on current dose of Metoprolol Succinate 50 MG daily, I advised to stay on this dose per Dr. Donivan Scull message. Patient agreed and understood. He will also continue to take his Eliquis 2X a day. He has a follow up appointment with Dr. Rockey Situ on 03/21/22. He stated that Dr. Silvio Pate thought he should be seen sooner. I informed him that as long as he is feeling okay and taking his medications as prescribed, that we can keep his apt for 03/21/22. Advised that if he experiences any extreme fatigue, shortness of breath, or palpitations that he give [place] a call and we could get him in sooner. Patient agreed with plan and was very grateful for the follow up.   Called scheduling and spoke with Pamala Hurry and cancelled patients cardioversion for 02/24/22.

## 2022-02-22 NOTE — Telephone Encounter (Signed)
Copied this info to other note already open.

## 2022-02-22 NOTE — Addendum Note (Signed)
Addended by: Pilar Grammes on: 02/22/2022 02:46 PM   Modules accepted: Orders

## 2022-02-22 NOTE — Telephone Encounter (Signed)
We have already had issues with [employer] not covering Ozempic for weight loss if he is not Diabetic. He will have to do [facility].

## 2022-02-22 NOTE — Telephone Encounter (Signed)
I sent to the wegovy to his pharmacy. If he is able to fill it and does okay--have him let me know after 4 weekly doses and we can consider increasing the dose

## 2022-02-22 NOTE — Telephone Encounter (Signed)
Patient called in asking can his Ozempic rx [facility] [place] ,due to the pharmacy [place] not being able to fill it for him.

## 2022-02-22 NOTE — Addendum Note (Signed)
Addended by: Pilar Grammes on: 02/22/2022 02:29 PM   Modules accepted: Orders

## 2022-02-23 LAB — DRUG MONITORING, PANEL 8 WITH CONFIRMATION, URINE
6 Acetylmorphine: NEGATIVE ng/mL (ref <10)
Alcohol Metabolites: NEGATIVE ng/mL (ref <500)
Alphahydroxyalprazolam: 65 ng/mL — ABNORMAL HIGH (ref <25)
Alphahydroxymidazolam: NEGATIVE ng/mL (ref <50)
Alphahydroxytriazolam: NEGATIVE ng/mL (ref <50)
Aminoclonazepam: NEGATIVE ng/mL (ref <25)
Amphetamines: NEGATIVE ng/mL (ref <500)
Benzodiazepines: POSITIVE ng/mL — AB (ref <100)
Buprenorphine, Urine: NEGATIVE ng/mL (ref <5)
Cocaine Metabolite: NEGATIVE ng/mL (ref <150)
Codeine: NEGATIVE ng/mL (ref <50)
Creatinine: 230.6 mg/dL (ref >=20.0)
Hydrocodone: 1432 ng/mL — ABNORMAL HIGH (ref <50)
Hydromorphone: 179 ng/mL — ABNORMAL HIGH (ref <50)
Hydroxyethylflurazepam: NEGATIVE ng/mL (ref <50)
Lorazepam: NEGATIVE ng/mL (ref <50)
MDMA: NEGATIVE ng/mL (ref <500)
Marijuana Metabolite: NEGATIVE ng/mL (ref <20)
Morphine: NEGATIVE ng/mL (ref <50)
Nordiazepam: NEGATIVE ng/mL (ref <50)
Norhydrocodone: 1054 ng/mL — ABNORMAL HIGH (ref <50)
Opiates: POSITIVE ng/mL — AB (ref <100)
Oxazepam: NEGATIVE ng/mL (ref <50)
Oxidant: NEGATIVE ug/mL (ref <200)
Oxycodone: NEGATIVE ng/mL (ref <100)
Temazepam: NEGATIVE ng/mL (ref <50)
pH: 5.2 (ref 4.5–9.0)

## 2022-02-23 LAB — DM TEMPLATE

## 2022-02-24 ENCOUNTER — Encounter: Admission: RE | Payer: Self-pay | Source: Home / Self Care

## 2022-02-24 ENCOUNTER — Ambulatory Visit: Admission: RE | Admit: 2022-02-24 | Payer: Medicare Other | Source: Home / Self Care | Admitting: Cardiovascular Disease

## 2022-02-24 SURGERY — CARDIOVERSION
Anesthesia: General

## 2022-03-03 ENCOUNTER — Other Ambulatory Visit: Payer: Self-pay | Admitting: Internal Medicine

## 2022-03-03 NOTE — Telephone Encounter (Signed)
Name of Medication: Hydrocodone apap 5-325 mg Name of Pharmacy: [facility] Date and Quantity: 02-08-22 # 120 Last Office Visit and Type: 02-21-22 Next Office Visit and Type: 05-16-22 Last Controlled Substance Agreement Date: 02/21/22 Last UDS: 02/21/22
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
## 2022-03-21 ENCOUNTER — Ambulatory Visit: Payer: Medicare Other | Attending: Cardiovascular Disease | Admitting: Cardiovascular Disease

## 2022-03-21 ENCOUNTER — Encounter: Payer: Self-pay | Admitting: Cardiovascular Disease

## 2022-03-21 VITALS — BP 112/70 | HR 79 | Ht 72.0 in | Wt 291.2 lb

## 2022-03-21 DIAGNOSIS — I4819 Other persistent atrial fibrillation: Secondary | ICD-10-CM | POA: Diagnosis not present

## 2022-03-21 DIAGNOSIS — M791 Myalgia, unspecified site: Secondary | ICD-10-CM

## 2022-03-21 DIAGNOSIS — I10 Essential (primary) hypertension: Secondary | ICD-10-CM

## 2022-03-21 DIAGNOSIS — R0789 Other chest pain: Secondary | ICD-10-CM

## 2022-03-21 DIAGNOSIS — T466X5A Adverse effect of antihyperlipidemic and antiarteriosclerotic drugs, initial encounter: Secondary | ICD-10-CM

## 2022-03-21 DIAGNOSIS — G4733 Obstructive sleep apnea (adult) (pediatric): Secondary | ICD-10-CM

## 2022-03-21 DIAGNOSIS — E782 Mixed hyperlipidemia: Secondary | ICD-10-CM

## 2022-03-21 NOTE — Patient Instructions (Addendum)
Look for pulse oximeter or cardiomobile  Medication Instructions:  No changes  If you need a refill on your cardiac medications before your next appointment, please call your pharmacy.   Lab work: No new labs needed  Testing/Procedures: No new testing needed  Follow-Up: At [facility], you and your health needs are our priority.  As part of our continuing mission to provide you with exceptional heart care, we have created designated Provider Care Teams.  These Care Teams include your primary Cardiologist (physician) and Advanced Practice Providers (APPs -  Physician Assistants and Nurse Practitioners) who all work together to provide you with the care you need, when you need it.  You will need a follow up appointment in 12 months  Providers on your designated Care Team:   Murray Hodgkins, NP Christell Faith, PA-C Cadence Kathlen Mody, [place]  COVID-19 Vaccine Information can be found at: [email or web address] For questions related to vaccine distribution or appointments, please email [email or web address] or call [phone].

## 2022-03-23 ENCOUNTER — Telehealth: Payer: Self-pay | Admitting: Cardiovascular Disease

## 2022-03-23 NOTE — Telephone Encounter (Signed)
I spoke with the patient and advised him that any basic pulse oximeter is fine- he can obtain this from the drug store and it will show his O2 sat & HR.  I advised the kardio mobile app is available with multiple leads (3, 6, etc), but he would only need a 3 lead or most basic.  The patient voices understanding and is agreeable.

## 2022-03-23 NOTE — Telephone Encounter (Signed)
Pt called stating he was told to look for a pulse oximeter or cardiomobile at his last visit and wants to know exactly what he should buy (brand and function). Please advise

## 2022-03-28 DIAGNOSIS — M5416 Radiculopathy, lumbar region: Secondary | ICD-10-CM | POA: Diagnosis not present

## 2022-03-29 ENCOUNTER — Telehealth: Payer: Self-pay | Admitting: Internal Medicine

## 2022-03-29 NOTE — Telephone Encounter (Signed)
Left message on VM advising pt of dates of his pneumonia vaccines and that he did not require any more.

## 2022-03-29 NOTE — Telephone Encounter (Signed)
Pt called in requesting a call back stated he would like to know when he had his last pneumonia shot and if he needs one . Please advise [phone]

## 2022-03-31 ENCOUNTER — Other Ambulatory Visit: Payer: Self-pay | Admitting: Internal Medicine

## 2022-03-31 NOTE — Telephone Encounter (Signed)
Name of Medication: Hydrocodone apap 5-325 mg Name of Pharmacy: [facility] Date and Quantity: 03-03-22 # 120 Last Office Visit and Type: 02-21-22 Next Office Visit and Type: 05-16-22 Last Controlled Substance Agreement Date: 02/21/22 Last UDS: 02/21/22

## 2022-04-06 ENCOUNTER — Other Ambulatory Visit: Payer: Self-pay | Admitting: Internal Medicine

## 2022-04-06 NOTE — Telephone Encounter (Signed)
Last filled 01-07-22 #90 Last OV 02-21-22 Next OV 05-16-22 [facility]

## 2022-04-21 DIAGNOSIS — G4733 Obstructive sleep apnea (adult) (pediatric): Secondary | ICD-10-CM | POA: Diagnosis not present

## 2022-04-26 ENCOUNTER — Other Ambulatory Visit: Payer: Self-pay | Admitting: Internal Medicine

## 2022-04-26 NOTE — Telephone Encounter (Signed)
Name of Medication: Hydrocodone apap 5-325 mg Name of Pharmacy: [facility] Last Fill or Written Date and Quantity: 03-31-22 # 120 Last Office Visit and Type: 02-21-22 Next Office Visit and Type: 05-16-22 Last Controlled Substance Agreement Date: 02/21/22 Last UDS: 02/21/22

## 2022-05-02 DIAGNOSIS — E291 Testicular hypofunction: Secondary | ICD-10-CM | POA: Diagnosis not present

## 2022-05-02 DIAGNOSIS — R948 Abnormal results of function studies of other organs and systems: Secondary | ICD-10-CM | POA: Diagnosis not present

## 2022-05-09 DIAGNOSIS — R3915 Urgency of urination: Secondary | ICD-10-CM | POA: Diagnosis not present

## 2022-05-09 DIAGNOSIS — N5201 Erectile dysfunction due to arterial insufficiency: Secondary | ICD-10-CM | POA: Diagnosis not present

## 2022-05-09 DIAGNOSIS — R351 Nocturia: Secondary | ICD-10-CM | POA: Diagnosis not present

## 2022-05-09 DIAGNOSIS — N401 Enlarged prostate with lower urinary tract symptoms: Secondary | ICD-10-CM | POA: Diagnosis not present

## 2022-05-16 ENCOUNTER — Encounter: Payer: Self-pay | Admitting: Internal Medicine

## 2022-05-16 ENCOUNTER — Ambulatory Visit (INDEPENDENT_AMBULATORY_CARE_PROVIDER_SITE_OTHER): Payer: Medicare Other | Admitting: Internal Medicine

## 2022-05-16 VITALS — BP 100/68 | HR 73 | Temp 97.4°F | Ht 72.0 in | Wt 290.0 lb

## 2022-05-16 DIAGNOSIS — F39 Unspecified mood [affective] disorder: Secondary | ICD-10-CM

## 2022-05-16 DIAGNOSIS — F1120 Opioid dependence, uncomplicated: Secondary | ICD-10-CM | POA: Diagnosis not present

## 2022-05-16 DIAGNOSIS — M48061 Spinal stenosis, lumbar region without neurogenic claudication: Secondary | ICD-10-CM

## 2022-05-16 NOTE — Assessment & Plan Note (Signed)
Mood has been okay Uses the xanax every morning--helps him stay level (will get panicky about doing what he needs to do for the day)

## 2022-05-16 NOTE — Progress Notes (Signed)
Subjective:    Patient ID: Dakota Ellison, male    DOB: 03-Aug-1950, 71 y.o.   MRN: [id number]  HPI Here for follow up of chronic pain and narcotic dependence  Back pain is about the same Last injection in the back a couple of months ago--it does help, but not for that long Still using hydrocodone 4 times a day  Tries to stay active Has cut back on the sugared soda Doesn't eat out that often  Current Outpatient Medications on File Prior to Visit  Medication Sig Dispense Refill   ALPRAZolam (XANAX) 0.5 MG tablet TAKE 1 TABLET BY MOUTH THREE TIMES DAILY AS NEEDED 90 tablet 0   apixaban (ELIQUIS) 5 MG TABS tablet Take 1 tablet (5 mg total) by mouth 2 (two) times daily. 60 tablet 11   Apple Cider Vinegar 500 MG TABS Take 500 mg by mouth daily.      cetirizine (ZYRTEC) 10 MG tablet Take 10 mg by mouth daily.     Cholecalciferol (VITAMIN D) 50 MCG (2000 UT) CAPS Take 1 capsule by mouth daily.     CINNAMON PO Take 200 mg by mouth daily.     ezetimibe (ZETIA) 10 MG tablet TAKE 1 TABLET BY MOUTH DAILY 90 tablet 3   fluticasone (FLONASE) 50 MCG/ACT nasal spray Place 2 sprays into both nostrils in the morning and at bedtime. 16 g 0   Garlic [id number] MG CAPS Take 2,000 mg by mouth 2 (two) times daily.      Ginkgo Biloba 120 MG CAPS Take 120 mg by mouth daily.     HYDROcodone-acetaminophen (NORCO/VICODIN) 5-325 MG tablet TAKE 1-2 TABLETS BY MOUTH IN THE MORNING, AT NOON, AND AT BEDTIME 120 tablet 0   loperamide (IMODIUM A-D) 2 MG tablet Take 2 tablets (4 mg total) by mouth 4 (four) times daily as needed for diarrhea or loose stools. 30 tablet 0   metoprolol succinate (TOPROL-XL) 50 MG 24 hr tablet Take 1 tablet (50 mg total) by mouth daily. 90 tablet 1   Multiple Vitamin (MULTIVITAMIN WITH MINERALS) TABS tablet Take 1 tablet by mouth daily.     niacin 500 MG CR capsule Take 500 mg by mouth 2 (two) times daily.     Omega-3 Fatty Acids (FISH OIL) 1200 MG CPDR Take 1,200 mg by mouth daily.      omeprazole (PRILOSEC) 20 MG capsule TAKE 1 CAPSULE BY MOUTH DAILY 90 capsule 3   sertraline (ZOLOFT) 25 MG tablet TAKE 1 TABLET BY MOUTH DAILY 90 tablet 3   sildenafil (VIAGRA) 100 MG tablet Take as directed (Patient taking differently: Take 100 mg by mouth daily as needed for erectile dysfunction. Take as directed) 10 tablet 5   SYSTANE ULTRA 0.4-0.3 % SOLN Place 1 drop into both eyes 4 (four) times daily as needed (dry/irritated eyes.).     testosterone cypionate (DEPOTESTOSTERONE CYPIONATE) 200 MG/ML injection Inject 200 mg into the muscle every 14 (fourteen) days. Saturdays     triamcinolone (KENALOG) 0.1 % Apply 1 application topically 2 (two) times daily as needed. 45 g 1   Turmeric (QC TUMERIC COMPLEX PO) Take 500 mg by mouth daily.     valsartan-hydrochlorothiazide (DIOVAN-HCT) 320-25 MG tablet TAKE 1 TABLET BY MOUTH DAILY 90 tablet 3   vitamin C (ASCORBIC ACID) 500 MG tablet Take 500 mg by mouth daily.     zinc gluconate 50 MG tablet Take 50 mg by mouth daily.     No current facility-administered medications on file prior  to visit.    Allergies  Allergen Reactions   Atorvastatin Other (See Comments)    Muscle weakness in his legs   Crestor [Rosuvastatin] Other (See Comments)    Severe muscle cramping   Zocor [Simvastatin] Other (See Comments)    Muscle weakness in his legs   Atorvastatin Calcium Other (See Comments)    Past Medical History:  Diagnosis Date   Anxiety state 0000000   denies   Complication of anesthesia    and trouble waking up   Degenerative disc disease    Dysthymia    denies   ED (erectile dysfunction)    GERD (gastroesophageal reflux disease)    History of MRSA infection    on lip from cut   Hyperlipemia    Hypertension    take BP med for small leak in heart to keep it small per pt.   Mild asthma    as a teen   Morbid obesity ([place])    OSA (obstructive sleep apnea)    Osteoarthritis    PONV (postoperative nausea and vomiting)    Rectal  fissure    Testosterone deficiency     Past Surgical History:  Procedure Laterality Date   [place] SURGERY  09/2010   left knee and right knee arthroscopy   OTHER SURGICAL HISTORY     arthroscopic subacromial decompression   OTHER SURGICAL HISTORY     open resection, distal right clavicle   SHOULDER ARTHROSCOPY     right   SPHINCTEROTOMY     TOTAL KNEE ARTHROPLASTY Left 06/11/2018   Procedure: LEFT TOTAL KNEE ARTHROPLASTY;  Surgeon: Gaynelle Arabian, MD;  Location: [facility];  Service: Orthopedics;  Laterality: Left;  20min   TOTAL KNEE ARTHROPLASTY Right 12/17/2018   Procedure: TOTAL KNEE ARTHROPLASTY;  Surgeon: Gaynelle Arabian, MD;  Location: [facility];  Service: Orthopedics;  Laterality: Right;  61min    Family History  Problem Relation Age of Onset   Pneumonia Brother     Social History   Socioeconomic History   Marital status: Divorced    Spouse name: Not on file   Number of children: 1   Years of education: Not on file   Highest education level: Not on file  Occupational History   Occupation: long distance driver    Comment: Retired   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)  Tobacco Use   Smoking status: Never   Smokeless tobacco: Never   Tobacco comments:    CIGARS ONLY--QUIT 2013  Vaping Use   Vaping Use: Never used  Substance and Sexual Activity   Alcohol use: Yes    Alcohol/week: 0.0 standard drinks of alcohol    Comment: rare//occassional   Drug use: No   Sexual activity: Yes  Other Topics Concern   Not on file  Social History Narrative   Has living will   Son is health care POA   Would accept resuscitation attempts   Likely wouldn't want extended tube feeds if cognitively unaware   Social Determinants of Health   Financial Resource Strain: Not on file  Food Insecurity: Not on file  Transportation Needs: Not on file  Physical Activity: Not on file  Stress: Not on file  Social Connections: Not  on file  Intimate Partner Violence: Not on file   Review of Systems Sleep okay with CPAP Never started the wegovy--insurance wouldn't cover it    Objective:   Physical  Exam Constitutional:      Appearance: Normal appearance.  Neurological:     Mental Status: He is alert.  Psychiatric:        Mood and Affect: Mood normal.        Behavior: Behavior normal.            Assessment & Plan:

## 2022-05-16 NOTE — Assessment & Plan Note (Signed)
Chronic pain Has had ESI which gives some relief Continues on the hydrocodone 5/325 four times a day

## 2022-05-16 NOTE — Assessment & Plan Note (Signed)
PDMP reviewed No concerns 

## 2022-05-21 DIAGNOSIS — G4733 Obstructive sleep apnea (adult) (pediatric): Secondary | ICD-10-CM | POA: Diagnosis not present

## 2022-06-03 ENCOUNTER — Other Ambulatory Visit: Payer: Self-pay | Admitting: Internal Medicine

## 2022-06-03 NOTE — Telephone Encounter (Signed)
I actually just got off the phone with the pharmacy stating the directions we put are only 20 days if he takes 6 daily. He said he takes up to 4 daily. The directions read 1-2 tabs 3 times daily as needed. I said 4 x 30 = 120. That is what we have been writing #120. Im hoping they do not give him a hard time. We may need to change the directions to 4 times daily to help them make it work.

## 2022-06-03 NOTE — Telephone Encounter (Signed)
Patient called in stating that medication hydrocodone is suppose to be filled for 120 pills every 30 days. When he went to the pharmacy they had it filled for 90 pills ,which has been happening every month now. He would like to know if the correct prescription can be called in.  [facility] - [place], [place] - [address] Phone: [phone]  Fax: [phone]

## 2022-06-03 NOTE — Telephone Encounter (Signed)
Name of Medication: Hydrocodone apap 5-325 mg Name of Pharmacy: [facility] Last Fill or Written Date and Quantity: 04-26-22 # 120 Last Office Visit and Type: 05-16-22 Next Office Visit and Type: 08-15-22 Last Controlled Substance Agreement Date: 02/21/22 Last UDS: 02/21/22

## 2022-06-06 MED ORDER — HYDROCODONE-ACETAMINOPHEN 5-325 MG PO TABS: 1.0000 | ORAL_TABLET | Freq: Four times a day (QID) | ORAL | 0 refills | Status: DC | PRN

## 2022-06-07 NOTE — Telephone Encounter (Signed)
Rx sent yesterday. I cannot refuse controlled substance

## 2022-06-27 DIAGNOSIS — G8929 Other chronic pain: Secondary | ICD-10-CM | POA: Diagnosis not present

## 2022-06-27 DIAGNOSIS — M5416 Radiculopathy, lumbar region: Secondary | ICD-10-CM | POA: Diagnosis not present

## 2022-06-27 DIAGNOSIS — M545 Low back pain, unspecified: Secondary | ICD-10-CM | POA: Diagnosis not present

## 2022-06-27 DIAGNOSIS — M48061 Spinal stenosis, lumbar region without neurogenic claudication: Secondary | ICD-10-CM | POA: Diagnosis not present

## 2022-07-02 ENCOUNTER — Other Ambulatory Visit: Payer: Self-pay | Admitting: Internal Medicine

## 2022-07-04 NOTE — Telephone Encounter (Signed)
Last office visit 05/16/22 for  Spinal Stenosis, Chronic Narcotic dependence and mood disorder. Last refilled Alprazolam 04/06/22 for #90 with no refills.  Norco 06/06/2022 for #120 with no refills.  UDS/Contract 02/21/22.  Next Appt: 08/15/22 for CPE.

## 2022-08-01 ENCOUNTER — Other Ambulatory Visit: Payer: Self-pay | Admitting: Internal Medicine

## 2022-08-01 NOTE — Telephone Encounter (Signed)
     Name of Medication: Hydrocodone apap 5-325 mg Name of Pharmacy: [facility] Date and Quantity: 07-04-22 # 120 Last Office Visit and Type: 05-16-22 Next Office Visit and Type: 08-15-22 Last Controlled Substance Agreement Date: 02/21/22 Last UDS: 02/21/22

## 2022-08-01 NOTE — Telephone Encounter (Signed)
Please check this tomorrow

## 2022-08-02 NOTE — Telephone Encounter (Signed)
Spoke to pharmacy. They received the prescription.

## 2022-08-10 ENCOUNTER — Telehealth: Payer: Self-pay | Admitting: Internal Medicine

## 2022-08-10 NOTE — Telephone Encounter (Signed)
Spoke to pt. Advised him he would need to be seen. Offered an OV tomorrow but he can't do that. I gave him the info about the [facility] on [facility]

## 2022-08-10 NOTE — Telephone Encounter (Signed)
Pt called stating he has his cpe with [place] on 3/11 but has been experiencing a bad cough. Pt is asking can something be prescribed or does he have to wait until appt on Monday? Preferred pharmacy is [facility], [place]. Call back # [phone]

## 2022-08-15 ENCOUNTER — Ambulatory Visit (INDEPENDENT_AMBULATORY_CARE_PROVIDER_SITE_OTHER): Payer: Medicare Other | Admitting: Internal Medicine

## 2022-08-15 ENCOUNTER — Encounter: Payer: Self-pay | Admitting: Internal Medicine

## 2022-08-15 ENCOUNTER — Telehealth: Payer: Self-pay | Admitting: Cardiovascular Disease

## 2022-08-15 VITALS — BP 138/88 | HR 84 | Temp 97.6°F | Ht 70.5 in | Wt 289.0 lb

## 2022-08-15 DIAGNOSIS — F39 Unspecified mood [affective] disorder: Secondary | ICD-10-CM

## 2022-08-15 DIAGNOSIS — M48061 Spinal stenosis, lumbar region without neurogenic claudication: Secondary | ICD-10-CM

## 2022-08-15 DIAGNOSIS — Z0000 Encounter for general adult medical examination without abnormal findings: Secondary | ICD-10-CM | POA: Diagnosis not present

## 2022-08-15 DIAGNOSIS — I10 Essential (primary) hypertension: Secondary | ICD-10-CM | POA: Diagnosis not present

## 2022-08-15 DIAGNOSIS — R7302 Impaired glucose tolerance (oral): Secondary | ICD-10-CM | POA: Diagnosis not present

## 2022-08-15 DIAGNOSIS — F1120 Opioid dependence, uncomplicated: Secondary | ICD-10-CM

## 2022-08-15 DIAGNOSIS — I480 Paroxysmal atrial fibrillation: Secondary | ICD-10-CM | POA: Diagnosis not present

## 2022-08-15 LAB — COMPREHENSIVE METABOLIC PANEL
ALT: 30 U/L (ref 0–53)
AST: 26 U/L (ref 0–37)
Albumin: 4.1 g/dL (ref 3.5–5.2)
Alkaline Phosphatase: 75 U/L (ref 39–117)
BUN: 14 mg/dL (ref 6–23)
CO2: 27 mEq/L (ref 19–32)
Calcium: 9.9 mg/dL (ref 8.4–10.5)
Chloride: 99 mEq/L (ref 96–112)
Creatinine, Ser: 1.02 mg/dL (ref 0.40–1.50)
GFR: 74.26 mL/min (ref >60.00)
Glucose, Bld: 98 mg/dL (ref 70–99)
Potassium: 3.9 mEq/L (ref 3.5–5.1)
Sodium: 137 mEq/L (ref 135–145)
Total Bilirubin: 0.5 mg/dL (ref 0.2–1.2)
Total Protein: 7.2 g/dL (ref 6.0–8.3)

## 2022-08-15 LAB — TSH: TSH: 5.99 u[IU]/mL — ABNORMAL HIGH (ref 0.35–5.50)

## 2022-08-15 LAB — CBC
HCT: 48.6 % (ref 39.0–52.0)
Hemoglobin: 16.7 g/dL (ref 13.0–17.0)
MCHC: 34.4 g/dL (ref 30.0–36.0)
MCV: 86.3 fl (ref 78.0–100.0)
Platelets: 281 10*3/uL (ref 150.0–400.0)
RBC: 5.63 Mil/uL (ref 4.22–5.81)
RDW: 13.9 % (ref 11.5–15.5)
WBC: 10.5 10*3/uL (ref 4.0–10.5)

## 2022-08-15 LAB — LIPID PANEL
Cholesterol: 138 mg/dL (ref 0–200)
HDL: 31.3 mg/dL — ABNORMAL LOW (ref >39.00)
Total CHOL/HDL Ratio: 4
Triglycerides: 486 mg/dL — ABNORMAL HIGH (ref 0.0–149.0)

## 2022-08-15 LAB — LDL CHOLESTEROL, DIRECT: Direct LDL: 52 mg/dL

## 2022-08-15 LAB — HEMOGLOBIN A1C: Hgb A1c MFr Bld: 6.5 % (ref 4.6–6.5)

## 2022-08-15 MED ORDER — AMOXICILLIN-POT CLAVULANATE 875-125 MG PO TABS: 1.0000 | ORAL_TABLET | Freq: Two times a day (BID) | ORAL | 0 refills | Status: DC

## 2022-08-15 NOTE — Assessment & Plan Note (Signed)
Regular again On eliquis 5 bid and metoprolol 50 daily

## 2022-08-15 NOTE — Progress Notes (Signed)
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

## 2022-08-15 NOTE — Telephone Encounter (Signed)
Called and spoke with Anne Ng from [facility]. She stated that the patient will be having a root cancel or one tooth extracted. The patient was concerned about the use of epinephrine for the procedure due to his afib. They were calling to see if this could be used as it is a small percentage.

## 2022-08-15 NOTE — Telephone Encounter (Signed)
Caller wants to know if patient should not have epinephrine.  Caller states patient will need this medication for an upcoming procedure.

## 2022-08-15 NOTE — Assessment & Plan Note (Signed)
BP Readings from Last 3 Encounters:  08/15/22 138/88  05/16/22 100/68  03/21/22 112/70   Fine on valsartan/HCTZ 320/25 and metoprolol

## 2022-08-15 NOTE — Assessment & Plan Note (Signed)
Will check labs

## 2022-08-15 NOTE — Progress Notes (Signed)
Subjective:    Patient ID: Dakota Ellison, male    DOB: 1950/07/29, 72 y.o.   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 No alcohol No tobacco No falls Independent with instrumental ADLs No sig memory issues  Having terrible tooth pain---getting root canal later today  Caught cold a couple of weeks ago Mostly better but still having a lot of drainage and he is coughing that stuff up  Chronic back pain is about the same Getting injections every 3 months Continues on the hydrocodone Gets some anxiety with pain---continues on the daily AM xanax (and sertraline)  No palpitations lately No chest pain or SOB No dizziness or syncope No edema Occasional mild headache--tylenol works  BMI still at 40 Is careful with eating--so frustrated by the lack of weight loss Continues on the CPAP every night  Current Outpatient Medications on File Prior to Visit  Medication Sig Dispense Refill   ALPRAZolam (XANAX) 0.5 MG tablet TAKE 1 TABLET BY MOUTH 3 TIMES DAILY AS NEEDED 90 tablet 0   apixaban (ELIQUIS) 5 MG TABS tablet Take 1 tablet (5 mg total) by mouth 2 (two) times daily. 60 tablet 11   Apple Cider Vinegar 500 MG TABS Take 500 mg by mouth daily.      cetirizine (ZYRTEC) 10 MG tablet Take 10 mg by mouth daily.     Cholecalciferol (VITAMIN D) 50 MCG (2000 UT) CAPS Take 1 capsule by mouth daily.     CINNAMON PO Take 200 mg by mouth daily.     ezetimibe (ZETIA) 10 MG tablet TAKE 1  TABLET BY MOUTH DAILY 90 tablet 3   fluticasone (FLONASE) 50 MCG/ACT nasal spray Place 2 sprays into both nostrils in the morning and at bedtime. 16 g 0   Garlic [id number] MG CAPS Take 2,000 mg by mouth 2 (two) times daily.      Ginkgo Biloba 120 MG CAPS Take 120 mg by mouth daily.     HYDROcodone-acetaminophen (NORCO/VICODIN) 5-325 MG tablet TAKE 1 TABLET BY MOUTH EVERY 6 HOURS AS NEEDED FOR MODERATE PAIN 120 tablet 0   loperamide (IMODIUM A-D) 2 MG tablet Take 2 tablets (4 mg total) by mouth 4 (four) times daily as needed for diarrhea or loose stools. 30 tablet 0   metoprolol succinate (TOPROL-XL) 50 MG 24 hr tablet Take 1 tablet (50 mg total) by mouth daily. 90 tablet 1   Multiple Vitamin (MULTIVITAMIN WITH MINERALS) TABS tablet Take 1 tablet by mouth daily.     niacin 500 MG CR capsule Take 500 mg by mouth 2 (two) times daily.     Omega-3 Fatty Acids (FISH OIL) 1200 MG CPDR Take 1,200 mg by mouth daily.     omeprazole (PRILOSEC) 20 MG capsule TAKE 1 CAPSULE BY MOUTH DAILY 90 capsule 3   sertraline (ZOLOFT) 25 MG tablet TAKE 1 TABLET BY MOUTH DAILY 90 tablet 3   sildenafil (VIAGRA) 100 MG tablet Take as directed (Patient taking differently: Take 100 mg by mouth daily as needed for erectile dysfunction. Take as directed) 10 tablet 5   SYSTANE ULTRA 0.4-0.3 % SOLN Place 1  drop into both eyes 4 (four) times daily as needed (dry/irritated eyes.).     testosterone cypionate (DEPOTESTOSTERONE CYPIONATE) 200 MG/ML injection Inject 200 mg into the muscle every 14 (fourteen) days. Saturdays     triamcinolone (KENALOG) 0.1 % Apply 1 application topically 2 (two) times daily as needed. 45 g 1   Turmeric (QC TUMERIC COMPLEX PO) Take 500 mg by mouth daily.     valsartan-hydrochlorothiazide (DIOVAN-HCT) 320-25 MG tablet TAKE 1 TABLET BY MOUTH DAILY 90 tablet 3   vitamin C (ASCORBIC ACID) 500 MG tablet Take 500 mg by mouth daily.     zinc gluconate 50 MG tablet Take 50 mg by mouth daily.     No current  facility-administered medications on file prior to visit.    Allergies  Allergen Reactions   Atorvastatin Other (See Comments)    Muscle weakness in his legs   Crestor [Rosuvastatin] Other (See Comments)    Severe muscle cramping   Zocor [Simvastatin] Other (See Comments)    Muscle weakness in his legs   Atorvastatin Calcium Other (See Comments)    Past Medical History:  Diagnosis Date   Anxiety state 0000000   denies   Complication of anesthesia    and trouble waking up   Degenerative disc disease    Dysthymia    denies   ED (erectile dysfunction)    GERD (gastroesophageal reflux disease)    History of MRSA infection    on lip from cut   Hyperlipemia    Hypertension    take BP med for small leak in heart to keep it small per pt.   Mild asthma    as a teen   Morbid obesity ([place])    OSA (obstructive sleep apnea)    Osteoarthritis    PONV (postoperative nausea and vomiting)    Rectal fissure    Testosterone deficiency     Past Surgical History:  Procedure Laterality Date   [place] SURGERY  09/2010   left knee and right knee arthroscopy   OTHER SURGICAL HISTORY     arthroscopic subacromial decompression   OTHER SURGICAL HISTORY     open resection, distal right clavicle   SHOULDER ARTHROSCOPY     right   SPHINCTEROTOMY     TOTAL KNEE ARTHROPLASTY Left 06/11/2018   Procedure: LEFT TOTAL KNEE ARTHROPLASTY;  Surgeon: Gaynelle Arabian, MD;  Location: [facility];  Service: Orthopedics;  Laterality: Left;  31mn   TOTAL KNEE ARTHROPLASTY Right 12/17/2018   Procedure: TOTAL KNEE ARTHROPLASTY;  Surgeon: AGaynelle Arabian MD;  Location: [facility];  Service: Orthopedics;  Laterality: Right;  565m    Family History  Problem Relation Age of Onset   Pneumonia Brother     Social History   Socioeconomic History   Marital status: Divorced    Spouse name: Not on file   Number of children: 1   Years of education: Not on file   Highest education level: Not  on file  Occupational History   Occupation: long distance driver    Comment: Retired   Occupation: [employer] part time    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)Tobacco Use   Smoking status: Never    Passive exposure: Past   Smokeless tobacco: Never   Tobacco comments:    CIGARS ONLY--QUIT 2013  Vaping Use   Vaping Use: Never used  Substance and Sexual Activity   Alcohol use: Yes    Alcohol/week: 0.0 standard  drinks of alcohol    Comment: rare//occassional   Drug use: No   Sexual activity: Yes  Other Topics Concern   Not on file  Social History Narrative   Has living will   Son is health care POA   Would accept resuscitation attempts   Likely wouldn't want extended tube feeds if cognitively unaware   Social Determinants of Health   Financial Resource Strain: Not on file  Food Insecurity: Not on file  Transportation Needs: Not on file  Physical Activity: Not on file  Stress: Not on file  Social Connections: Not on file  Intimate Partner Violence: Not on file   Review of Systems Appetite is okay Sleeps okay Keeps up with the dentist No derm visits recently---nothing of concern now No heartburn---no dysphagia Bowels move fine--no blood Voids okay---good stream    Objective:   Physical Exam Constitutional:      Appearance: Normal appearance.  HENT:     Mouth/Throat:     Pharynx: No oropharyngeal exudate or posterior oropharyngeal erythema.  Eyes:     Conjunctiva/sclera: Conjunctivae normal.     Pupils: Pupils are equal, round, and reactive to light.  Cardiovascular:     Rate and Rhythm: Normal rate and regular rhythm.     Pulses: Normal pulses.     Heart sounds:     No gallop.  Pulmonary:     Effort: Pulmonary effort is normal.     Breath sounds: Normal breath sounds. No wheezing or rales.  Abdominal:     Palpations: Abdomen is soft.     Tenderness: There is no abdominal tenderness.  Musculoskeletal:     Cervical back: Neck supple.     Right lower  leg: No edema.     Left lower leg: No edema.  Lymphadenopathy:     Cervical: No cervical adenopathy.  Skin:    Findings: No lesion or rash.  Neurological:     General: No focal deficit present.     Mental Status: He is alert and oriented to person, place, and time.     Comments: Word naming-- 13/1 minute Recall 3/3  Psychiatric:        Mood and Affect: Mood normal.        Behavior: Behavior normal.            Assessment & Plan:

## 2022-08-15 NOTE — Assessment & Plan Note (Signed)
With chronic pain Continues on the hydrocodone and injections every 3 months

## 2022-08-15 NOTE — Assessment & Plan Note (Signed)
PDMP reviewed No concerns 

## 2022-08-15 NOTE — Assessment & Plan Note (Signed)
I have personally reviewed the Medicare Annual Wellness questionnaire and have noted 1. The patient's medical and social history 2. Their use of alcohol, tobacco or illicit drugs 3. Their current medications and supplements 4. The patient's functional ability including ADL's, fall risks, home safety risks and hearing or visual             impairment. 5. Diet and physical activities 6. Evidence for depression or mood disorders  The patients weight, height, BMI and visual acuity have been recorded in the chart I have made referrals, counseling and provided education to the patient based review of the above and I have provided the pt with a written personalized care plan for preventive services.  I have provided you with a copy of your personalized plan for preventive services. Please take the time to review along with your updated medication list.  No more colonoscopies--normal 2021 Recent PSA--may get more from urologist Stays active Consider shingrix at [place] update---and flu/RSV in the fall

## 2022-08-15 NOTE — Assessment & Plan Note (Signed)
Mostly anxiety associated with chronic pain Daily sertraline '25mg'$  and alprazolam 0.5 mg in the morning

## 2022-08-19 NOTE — Telephone Encounter (Signed)
Attempted to contact Dr. Kalman Shan office. Received voicemail stating office is closed on Friday. Left message to call back.    Minna Merritts, MD  [facility] [phone] hours ago (5:09 PM)    Using small amount of epi into the gum to stop bleeding should be fine and should not trigger atrial fibrillation Thx TGollan

## 2022-08-22 NOTE — Telephone Encounter (Signed)
Left voicemail message on patients number.   Called number for [facility] and it was busy signal.

## 2022-08-22 NOTE — Telephone Encounter (Signed)
Spoke with Network engineer at [employer]. Requested to speak with Anne Ng and she was not available at this time. Reviewed with secretary patient identification and provider recommendations. Advised that if she should need to speak with me provided number at desk.

## 2022-08-22 NOTE — Telephone Encounter (Signed)
Patient returned previous call. Reviewed provider recommendations and he verbalized understanding with no further questions at this time.

## 2022-08-31 ENCOUNTER — Other Ambulatory Visit: Payer: Self-pay | Admitting: Medical

## 2022-08-31 ENCOUNTER — Other Ambulatory Visit: Payer: Self-pay | Admitting: Internal Medicine

## 2022-08-31 ENCOUNTER — Other Ambulatory Visit: Payer: Self-pay | Admitting: *Deleted

## 2022-08-31 MED ORDER — METOPROLOL SUCCINATE ER 50 MG PO TB24: 50.0000 mg | ORAL_TABLET | Freq: Every day | ORAL | 2 refills | Status: DC

## 2022-08-31 NOTE — Telephone Encounter (Signed)
Name of Medication: Hydrocodone apap 5-325 mg Name of Pharmacy: [facility] Date and Quantity: 08-01-22 # 120 Last Office Visit and Type: 08-15-22 Next Office Visit and Type: 11-14-22 Last Controlled Substance Agreement Date: 02/21/22 Last UDS: 02/21/22

## 2022-09-07 DIAGNOSIS — M21622 Bunionette of left foot: Secondary | ICD-10-CM | POA: Diagnosis not present

## 2022-09-07 DIAGNOSIS — M722 Plantar fascial fibromatosis: Secondary | ICD-10-CM | POA: Diagnosis not present

## 2022-09-07 DIAGNOSIS — I739 Peripheral vascular disease, unspecified: Secondary | ICD-10-CM | POA: Diagnosis not present

## 2022-09-07 DIAGNOSIS — M79671 Pain in right foot: Secondary | ICD-10-CM | POA: Diagnosis not present

## 2022-09-07 DIAGNOSIS — M21621 Bunionette of right foot: Secondary | ICD-10-CM | POA: Diagnosis not present

## 2022-09-07 DIAGNOSIS — B351 Tinea unguium: Secondary | ICD-10-CM | POA: Diagnosis not present

## 2022-09-07 DIAGNOSIS — M2021 Hallux rigidus, right foot: Secondary | ICD-10-CM | POA: Diagnosis not present

## 2022-09-19 DIAGNOSIS — M21622 Bunionette of left foot: Secondary | ICD-10-CM | POA: Diagnosis not present

## 2022-09-19 DIAGNOSIS — L97511 Non-pressure chronic ulcer of other part of right foot limited to breakdown of skin: Secondary | ICD-10-CM | POA: Diagnosis not present

## 2022-09-19 DIAGNOSIS — I739 Peripheral vascular disease, unspecified: Secondary | ICD-10-CM | POA: Diagnosis not present

## 2022-09-19 DIAGNOSIS — M2021 Hallux rigidus, right foot: Secondary | ICD-10-CM | POA: Diagnosis not present

## 2022-09-19 DIAGNOSIS — M21621 Bunionette of right foot: Secondary | ICD-10-CM | POA: Diagnosis not present

## 2022-09-19 DIAGNOSIS — M722 Plantar fascial fibromatosis: Secondary | ICD-10-CM | POA: Diagnosis not present

## 2022-09-20 ENCOUNTER — Other Ambulatory Visit (INDEPENDENT_AMBULATORY_CARE_PROVIDER_SITE_OTHER): Payer: Self-pay | Admitting: Nurse Practitioner

## 2022-09-20 DIAGNOSIS — I739 Peripheral vascular disease, unspecified: Secondary | ICD-10-CM

## 2022-09-20 DIAGNOSIS — L97511 Non-pressure chronic ulcer of other part of right foot limited to breakdown of skin: Secondary | ICD-10-CM | POA: Diagnosis not present

## 2022-09-22 ENCOUNTER — Ambulatory Visit (INDEPENDENT_AMBULATORY_CARE_PROVIDER_SITE_OTHER): Payer: Medicare Other

## 2022-09-22 DIAGNOSIS — I739 Peripheral vascular disease, unspecified: Secondary | ICD-10-CM

## 2022-09-23 ENCOUNTER — Ambulatory Visit (INDEPENDENT_AMBULATORY_CARE_PROVIDER_SITE_OTHER): Payer: Medicare Other | Admitting: Vascular Surgery

## 2022-09-23 ENCOUNTER — Encounter (INDEPENDENT_AMBULATORY_CARE_PROVIDER_SITE_OTHER): Payer: Self-pay | Admitting: Vascular Surgery

## 2022-09-23 VITALS — BP 145/80 | HR 75 | Resp 18 | Ht 72.0 in | Wt 289.4 lb

## 2022-09-23 DIAGNOSIS — I4819 Other persistent atrial fibrillation: Secondary | ICD-10-CM

## 2022-09-23 DIAGNOSIS — L97512 Non-pressure chronic ulcer of other part of right foot with fat layer exposed: Secondary | ICD-10-CM | POA: Insufficient documentation

## 2022-09-23 DIAGNOSIS — E785 Hyperlipidemia, unspecified: Secondary | ICD-10-CM | POA: Diagnosis not present

## 2022-09-23 DIAGNOSIS — I10 Essential (primary) hypertension: Secondary | ICD-10-CM | POA: Diagnosis not present

## 2022-09-23 NOTE — Assessment & Plan Note (Addendum)
lipid control important in reducing the progression of atherosclerotic disease.   

## 2022-09-23 NOTE — Assessment & Plan Note (Signed)
blood pressure control important in reducing the progression of atherosclerotic disease. On appropriate oral medications.  

## 2022-09-23 NOTE — Assessment & Plan Note (Signed)
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  Podiatry appropriately with has sent him over for evaluation of his perfusion for wound healing.  Although he has some medial calcification falsely elevating his ABIs, his waveforms remain multiphasic and his digit pressures are normal bilaterally consistent with good blood flow which appears adequate for wound healing or any surgical procedure on the foot.  No role for angiography or further invasive evaluation at this time unless the wound worsens despite appropriate therapy.  We will see him back as needed.

## 2022-09-23 NOTE — Progress Notes (Signed)
Patient ID: Dakota Ellison, male   DOB: Mar 13, 1951, 72 y.o.   MRN: [id number]  Chief Complaint  Patient presents with   Follow-up    NP. baker. abi/consult.    HPI Dakota Ellison is a 72 y.o. male.  I am asked to see the patient by Dr. Excell Seltzer for evaluation of right foot ulceration.  The patient has a chronic ulceration of the right foot.  This is currently dressed and there are no signs of active infection.  He denies any fevers or chills or signs of systemic infection.  It is mildly painful.  He is walking in a postop shoe currently without too much limitation.  He did not left leg symptoms currently.  Prior to any procedures on this foot and also to evaluate its possibility of wound healing, noninvasive studies were performed in our office this week. Although he has some medial calcification falsely elevating his ABIs, his waveforms remain multiphasic and his digit pressures are normal bilaterally consistent with good blood flow which appears adequate for wound healing or any surgical procedure on the foot.   Past Medical History:  Diagnosis Date   Anxiety state 04/08/2014   denies   Complication of anesthesia    and trouble waking up   Degenerative disc disease    Dysthymia    denies   ED (erectile dysfunction)    GERD (gastroesophageal reflux disease)    History of MRSA infection    on lip from cut   Hyperlipemia    Hypertension    take BP med for small leak in heart to keep it small per pt.   Mild asthma    as a teen   Morbid obesity    OSA (obstructive sleep apnea)    Osteoarthritis    PONV (postoperative nausea and vomiting)    Rectal fissure    Testosterone deficiency     Past Surgical History:  Procedure Laterality Date   [place] REPAIR     1998   [place] SURGERY  09/2010   left knee and right knee arthroscopy   OTHER SURGICAL HISTORY     arthroscopic subacromial decompression   OTHER SURGICAL HISTORY     open resection, distal right clavicle   SHOULDER  ARTHROSCOPY     right   SPHINCTEROTOMY     TOTAL KNEE ARTHROPLASTY Left 06/11/2018   Procedure: LEFT TOTAL KNEE ARTHROPLASTY;  Surgeon: [name], MD;  Location: [facility];  Service: Orthopedics;  Laterality: Left;    TOTAL KNEE ARTHROPLASTY Right 12/17/2018   Procedure: TOTAL KNEE ARTHROPLASTY;  Surgeon: [name], MD;  Location: [facility];  Service: Orthopedics;  Laterality: Right;      Family History  Problem Relation Age of Onset   Pneumonia Brother   No bleeding or clotting disorders No aneurysms    Social History   Tobacco Use   Smoking status: Never    Passive exposure: Past   Smokeless tobacco: Never   Tobacco comments:    CIGARS ONLY--QUIT 2013  Vaping Use   Vaping Use: Never used  Substance Use Topics   Alcohol use: Yes    Alcohol/week: 0.0 standard drinks of alcohol    Comment: rare//occassional   Drug use: No     Allergies  Allergen Reactions   Atorvastatin Other (See Comments)    Muscle weakness in his legs   Crestor [Rosuvastatin] Other (See Comments)    Severe muscle cramping   Zocor [Simvastatin] Other (See Comments)  Muscle weakness in his legs   Atorvastatin Calcium Other (See Comments)    Current Outpatient Medications  Medication Sig Dispense Refill   ALPRAZolam (XANAX) 0.5 MG tablet TAKE 1 TABLET BY MOUTH 3 TIMES DAILY AS NEEDED 90 tablet 0   amoxicillin-clavulanate (AUGMENTIN) 875-125 MG tablet Take 1 tablet by mouth 2 (two) times daily. 14 tablet 0   apixaban (ELIQUIS) 5 MG TABS tablet Take 1 tablet (5 mg total) by mouth 2 (two) times daily. 60 tablet 11   Apple Cider Vinegar 500 MG TABS Take 500 mg by mouth daily.      cetirizine (ZYRTEC) 10 MG tablet Take 10 mg by mouth daily.     Cholecalciferol (VITAMIN D) 50 MCG (2000 UT) CAPS Take 1 capsule by mouth daily.     CINNAMON PO Take 200 mg by mouth daily.     doxycycline (VIBRA-TABS) 100 MG tablet Take 100 mg by mouth 2 (two) times daily.     ezetimibe (ZETIA) 10 MG tablet  TAKE 1 TABLET BY MOUTH DAILY 90 tablet 3   fluticasone (FLONASE) 50 MCG/ACT nasal spray Place 2 sprays into both nostrils in the morning and at bedtime. 16 g 0   Garlic 2000 MG CAPS Take 2,000 mg by mouth 2 (two) times daily.      Ginkgo Biloba 120 MG CAPS Take 120 mg by mouth daily.     HYDROcodone-acetaminophen (NORCO/VICODIN) 5-325 MG tablet TAKE 1 TABLET BY MOUTH EVERY 6 HOURS AS NEEDED FOR MODERATE PAIN 120 tablet 0   loperamide (IMODIUM A-D) 2 MG tablet Take 2 tablets (4 mg total) by mouth 4 (four) times daily as needed for diarrhea or loose stools. 30 tablet 0   metoprolol succinate (TOPROL-XL) 50 MG 24 hr tablet Take 1 tablet (50 mg total) by mouth daily. 90 tablet 2   Multiple Vitamin (MULTIVITAMIN WITH MINERALS) TABS tablet Take 1 tablet by mouth daily.     niacin 500 MG CR capsule Take 500 mg by mouth 2 (two) times daily.     Omega-3 Fatty Acids (FISH OIL) 1200 MG CPDR Take 1,200 mg by mouth daily.     omeprazole (PRILOSEC) 20 MG capsule TAKE 1 CAPSULE BY MOUTH DAILY 90 capsule 3   sertraline (ZOLOFT) 25 MG tablet TAKE 1 TABLET BY MOUTH DAILY 90 tablet 3   sildenafil (VIAGRA) 100 MG tablet Take as directed (Patient taking differently: Take 100 mg by mouth daily as needed for erectile dysfunction. Take as directed) 10 tablet 5   SYSTANE ULTRA 0.4-0.3 % SOLN Place 1 drop into both eyes 4 (four) times daily as needed (dry/irritated eyes.).     testosterone cypionate (DEPOTESTOSTERONE CYPIONATE) 200 MG/ML injection Inject 200 mg into the muscle every 14 (fourteen) days. Saturdays     triamcinolone (KENALOG) 0.1 % Apply 1 application topically 2 (two) times daily as needed. 45 g 1   Turmeric (QC TUMERIC COMPLEX PO) Take 500 mg by mouth daily.     valsartan-hydrochlorothiazide (DIOVAN-HCT) 320-25 MG tablet TAKE 1 TABLET BY MOUTH DAILY 90 tablet 3   vitamin C (ASCORBIC ACID) 500 MG tablet Take 500 mg by mouth daily.     zinc gluconate 50 MG tablet Take 50 mg by mouth daily.     No current  facility-administered medications for this visit.      REVIEW OF SYSTEMS (Negative unless checked)  Constitutional: [] Weight loss  [] Fever  [] Chills Cardiac: [] Chest pain   [] Chest pressure   [x] Palpitations   [] Shortness of breath when  laying flat   Shortness of breath at rest   Shortness of breath with exertion. Vascular:  Pain in legs with walking   Pain in legs at rest   Pain in legs when laying flat   Claudication   Pain in feet when walking  Pain in feet at rest  Pain in feet when laying flat   History of DVT   Phlebitis   Swelling in legs   Varicose veins   Non-healing ulcers Pulmonary:   Uses home oxygen   Productive cough   Hemoptysis   Wheeze  COPD   Asthma Neurologic:  Dizziness  Blackouts   Seizures   History of stroke   History of TIA  Aphasia   Temporary blindness   Dysphagia   Weakness or numbness in arms   Weakness or numbness in legs Musculoskeletal:  Arthritis   Joint swelling   Joint pain   Low back pain Hematologic:  Easy bruising  Easy bleeding   Hypercoagulable state   Anemic  Hepatitis Gastrointestinal:  Blood in stool   Vomiting blood  Gastroesophageal reflux/heartburn   Abdominal pain Genitourinary:  Chronic kidney disease   Difficult urination  Frequent urination  Burning with urination   Hematuria Skin:  Rashes   Ulcers   Wounds Psychological:  History of anxiety    History of major depression.    Physical Exam BP (!) 145/80 (BP Location: Left Arm)   Pulse 75   Resp 18   Ht 6' (1.829 m)   Wt 289 lb 6.4 oz (131.3 kg)   BMI 39.25 kg/m  Gen:  WD/WN, NAD Head: [place]/AT, No temporalis wasting.  Ear/Nose/Throat: Hearing grossly intact, nares w/o erythema or drainage, oropharynx w/o Erythema/Exudate Eyes: Conjunctiva clear, sclera non-icteric  Neck: trachea midline.  No JVD.  Pulmonary:  Good air movement, respirations not labored, no use of accessory  muscles  Cardiac: irregular Vascular:  Vessel Right Left  Radial Palpable Palpable                          PT 1+ 1+  DP 2+ 2+   Gastrointestinal:. No masses, surgical incisions, or scars. Musculoskeletal: M/S 5/5 throughout.  Extremities without ischemic changes.  No deformity or atrophy.  Right foot wound is currently dressed with a bandage.  Mild bilateral lower extremity edema. Neurologic: Sensation grossly intact in extremities.  Symmetrical.  Speech is fluent. Motor exam as listed above. Psychiatric: Judgment intact, Mood & affect appropriate for pt's clinical situation. Dermatologic: No rashes or ulcers noted.  No cellulitis or open wounds.    Radiology No results found.  Labs Recent Results (from the past 2160 hour(s))  Comprehensive metabolic panel     Status: None   Collection Time: 08/15/22 12:41 PM  Result Value Ref Range   Sodium 137 135 - 145 mEq/L   Potassium 3.9 3.5 - 5.1 mEq/L   Chloride 99 96 - 112 mEq/L   CO2 27 19 - 32 mEq/L   Glucose, Bld 98 70 - 99 mg/dL   BUN 14 6 - 23 mg/dL   Creatinine, Ser 1.61 0.40 - 1.50 mg/dL   Total Bilirubin 0.5 0.2 - 1.2 mg/dL   Alkaline Phosphatase 75 39 - 117 U/L   AST 26 0 - 37 U/L   ALT 30 0 - 53 U/L   Total Protein 7.2 6.0 - 8.3 g/dL   Albumin 4.1 3.5 - 5.2 g/dL   GFR 09.60 >45.40 mL/min  Comment: Calculated using the CKD-EPI Creatinine Equation (2021)   Calcium 9.9 8.4 - 10.5 mg/dL  Lipid panel     Status: Abnormal   Collection Time: 08/15/22 12:41 PM  Result Value Ref Range   Cholesterol 138 0 - 200 mg/dL    Comment: ATP III Classification       Desirable:  < 200 mg/dL               Borderline High:  200 - 239 mg/dL          High:  > = 161 mg/dL   Triglycerides (H) 0.0 - 149.0 mg/dL    096.0 Triglyceride is over 400; calculations on Lipids are invalid.    Comment: Normal:  <150 mg/dLBorderline High:  150 - 199 mg/dL   HDL 45.40 (L) >98.11 mg/dL   Total CHOL/HDL Ratio 4     Comment:                Men           Women1/2 Average Risk     3.4          3.3Average Risk          5.0          4.42X Average Risk          9.6          7.13X Average Risk          15.0          11.0                      CBC     Status: None   Collection Time: 08/15/22 12:41 PM  Result Value Ref Range   WBC 10.5 4.0 - 10.5 K/uL   RBC 5.63 4.22 - 5.81 Mil/uL   Platelets 281.0 150.0 - 400.0 K/uL   Hemoglobin 16.7 13.0 - 17.0 g/dL   HCT 91.4 78.2 - 95.6 %   MCV 86.3 78.0 - 100.0 fl   MCHC 34.4 30.0 - 36.0 g/dL   RDW 21.3 08.6 - 57.8 %  TSH     Status: Abnormal   Collection Time: 08/15/22 12:41 PM  Result Value Ref Range   TSH 5.99 (H) 0.35 - 5.50 uIU/mL  Hemoglobin A1c     Status: None   Collection Time: 08/15/22 12:41 PM  Result Value Ref Range   Hgb A1c MFr Bld 6.5 4.6 - 6.5 %    Comment: Glycemic Control Guidelines for People with Diabetes:Non Diabetic:  <6%Goal of Therapy: <7%Additional Action Suggested:  >8%   LDL cholesterol, direct     Status: None   Collection Time: 08/15/22 12:41 PM  Result Value Ref Range   Direct LDL 52.0 mg/dL    Comment: Optimal:  <469 mg/dLNear or Above Optimal:  100-129 mg/dLBorderline High:  130-159 mg/dLHigh:  160-189 mg/dLVery High:  >190 mg/dL    Assessment/Plan:  Foot ulceration, right, with fat layer exposed Patient has a nonhealing ulceration on the right foot.  Podiatry appropriately with has sent him over for evaluation of his perfusion for wound healing.  Although he has some medial calcification falsely elevating his ABIs, his waveforms remain multiphasic and his digit pressures are normal bilaterally consistent with good blood flow which appears adequate for wound healing or any surgical procedure on the foot.  No role for angiography or further invasive evaluation at this time unless the wound worsens despite appropriate therapy.  We will  see him back as needed.  Hyperlipidemia lipid control important in reducing the progression of atherosclerotic disease.   Essential  hypertension blood pressure control important in reducing the progression of atherosclerotic disease. On appropriate oral medications.   Atrial fibrillation (HCC) Rate controlled and on anticoagulation      Festus Barren 09/23/2022, 9:20 AM   This note was created with Dragon medical transcription system.  Any errors from dictation are unintentional.

## 2022-09-23 NOTE — Assessment & Plan Note (Signed)
Rate controlled and on anticoagulation. 

## 2022-09-26 ENCOUNTER — Other Ambulatory Visit: Payer: Self-pay | Admitting: Internal Medicine

## 2022-09-26 DIAGNOSIS — I739 Peripheral vascular disease, unspecified: Secondary | ICD-10-CM | POA: Diagnosis not present

## 2022-09-26 DIAGNOSIS — M216X2 Other acquired deformities of left foot: Secondary | ICD-10-CM | POA: Diagnosis not present

## 2022-09-26 DIAGNOSIS — Z79899 Other long term (current) drug therapy: Secondary | ICD-10-CM | POA: Diagnosis not present

## 2022-09-26 DIAGNOSIS — M545 Low back pain, unspecified: Secondary | ICD-10-CM | POA: Diagnosis not present

## 2022-09-26 DIAGNOSIS — M216X1 Other acquired deformities of right foot: Secondary | ICD-10-CM | POA: Diagnosis not present

## 2022-09-26 DIAGNOSIS — M2021 Hallux rigidus, right foot: Secondary | ICD-10-CM | POA: Diagnosis not present

## 2022-09-26 DIAGNOSIS — M21621 Bunionette of right foot: Secondary | ICD-10-CM | POA: Diagnosis not present

## 2022-09-26 DIAGNOSIS — M19071 Primary osteoarthritis, right ankle and foot: Secondary | ICD-10-CM | POA: Diagnosis not present

## 2022-09-26 DIAGNOSIS — M21622 Bunionette of left foot: Secondary | ICD-10-CM | POA: Diagnosis not present

## 2022-09-26 DIAGNOSIS — L97511 Non-pressure chronic ulcer of other part of right foot limited to breakdown of skin: Secondary | ICD-10-CM | POA: Diagnosis not present

## 2022-09-26 DIAGNOSIS — M19072 Primary osteoarthritis, left ankle and foot: Secondary | ICD-10-CM | POA: Diagnosis not present

## 2022-09-26 DIAGNOSIS — M2022 Hallux rigidus, left foot: Secondary | ICD-10-CM | POA: Diagnosis not present

## 2022-09-26 DIAGNOSIS — B351 Tinea unguium: Secondary | ICD-10-CM | POA: Diagnosis not present

## 2022-09-26 LAB — VAS US ABI WITH/WO TBI
Left ABI: >1
Right ABI: >1

## 2022-09-27 NOTE — Telephone Encounter (Signed)
Name of Medication: Hydrocodone apap 5-325 mg Name of Pharmacy: [facility] Last Fill or Written Date and Quantity: 08-31-22 # 120 Last Office Visit and Type: 08-15-22 Next Office Visit and Type: 11-14-22 Last Controlled Substance Agreement Date: 02/21/22 Last UDS: 02/21/22

## 2022-09-28 ENCOUNTER — Telehealth: Payer: Self-pay | Admitting: Internal Medicine

## 2022-09-28 NOTE — Telephone Encounter (Signed)
He will need to make an office visit to discuss it. Please help him get set up. Thanks.

## 2022-09-28 NOTE — Telephone Encounter (Signed)
Patient scheduled.

## 2022-09-28 NOTE — Telephone Encounter (Signed)
Patient called in stating that he is interested in being on ozempic. He said that he have a lot of friends that are taking it and it has helped them lose weight. He would like to know would Dr Alphonsus Sias prescribe it to him?Please advise.

## 2022-10-03 ENCOUNTER — Ambulatory Visit (INDEPENDENT_AMBULATORY_CARE_PROVIDER_SITE_OTHER): Payer: Medicare Other | Admitting: Internal Medicine

## 2022-10-03 ENCOUNTER — Encounter: Payer: Self-pay | Admitting: Internal Medicine

## 2022-10-03 DIAGNOSIS — F39 Unspecified mood [affective] disorder: Secondary | ICD-10-CM

## 2022-10-03 DIAGNOSIS — L97511 Non-pressure chronic ulcer of other part of right foot limited to breakdown of skin: Secondary | ICD-10-CM | POA: Diagnosis not present

## 2022-10-03 DIAGNOSIS — I739 Peripheral vascular disease, unspecified: Secondary | ICD-10-CM | POA: Diagnosis not present

## 2022-10-03 MED ORDER — ALPRAZOLAM 0.5 MG PO TABS: ORAL_TABLET | ORAL | 0 refills | Status: DC

## 2022-10-03 NOTE — Progress Notes (Signed)
Subjective:    Patient ID: Dakota Ellison, male    DOB: 1950/12/30, 72 y.o.   MRN: [id number]  HPI Here to discuss starting ozempic  Did review his last labs--sugar was normal Has lost weight before in the past---but mostly with exercise (which he can't now) Avoids sweets, tries to be reasonable with eating--but some junk food Rare soda--no other sugared drinks Still lots of carbs  Current Outpatient Medications on File Prior to Visit  Medication Sig Dispense Refill   ALPRAZolam (XANAX) 0.5 MG tablet TAKE 1 TABLET BY MOUTH 3 TIMES DAILY AS NEEDED 90 tablet 0   apixaban (ELIQUIS) 5 MG TABS tablet Take 1 tablet (5 mg total) by mouth 2 (two) times daily. 60 tablet 11   Apple Cider Vinegar 500 MG TABS Take 500 mg by mouth daily.      cetirizine (ZYRTEC) 10 MG tablet Take 10 mg by mouth daily.     Cholecalciferol (VITAMIN D) 50 MCG (2000 UT) CAPS Take 1 capsule by mouth daily.     CINNAMON PO Take 200 mg by mouth daily.     ezetimibe (ZETIA) 10 MG tablet TAKE 1 TABLET BY MOUTH DAILY 90 tablet 3   fluticasone (FLONASE) 50 MCG/ACT nasal spray Place 2 sprays into both nostrils in the morning and at bedtime. 16 g 0   Garlic 2000 MG CAPS Take 2,000 mg by mouth 2 (two) times daily.      Ginkgo Biloba 120 MG CAPS Take 120 mg by mouth daily.     HYDROcodone-acetaminophen (NORCO/VICODIN) 5-325 MG tablet TAKE 1 TABLET BY MOUTH EVERY 6 HOURS AS NEEDED FOR MODERATE PAIN 120 tablet 0   loperamide (IMODIUM A-D) 2 MG tablet Take 2 tablets (4 mg total) by mouth 4 (four) times daily as needed for diarrhea or loose stools. 30 tablet 0   metoprolol succinate (TOPROL-XL) 50 MG 24 hr tablet Take 1 tablet (50 mg total) by mouth daily. 90 tablet 2   Multiple Vitamin (MULTIVITAMIN WITH MINERALS) TABS tablet Take 1 tablet by mouth daily.     niacin 500 MG CR capsule Take 500 mg by mouth 2 (two) times daily.     Omega-3 Fatty Acids (FISH OIL) 1200 MG CPDR Take 1,200 mg by mouth daily.     omeprazole (PRILOSEC) 20  MG capsule TAKE 1 CAPSULE BY MOUTH DAILY 90 capsule 3   sertraline (ZOLOFT) 25 MG tablet TAKE 1 TABLET BY MOUTH DAILY 90 tablet 3   sildenafil (VIAGRA) 100 MG tablet Take as directed (Patient taking differently: Take 100 mg by mouth daily as needed for erectile dysfunction. Take as directed) 10 tablet 5   SYSTANE ULTRA 0.4-0.3 % SOLN Place 1 drop into both eyes 4 (four) times daily as needed (dry/irritated eyes.).     testosterone cypionate (DEPOTESTOSTERONE CYPIONATE) 200 MG/ML injection Inject 200 mg into the muscle every 14 (fourteen) days. Saturdays     triamcinolone (KENALOG) 0.1 % Apply 1 application topically 2 (two) times daily as needed. 45 g 1   Turmeric (QC TUMERIC COMPLEX PO) Take 500 mg by mouth daily.     valsartan-hydrochlorothiazide (DIOVAN-HCT) 320-25 MG tablet TAKE 1 TABLET BY MOUTH DAILY 90 tablet 3   vitamin C (ASCORBIC ACID) 500 MG tablet Take 500 mg by mouth daily.     zinc gluconate 50 MG tablet Take 50 mg by mouth daily.     No current facility-administered medications on file prior to visit.    Allergies  Allergen Reactions  Atorvastatin Other (See Comments)    Muscle weakness in his legs   Crestor [Rosuvastatin] Other (See Comments)    Severe muscle cramping   Zocor [Simvastatin] Other (See Comments)    Muscle weakness in his legs   Atorvastatin Calcium Other (See Comments)    Past Medical History:  Diagnosis Date   Anxiety state 04/08/2014   denies   Complication of anesthesia    and trouble waking up   Degenerative disc disease    Dysthymia    denies   ED (erectile dysfunction)    GERD (gastroesophageal reflux disease)    History of MRSA infection    on lip from cut   Hyperlipemia    Hypertension    take BP med for small leak in heart to keep it small per pt.   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   Procedure: LEFT TOTAL KNEE ARTHROPLASTY;  Surgeon: [name], MD;  Location: [facility];  Service: Orthopedics;  Laterality: Left;    TOTAL KNEE ARTHROPLASTY Right 12/17/2018   Procedure: TOTAL KNEE ARTHROPLASTY;  Surgeon: [name], MD;  Location: [facility];  Service: Orthopedics;  Laterality: Right;     Family History  Problem Relation Age of Onset   Pneumonia Brother     Social History   Socioeconomic History   Marital status: Divorced    Spouse name: Not on file   Number of children: 1   Years of education: Not on file   Highest education level: Not on file  Occupational History   Occupation: long distance driver    Comment: Retired   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, materials  Tobacco Use   Smoking status: Never    Passive exposure: Past   Smokeless tobacco: Never   Tobacco comments:    CIGARS ONLY--QUIT 2013  Vaping Use   Vaping Use: Never used  Substance and Sexual Activity   Alcohol use: Yes    Alcohol/week: 0.0 standard drinks of alcohol    Comment: rare//occassional   Drug use: No   Sexual activity: Yes  Other Topics Concern   Not on file  Social History Narrative   Has living will   Son is health care POA   Would accept resuscitation attempts   Likely wouldn't want extended tube feeds if cognitively unaware   Social Determinants of Health   Financial Resource Strain: Not on file  Food Insecurity: Not on file  Transportation Needs: Not on file  Physical Activity: Not on file  Stress: Not on file  Social Connections:  Not on file  Intimate Partner Violence: Not on file   Review of Systems     Objective:   Physical Exam         Assessment & Plan:

## 2022-10-03 NOTE — Assessment & Plan Note (Signed)
BMI 38-39 with multiple comorbidities---arthritis, cholesterol, atrial fib. OSA Discussed that I am okay with trying wegovy--if his insurance will cover. Asked him to check into it If so, will start at 0.25mg  weekly and then titrate up from there as tolerated

## 2022-10-03 NOTE — Assessment & Plan Note (Signed)
Continues on the daily xanax---in the morning PDMP reviewed--no concerns

## 2022-10-03 NOTE — Patient Instructions (Signed)
Please see if your insurance will cover wegovy or mounjaro.

## 2022-10-06 ENCOUNTER — Ambulatory Visit: Payer: Medicare Other | Admitting: Internal Medicine

## 2022-10-07 NOTE — Progress Notes (Signed)
Subjective:    Patient ID: Dakota Ellison, male    DOB: 1951/02/08, 72 y.o.   MRN: [id number]  HPI  male Never smoker followed for OSA complicated by morbid obesity, HBP, asthma, GERD NPSG 2010, AHI 82/ hr CPAP to 20  ------------------------------------------------------------------------------------   10/11/21- - 72 year old male never smoker followed for OSA, complicated by morbid obesity, HTN, Asthma, GERD, ( works transporting inmates), Low Back Pain, Hyperlipidemia,  -alprazolam 0.5 tid prn,  CPAP auto 10-20/ [facility]     Resmed AirSense 10 AutoSet  Download-compliance 97%, AHI 9.4/ hr  pressure limiting at 19.7 cwp Body weight today- 295 lbs Covid vax-3 Phizer Flu vax-had -----Patient is still interested in [place] states that he never heard anything.  He did ask for referral to ENT last year but apparently never heard.  He asked about inspire again at this visit and I discussed the basics.  I think he will be too heavy but agreed to put him in touch so that he could get the information he needs.  He feels he sleeps comfortably with CPAP although his breakthrough rate is a little higher than optimal.  He is managed well enough without transitioning to BiPAP which might require a new study.  10/10/22- 72 year old male never smoker followed for OSA, complicated by morbid Obesity, HTN, AFib, Asthma, GERD, ( works transporting inmates), Low Back Pain, Hyperlipidemia,  -alprazolam 0.5 tid prn,  CPAP auto 10-20/ [facility]     Resmed AirSense 10 AutoSet  Download-compliance 100%, AHI 3.6/ hr Body weight today-  Download reviewed.  Sleeping well with CPAP with no direct concerns. Atrial fibrillation last year, managed with medication, converted spontaneously.  Followed by cardiology. CXR 01/19/22- IMPRESSION: No acute cardiopulmonary process.     ROS-see HPI   + = positive Constitutional:    weight loss, night sweats, fevers, chills, fatigue, lassitude. HEENT:    headaches, difficulty  swallowing, tooth/dental problems, sore throat,       sneezing, itching, ear ache, nasal congestion, post nasal drip, snoring CV:    chest pain, orthopnea, PND, swelling in lower extremities, anasarca,                                   dizziness, palpitations Resp:   shortness of breath with exertion or at rest.                productive cough,   non-productive cough, coughing up of blood.              change in color of mucus.  wheezing.   Skin:    rash or lesions. GI:  No-   heartburn, indigestion, abdominal pain, nausea, vomiting, diarrhea,                 change in bowel habits, loss of appetite GU: dysuria, change in color of urine, no urgency or frequency.   flank pain. MS:   joint pain, stiffness, decreased range of motion, back pain. Neuro-     nothing unusual Psych:  change in mood or affect.  depression or anxiety.   memory loss.    Objective:   OBJ- Physical Exam       General- Alert, Oriented, Affect-appropriate, Distress- none acute, + obese/big man Skin- rash-none, lesions- none, excoriation- none Lymphadenopathy- none Head- atraumatic            Eyes- Gross vision intact, PERRLA, conjunctivae and secretions clear  Ears- Hearing, canals-normal            Nose- Clear, no-Septal dev, mucus, polyps, erosion, perforation             Throat- Mallampati III , mucosa clear , drainage- none, tonsils- atrophic Neck- flexible , trachea midline, no stridor , thyroid nl, carotid no bruit Chest - symmetrical excursion , unlabored           Heart/CV- RRR/ occ skip , no murmur , no gallop  , no rub, nl s1 s2                           - JVD- none , edema- none, stasis changes- none, varices- none           Lung- clear to P&A, wheeze- none, cough- none , dullness-none, rub- none           Chest wall-  Abd-  Br/ Gen/ Rectal- Not done, not indicated Extrem- + boot right foot Neuro- grossly intact to observation    Assessment & Plan:

## 2022-10-10 ENCOUNTER — Encounter: Payer: Self-pay | Admitting: Internal Medicine

## 2022-10-10 ENCOUNTER — Ambulatory Visit: Payer: Medicare Other | Admitting: Internal Medicine

## 2022-10-10 VITALS — BP 126/74 | HR 84 | Temp 98.4°F | Ht 72.0 in | Wt 286.8 lb

## 2022-10-10 DIAGNOSIS — I4819 Other persistent atrial fibrillation: Secondary | ICD-10-CM

## 2022-10-10 DIAGNOSIS — G4733 Obstructive sleep apnea (adult) (pediatric): Secondary | ICD-10-CM | POA: Diagnosis not present

## 2022-10-10 NOTE — Patient Instructions (Signed)
We can continue CPAP auto 10-20  Order- referral to Dr Christia Reading, ENT      consider [facility] for OSA

## 2022-10-11 ENCOUNTER — Telehealth: Payer: Self-pay | Admitting: Internal Medicine

## 2022-10-11 MED ORDER — TIRZEPATIDE 2.5 MG/0.5ML ~~LOC~~ SOAJ: 2.5000 mg | SUBCUTANEOUS | 3 refills | Status: DC

## 2022-10-11 NOTE — Telephone Encounter (Signed)
Called and spoke to pt. Advised him to get his weight at the start of the medication and after the last dose. Advised him to let us know how he is doing and if he is ready to increase or stay on loser dose and provide [place] with a weight.

## 2022-10-11 NOTE — Telephone Encounter (Signed)
Pt said he spoke to his insurance. He will have to have a Agricultural consultant form. They will cover Wegovy or Mounjaro. Send to [employer]. Will probably need to be Mounjaro due to [facility] backorder.

## 2022-10-11 NOTE — Telephone Encounter (Signed)
Pt called in stated his insurance will cover his medication would like a call back to discuss #[phone]

## 2022-10-12 DIAGNOSIS — L97512 Non-pressure chronic ulcer of other part of right foot with fat layer exposed: Secondary | ICD-10-CM | POA: Diagnosis not present

## 2022-10-12 DIAGNOSIS — I739 Peripheral vascular disease, unspecified: Secondary | ICD-10-CM | POA: Diagnosis not present

## 2022-10-18 ENCOUNTER — Telehealth: Payer: Self-pay | Admitting: Internal Medicine

## 2022-10-18 NOTE — Telephone Encounter (Signed)
Patient contacted the office regarding medication sent in by Dr. Alphonsus Sias. States his preferred pharmacy is unable to get mounjaro or wegovy. States the pharmacy told him they can get either ozempic or trulicity. Patient would like to know if either of those medications may be sent in for him to pick up. If needed advise [phone]. [facility].

## 2022-10-19 MED ORDER — OZEMPIC (0.25 OR 0.5 MG/DOSE) 2 MG/3ML ~~LOC~~ SOPN: 0.2500 mg | PEN_INJECTOR | SUBCUTANEOUS | 3 refills | Status: DC

## 2022-10-19 NOTE — Telephone Encounter (Signed)
Patient notified as instructed by telephone and verbalized understanding. 

## 2022-10-24 ENCOUNTER — Other Ambulatory Visit: Payer: Self-pay | Admitting: Internal Medicine

## 2022-10-24 DIAGNOSIS — I739 Peripheral vascular disease, unspecified: Secondary | ICD-10-CM | POA: Diagnosis not present

## 2022-10-24 DIAGNOSIS — L97512 Non-pressure chronic ulcer of other part of right foot with fat layer exposed: Secondary | ICD-10-CM | POA: Diagnosis not present

## 2022-10-24 NOTE — Telephone Encounter (Signed)
Prescription Request  10/24/2022  LOV: 10/03/2022  What is the name of the medication or equipment? HYDROcodone-acetaminophen (NORCO/VICODIN) 5-325 MG tablet   Have you contacted your pharmacy to request a refill? No   Which pharmacy would you like this sent to?  [facility] - [place], [place] - [address] [place] [address] Phone: [phone] Fax: [phone]     Patient notified that their request is being sent to the clinical staff for review and that they should receive a response within 2 business days.   Please advise at Mobile [phone] (mobile)

## 2022-10-24 NOTE — Telephone Encounter (Signed)
Called and spoke to pt. Advised him we do not have a dx of Pre-Diabetes or Diabetes for the Ozempic. I advised him that he will need to call around and ask for [facility]. If he finds a place, we can send it in. Once they process the rx, the PA will created and our PA group will take care of that. He will let us know if he finds a pharmacy that has it.

## 2022-10-24 NOTE — Telephone Encounter (Signed)
Spoke to pt. He will keep checking around.

## 2022-10-24 NOTE — Telephone Encounter (Signed)
Patient called the office regarding this issue, states the pharmacy he uses cannot get ozempic without a diagnosis code of diabetes or prediabetes. Wants to know if he has a diagnosis of prediabetes and if there is an associated code that Dr. Alphonsus Sias could send to the pharmacy. Patient also said if Dr. Alphonsus Sias or Carollee Herter knew of any pharmacies that may have mujuaro in stock as he had originally been prescribed. Patient would like a call back regarding this whenever possible, please advise [phone].

## 2022-10-24 NOTE — Telephone Encounter (Signed)
Patient called in and stated that he was able to find [facility] at [facility] in [place]. He stated that they are able to do 1.7 or 2.4. Thank you!

## 2022-10-24 NOTE — Telephone Encounter (Signed)
Name of Medication: Hydrocodone apap 5-325 mg Name of Pharmacy: [facility] Last Fill or Written Date and Quantity: 09-28-22 # 120 Last Office Visit and Type: 10-03-22 Next Office Visit and Type: 11-14-22 Last Controlled Substance Agreement Date: 02/21/22 Last UDS: 9/18/2

## 2022-11-02 ENCOUNTER — Encounter: Payer: Self-pay | Admitting: Internal Medicine

## 2022-11-02 DIAGNOSIS — L97512 Non-pressure chronic ulcer of other part of right foot with fat layer exposed: Secondary | ICD-10-CM | POA: Diagnosis not present

## 2022-11-02 DIAGNOSIS — I739 Peripheral vascular disease, unspecified: Secondary | ICD-10-CM | POA: Diagnosis not present

## 2022-11-02 NOTE — Assessment & Plan Note (Addendum)
Benefits from CPAP with good compliance and control He wanted to know about Inspire which we discussed in some detail. Plan-continue auto 10-20.  Referral for Inspire consideration.

## 2022-11-02 NOTE — Assessment & Plan Note (Signed)
Exam consistent with sinus rhythm at this visit.  He is followed by cardiology.

## 2022-11-02 NOTE — Assessment & Plan Note (Signed)
Continued efforts at diet/exercise

## 2022-11-09 DIAGNOSIS — L851 Acquired keratosis [keratoderma] palmaris et plantaris: Secondary | ICD-10-CM | POA: Diagnosis not present

## 2022-11-09 DIAGNOSIS — I739 Peripheral vascular disease, unspecified: Secondary | ICD-10-CM | POA: Diagnosis not present

## 2022-11-14 ENCOUNTER — Encounter: Payer: Self-pay | Admitting: Internal Medicine

## 2022-11-14 ENCOUNTER — Ambulatory Visit (INDEPENDENT_AMBULATORY_CARE_PROVIDER_SITE_OTHER): Payer: Medicare Other | Admitting: Internal Medicine

## 2022-11-14 VITALS — BP 110/78 | HR 77 | Temp 97.2°F | Ht 72.0 in | Wt 268.0 lb

## 2022-11-14 DIAGNOSIS — M48061 Spinal stenosis, lumbar region without neurogenic claudication: Secondary | ICD-10-CM

## 2022-11-14 DIAGNOSIS — Z6836 Body mass index (BMI) 36.0-36.9, adult: Secondary | ICD-10-CM

## 2022-11-14 DIAGNOSIS — F1120 Opioid dependence, uncomplicated: Secondary | ICD-10-CM

## 2022-11-14 NOTE — Assessment & Plan Note (Signed)
With chronic pain Gets every 3 month injections at [facility] Uses the hydrocodone 5/325 four times a day

## 2022-11-14 NOTE — Progress Notes (Signed)
Subjective:    Patient ID: Dakota Ellison, male    DOB: Aug 28, 1950, 72 y.o.   MRN: [id number]  HPI Here for follow up of chronic pain and narcotic dependence  Has cut out soda and watching calories Weight is down 20# in the past few months Wasn't able to get the wegovy  Back pain persists Gets injection every 3 months still--interventional radiologist at [facility] Uses the hydrocodone 4 times daily Hasn't been working recently--due to bunion of right foot. Procedures from podiatrist (even skin graft)  Current Outpatient Medications on File Prior to Visit  Medication Sig Dispense Refill   ALPRAZolam (XANAX) 0.5 MG tablet TAKE 1 TABLET BY MOUTH 3 TIMES DAILY AS NEEDED 90 tablet 0   apixaban (ELIQUIS) 5 MG TABS tablet Take 1 tablet (5 mg total) by mouth 2 (two) times daily. 60 tablet 11   Apple Cider Vinegar 500 MG TABS Take 500 mg by mouth daily.      cetirizine (ZYRTEC) 10 MG tablet Take 10 mg by mouth daily.     Cholecalciferol (VITAMIN D) 50 MCG (2000 UT) CAPS Take 1 capsule by mouth daily.     CINNAMON PO Take 200 mg by mouth daily.     ezetimibe (ZETIA) 10 MG tablet TAKE 1 TABLET BY MOUTH DAILY 90 tablet 3   fluticasone (FLONASE) 50 MCG/ACT nasal spray Place 2 sprays into both nostrils in the morning and at bedtime. 16 g 0   Garlic 2000 MG CAPS Take 2,000 mg by mouth 2 (two) times daily.      Ginkgo Biloba 120 MG CAPS Take 120 mg by mouth daily.     HYDROcodone-acetaminophen (NORCO/VICODIN) 5-325 MG tablet TAKE 1 TABLET BY MOUTH EVERY 6 HOURS AS NEEDED FOR MODERATE PAIN 120 tablet 0   loperamide (IMODIUM A-D) 2 MG tablet Take 2 tablets (4 mg total) by mouth 4 (four) times daily as needed for diarrhea or loose stools. 30 tablet 0   metoprolol succinate (TOPROL-XL) 50 MG 24 hr tablet Take 1 tablet (50 mg total) by mouth daily. 90 tablet 2   Multiple Vitamin (MULTIVITAMIN WITH MINERALS) TABS tablet Take 1 tablet by mouth daily.     niacin 500 MG CR capsule Take 500 mg by mouth  2 (two) times daily.     Omega-3 Fatty Acids (FISH OIL) 1200 MG CPDR Take 1,200 mg by mouth daily.     omeprazole (PRILOSEC) 20 MG capsule TAKE 1 CAPSULE BY MOUTH DAILY 90 capsule 3   sertraline (ZOLOFT) 25 MG tablet TAKE 1 TABLET BY MOUTH DAILY 90 tablet 3   sildenafil (VIAGRA) 100 MG tablet Take as directed (Patient taking differently: Take 100 mg by mouth daily as needed for erectile dysfunction. Take as directed) 10 tablet 5   SYSTANE ULTRA 0.4-0.3 % SOLN Place 1 drop into both eyes 4 (four) times daily as needed (dry/irritated eyes.).     testosterone cypionate (DEPOTESTOSTERONE CYPIONATE) 200 MG/ML injection Inject 200 mg into the muscle every 14 (fourteen) days. Saturdays     triamcinolone (KENALOG) 0.1 % Apply 1 application topically 2 (two) times daily as needed. 45 g 1   Turmeric (QC TUMERIC COMPLEX PO) Take 500 mg by mouth daily.     valsartan-hydrochlorothiazide (DIOVAN-HCT) 320-25 MG tablet TAKE 1 TABLET BY MOUTH DAILY 90 tablet 3   vitamin C (ASCORBIC ACID) 500 MG tablet Take 500 mg by mouth daily.     zinc gluconate 50 MG tablet Take 50 mg by mouth daily.  Semaglutide,0.25 or 0.5MG /DOS, (OZEMPIC, 0.25 OR 0.5 MG/DOSE,) 2 MG/3ML SOPN Inject 0.25 mg into the skin once a week. (Patient not taking: Reported on 11/14/2022) 3 mL 3   No current facility-administered medications on file prior to visit.    Allergies  Allergen Reactions   Atorvastatin Other (See Comments)    Muscle weakness in his legs   Crestor [Rosuvastatin] Other (See Comments)    Severe muscle cramping   Zocor [Simvastatin] Other (See Comments)    Muscle weakness in his legs   Atorvastatin Calcium Other (See Comments)    Past Medical History:  Diagnosis Date   Anxiety state 04/08/2014   denies   Complication of anesthesia    and trouble waking up   Degenerative disc disease    Dysthymia    denies   ED (erectile dysfunction)    GERD (gastroesophageal reflux disease)    History of MRSA infection    on  lip from cut   Hyperlipemia    Hypertension    take BP med for small leak in heart to keep it small per pt.   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   Procedure: LEFT TOTAL KNEE ARTHROPLASTY;  Surgeon: [name], MD;  Location: [facility];  Service: Orthopedics;  Laterality: Left;    TOTAL KNEE ARTHROPLASTY Right 12/17/2018   Procedure: TOTAL KNEE ARTHROPLASTY;  Surgeon: [name], MD;  Location: [facility];  Service: Orthopedics;  Laterality: Right;     Family History  Problem Relation Age of Onset   Pneumonia Brother     Social History   Socioeconomic History   Marital status: Divorced    Spouse name: Not on file   Number of children: 1   Years of education: Not on file   Highest education level: Not on file  Occupational History   Occupation: long distance driver    Comment: Retired   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, materials  Tobacco Use   Smoking status: Never    Passive exposure: Past   Smokeless tobacco: Never   Tobacco comments:    CIGARS ONLY--QUIT 2013  Vaping Use   Vaping Use: Never used  Substance and Sexual Activity   Alcohol use: Yes    Alcohol/week: 0.0 standard drinks of alcohol    Comment: rare//occassional   Drug use: No   Sexual activity: Yes  Other Topics Concern   Not on file  Social History Narrative   Has living will   Son is health care POA   Would accept resuscitation  attempts   Likely wouldn't want extended tube feeds if cognitively unaware   Social Determinants of Health   Financial Resource Strain: Not on file  Food Insecurity: Not on file  Transportation Needs: Not on file  Physical Activity:  Not on file  Stress: Not on file  Social Connections: Not on file  Intimate Partner Violence: Not on file   Review of Systems Sleeps fine Uses CPAP nightly (recent evaluation by Dr Maple Hudson)     Objective:   Physical Exam Constitutional:      Appearance: Normal appearance.  Neurological:     Mental Status: He is alert.  Psychiatric:        Mood and Affect: Mood normal.        Behavior: Behavior normal.            Assessment & Plan:

## 2022-11-14 NOTE — Assessment & Plan Note (Signed)
PDMP reviewed No concerns 

## 2022-11-14 NOTE — Assessment & Plan Note (Signed)
Has lost weight giving up sodas Still trying to find the wegovy

## 2022-11-23 ENCOUNTER — Other Ambulatory Visit: Payer: Self-pay | Admitting: Internal Medicine

## 2022-11-23 NOTE — Telephone Encounter (Signed)
Patient called in to check on the status of this medication being sent in for him.

## 2022-11-23 NOTE — Telephone Encounter (Signed)
Name of Medication: Hydrocodone apap 5-325 mg Name of Pharmacy: [facility] Last Fill or Written Date and Quantity: 10-24-22 # 120 Last Office Visit and Type: 11-14-22 Next Office Visit and Type: 02-13-23 Last Controlled Substance Agreement Date: 02/21/22 Last UDS: 02/21/22

## 2022-11-23 NOTE — Telephone Encounter (Signed)
We just received the request 2 hours ago and Dr Alphonsus Sias is with pts. It will be taken care when he is done with his morning patients.

## 2022-12-19 ENCOUNTER — Other Ambulatory Visit: Payer: Self-pay | Admitting: Internal Medicine

## 2022-12-19 NOTE — Telephone Encounter (Signed)
Name of Medication: Hydrocodone apap 5-325 mg Name of Pharmacy: [facility] Last Fill or Written Date and Quantity: 11-23-22 # 120 Last Office Visit and Type: 11-14-22 Next Office Visit and Type: 02-13-23 Last Controlled Substance Agreement Date: 02/21/22 Last UDS: 02/21/22

## 2022-12-26 DIAGNOSIS — M216X1 Other acquired deformities of right foot: Secondary | ICD-10-CM | POA: Diagnosis not present

## 2022-12-26 DIAGNOSIS — M2021 Hallux rigidus, right foot: Secondary | ICD-10-CM | POA: Diagnosis not present

## 2022-12-26 DIAGNOSIS — L851 Acquired keratosis [keratoderma] palmaris et plantaris: Secondary | ICD-10-CM | POA: Diagnosis not present

## 2022-12-26 DIAGNOSIS — M21621 Bunionette of right foot: Secondary | ICD-10-CM | POA: Diagnosis not present

## 2022-12-26 DIAGNOSIS — M19071 Primary osteoarthritis, right ankle and foot: Secondary | ICD-10-CM | POA: Diagnosis not present

## 2022-12-26 DIAGNOSIS — I739 Peripheral vascular disease, unspecified: Secondary | ICD-10-CM | POA: Diagnosis not present

## 2023-01-02 ENCOUNTER — Other Ambulatory Visit: Payer: Self-pay | Admitting: Internal Medicine

## 2023-01-02 DIAGNOSIS — M545 Low back pain, unspecified: Secondary | ICD-10-CM | POA: Diagnosis not present

## 2023-01-02 NOTE — Telephone Encounter (Signed)
Last filled 10-03-22 #90 Last OV 11-14-22 Next 02-13-23 [facility]

## 2023-01-16 ENCOUNTER — Other Ambulatory Visit: Payer: Self-pay | Admitting: Internal Medicine

## 2023-01-16 NOTE — Telephone Encounter (Signed)
Name of Medication: Hydrocodone apap 5-325 mg Name of Pharmacy: [facility] Last Fill or Written Date and Quantity: 12-19-22 # 120 Last Office Visit and Type: 11-14-22 Next Office Visit and Type: 02-13-23 Last Controlled Substance Agreement Date: 02/21/22 Last UDS: 02/21/22

## 2023-01-19 ENCOUNTER — Encounter (INDEPENDENT_AMBULATORY_CARE_PROVIDER_SITE_OTHER): Payer: Self-pay

## 2023-01-27 ENCOUNTER — Other Ambulatory Visit: Payer: Self-pay | Admitting: Internal Medicine

## 2023-01-27 ENCOUNTER — Telehealth: Payer: Self-pay | Admitting: Cardiovascular Disease

## 2023-01-27 DIAGNOSIS — I4819 Other persistent atrial fibrillation: Secondary | ICD-10-CM

## 2023-01-27 MED ORDER — APIXABAN 5 MG PO TABS
5.0000 mg | ORAL_TABLET | Freq: Two times a day (BID) | ORAL | 1 refills | Status: DC
Start: 2023-01-27 — End: 2023-01-30

## 2023-01-27 NOTE — Telephone Encounter (Signed)
Prescription refill request for Eliquis received. Indication: Afib  Last office visit: 03/21/22 Mariah Milling)  Scr: 1.02 (08/15/22)  Age: 72 Weight: 121.6kg  Appropriate dose. Refill sent.

## 2023-01-27 NOTE — Telephone Encounter (Signed)
Please review

## 2023-01-27 NOTE — Telephone Encounter (Signed)
*  STAT* If patient is at the pharmacy, call can be transferred to refill team.   1. Which medications need to be refilled? (please list name of each medication and dose if known)   apixaban (ELIQUIS) 5 MG TABS tablet   2. Would you like to learn more about the convenience, safety, & potential cost savings by using the [facility]?   3. Are you open to using the [facility] (Type [facility]. ).  4. Which pharmacy/location (including street and city if local pharmacy) is medication to be sent to?  [facility] - [place], [place] - [address] [address]   5. Do they need a 30 day or 90 day supply?   90 day  Patient stated he will be out of this medication on Sunday.

## 2023-02-01 ENCOUNTER — Other Ambulatory Visit: Payer: Self-pay | Admitting: Internal Medicine

## 2023-02-07 ENCOUNTER — Ambulatory Visit: Payer: Medicare Other | Admitting: Internal Medicine

## 2023-02-08 ENCOUNTER — Other Ambulatory Visit: Payer: Self-pay

## 2023-02-08 MED ORDER — METOPROLOL SUCCINATE ER 50 MG PO TB24: 50.0000 mg | ORAL_TABLET | Freq: Every day | ORAL | 3 refills | Status: DC

## 2023-02-08 NOTE — Telephone Encounter (Signed)
Received fax from [place] for refill on patient metoprolol looks like was given by Dr. Mariah Milling.

## 2023-02-13 ENCOUNTER — Encounter: Payer: Self-pay | Admitting: Internal Medicine

## 2023-02-13 ENCOUNTER — Ambulatory Visit (INDEPENDENT_AMBULATORY_CARE_PROVIDER_SITE_OTHER): Payer: Medicare Other | Admitting: Internal Medicine

## 2023-02-13 VITALS — BP 124/80 | HR 70 | Temp 97.6°F | Ht 72.0 in | Wt 269.0 lb

## 2023-02-13 DIAGNOSIS — Z23 Encounter for immunization: Secondary | ICD-10-CM | POA: Diagnosis not present

## 2023-02-13 DIAGNOSIS — M48061 Spinal stenosis, lumbar region without neurogenic claudication: Secondary | ICD-10-CM | POA: Diagnosis not present

## 2023-02-13 DIAGNOSIS — F1120 Opioid dependence, uncomplicated: Secondary | ICD-10-CM | POA: Diagnosis not present

## 2023-02-13 MED ORDER — HYDROCODONE-ACETAMINOPHEN 5-325 MG PO TABS: 1.0000 | ORAL_TABLET | Freq: Four times a day (QID) | ORAL | 0 refills | Status: DC | PRN

## 2023-02-13 NOTE — Progress Notes (Signed)
Subjective:    Patient ID: Dakota Ellison, male    DOB: 1951/03/31, 72 y.o.   MRN: [id number]  HPI Here for follow up of chronic pain and narcotic dependence  Did get a back injection 7/29 They do help but never last the full 3 months Still needs the hydrocodone regularly 4 times a day---every 4 hours during waking hours  Mood has been fine  Current Outpatient Medications on File Prior to Visit  Medication Sig Dispense Refill   ALPRAZolam (XANAX) 0.5 MG tablet TAKE 1 TABLET BY MOUTH 3 TIMES DAILY AS NEEDED 90 tablet 0   cetirizine (ZYRTEC) 10 MG tablet Take 10 mg by mouth daily.     Cholecalciferol (VITAMIN D) 50 MCG (2000 UT) CAPS Take 1 capsule by mouth daily.     CINNAMON PO Take 200 mg by mouth daily.     ELIQUIS 5 MG TABS tablet Take 1 tablet (5 mg total) by mouth 2 (two) times daily. 60 tablet 11   ezetimibe (ZETIA) 10 MG tablet TAKE 1 TABLET BY MOUTH DAILY 90 tablet 3   Garlic 2000 MG CAPS Take 2,000 mg by mouth 2 (two) times daily.      Ginkgo Biloba 120 MG CAPS Take 120 mg by mouth daily.     HYDROcodone-acetaminophen (NORCO/VICODIN) 5-325 MG tablet TAKE 1 TABLET BY MOUTH EVERY 6 HOURS AS NEEDED FOR MODERATE PAIN 120 tablet 0   metoprolol succinate (TOPROL-XL) 50 MG 24 hr tablet Take 1 tablet (50 mg total) by mouth daily. 90 tablet 3   Multiple Vitamin (MULTIVITAMIN WITH MINERALS) TABS tablet Take 1 tablet by mouth daily.     niacin 500 MG CR capsule Take 500 mg by mouth 2 (two) times daily.     Omega-3 Fatty Acids (FISH OIL) 1200 MG CPDR Take 1,200 mg by mouth daily.     omeprazole (PRILOSEC) 20 MG capsule TAKE 1 CAPSULE BY MOUTH DAILY 90 capsule 3   sertraline (ZOLOFT) 25 MG tablet TAKE 1 TABLET BY MOUTH DAILY 90 tablet 3   sildenafil (VIAGRA) 100 MG tablet Take as directed (Patient taking differently: Take 100 mg by mouth daily as needed for erectile dysfunction. Take as directed) 10 tablet 5   SYSTANE ULTRA 0.4-0.3 % SOLN Place 1 drop into both eyes 4 (four) times daily  as needed (dry/irritated eyes.).     testosterone cypionate (DEPOTESTOSTERONE CYPIONATE) 200 MG/ML injection Inject 200 mg into the muscle every 14 (fourteen) days. Saturdays     triamcinolone (KENALOG) 0.1 % Apply 1 application topically 2 (two) times daily as needed. 45 g 1   Turmeric (QC TUMERIC COMPLEX PO) Take 500 mg by mouth daily.     valsartan-hydrochlorothiazide (DIOVAN-HCT) 320-25 MG tablet TAKE 1 TABLET BY MOUTH DAILY 90 tablet 3   vitamin C (ASCORBIC ACID) 500 MG tablet Take 500 mg by mouth daily.     zinc gluconate 50 MG tablet Take 50 mg by mouth daily.     No current facility-administered medications on file prior to visit.    Allergies  Allergen Reactions   Atorvastatin Other (See Comments)    Muscle weakness in his legs   Crestor [Rosuvastatin] Other (See Comments)    Severe muscle cramping   Zocor [Simvastatin] Other (See Comments)    Muscle weakness in his legs   Atorvastatin Calcium Other (See Comments)    Past Medical History:  Diagnosis Date   Anxiety state 04/08/2014   denies   Complication of anesthesia  and trouble waking up   Degenerative disc disease    Dysthymia    denies   ED (erectile dysfunction)    GERD (gastroesophageal reflux disease)    History of MRSA infection    on lip from cut   Hyperlipemia    Hypertension    take BP med for small leak in heart to keep it small per pt.   Mild asthma    as a teen   Morbid obesity (HCC)    OSA (obstructive sleep apnea)    Osteoarthritis    PONV (postoperative nausea and vomiting)    Rectal fissure    Testosterone deficiency     Past Surgical History:  Procedure Laterality Date   HERNIA REPAIR     1998   KNEE SURGERY  09/2010   left knee and right knee arthroscopy   OTHER SURGICAL HISTORY     arthroscopic subacromial decompression   OTHER SURGICAL HISTORY     open resection, distal right clavicle   SHOULDER ARTHROSCOPY     right   SPHINCTEROTOMY     TOTAL KNEE ARTHROPLASTY Left  06/11/2018   Procedure: LEFT TOTAL KNEE ARTHROPLASTY;  Surgeon: [name], MD;  Location: [facility];  Service: Orthopedics;  Laterality: Left;    TOTAL KNEE ARTHROPLASTY Right 12/17/2018   Procedure: TOTAL KNEE ARTHROPLASTY;  Surgeon: [name], MD;  Location: [facility];  Service: Orthopedics;  Laterality: Right;     Family History  Problem Relation Age of Onset   Pneumonia Brother     Social History   Socioeconomic History   Marital status: Divorced    Spouse name: Not on file   Number of children: 1   Years of education: Not on file   Highest education level: Not on file  Occupational History   Occupation: long distance driver    Comment: Retired   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, materials  Tobacco Use   Smoking status: Never    Passive exposure: Past   Smokeless tobacco: Never   Tobacco comments:    CIGARS ONLY--QUIT 2013  Vaping Use   Vaping status: Never Used  Substance and Sexual Activity   Alcohol use: Yes    Alcohol/week: 0.0 standard drinks of alcohol    Comment: rare//occassional   Drug use: No   Sexual activity: Yes  Other Topics Concern   Not on file  Social History Narrative   Has living will   Son is health care POA   Would accept resuscitation attempts   Likely wouldn't want extended tube feeds if cognitively unaware   Social Determinants of Health   Financial Resource Strain: Not on file  Food Insecurity: Not on file  Transportation Needs: Not on file  Physical Activity: Not on file  Stress: Not on file  Social Connections: Not on file  Intimate Partner Violence: Not on file   Review of Systems Does sleep okay--uses CPAP Weight stable--hasn't gained back the weight    Objective:   Physical Exam Constitutional:      Appearance: Normal appearance.  Neurological:     Mental Status: He is alert.  Psychiatric:        Mood and Affect: Mood normal.        Behavior: Behavior normal.             Assessment & Plan:

## 2023-02-13 NOTE — Addendum Note (Signed)
Addended by: Eual Fines on: 02/13/2023 12:35 PM   Modules accepted: Orders

## 2023-02-13 NOTE — Assessment & Plan Note (Signed)
PDMP reviewed No concerns 

## 2023-02-13 NOTE — Assessment & Plan Note (Signed)
With chronic pain Still with epidural injections every 3 months Hydrocodone 5/325 four times daily

## 2023-02-20 DIAGNOSIS — M205X1 Other deformities of toe(s) (acquired), right foot: Secondary | ICD-10-CM | POA: Diagnosis not present

## 2023-02-20 DIAGNOSIS — I739 Peripheral vascular disease, unspecified: Secondary | ICD-10-CM | POA: Diagnosis not present

## 2023-02-20 DIAGNOSIS — M19071 Primary osteoarthritis, right ankle and foot: Secondary | ICD-10-CM | POA: Diagnosis not present

## 2023-02-20 DIAGNOSIS — M79671 Pain in right foot: Secondary | ICD-10-CM | POA: Diagnosis not present

## 2023-02-20 DIAGNOSIS — M109 Gout, unspecified: Secondary | ICD-10-CM | POA: Diagnosis not present

## 2023-03-13 DIAGNOSIS — M5136 Other intervertebral disc degeneration, lumbar region with discogenic back pain only: Secondary | ICD-10-CM | POA: Diagnosis not present

## 2023-03-13 DIAGNOSIS — M9904 Segmental and somatic dysfunction of sacral region: Secondary | ICD-10-CM | POA: Diagnosis not present

## 2023-03-13 DIAGNOSIS — M9903 Segmental and somatic dysfunction of lumbar region: Secondary | ICD-10-CM | POA: Diagnosis not present

## 2023-03-13 DIAGNOSIS — M9905 Segmental and somatic dysfunction of pelvic region: Secondary | ICD-10-CM | POA: Diagnosis not present

## 2023-03-14 ENCOUNTER — Other Ambulatory Visit: Payer: Self-pay | Admitting: Internal Medicine

## 2023-03-15 NOTE — Telephone Encounter (Signed)
Name of Medication: Hydrocodone apap 5-325 mg Name of Pharmacy: [facility] [facility] Last Fill or Written Date and Quantity: 02-13-23 # 120 Last Office Visit and Type: 02-13-23 Next Office Visit and Type: 05-15-23 Last Controlled Substance Agreement Date: 02/21/22 Last UDS: 02/21/22

## 2023-03-20 DIAGNOSIS — M5136 Other intervertebral disc degeneration, lumbar region with discogenic back pain only: Secondary | ICD-10-CM | POA: Diagnosis not present

## 2023-03-20 DIAGNOSIS — M9904 Segmental and somatic dysfunction of sacral region: Secondary | ICD-10-CM | POA: Diagnosis not present

## 2023-03-20 DIAGNOSIS — M9903 Segmental and somatic dysfunction of lumbar region: Secondary | ICD-10-CM | POA: Diagnosis not present

## 2023-03-20 DIAGNOSIS — M9905 Segmental and somatic dysfunction of pelvic region: Secondary | ICD-10-CM | POA: Diagnosis not present

## 2023-03-25 NOTE — Progress Notes (Unsigned)
Cardiology Office Note  Date:  03/27/2023   ID:  Dakota, Ellison Jul 11, 1950, MRN [id number]  PCP:  [name], MD   Chief Complaint  Patient presents with   12 month follow up     "Doing well." Medications reviewed by the patient verbally.     HPI:  [name] is a pleasant 72 year old gentleman with a history of  obesity,  obstructive sleep apnea, on CPAP Hypertension, hyperlipidemia  CT coronary calcium score October 2019 of 0 No aortic atherosclerosis who presents for routine followup of his hypertension and hyperlipidemia, atrial fibrillation self converting  LOV 10/23 In follow-up today reports he feels well with no complaints Changed diet, weight down: >20 pounds over the past year Cut caffeine, cut out sugars, carbohydrates Remains active at baseline, continues to work  No sx of afib Chronic back pain, gets shots, Uses pain meds to control the pain to help avoid surgery on his back Goes to massage therapy, chiropractor for his back No regular exercise program Likes to chop wood and stay active  EKG personally reviewed by myself on todays visit EKG Interpretation Date/Time:  Monday March 27 2023 11:36:32 EDT Ventricular Rate:  68 PR Interval:  134 QRS Duration:  90 QT Interval:  378 QTC Calculation: 401 R Axis:   -5  Text Interpretation: Normal sinus rhythm Normal ECG When compared with ECG of 30-Mar-2020 11:25, Premature ventricular complexes are no longer Present Premature atrial complexes are no longer Present Confirmed by [name] ([id number]) on 03/27/2023 11:40:25 AM    ER 01/19/2022,  afib Had general malaise EKG showed Afib 109 bpm. Started on Toprol and Eliquis. metoprolol succinate  to 50 mg  converted to NSR without intervention  Continues to work for [employer] department in [name]  Chronic back issues,DJD, Declined surgery Has had cortisone at [facility], once every 3 months  Simple cyst arising from upper pole left kidney  measuring 4.3 x 7.0 x 5.6 cm,  Being monitored  covid in Aug 2020 Treated with steroids through ER  "weird taste", still not back all the way  Chronic orthopedic issues, back and knee bilaterally B/l knee replacements  06/2018 (l), 12/2018 (r)  Problems tolerating statins including Crestor, simvastatin, Lipitor Able to tolerate Zetia Previously with myalgias on the others  previous lower extremity ultrasound. By his report, this was normal with no evidence of PAD.   Last echocardiogram in March 2008 showing normal systolic function, right ventricle mildly dilated, mild mitral regurg, mild tricuspid regurg. Last stress test in March 2008 where he achieved 10 mets, exercised for 9 minutes, no ischemia noted. Overall a normal,  low-risk scan.    PMH:   has a past medical history of Anxiety state (04/08/2014), Complication of anesthesia, Degenerative disc disease, Dysthymia, ED (erectile dysfunction), GERD (gastroesophageal reflux disease), History of MRSA infection, Hyperlipemia, Hypertension, Mild asthma, Morbid obesity (HCC), OSA (obstructive sleep apnea), Osteoarthritis, PONV (postoperative nausea and vomiting), Rectal fissure, and Testosterone deficiency.  PSH:    Past Surgical History:  Procedure Laterality Date   HERNIA REPAIR     1998   KNEE SURGERY  09/2010   left knee and right knee arthroscopy   OTHER SURGICAL HISTORY     arthroscopic subacromial decompression   OTHER SURGICAL HISTORY     open resection, distal right clavicle   SHOULDER ARTHROSCOPY     right   SPHINCTEROTOMY     TOTAL KNEE ARTHROPLASTY Left 06/11/2018   Procedure: LEFT TOTAL KNEE ARTHROPLASTY;  Surgeon:  [name], MD;  Location: [facility];  Service: Orthopedics;  Laterality: Left;    TOTAL KNEE ARTHROPLASTY Right 12/17/2018   Procedure: TOTAL KNEE ARTHROPLASTY;  Surgeon: [name], MD;  Location: [facility];  Service: Orthopedics;  Laterality: Right;     Current Outpatient Medications   Medication Sig Dispense Refill   allopurinol (ZYLOPRIM) 100 MG tablet Take 100 mg by mouth daily.     ALPRAZolam (XANAX) 0.5 MG tablet TAKE 1 TABLET BY MOUTH 3 TIMES DAILY AS NEEDED 90 tablet 0   cetirizine (ZYRTEC) 10 MG tablet Take 10 mg by mouth daily.     Cholecalciferol (VITAMIN D) 50 MCG (2000 UT) CAPS Take 1 capsule by mouth daily.     CINNAMON PO Take 200 mg by mouth daily.     ELIQUIS 5 MG TABS tablet Take 1 tablet (5 mg total) by mouth 2 (two) times daily. 60 tablet 11   ezetimibe (ZETIA) 10 MG tablet TAKE 1 TABLET BY MOUTH DAILY 90 tablet 3   Garlic 2000 MG CAPS Take 2,000 mg by mouth 2 (two) times daily.      Ginkgo Biloba 120 MG CAPS Take 120 mg by mouth daily.     HYDROcodone-acetaminophen (NORCO/VICODIN) 5-325 MG tablet TAKE 1 TABLET BY MOUTH EVERY 6 HOURS AS NEEDED FOR MODERATE PAIN 120 tablet 0   metoprolol succinate (TOPROL-XL) 50 MG 24 hr tablet Take 1 tablet (50 mg total) by mouth daily. 90 tablet 3   Multiple Vitamin (MULTIVITAMIN WITH MINERALS) TABS tablet Take 1 tablet by mouth daily.     niacin 500 MG CR capsule Take 500 mg by mouth 2 (two) times daily.     Omega-3 Fatty Acids (FISH OIL) 1200 MG CPDR Take 1,200 mg by mouth daily.     omeprazole (PRILOSEC) 20 MG capsule TAKE 1 CAPSULE BY MOUTH DAILY 90 capsule 3   sertraline (ZOLOFT) 25 MG tablet TAKE 1 TABLET BY MOUTH DAILY 90 tablet 3   sildenafil (VIAGRA) 100 MG tablet Take as directed (Patient taking differently: Take 100 mg by mouth daily as needed for erectile dysfunction. Take as directed) 10 tablet 5   SYSTANE ULTRA 0.4-0.3 % SOLN Place 1 drop into both eyes 4 (four) times daily as needed (dry/irritated eyes.).     testosterone cypionate (DEPOTESTOSTERONE CYPIONATE) 200 MG/ML injection Inject 200 mg into the muscle every 14 (fourteen) days. Saturdays     Turmeric (QC TUMERIC COMPLEX PO) Take 500 mg by mouth daily.     valsartan-hydrochlorothiazide (DIOVAN-HCT) 320-25 MG tablet TAKE 1 TABLET BY MOUTH DAILY 90  tablet 3   vitamin C (ASCORBIC ACID) 500 MG tablet Take 500 mg by mouth daily.     zinc gluconate 50 MG tablet Take 50 mg by mouth daily.     triamcinolone (KENALOG) 0.1 % Apply 1 application topically 2 (two) times daily as needed. (Patient not taking: Reported on 03/27/2023) 45 g 1   No current facility-administered medications for this visit.   Allergies:   Atorvastatin, Crestor [rosuvastatin], Zocor [simvastatin], and Atorvastatin calcium   Social History:  The patient  reports that he has never smoked. He has been exposed to tobacco smoke. He has never used smokeless tobacco. He reports current alcohol use. He reports that he does not use drugs.   Family History:   family history includes Pneumonia in his brother.   Review of Systems: Review of Systems  Constitutional: Negative.   Respiratory: Negative.    Cardiovascular: Negative.   Gastrointestinal:  Negative.   Musculoskeletal:  Positive for back pain and joint pain.  Neurological: Negative.   Psychiatric/Behavioral: Negative.    All other systems reviewed and are negative.  PHYSICAL EXAM: VS:  BP 126/70 (BP Location: Left Arm, Patient Position: Sitting, Cuff Size: Normal)   Pulse 68   Ht 6' (1.829 m)   Wt 268 lb 6 oz (121.7 kg)   SpO2 97%   BMI 36.40 kg/m  , BMI Body mass index is 36.4 kg/m. Constitutional:  oriented to person, place, and time. No distress.  HENT:  Head: Grossly normal Eyes:  no discharge. No scleral icterus.  Neck: No JVD, no carotid bruits  Cardiovascular: Regular rate and rhythm, no murmurs appreciated Pulmonary/Chest: Clear to auscultation bilaterally, no wheezes or rails Abdominal: Soft.  no distension.  no tenderness.  Musculoskeletal: Normal range of motion Neurological:  normal muscle tone. Coordination normal. No atrophy Skin: Skin warm and dry Psychiatric: normal affect, pleasant  Recent Labs: 08/15/2022: ALT 30; BUN 14; Creatinine, Ser 1.02; Hemoglobin 16.7; Platelets 281.0; Potassium  3.9; Sodium 137; TSH 5.99   Lipid Panel Lab Results  Component Value Date   CHOL 138 08/15/2022   HDL 31.30 (L) 08/15/2022   LDLCALC 64 05/11/2021   TRIG (H) 08/15/2022    486.0 Triglyceride is over 400; calculations on Lipids are invalid.     Wt Readings from Last 3 Encounters:  03/27/23 268 lb 6 oz (121.7 kg)  02/13/23 269 lb (122 kg)  11/14/22 268 lb (121.6 kg)     ASSESSMENT AND PLAN: Paroxysmal atrial fibrillation Maintaining normal sinus rhythm, asymptomatic from paroxysmal A-fib Not aware of having any episodes Continue metoprolol succinate 50 daily and Eliquis Tolerating CPAP, weight trending downward  Essential hypertension -  Blood pressure is well controlled on today's visit. No changes made to the medications.  Pure hypercholesterolemia Myalgias on statin Tolerating Zetia, numbers at goal Calcium score 0  Obstructive sleep apnea on CPAP Tolerating his CPAP, compliant Weight down more than 20 pounds over the past year through dietary changes  Chronic back pain cortisone shot at [facility] every 3 months Hoping that he does not need back surgery, uses a pain medication as needed  Gait instability, Exacerbated by back pain    Orders Placed This Encounter  Procedures   EKG 12-Lead      Signed, Dossie Arbour, M.D., Ph.D. 03/27/2023  [facility] [place], [place] [phone]

## 2023-03-27 ENCOUNTER — Ambulatory Visit: Payer: Medicare Other | Attending: Cardiovascular Disease | Admitting: Cardiovascular Disease

## 2023-03-27 ENCOUNTER — Encounter: Payer: Self-pay | Admitting: Cardiovascular Disease

## 2023-03-27 VITALS — BP 126/70 | HR 68 | Ht 72.0 in | Wt 268.4 lb

## 2023-03-27 DIAGNOSIS — T466X5D Adverse effect of antihyperlipidemic and antiarteriosclerotic drugs, subsequent encounter: Secondary | ICD-10-CM

## 2023-03-27 DIAGNOSIS — G4733 Obstructive sleep apnea (adult) (pediatric): Secondary | ICD-10-CM

## 2023-03-27 DIAGNOSIS — I4819 Other persistent atrial fibrillation: Secondary | ICD-10-CM | POA: Diagnosis not present

## 2023-03-27 DIAGNOSIS — I10 Essential (primary) hypertension: Secondary | ICD-10-CM

## 2023-03-27 DIAGNOSIS — R0789 Other chest pain: Secondary | ICD-10-CM

## 2023-03-27 DIAGNOSIS — E782 Mixed hyperlipidemia: Secondary | ICD-10-CM

## 2023-03-27 DIAGNOSIS — M791 Myalgia, unspecified site: Secondary | ICD-10-CM

## 2023-03-27 NOTE — Patient Instructions (Signed)

## 2023-04-12 ENCOUNTER — Other Ambulatory Visit: Payer: Self-pay | Admitting: Internal Medicine

## 2023-04-12 MED ORDER — HYDROCODONE-ACETAMINOPHEN 5-325 MG PO TABS: 1.0000 | ORAL_TABLET | Freq: Four times a day (QID) | ORAL | 0 refills | Status: DC | PRN

## 2023-04-12 NOTE — Telephone Encounter (Signed)
Prescription Request  04/12/2023  LOV: 02/13/2023  What is the name of the medication or equipment? HYDROcodone-acetaminophen (NORCO/VICODIN) 5-325 MG tablet, will be out today  Have you contacted your pharmacy to request a refill? Yes   Which pharmacy would you like this sent to?  [facility] - [place], [place] - [address] [place] [address] Phone: [phone] Fax: [phone]   Patient notified that their request is being sent to the clinical staff for review and that they should receive a response within 2 business days.   Please advise at Mobile [phone] (mobile)

## 2023-04-12 NOTE — Telephone Encounter (Signed)
Name of Medication: Hydrocodone apap 5-325 mg Name of Pharmacy: [facility] Last Fill or Written Date and Quantity: 03-15-23 # 120 Last Office Visit and Type: 02-13-23 Next Office Visit and Type: 05-15-23 Last Controlled Substance Agreement Date: 02/21/22 Last UDS: 02/21/22

## 2023-04-24 DIAGNOSIS — M79671 Pain in right foot: Secondary | ICD-10-CM | POA: Diagnosis not present

## 2023-04-24 DIAGNOSIS — M109 Gout, unspecified: Secondary | ICD-10-CM | POA: Diagnosis not present

## 2023-05-01 DIAGNOSIS — E291 Testicular hypofunction: Secondary | ICD-10-CM | POA: Diagnosis not present

## 2023-05-01 DIAGNOSIS — R948 Abnormal results of function studies of other organs and systems: Secondary | ICD-10-CM | POA: Diagnosis not present

## 2023-05-08 DIAGNOSIS — E291 Testicular hypofunction: Secondary | ICD-10-CM | POA: Diagnosis not present

## 2023-05-08 DIAGNOSIS — N5201 Erectile dysfunction due to arterial insufficiency: Secondary | ICD-10-CM | POA: Diagnosis not present

## 2023-05-09 ENCOUNTER — Other Ambulatory Visit: Payer: Self-pay | Admitting: Internal Medicine

## 2023-05-09 NOTE — Telephone Encounter (Signed)
hydrocodone 5 mg-acetaminophen 325 mg Last seen 02/13/23 Follow up 05/15/23 last given 04/12/23 #120 no rf

## 2023-05-15 ENCOUNTER — Encounter: Payer: Self-pay | Admitting: Internal Medicine

## 2023-05-15 ENCOUNTER — Ambulatory Visit (INDEPENDENT_AMBULATORY_CARE_PROVIDER_SITE_OTHER): Payer: Medicare Other | Admitting: Internal Medicine

## 2023-05-15 VITALS — BP 122/74 | HR 82 | Temp 98.5°F | Ht 72.0 in | Wt 271.0 lb

## 2023-05-15 DIAGNOSIS — M48061 Spinal stenosis, lumbar region without neurogenic claudication: Secondary | ICD-10-CM | POA: Diagnosis not present

## 2023-05-15 DIAGNOSIS — F1120 Opioid dependence, uncomplicated: Secondary | ICD-10-CM | POA: Diagnosis not present

## 2023-05-15 NOTE — Progress Notes (Signed)
Subjective:    Patient ID: Dakota Ellison, male    DOB: 1950/10/30, 72 y.o.   MRN: [id number]  HPI Here for follow up of chronic pain and narcotic dependence  Doing okay Did just lose a former coworker to cancer---funeral today  Ongoing pain Uses the hydrocodone four daily [facility] pain program for epidural injections every 3 months Tries to do yard and house work--but has to be careful  Uses the xanax in the morning---feels he would do better with adding another in the evening This is okay  Current Outpatient Medications on File Prior to Visit  Medication Sig Dispense Refill   allopurinol (ZYLOPRIM) 100 MG tablet Take 100 mg by mouth daily.     ALPRAZolam (XANAX) 0.5 MG tablet TAKE 1 TABLET BY MOUTH 3 TIMES DAILY AS NEEDED 90 tablet 0   cetirizine (ZYRTEC) 10 MG tablet Take 10 mg by mouth daily.     Cholecalciferol (VITAMIN D) 50 MCG (2000 UT) CAPS Take 1 capsule by mouth daily.     CINNAMON PO Take 200 mg by mouth daily.     ELIQUIS 5 MG TABS tablet Take 1 tablet (5 mg total) by mouth 2 (two) times daily. 60 tablet 11   ezetimibe (ZETIA) 10 MG tablet TAKE 1 TABLET BY MOUTH DAILY 90 tablet 3   Garlic 2000 MG CAPS Take 2,000 mg by mouth 2 (two) times daily.      Ginkgo Biloba 120 MG CAPS Take 120 mg by mouth daily.     HYDROcodone-acetaminophen (NORCO/VICODIN) 5-325 MG tablet Take 1 tablet by mouth every 6 (six) hours as needed for moderate pain (pain score 4-6). 120 tablet 0   metoprolol succinate (TOPROL-XL) 50 MG 24 hr tablet Take 1 tablet (50 mg total) by mouth daily. 90 tablet 3   Multiple Vitamin (MULTIVITAMIN WITH MINERALS) TABS tablet Take 1 tablet by mouth daily.     niacin 500 MG CR capsule Take 500 mg by mouth 2 (two) times daily.     Omega-3 Fatty Acids (FISH OIL) 1200 MG CPDR Take 1,200 mg by mouth daily.     omeprazole (PRILOSEC) 20 MG capsule TAKE 1 CAPSULE BY MOUTH DAILY 90 capsule 3   sertraline (ZOLOFT) 25 MG tablet TAKE 1 TABLET BY MOUTH DAILY 90 tablet 3    sildenafil (VIAGRA) 100 MG tablet Take as directed (Patient taking differently: Take 100 mg by mouth daily as needed for erectile dysfunction. Take as directed) 10 tablet 5   SYSTANE ULTRA 0.4-0.3 % SOLN Place 1 drop into both eyes 4 (four) times daily as needed (dry/irritated eyes.).     testosterone cypionate (DEPOTESTOSTERONE CYPIONATE) 200 MG/ML injection Inject 200 mg into the muscle every 14 (fourteen) days. Saturdays     triamcinolone (KENALOG) 0.1 % Apply 1 application topically 2 (two) times daily as needed. 45 g 1   Turmeric (QC TUMERIC COMPLEX PO) Take 500 mg by mouth daily.     valsartan-hydrochlorothiazide (DIOVAN-HCT) 320-25 MG tablet TAKE 1 TABLET BY MOUTH DAILY 90 tablet 3   vitamin C (ASCORBIC ACID) 500 MG tablet Take 500 mg by mouth daily.     zinc gluconate 50 MG tablet Take 50 mg by mouth daily.     No current facility-administered medications on file prior to visit.    Allergies  Allergen Reactions   Atorvastatin Other (See Comments)    Muscle weakness in his legs   Crestor [Rosuvastatin] Other (See Comments)    Severe muscle cramping   Zocor [Simvastatin]  Other (See Comments)    Muscle weakness in his legs   Atorvastatin Calcium Other (See Comments)    Past Medical History:  Diagnosis Date   Anxiety state 04/08/2014   denies   Complication of anesthesia    and trouble waking up   Degenerative disc disease    Dysthymia    denies   ED (erectile dysfunction)    GERD (gastroesophageal reflux disease)    History of MRSA infection    on lip from cut   Hyperlipemia    Hypertension    take BP med for small leak in heart to keep it small per pt.   Mild asthma    as a teen   Morbid obesity (HCC)    OSA (obstructive sleep apnea)    Osteoarthritis    PONV (postoperative nausea and vomiting)    Rectal fissure    Testosterone deficiency     Past Surgical History:  Procedure Laterality Date   HERNIA REPAIR     1998   KNEE SURGERY  09/2010   left knee and  right knee arthroscopy   OTHER SURGICAL HISTORY     arthroscopic subacromial decompression   OTHER SURGICAL HISTORY     open resection, distal right clavicle   SHOULDER ARTHROSCOPY     right   SPHINCTEROTOMY     TOTAL KNEE ARTHROPLASTY Left 06/11/2018   Procedure: LEFT TOTAL KNEE ARTHROPLASTY;  Surgeon: [name], MD;  Location: [facility];  Service: Orthopedics;  Laterality: Left;    TOTAL KNEE ARTHROPLASTY Right 12/17/2018   Procedure: TOTAL KNEE ARTHROPLASTY;  Surgeon: [name], MD;  Location: [facility];  Service: Orthopedics;  Laterality: Right;     Family History  Problem Relation Age of Onset   Pneumonia Brother     Social History   Socioeconomic History   Marital status: Divorced    Spouse name: Not on file   Number of children: 1   Years of education: Not on file   Highest education level: Not on file  Occupational History   Occupation: long distance driver    Comment: Retired   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, materials  Tobacco Use   Smoking status: Never    Passive exposure: Past   Smokeless tobacco: Never   Tobacco comments:    CIGARS ONLY--QUIT 2013  Vaping Use   Vaping status: Never Used  Substance and Sexual Activity   Alcohol use: Yes    Alcohol/week: 0.0 standard drinks of alcohol    Comment: rare//occassional   Drug use: No   Sexual activity: Yes  Other Topics Concern   Not on file  Social History Narrative   Has living will   Son is health care POA   Would accept resuscitation attempts   Likely wouldn't want extended tube feeds if cognitively unaware   Social Determinants of Health   Financial Resource Strain: Not on file  Food Insecurity: Not on file  Transportation Needs: Not on file  Physical Activity: Not on file  Stress: Not on file  Social Connections: Not on file  Intimate Partner Violence: Not on file   Review of Systems Eating okay--weight up slightly since last time      Objective:   Physical Exam Constitutional:      Appearance: Normal appearance.  Neurological:     Mental Status: He is alert.  Psychiatric:        Mood and Affect: Mood normal.  Behavior: Behavior normal.            Assessment & Plan:

## 2023-05-15 NOTE — Assessment & Plan Note (Signed)
Th chronic pain Uses the hydrocodone 4 daily Every 3 month epidural injections also help

## 2023-05-15 NOTE — Assessment & Plan Note (Signed)
PDMP reviewed No concerns Reviewed his alprazolam prescription

## 2023-05-18 LAB — DM TEMPLATE

## 2023-05-18 LAB — DRUG MONITORING, PANEL 8 WITH CONFIRMATION, URINE
6 Acetylmorphine: NEGATIVE ng/mL (ref <10)
Alcohol Metabolites: NEGATIVE ng/mL (ref <500)
Alphahydroxyalprazolam: 100 ng/mL — ABNORMAL HIGH (ref <25)
Alphahydroxymidazolam: NEGATIVE ng/mL (ref <50)
Alphahydroxytriazolam: NEGATIVE ng/mL (ref <50)
Aminoclonazepam: NEGATIVE ng/mL (ref <25)
Amphetamines: NEGATIVE ng/mL (ref <500)
Benzodiazepines: POSITIVE ng/mL — AB (ref <100)
Buprenorphine, Urine: NEGATIVE ng/mL (ref <5)
Cocaine Metabolite: NEGATIVE ng/mL (ref <150)
Codeine: NEGATIVE ng/mL (ref <50)
Creatinine: 213.5 mg/dL (ref >=20.0)
Hydrocodone: 1627 ng/mL — ABNORMAL HIGH (ref <50)
Hydromorphone: 279 ng/mL — ABNORMAL HIGH (ref <50)
Hydroxyethylflurazepam: NEGATIVE ng/mL (ref <50)
Lorazepam: NEGATIVE ng/mL (ref <50)
MDMA: NEGATIVE ng/mL (ref <500)
Marijuana Metabolite: NEGATIVE ng/mL (ref <20)
Morphine: NEGATIVE ng/mL (ref <50)
Nordiazepam: NEGATIVE ng/mL (ref <50)
Norhydrocodone: 1468 ng/mL — ABNORMAL HIGH (ref <50)
Opiates: POSITIVE ng/mL — AB (ref <100)
Oxazepam: NEGATIVE ng/mL (ref <50)
Oxidant: NEGATIVE ug/mL (ref <200)
Oxycodone: NEGATIVE ng/mL (ref <100)
Temazepam: NEGATIVE ng/mL (ref <50)
pH: 5 (ref 4.5–9.0)

## 2023-05-29 ENCOUNTER — Telehealth: Payer: Self-pay | Admitting: Internal Medicine

## 2023-05-29 ENCOUNTER — Other Ambulatory Visit: Payer: Self-pay | Admitting: Internal Medicine

## 2023-05-29 ENCOUNTER — Telehealth: Payer: Self-pay | Admitting: *Deleted

## 2023-05-29 MED ORDER — APIXABAN 5 MG PO TABS: 5.0000 mg | ORAL_TABLET | Freq: Two times a day (BID) | ORAL | 11 refills | Status: DC

## 2023-05-29 NOTE — Telephone Encounter (Signed)
Copied from CRM [id number]. Topic: Clinical - Lab/Test Results >> May 29, 2023 10:44 AM Lennart Pall wrote: Reason for CRM: ***

## 2023-05-29 NOTE — Telephone Encounter (Signed)
Copied from CRM [id number]. Topic: Clinical - Medication Refill >> May 29, 2023  8:10 AM Steele Sizer wrote: Most Recent Primary Care Visit:  Provider: Tillman Abide I  Department: [facility]  Visit Type: OFFICE VISIT  Date: 05/15/2023  Medication: ELIQUIS 5 MG TABS tablet  Has the patient contacted their pharmacy? No (Agent: If no, request that the patient contact the pharmacy for the refill. If patient does not wish to contact the pharmacy document the reason why and proceed with request.) (Agent: If yes, when and what did the pharmacy advise?)  Is this the correct pharmacy for this prescription? Yes If no, delete pharmacy and type the correct one.  This is the patient's preferred pharmacy:  [facility] - [place], [place] - [address] [place] [address] Phone: [phone] Fax: [phone]    Has the prescription been filled recently? Yes  Is the patient out of the medication? Yes  Has the patient been seen for an appointment in the last year OR does the patient have an upcoming appointment? Yes  Can we respond through MyChart? No  Agent: Please be advised that Rx refills may take up to 3 business days. We ask that you follow-up with your pharmacy.

## 2023-05-29 NOTE — Telephone Encounter (Signed)
There is already a message about this. Asked about the pharmacy he needed it to go to. See that message for further notes.

## 2023-05-29 NOTE — Addendum Note (Signed)
Addended by: Eual Fines on: 05/29/2023 12:33 PM   Modules accepted: Orders

## 2023-05-29 NOTE — Telephone Encounter (Signed)
Copied from CRM [id number]. Topic: Clinical - Prescription Issue >> May 29, 2023 11:53 AM Samuel Jester B wrote: Reason for CRM: Pt stated that he tried to pick up his prescription Eliquis and they stated that they no longer carry that medication. Pt is needing the medication to be sent to the [facility] located on [address], [place] [address]

## 2023-05-29 NOTE — Telephone Encounter (Signed)
Prescription Request  05/29/2023  LOV: 05/15/2023  What is the name of the medication or equipment? ELIQUIS 5 MG TABS tablet   Have you contacted your pharmacy to request a refill? Yes   Which pharmacy would you like this sent to?  [facility] - [place], [place] - [address] [place] [address] Phone: [phone] Fax: [phone]  [id number] [place], [place] - [address] AT [facility] [address] [place] [address] Phone: [phone] Fax: [phone]    Patient notified that their request is being sent to the clinical staff for review and that they should receive a response within 2 business days.   Please advise at Mobile [phone] (mobile)

## 2023-05-29 NOTE — Telephone Encounter (Signed)
[facility] [place] as requested.

## 2023-05-29 NOTE — Telephone Encounter (Signed)
Duplicate request

## 2023-06-08 ENCOUNTER — Other Ambulatory Visit: Payer: Self-pay | Admitting: Internal Medicine

## 2023-06-08 NOTE — Telephone Encounter (Signed)
 Last filled 01-02-23 #90 Last OV 05-15-23 Next OV 08-07-23 [facility]

## 2023-06-08 NOTE — Telephone Encounter (Signed)
 Spoke with pt and advised him that we are still waiting for Dr. Alphonsus Sias to approve this refill. He verbalized understanding.

## 2023-06-08 NOTE — Telephone Encounter (Signed)
 Name of Medication: Hydrocodone  apap 5-325 mg Name of Pharmacy: [facility] Last Fill or Written Date and Quantity: 05-10-23 # 120 Last Office Visit and Type: 05-15-23 Next Office Visit and Type: 08-07-23 Last Controlled Substance Agreement Date: 05-15-23 Last UDS: 05-15-23

## 2023-06-08 NOTE — Telephone Encounter (Signed)
 Copied from CRM [id number]. Topic: Clinical - Prescription Issue >> Jun 08, 2023  2:32 PM Joanell B wrote: Reason for CRM: Pt is requesting a callback regarding medication ALPRAZolam  (XANAX ) 0.5, he stated that the pharmacy informed him that they did not get the order for this medication however they did receive the HYDROcodone -acetaminophen  (NORCO/VICODIN) 5-325 MG.

## 2023-06-09 ENCOUNTER — Other Ambulatory Visit: Payer: Self-pay | Admitting: Internal Medicine

## 2023-06-09 ENCOUNTER — Telehealth: Payer: Self-pay

## 2023-06-09 MED ORDER — ALPRAZOLAM 0.5 MG PO TABS: ORAL_TABLET | ORAL | 0 refills | Status: DC

## 2023-06-09 NOTE — Telephone Encounter (Signed)
 Rx sent

## 2023-06-09 NOTE — Telephone Encounter (Unsigned)
 Copied from CRM [id number]. Topic: Clinical - Medication Refill >> Jun 09, 2023 11:17 AM Rolin D wrote: Most Recent Primary Care Visit:  Provider: JIMMY ADE I  Department: [facility]  Visit Type: OFFICE VISIT  Date: 05/15/2023  Medication: ALPRAZolam  (XANAX ) 0.5 MG tablet  Has the patient contacted their pharmacy? Yes (Agent: If no, request that the patient contact the pharmacy for the refill. If patient does not wish to contact the pharmacy document the reason why and proceed with request.) (Agent: If yes, when and what did the pharmacy advise?)  Is this the correct pharmacy for this prescription? Yes If no, delete pharmacy and type the correct one.  This is the patient's preferred pharmacy:    [id number] - [place], [place] - [address] AT [facility] [address] [place] [address] Phone: [phone] Fax: [phone]   Has the prescription been filled recently? No  Is the patient out of the medication? Yes  Has the patient been seen for an appointment in the last year OR does the patient have an upcoming appointment? Yes  Can we respond through MyChart? Yes  Agent: Please be advised that Rx refills may take up to 3 business days. We ask that you follow-up with your pharmacy.

## 2023-06-09 NOTE — Telephone Encounter (Signed)
 Refill request for   ALPRAZolam (XANAX) 0.5 MG tablet      LOV - 05/15/23 Next OV - 08/07/23 Last refill - 01/02/23 #90/0

## 2023-06-09 NOTE — Telephone Encounter (Signed)
 Please advise, see below.  Patient requesting rx refill for ALPRAZolam (XANAX) 0.5 MG tablet.   LOV: 05/15/2023 NOV: 08/07/2023 Last rx refill: 01/02/2023

## 2023-06-09 NOTE — Addendum Note (Signed)
 Addended by: Tillman Abide I on: 06/09/2023 02:49 PM   Modules accepted: Orders

## 2023-06-09 NOTE — Telephone Encounter (Signed)
 Copied from CRM [id number]. Topic: Clinical - Medication Refill >> Jun 09, 2023 11:17 AM Rolin D wrote: Most Recent Primary Care Visit:  Provider: JIMMY ADE I  Department: [facility]  Visit Type: OFFICE VISIT  Date: 05/15/2023  Medication: ALPRAZolam  (XANAX ) 0.5 MG tablet  Has the patient contacted their pharmacy? Yes (Agent: If no, request that the patient contact the pharmacy for the refill. If patient does not wish to contact the pharmacy document the reason why and proceed with request.) (Agent: If yes, when and what did the pharmacy advise?)  Is this the correct pharmacy for this prescription? Yes If no, delete pharmacy and type the correct one.  This is the patient's preferred pharmacy:    [id number] - [place], [place] - [address] AT [facility] [address] [place] [address] Phone: [phone] Fax: [phone]   Has the prescription been filled recently? No  Is the patient out of the medication? Yes  Has the patient been seen for an appointment in the last year OR does the patient have an upcoming appointment? Yes  Can we respond through MyChart? Yes  Agent: Please be advised that Rx refills may take up to 3 business days. We ask that you follow-up with your pharmacy.

## 2023-06-15 DIAGNOSIS — G4733 Obstructive sleep apnea (adult) (pediatric): Secondary | ICD-10-CM | POA: Diagnosis not present

## 2023-06-19 DIAGNOSIS — K080 Exfoliation of teeth due to systemic causes: Secondary | ICD-10-CM | POA: Diagnosis not present

## 2023-06-21 DIAGNOSIS — M79641 Pain in right hand: Secondary | ICD-10-CM | POA: Diagnosis not present

## 2023-06-21 DIAGNOSIS — G5601 Carpal tunnel syndrome, right upper limb: Secondary | ICD-10-CM | POA: Diagnosis not present

## 2023-06-26 DIAGNOSIS — M205X2 Other deformities of toe(s) (acquired), left foot: Secondary | ICD-10-CM | POA: Diagnosis not present

## 2023-06-26 DIAGNOSIS — M79671 Pain in right foot: Secondary | ICD-10-CM | POA: Diagnosis not present

## 2023-06-26 DIAGNOSIS — M205X1 Other deformities of toe(s) (acquired), right foot: Secondary | ICD-10-CM | POA: Diagnosis not present

## 2023-06-26 DIAGNOSIS — M109 Gout, unspecified: Secondary | ICD-10-CM | POA: Diagnosis not present

## 2023-07-03 DIAGNOSIS — G8929 Other chronic pain: Secondary | ICD-10-CM | POA: Diagnosis not present

## 2023-07-03 DIAGNOSIS — Z79899 Other long term (current) drug therapy: Secondary | ICD-10-CM | POA: Diagnosis not present

## 2023-07-03 DIAGNOSIS — M545 Low back pain, unspecified: Secondary | ICD-10-CM | POA: Diagnosis not present

## 2023-07-10 ENCOUNTER — Other Ambulatory Visit: Payer: Self-pay | Admitting: Internal Medicine

## 2023-07-10 DIAGNOSIS — K080 Exfoliation of teeth due to systemic causes: Secondary | ICD-10-CM | POA: Diagnosis not present

## 2023-07-16 DIAGNOSIS — G4733 Obstructive sleep apnea (adult) (pediatric): Secondary | ICD-10-CM | POA: Diagnosis not present

## 2023-07-24 DIAGNOSIS — G5601 Carpal tunnel syndrome, right upper limb: Secondary | ICD-10-CM | POA: Diagnosis not present

## 2023-08-07 ENCOUNTER — Ambulatory Visit: Payer: Medicare Other | Admitting: Internal Medicine

## 2023-08-07 ENCOUNTER — Encounter: Payer: Self-pay | Admitting: Internal Medicine

## 2023-08-07 VITALS — BP 110/60 | HR 74 | Temp 98.8°F | Ht 72.0 in | Wt 265.0 lb

## 2023-08-07 DIAGNOSIS — M48062 Spinal stenosis, lumbar region with neurogenic claudication: Secondary | ICD-10-CM | POA: Diagnosis not present

## 2023-08-07 DIAGNOSIS — B079 Viral wart, unspecified: Secondary | ICD-10-CM | POA: Diagnosis not present

## 2023-08-07 DIAGNOSIS — F1120 Opioid dependence, uncomplicated: Secondary | ICD-10-CM | POA: Diagnosis not present

## 2023-08-07 DIAGNOSIS — M25641 Stiffness of right hand, not elsewhere classified: Secondary | ICD-10-CM | POA: Diagnosis not present

## 2023-08-07 MED ORDER — HYDROCODONE-ACETAMINOPHEN 5-325 MG PO TABS: 1.0000 | ORAL_TABLET | Freq: Four times a day (QID) | ORAL | 0 refills | Status: DC | PRN

## 2023-08-07 MED ORDER — ALPRAZOLAM 0.5 MG PO TABS: 0.5000 mg | ORAL_TABLET | Freq: Three times a day (TID) | ORAL | 0 refills | Status: DC | PRN

## 2023-08-07 MED ORDER — TRIAMCINOLONE ACETONIDE 0.1 % EX CREA: 1.0000 | TOPICAL_CREAM | Freq: Two times a day (BID) | CUTANEOUS | 1 refills | Status: DC | PRN

## 2023-08-07 NOTE — Assessment & Plan Note (Signed)
 Discussed options ---he gave verbal consent for cryotherapy  Pared with scalpel Liquid nitrogen 30 seconds x 2--tolerated well Discussed home Rx

## 2023-08-07 NOTE — Assessment & Plan Note (Signed)
 Ongoing pain Able to stay active with ESI every 12 weeks and hydrocodone 5/325 four daily

## 2023-08-07 NOTE — Progress Notes (Signed)
 Subjective:    Patient ID: Dakota Ellison, male    DOB: 1951-02-08, 73 y.o.   MRN: [id number]  HPI Here for follow up of chronic pain and narcotic dependence Also concerned about a spot on his hand  Back pain is about the same Still getting steroid injections about every 3 months Same hydrocodone--uses it 4 times a day. 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     ALPRAZolam (XANAX) 0.5 MG tablet TAKE 1 TABLET BY MOUTH 3 TIMES DAILY AS NEEDED 90 tablet 0   apixaban (ELIQUIS) 5 MG TABS tablet Take 1 tablet (5 mg total) by mouth 2 (two) times daily. 60 tablet 11   cetirizine (ZYRTEC) 10 MG tablet Take 10 mg by mouth daily.     Cholecalciferol (VITAMIN D) 50 MCG (2000 UT) CAPS Take 1 capsule by mouth daily.     ezetimibe (ZETIA) 10 MG tablet TAKE 1 TABLET BY MOUTH DAILY 90 tablet 3   Garlic 2000 MG CAPS Take 2,000 mg by mouth 2 (two) times daily.      Ginkgo Biloba 120 MG CAPS Take 120 mg by mouth daily.     HYDROcodone-acetaminophen (NORCO/VICODIN) 5-325 MG tablet Take 1 tablet by mouth every 6 (six) hours as needed for moderate pain (pain score 4-6). 120 tablet 0   metoprolol succinate (TOPROL-XL) 50 MG 24 hr tablet Take 1 tablet (50 mg total) by mouth daily. 90 tablet 3   Multiple Vitamin (MULTIVITAMIN WITH MINERALS) TABS tablet Take 1 tablet by mouth daily.     niacin 500 MG CR capsule Take 500 mg by mouth 2 (two) times daily.     Omega-3 Fatty Acids (FISH OIL) 1200 MG CPDR Take 1,200 mg by mouth daily.     omeprazole (PRILOSEC) 20 MG capsule TAKE 1 CAPSULE BY MOUTH DAILY 90 capsule 3   sertraline (ZOLOFT) 25 MG tablet TAKE 1 TABLET BY MOUTH DAILY 90 tablet 3   sildenafil  (VIAGRA) 100 MG tablet Take as directed (Patient taking differently: Take 100 mg by mouth daily as needed for erectile dysfunction. Take as directed) 10 tablet 5   SYSTANE ULTRA 0.4-0.3 % SOLN Place 1 drop into both eyes 4 (four) times daily as needed (dry/irritated eyes.).     testosterone cypionate (DEPOTESTOSTERONE CYPIONATE) 200 MG/ML injection Inject 200 mg into the muscle every 14 (fourteen) days. Saturdays     triamcinolone (KENALOG) 0.1 % Apply 1 application topically 2 (two) times daily as needed. 45 g 1   Turmeric (QC TUMERIC COMPLEX PO) Take 500 mg by mouth daily.     valsartan-hydrochlorothiazide (DIOVAN-HCT) 320-25 MG tablet TAKE 1 TABLET BY MOUTH DAILY 90 tablet 3   vitamin C (ASCORBIC ACID) 500 MG tablet Take 500 mg by mouth daily.     zinc gluconate 50 MG tablet Take 50 mg by mouth daily.     No current facility-administered medications on file prior to visit.    Allergies  Allergen Reactions   Atorvastatin Other (See Comments)    Muscle weakness in his legs   Crestor [Rosuvastatin] Other (See Comments)    Severe muscle cramping   Zocor [Simvastatin] Other (See Comments)  Muscle weakness in his legs   Atorvastatin Calcium Other (See Comments)    Past Medical History:  Diagnosis Date   Anxiety state 04/08/2014   denies   Complication of anesthesia    and trouble waking up   Degenerative disc disease    Dysthymia    denies   ED (erectile dysfunction)    GERD (gastroesophageal reflux disease)    History of MRSA infection    on lip from cut   Hyperlipemia    Hypertension    take BP med for small leak in heart to keep it small per pt.   Mild asthma    as a teen   Morbid obesity (HCC)    OSA (obstructive sleep apnea)    Osteoarthritis    PONV (postoperative nausea and vomiting)    Rectal fissure    Testosterone deficiency     Past Surgical History:  Procedure Laterality Date   HERNIA REPAIR     1998   KNEE SURGERY  09/2010   left knee and right knee  arthroscopy   OTHER SURGICAL HISTORY     arthroscopic subacromial decompression   OTHER SURGICAL HISTORY     open resection, distal right clavicle   SHOULDER ARTHROSCOPY     right   SPHINCTEROTOMY     TOTAL KNEE ARTHROPLASTY Left 06/11/2018   Procedure: LEFT TOTAL KNEE ARTHROPLASTY;  Surgeon: [name], MD;  Location: [facility];  Service: Orthopedics;  Laterality: Left;    TOTAL KNEE ARTHROPLASTY Right 12/17/2018   Procedure: TOTAL KNEE ARTHROPLASTY;  Surgeon: [name], MD;  Location: [facility];  Service: Orthopedics;  Laterality: Right;     Family History  Problem Relation Age of Onset   Pneumonia Brother     Social History   Socioeconomic History   Marital status: Divorced    Spouse name: Not on file   Number of children: 1   Years of education: Not on file   Highest education level: Not on file  Occupational History   Occupation: long distance driver    Comment: Retired   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, materials  Tobacco Use   Smoking status: Never    Passive exposure: Past   Smokeless tobacco: Never   Tobacco comments:    CIGARS ONLY--QUIT 2013  Vaping Use   Vaping status: Never Used  Substance and Sexual Activity   Alcohol use: Yes    Alcohol/week: 0.0 standard drinks of alcohol    Comment: rare//occassional   Drug use: No   Sexual activity: Yes  Other Topics Concern   Not on file  Social History Narrative   Has living will   Son is health care POA   Would accept resuscitation attempts   Likely wouldn't want extended tube feeds if cognitively unaware   Social Drivers of Health   Financial Resource Strain: Not on file  Food Insecurity: Not on file  Transportation Needs: Not on file  Physical Activity: Not on file  Stress: Not on file  Social Connections: Not on file  Intimate Partner Violence: Not on file   Review of Systems Sleeps okay--uses the CPAP Appetite is okay    Objective:   Physical  Exam Skin:    Comments: 6mm wart between left 3rd and 4th fingers  Psychiatric:        Mood and Affect: Mood normal.        Behavior: Behavior normal.  Assessment & Plan:

## 2023-08-07 NOTE — Assessment & Plan Note (Signed)
 PDMP reviewed No concerns

## 2023-08-13 DIAGNOSIS — G4733 Obstructive sleep apnea (adult) (pediatric): Secondary | ICD-10-CM | POA: Diagnosis not present

## 2023-09-06 ENCOUNTER — Other Ambulatory Visit: Payer: Self-pay | Admitting: Internal Medicine

## 2023-09-06 NOTE — Telephone Encounter (Addendum)
 Name of Medication: Hydrocodone apap 5-325 mg Name of Pharmacy: [facility] Last Fill or Written Date and Quantity: 08-07-23 # 120 Last Office Visit and Type: 05-15-23 Next Office Visit and Type: 08-07-23 Last Controlled Substance Agreement Date: 05-15-23 Last UDS: 05-15-23  Alprazolam last filled 08-07-23 #90

## 2023-10-02 DIAGNOSIS — M545 Low back pain, unspecified: Secondary | ICD-10-CM | POA: Diagnosis not present

## 2023-10-04 ENCOUNTER — Other Ambulatory Visit: Payer: Self-pay | Admitting: Internal Medicine

## 2023-10-05 NOTE — Telephone Encounter (Signed)
 Name of Medication: Hydrocodone  apap 5-325 mg Name of Pharmacy: [facility] Last Fill or Written Date and Quantity: 09-06-23 # 120 Last Office Visit and Type: 05-15-23 Next Office Visit and Type: 08-07-23 Last Controlled Substance Agreement Date: 05-15-23 Last UDS: 05-15-23   Alprazolam  last filled 09-06-23 #90

## 2023-10-07 NOTE — Progress Notes (Unsigned)
 Subjective:    Patient ID: LUAL Dakota Ellison, male    DOB: Mar 27, 1951, 73 y.o.   MRN: [id number]  HPI  male Never smoker followed for OSA complicated by morbid obesity, HBP, asthma, GERD NPSG 2010, AHI 82/ hr CPAP to 20  ------------------------------------------------------------------------------------  10/10/22- 73 year old male never smoker followed for OSA, complicated by morbid Obesity, HTN, AFib, Asthma, GERD, ( works transporting inmates), Low Back Pain, Hyperlipidemia,  -alprazolam  0.5 tid prn,  CPAP auto 10-20/ [employer]     Resmed AirSense 10 AutoSet  Download-compliance 100%, AHI 3.6/ hr Body weight today- 286 lbs Download reviewed.  Sleeping well with CPAP with no direct concerns. Atrial fibrillation last year, managed with medication, converted spontaneously.  Followed by cardiology. CXR 01/19/22- IMPRESSION: No acute cardiopulmonary process.  10/09/23-73 year old male never smoker followed for OSA, complicated by morbid Obesity, HTN, AFib, Asthma, GERD, ( works transporting inmates), Low Back Pain, Hyperlipidemia,  -alprazolam  0.5 tid prn,  CPAP auto 10-20/ [employer]     Resmed AirSense 10 AutoSet  Download-compliance 972%, AHI 15.5/hr   (Pressure ranging 18.9-19.9) Body weight today- 264 lbs Discussed the use of AI scribe software for clinical note transcription with the patient, who gave verbal consent to proceed. History of Present Illness   Dakota Ellison is a 73 year old male with obstructive sleep apnea who presents for evaluation of his CPAP therapy and weight loss.  He has lost approximately thirty pounds over the past year through dietary changes, including eliminating soft drinks and bread. His current CPAP machine, set between ten and twenty centimeters of pressure, is primarily operating at the higher end, around nineteen to twenty centimeters, with frequent breakthrough apneas. Control was better last year, and he has used this machine since 2020. He recently acquired a new  CPAP mask but has not used it yet. Significant air leakage with his current mask may be affecting treatment efficacy. He is interested in [place] since he has lost weight. We will refer him to explore [facility] If he decides not to go that way, we will order a BIPAP titration study to get better apnea control.      Assessment and Plan:    Obstructive Sleep Apnea Suboptimal control on current CPAP settings. CPAP consistently at upper limit, indicating inadequate control. Possible decreased efficacy of machine since 2020. 30-pound weight loss may influence treatment. Interest in [place] device. - Refer to ENT for Inspire device evaluation. - Advise CPAP mask replacement to address leakage. - Discuss potential in-center sleep study for BiPAP evaluation if Inspire not pursued.         ROS-see HPI   + = positive Constitutional:    weight loss, night sweats, fevers, chills, fatigue, lassitude. HEENT:    headaches, difficulty swallowing, tooth/dental problems, sore throat,       sneezing, itching, ear ache, nasal congestion, post nasal drip, snoring CV:    chest pain, orthopnea, PND, swelling in lower extremities, anasarca,                                   dizziness, palpitations Resp:   shortness of breath with exertion or at rest.                productive cough,   non-productive cough, coughing up of blood.              change in color of mucus.  wheezing.   Skin:  rash or lesions. GI:  No-   heartburn, indigestion, abdominal pain, nausea, vomiting, diarrhea,                 change in bowel habits, loss of appetite GU: dysuria, change in color of urine, no urgency or frequency.   flank pain. MS:   joint pain, stiffness, decreased range of motion, back pain. Neuro-     nothing unusual Psych:  change in mood or affect.  depression or anxiety.   memory loss.    Objective:   OBJ- Physical Exam       General- Alert, Oriented, Affect-appropriate, Distress- none acute, + obese/big man Skin-  rash-none, lesions- none, excoriation- none Lymphadenopathy- none Head- atraumatic            Eyes- Gross vision intact, PERRLA, conjunctivae and secretions clear            Ears- Hearing, canals-normal            Nose- Clear, no-Septal dev, mucus, polyps, erosion, perforation             Throat- Mallampati III , mucosa clear , drainage- none, tonsils- atrophic Neck- flexible , trachea midline, no stridor , thyroid  nl, carotid no bruit Chest - symmetrical excursion , unlabored           Heart/CV- RRR/ occ skip , no murmur , no gallop  , no rub, nl s1 s2                           - JVD- none , edema- none, stasis changes- none, varices- none           Lung- clear to P&A, wheeze- none, cough- none , dullness-none, rub- none           Chest wall-  Abd-  Br/ Gen/ Rectal- Not done, not indicated Extrem- + boot right foot Neuro- grossly intact to observation    Assessment & Plan:

## 2023-10-09 ENCOUNTER — Ambulatory Visit: Payer: Medicare Other | Admitting: Internal Medicine

## 2023-10-09 ENCOUNTER — Encounter: Payer: Self-pay | Admitting: Internal Medicine

## 2023-10-09 VITALS — BP 130/72 | HR 75 | Temp 98.2°F | Resp 18 | Ht 72.0 in | Wt 264.0 lb

## 2023-10-09 DIAGNOSIS — G4733 Obstructive sleep apnea (adult) (pediatric): Secondary | ICD-10-CM

## 2023-10-09 DIAGNOSIS — Z9981 Dependence on supplemental oxygen: Secondary | ICD-10-CM

## 2023-10-09 NOTE — Patient Instructions (Signed)
 Order- referral to [facility] ENT Dr Larkin Plumb   consider  Dakota Ellison  If you don't go forward with Inspire, please let me know and we will schedule at BIPAP titration sleep study to get better control of your apneas.  Good for you on getting your weight down- that helps us .

## 2023-10-12 ENCOUNTER — Encounter (INDEPENDENT_AMBULATORY_CARE_PROVIDER_SITE_OTHER): Payer: Self-pay | Admitting: Otolaryngology

## 2023-10-23 DIAGNOSIS — K080 Exfoliation of teeth due to systemic causes: Secondary | ICD-10-CM | POA: Diagnosis not present

## 2023-10-25 DIAGNOSIS — G5601 Carpal tunnel syndrome, right upper limb: Secondary | ICD-10-CM | POA: Diagnosis not present

## 2023-10-27 ENCOUNTER — Other Ambulatory Visit: Payer: Self-pay | Admitting: Internal Medicine

## 2023-10-29 DIAGNOSIS — M533 Sacrococcygeal disorders, not elsewhere classified: Secondary | ICD-10-CM | POA: Diagnosis not present

## 2023-11-03 ENCOUNTER — Encounter: Payer: Self-pay | Admitting: Internal Medicine

## 2023-11-06 ENCOUNTER — Ambulatory Visit: Admitting: Internal Medicine

## 2023-11-06 ENCOUNTER — Other Ambulatory Visit: Payer: Self-pay | Admitting: Internal Medicine

## 2023-11-06 NOTE — Telephone Encounter (Signed)
 Name of Medication: Hydrocodone  apap 5-325 mg Name of Pharmacy: [facility] Last Fill or Written Date and Quantity: 10-05-23 # 120 Last Office Visit and Type: 05-15-23 Next Office Visit and Type: 11-20-23 Last Controlled Substance Agreement Date: 05-15-23 Last UDS: 05-15-23   Alprazolam  last filled 10-05-23 #90

## 2023-11-13 DIAGNOSIS — M533 Sacrococcygeal disorders, not elsewhere classified: Secondary | ICD-10-CM | POA: Diagnosis not present

## 2023-11-15 ENCOUNTER — Telehealth: Admitting: Physician Assistant

## 2023-11-15 ENCOUNTER — Ambulatory Visit: Payer: Self-pay

## 2023-11-15 DIAGNOSIS — J208 Acute bronchitis due to other specified organisms: Secondary | ICD-10-CM

## 2023-11-15 MED ORDER — BENZONATATE 100 MG PO CAPS: 100.0000 mg | ORAL_CAPSULE | Freq: Three times a day (TID) | ORAL | 0 refills | Status: DC | PRN

## 2023-11-15 MED ORDER — PREDNISONE 20 MG PO TABS: 40.0000 mg | ORAL_TABLET | Freq: Every day | ORAL | 0 refills | Status: DC

## 2023-11-15 MED ORDER — PROMETHAZINE-DM 6.25-15 MG/5ML PO SYRP: 5.0000 mL | ORAL_SOLUTION | Freq: Three times a day (TID) | ORAL | 0 refills | Status: DC | PRN

## 2023-11-15 NOTE — Progress Notes (Signed)
 Virtual Visit Consent   Abiel [facility], you are scheduled for a virtual visit with a [facility] provider today. Just as with appointments in the office, your consent must be obtained to participate. Your consent will be active for this visit and any virtual visit you may have with one of our providers in the next 365 days. If you have a MyChart account, a copy of this consent can be sent to you electronically.  As this is a virtual visit, video technology does not allow for your provider to perform a traditional examination. This may limit your provider's ability to fully assess your condition. If your provider identifies any concerns that need to be evaluated in person or the need to arrange testing (such as labs, EKG, etc.), we will make arrangements to do so. Although advances in technology are sophisticated, we cannot ensure that it will always work on either your end or our end. If the connection with a video visit is poor, the visit may have to be switched to a telephone visit. With either a video or telephone visit, we are not always able to ensure that we have a secure connection.  By engaging in this virtual visit, you consent to the provision of healthcare and authorize for your insurance to be billed (if applicable) for the services provided during this visit. Depending on your insurance coverage, you may receive a charge related to this service.  I need to obtain your verbal consent now. Are you willing to proceed with your visit today? Dakota Ellison has provided verbal consent on 11/15/2023 for a virtual visit (video or telephone). Angelia Kelp, PA-C  Date: 11/15/2023 9:01 AM   Virtual Visit via Video Note   I, Angelia Kelp, connected with  Dakota Ellison  ([id number], [birth year]) on 11/15/23 at  8:45 AM EDT by a video-enabled telemedicine application and verified that I am speaking with the correct person using two identifiers.  Location: Patient: Virtual Visit Location  Patient: Home Provider: Virtual Visit Location Provider: Home Office   I discussed the limitations of evaluation and management by telemedicine and the availability of in person appointments. The patient expressed understanding and agreed to proceed.    History of Present Illness: Dakota Ellison is a 73 y.o. who identifies as a male who was assigned male at birth, and is being seen today for cough and congestion.  HPI: Cough This is a new problem. The current episode started in the past 7 days. The problem has been gradually worsening. The cough is Productive of sputum and productive of purulent sputum. Associated symptoms include chest pain (with cough only), a fever (low grade 100.4), myalgias (from coughing), nasal congestion, postnasal drip, rhinorrhea, a sore throat (only from coughing) and wheezing (mild yesterday). Pertinent negatives include no chills, ear congestion, ear pain, headaches or shortness of breath. The symptoms are aggravated by lying down and pollens. Treatments tried: cough drops. The treatment provided no relief. His past medical history is significant for bronchitis.     Problems:  Patient Active Problem List   Diagnosis Date Noted   Wart of hand 08/07/2023   Foot ulceration, right, with fat layer exposed (HCC) 09/23/2022   Atrial fibrillation (HCC) 01/24/2022   Cyst of left kidney 12/16/2019   Advance directive discussed with patient 03/27/2019   Osteoarthritis of right knee 12/17/2018   Chronic narcotic dependence (HCC) 10/09/2018   Preventative health care 10/07/2014   Mood disorder (HCC) 10/07/2014   Impaired glucose  tolerance 10/04/2013   Inguinal hernia unilateral, non-recurrent, left 10/17/2012   Bladder neck obstruction 10/04/2010   Obstructive sleep apnea on CPAP 10/06/2008   Testosterone  deficiency 10/02/2007   Hyperlipidemia 07/13/2007   Morbid obesity (HCC) 07/13/2007   ERECTILE DYSFUNCTION 07/13/2007   Essential hypertension 07/13/2007   Lumbar  spinal stenosis 07/13/2007   Asthma 07/12/2007   GERD 07/12/2007   OA (osteoarthritis) of knee 07/12/2007    Allergies:  Allergies  Allergen Reactions   Atorvastatin  Other (See Comments)    Muscle weakness in his legs   Crestor  [Rosuvastatin ] Other (See Comments)    Severe muscle cramping   Zocor [Simvastatin] Other (See Comments)    Muscle weakness in his legs   Atorvastatin  Calcium  Other (See Comments)   Medications:  Current Outpatient Medications:    benzonatate  (TESSALON ) 100 MG capsule, Take 1 capsule (100 mg total) by mouth 3 (three) times daily as needed., Disp: 30 capsule, Rfl: 0   predniSONE  (DELTASONE ) 20 MG tablet, Take 2 tablets (40 mg total) by mouth daily with breakfast., Disp: 14 tablet, Rfl: 0   promethazine -dextromethorphan (PROMETHAZINE -DM) 6.25-15 MG/5ML syrup, Take 5 mLs by mouth 3 (three) times daily as needed., Disp: 118 mL, Rfl: 0   allopurinol (ZYLOPRIM) 100 MG tablet, TAKE 1 TABLET BY MOUTH TWICE DAILY, Disp: 60 tablet, Rfl: 11   ALPRAZolam  (XANAX ) 0.5 MG tablet, TAKE 1 TABLET BY MOUTH 3 TIMES DAILY AS NEEDED, Disp: 90 tablet, Rfl: 0   apixaban  (ELIQUIS ) 5 MG TABS tablet, Take 1 tablet (5 mg total) by mouth 2 (two) times daily., Disp: 60 tablet, Rfl: 11   cetirizine (ZYRTEC) 10 MG tablet, Take 10 mg by mouth daily., Disp: , Rfl:    Cholecalciferol (VITAMIN D ) 50 MCG (2000 UT) CAPS, Take 1 capsule by mouth daily., Disp: , Rfl:    ezetimibe  (ZETIA ) 10 MG tablet, TAKE 1 TABLET BY MOUTH DAILY, Disp: 90 tablet, Rfl: 3   Garlic 2000 MG CAPS, Take 2,000 mg by mouth 2 (two) times daily. , Disp: , Rfl:    Ginkgo Biloba 120 MG CAPS, Take 120 mg by mouth daily., Disp: , Rfl:    HYDROcodone -acetaminophen  (NORCO/VICODIN) 5-325 MG tablet, Take 1 tablet by mouth every 6 (six) hours as needed for moderate pain (pain score 4-6)., Disp: 120 tablet, Rfl: 0   metoprolol  succinate (TOPROL -XL) 50 MG 24 hr tablet, Take 1 tablet (50 mg total) by mouth daily., Disp: 90 tablet, Rfl: 3    Multiple Vitamin (MULTIVITAMIN WITH MINERALS) TABS tablet, Take 1 tablet by mouth daily., Disp: , Rfl:    niacin 500 MG CR capsule, Take 500 mg by mouth 2 (two) times daily., Disp: , Rfl:    Omega-3 Fatty Acids (FISH OIL) 1200 MG CPDR, Take 1,200 mg by mouth daily., Disp: , Rfl:    omeprazole  (PRILOSEC) 20 MG capsule, TAKE 1 CAPSULE BY MOUTH DAILY, Disp: 90 capsule, Rfl: 3   sertraline  (ZOLOFT ) 25 MG tablet, TAKE 1 TABLET BY MOUTH DAILY, Disp: 90 tablet, Rfl: 3   sildenafil  (VIAGRA ) 100 MG tablet, Take as directed (Patient taking differently: Take 100 mg by mouth daily as needed for erectile dysfunction. Take as directed), Disp: 10 tablet, Rfl: 5   SYSTANE ULTRA 0.4-0.3 % SOLN, Place 1 drop into both eyes 4 (four) times daily as needed (dry/irritated eyes.)., Disp: , Rfl:    testosterone  cypionate (DEPOTESTOSTERONE CYPIONATE) 200 MG/ML injection, Inject 200 mg into the muscle every 14 (fourteen) days. Saturdays, Disp: , Rfl:    triamcinolone   cream (KENALOG ) 0.1 %, Apply 1 Application topically 2 (two) times daily as needed., Disp: 45 g, Rfl: 1   Turmeric (QC TUMERIC COMPLEX PO), Take 500 mg by mouth daily., Disp: , Rfl:    valsartan -hydrochlorothiazide  (DIOVAN -HCT) 320-25 MG tablet, TAKE 1 TABLET BY MOUTH DAILY, Disp: 90 tablet, Rfl: 3   vitamin C (ASCORBIC ACID) 500 MG tablet, Take 500 mg by mouth daily., Disp: , Rfl:    zinc gluconate 50 MG tablet, Take 50 mg by mouth daily., Disp: , Rfl:   Observations/Objective: Patient is well-developed, well-nourished in no acute distress.  Resting comfortably at home.  Head is normocephalic, atraumatic.  No labored breathing.  Speech is clear and coherent with logical content.  Patient is alert and oriented at baseline.    Assessment and Plan: 1. Viral bronchitis (Primary) - promethazine -dextromethorphan (PROMETHAZINE -DM) 6.25-15 MG/5ML syrup; Take 5 mLs by mouth 3 (three) times daily as needed.  Dispense: 118 mL; Refill: 0 - benzonatate  (TESSALON )  100 MG capsule; Take 1 capsule (100 mg total) by mouth 3 (three) times daily as needed.  Dispense: 30 capsule; Refill: 0 - predniSONE  (DELTASONE ) 20 MG tablet; Take 2 tablets (40 mg total) by mouth daily with breakfast.  Dispense: 14 tablet; Refill: 0   - Will treat with Prednisone , Promethazine  DM and tessalon  perles - Can continue Mucinex (PLAIN) - Push fluids.  - Rest.  - Steam and humidifier can help - Seek in person evaluation if worsening or symptoms fail to improve    Follow Up Instructions: I discussed the assessment and treatment plan with the patient. The patient was provided an opportunity to ask questions and all were answered. The patient agreed with the plan and demonstrated an understanding of the instructions.  A copy of instructions were sent to the patient via MyChart unless otherwise noted below.    The patient was advised to call back or seek an in-person evaluation if the symptoms worsen or if the condition fails to improve as anticipated.    Angelia Kelp, PA-C

## 2023-11-15 NOTE — Telephone Encounter (Signed)
 FYI Only or Action Required?: FYI only for provider  Patient was last seen in primary care on 08/07/2023 by Dakota Llanos, MD. Called Nurse Triage reporting Cough. Symptoms began Monday. Interventions attempted: OTC medications: cough drops. Symptoms are: gradually worsening.  Triage Disposition: See HCP Within 4 Hours (Or PCP Triage), See Physician Within 24 Hours  Patient/caregiver understands and will follow disposition?: yes  Copied from CRM [id number]. Topic: Clinical - Red Word Triage >> Nov 15, 2023  7:41 AM Dakota Ellison wrote: [name] that prompted transfer to Nurse Triage: Coughing up green mucus, has coughed so much ribs are hurting when he coughs, ribs, chest and insides feels like it's going to break when he coughs, hurts really bad temp 100.3 Reason for Disposition  SEVERE coughing spells (e.g., whooping sound after coughing, vomiting after coughing)  [1] Fever > 101 F (38.3 C) AND [2] age > 60 years  Answer Assessment - Initial Assessment Questions 1. ONSET: When did the cough begin?      Monday 2. SEVERITY: How bad is the cough today?      severe 3. SPUTUM: Describe the color of your sputum (none, dry cough; clear, white, yellow, green)     Green mucous 4. HEMOPTYSIS: Are you coughing up any blood? If so ask: How much? (flecks, streaks, tablespoons, etc.)     N/a 5. DIFFICULTY BREATHING: Are you having difficulty breathing? If Yes, ask: How bad is it? (e.g., mild, moderate, severe)    - MILD: No SOB at rest, mild SOB with walking, speaks normally in sentences, can lie down, no retractions, pulse < 100.    - MODERATE: SOB at rest, SOB with minimal exertion and prefers to sit, cannot lie down flat, speaks in phrases, mild retractions, audible wheezing, pulse 100-120.    - SEVERE: Very SOB at rest, speaks in single words, struggling to breathe, sitting hunched forward, retractions, pulse > 120      Mild - only with coughing 6. FEVER: Do you have a fever? If Yes,  ask: What is your temperature, how was it measured, and when did it start?     100.3 7. CARDIAC HISTORY: Do you have any history of heart disease? (e.g., heart attack, congestive heart failure)      N/a 8. LUNG HISTORY: Do you have any history of lung disease?  (e.g., pulmonary embolus, asthma, emphysema)     N/a 9. PE RISK FACTORS: Do you have a history of blood clots? (or: recent major surgery, recent prolonged travel, bedridden)     N/a 10. OTHER SYMPTOMS: Do you have any other symptoms? (e.g., runny nose, wheezing, chest pain)       Runny nose, fever 100.3, wheezing 11. PREGNANCY: Is there any chance you are pregnant? When was your last menstrual period?       N/a 12. TRAVEL: Have you traveled out of the country in the last month? (e.g., travel history, exposures)       N/a  Pt states his chest & ribs are extremely sore due to coughing: using cough drops.  Patient requested virtual appt & appt scheduled this morning.  Answer Assessment - Initial Assessment Questions 1. TEMPERATURE: What is the most recent temperature?  How was it measured?      100.3 2. ONSET: When did the fever start?      today 3. CHILLS: Do you have chills? If yes: How bad are they?  (e.g., none, mild, moderate, severe)   - NONE: no chills   -  MILD: feeling cold   - MODERATE: feeling very cold, some shivering (feels better under a thick blanket)   - SEVERE: feeling extremely cold with shaking chills (general body shaking, rigors; even under a thick blanket)      NONE 4. OTHER SYMPTOMS: Do you have any other symptoms besides the fever?  (e.g., abdomen pain, cough, diarrhea, earache, headache, sore throat, urination pain)     Cough congestion, runny nose 5. CAUSE: If there are no symptoms, ask: What do you think is causing the fever?      N/a 6. CONTACTS: Does anyone else in the family have an infection?     N/a 7. TREATMENT: What have you done so far to treat this fever?  (e.g., medications)     Cough drops only  8. IMMUNOCOMPROMISE: Do you have of the following: diabetes, HIV positive, splenectomy, cancer chemotherapy, chronic steroid treatment, transplant patient, etc.     N/a 9. PREGNANCY: Is there any chance you are pregnant? When was your last menstrual period?     N/a 10. TRAVEL: Have you traveled out of the country in the last month? (e.g., travel history, exposures)       N/a  Protocols used: Cough - Acute Productive-A-AH, Fever-A-AH

## 2023-11-15 NOTE — Patient Instructions (Signed)
 Landon Pinion, thank you for joining Angelia Kelp, PA-C for today's virtual visit.  While this provider is not your primary care provider (PCP), if your PCP is located in our provider database this encounter information will be shared with them immediately following your visit.   A [facility] MyChart account gives you access to today's visit and all your visits, tests, and labs performed at [facility]  click here if you don't have a [facility] MyChart account or go to [email or web address]  Consent: (Patient) Dakota Ellison [facility] provided verbal consent for this virtual visit at the beginning of the encounter.  Current Medications:  Current Outpatient Medications:    benzonatate  (TESSALON ) 100 MG capsule, Take 1 capsule (100 mg total) by mouth 3 (three) times daily as needed., Disp: 30 capsule, Rfl: 0   predniSONE  (DELTASONE ) 20 MG tablet, Take 2 tablets (40 mg total) by mouth daily with breakfast., Disp: 14 tablet, Rfl: 0   promethazine -dextromethorphan (PROMETHAZINE -DM) 6.25-15 MG/5ML syrup, Take 5 mLs by mouth 3 (three) times daily as needed., Disp: 118 mL, Rfl: 0   allopurinol (ZYLOPRIM) 100 MG tablet, TAKE 1 TABLET BY MOUTH TWICE DAILY, Disp: 60 tablet, Rfl: 11   ALPRAZolam  (XANAX ) 0.5 MG tablet, TAKE 1 TABLET BY MOUTH 3 TIMES DAILY AS NEEDED, Disp: 90 tablet, Rfl: 0   apixaban  (ELIQUIS ) 5 MG TABS tablet, Take 1 tablet (5 mg total) by mouth 2 (two) times daily., Disp: 60 tablet, Rfl: 11   cetirizine (ZYRTEC) 10 MG tablet, Take 10 mg by mouth daily., Disp: , Rfl:    Cholecalciferol (VITAMIN D ) 50 MCG (2000 UT) CAPS, Take 1 capsule by mouth daily., Disp: , Rfl:    ezetimibe  (ZETIA ) 10 MG tablet, TAKE 1 TABLET BY MOUTH DAILY, Disp: 90 tablet, Rfl: 3   Garlic 2000 MG CAPS, Take 2,000 mg by mouth 2 (two) times daily. , Disp: , Rfl:    Ginkgo Biloba 120 MG CAPS, Take 120 mg by mouth daily., Disp: , Rfl:    HYDROcodone -acetaminophen  (NORCO/VICODIN) 5-325 MG tablet, Take 1  tablet by mouth every 6 (six) hours as needed for moderate pain (pain score 4-6)., Disp: 120 tablet, Rfl: 0   metoprolol  succinate (TOPROL -XL) 50 MG 24 hr tablet, Take 1 tablet (50 mg total) by mouth daily., Disp: 90 tablet, Rfl: 3   Multiple Vitamin (MULTIVITAMIN WITH MINERALS) TABS tablet, Take 1 tablet by mouth daily., Disp: , Rfl:    niacin 500 MG CR capsule, Take 500 mg by mouth 2 (two) times daily., Disp: , Rfl:    Omega-3 Fatty Acids (FISH OIL) 1200 MG CPDR, Take 1,200 mg by mouth daily., Disp: , Rfl:    omeprazole  (PRILOSEC) 20 MG capsule, TAKE 1 CAPSULE BY MOUTH DAILY, Disp: 90 capsule, Rfl: 3   sertraline  (ZOLOFT ) 25 MG tablet, TAKE 1 TABLET BY MOUTH DAILY, Disp: 90 tablet, Rfl: 3   sildenafil  (VIAGRA ) 100 MG tablet, Take as directed (Patient taking differently: Take 100 mg by mouth daily as needed for erectile dysfunction. Take as directed), Disp: 10 tablet, Rfl: 5   SYSTANE ULTRA 0.4-0.3 % SOLN, Place 1 drop into both eyes 4 (four) times daily as needed (dry/irritated eyes.)., Disp: , Rfl:    testosterone  cypionate (DEPOTESTOSTERONE CYPIONATE) 200 MG/ML injection, Inject 200 mg into the muscle every 14 (fourteen) days. Saturdays, Disp: , Rfl:    triamcinolone  cream (KENALOG ) 0.1 %, Apply 1 Application topically 2 (two) times daily as needed., Disp: 45 g, Rfl: 1   Turmeric (QC  TUMERIC COMPLEX PO), Take 500 mg by mouth daily., Disp: , Rfl:    valsartan -hydrochlorothiazide  (DIOVAN -HCT) 320-25 MG tablet, TAKE 1 TABLET BY MOUTH DAILY, Disp: 90 tablet, Rfl: 3   vitamin C (ASCORBIC ACID) 500 MG tablet, Take 500 mg by mouth daily., Disp: , Rfl:    zinc gluconate 50 MG tablet, Take 50 mg by mouth daily., Disp: , Rfl:    Medications ordered in this encounter:  Meds ordered this encounter  Medications   promethazine -dextromethorphan (PROMETHAZINE -DM) 6.25-15 MG/5ML syrup    Sig: Take 5 mLs by mouth 3 (three) times daily as needed.    Dispense:  118 mL    Refill:  0    Supervising Provider:    Corine Dice [[id number]]   benzonatate  (TESSALON ) 100 MG capsule    Sig: Take 1 capsule (100 mg total) by mouth 3 (three) times daily as needed.    Dispense:  30 capsule    Refill:  0    Supervising Provider:   LAMPTEY, PHILIP O [[id number]]   predniSONE  (DELTASONE ) 20 MG tablet    Sig: Take 2 tablets (40 mg total) by mouth daily with breakfast.    Dispense:  14 tablet    Refill:  0    Supervising Provider:   Corine Dice [phone]     *If you need refills on other medications prior to your next appointment, please contact your pharmacy*  Follow-Up: Call back or seek an in-person evaluation if the symptoms worsen or if the condition fails to improve as anticipated.  [facility] [phone]  Other Instructions Acute Bronchitis, Adult  Acute bronchitis is sudden inflammation of the main airways (bronchi) that come off the windpipe (trachea) in the lungs. The swelling causes the airways to get smaller and make more mucus than normal. This can make it hard to breathe and can cause coughing or noisy breathing (wheezing). Acute bronchitis may last several weeks. The cough may last longer. Allergies, asthma, and exposure to smoke may make the condition worse. What are the causes? This condition can be caused by germs and by substances that irritate the lungs, including: Cold and flu viruses. The most common cause of this condition is the virus that causes the common cold. Bacteria. This is less common. Breathing in substances that irritate the lungs, including: Smoke from cigarettes and other forms of tobacco. Dust and pollen. Fumes from household cleaning products, gases, or burned fuel. Indoor or outdoor air pollution. What increases the risk? The following factors may make you more likely to develop this condition: A weak body's defense system, also called the immune system. A condition that affects your lungs and breathing, such as asthma. What are the signs or  symptoms? Common symptoms of this condition include: Coughing. This may bring up clear, yellow, or green mucus from your lungs (sputum). Wheezing. Runny or stuffy nose. Having too much mucus in your lungs (chest congestion). Shortness of breath. Aches and pains, including sore throat or chest. How is this diagnosed? This condition is usually diagnosed based on: Your symptoms and medical history. A physical exam. You may also have other tests, including tests to rule out other conditions, such as pneumonia. These tests include: A test of lung function. Test of a mucus sample to look for the presence of bacteria. Tests to check the oxygen level in your blood. Blood tests. Chest X-ray. How is this treated? Most cases of acute bronchitis clear up over time without treatment. Your health care  provider may recommend: Drinking more fluids to help thin your mucus so it is easier to cough up. Taking inhaled medicine (inhaler) to improve air flow in and out of your lungs. Using a vaporizer or a humidifier. These are machines that add water to the air to help you breathe better. Taking a medicine that thins mucus and clears congestion (expectorant). Taking a medicine that prevents or stops coughing (cough suppressant). It is not common to take an antibiotic medicine for this condition. Follow these instructions at home:  Take over-the-counter and prescription medicines only as told by your health care provider. Use an inhaler, vaporizer, or humidifier as told by your health care provider. Take two teaspoons (10 mL) of honey at bedtime to lessen coughing at night. Drink enough fluid to keep your urine pale yellow. Do not use any products that contain nicotine or tobacco. These products include cigarettes, chewing tobacco, and vaping devices, such as e-cigarettes. If you need help quitting, ask your health care provider. Get plenty of rest. Return to your normal activities as told by your health  care provider. Ask your health care provider what activities are safe for you. Keep all follow-up visits. This is important. How is this prevented? To lower your risk of getting this condition again: Wash your hands often with soap and water for at least 20 seconds. If soap and water are not available, use hand sanitizer. Avoid contact with people who have cold symptoms. Try not to touch your mouth, nose, or eyes with your hands. Avoid breathing in smoke or chemical fumes. Breathing smoke or chemical fumes will make your condition worse. Get the flu shot every year. Contact a health care provider if: Your symptoms do not improve after 2 weeks. You have trouble coughing up the mucus. Your cough keeps you awake at night. You have a fever. Get help right away if you: Cough up blood. Feel pain in your chest. Have severe shortness of breath. Faint or keep feeling like you are going to faint. Have a severe headache. Have a fever or chills that get worse. These symptoms may represent a serious problem that is an emergency. Do not wait to see if the symptoms will go away. Get medical help right away. Call your local emergency services (911 in the [place] Do not drive yourself to the hospital. Summary Acute bronchitis is inflammation of the main airways (bronchi) that come off the windpipe (trachea) in the lungs. The swelling causes the airways to get smaller and make more mucus than normal. Drinking more fluids can help thin your mucus so it is easier to cough up. Take over-the-counter and prescription medicines only as told by your health care provider. Do not use any products that contain nicotine or tobacco. These products include cigarettes, chewing tobacco, and vaping devices, such as e-cigarettes. If you need help quitting, ask your health care provider. Contact a health care provider if your symptoms do not improve after 2 weeks. This information is not intended to replace advice given to  you by your health care provider. Make sure you discuss any questions you have with your health care provider. Document Revised: 09/02/2021 Document Reviewed: 09/23/2020 Elsevier Patient Education  2024 [employer]   If you have been instructed to have an in-person evaluation today at a local Urgent Care facility, please use the link below. It will take you to a list of all of our available [facility], including address, phone number and hours of operation. Please  do not delay care.  [facility]  If you or a family member do not have a primary care provider, use the link below to schedule a visit and establish care. When you choose a [facility] primary care physician or advanced practice provider, you gain a long-term partner in health. Find a Primary Care Provider  Learn more about [facility] in-office and virtual care options: [facility] - Get Care Now

## 2023-11-20 ENCOUNTER — Encounter: Payer: Self-pay | Admitting: Internal Medicine

## 2023-11-20 ENCOUNTER — Ambulatory Visit (INDEPENDENT_AMBULATORY_CARE_PROVIDER_SITE_OTHER): Admitting: Internal Medicine

## 2023-11-20 VITALS — BP 126/66 | HR 78 | Temp 98.5°F | Ht 71.25 in | Wt 272.0 lb

## 2023-11-20 DIAGNOSIS — I10 Essential (primary) hypertension: Secondary | ICD-10-CM | POA: Diagnosis not present

## 2023-11-20 DIAGNOSIS — I4891 Unspecified atrial fibrillation: Secondary | ICD-10-CM | POA: Diagnosis not present

## 2023-11-20 DIAGNOSIS — F1320 Sedative, hypnotic or anxiolytic dependence, uncomplicated: Secondary | ICD-10-CM | POA: Insufficient documentation

## 2023-11-20 DIAGNOSIS — F39 Unspecified mood [affective] disorder: Secondary | ICD-10-CM

## 2023-11-20 DIAGNOSIS — M48061 Spinal stenosis, lumbar region without neurogenic claudication: Secondary | ICD-10-CM

## 2023-11-20 DIAGNOSIS — F1120 Opioid dependence, uncomplicated: Secondary | ICD-10-CM

## 2023-11-20 DIAGNOSIS — Z0000 Encounter for general adult medical examination without abnormal findings: Secondary | ICD-10-CM

## 2023-11-20 DIAGNOSIS — R739 Hyperglycemia, unspecified: Secondary | ICD-10-CM | POA: Diagnosis not present

## 2023-11-20 DIAGNOSIS — J22 Unspecified acute lower respiratory infection: Secondary | ICD-10-CM | POA: Diagnosis not present

## 2023-11-20 MED ORDER — DOXYCYCLINE HYCLATE 100 MG PO TABS: 100.0000 mg | ORAL_TABLET | Freq: Two times a day (BID) | ORAL | 0 refills | Status: DC

## 2023-11-20 NOTE — Assessment & Plan Note (Signed)
 Some airway rhonchi Sick for a week or more Will try empiric doxy 100  bid x 7 days

## 2023-11-20 NOTE — Progress Notes (Signed)
 Vision Screening   Right eye Left eye Both eyes  Without correction 20/25 20/20 20/15   With correction     Hearing Screening - Comments:: DOT Physical in Fall 2024

## 2023-11-20 NOTE — Assessment & Plan Note (Signed)
 BP Readings from Last 3 Encounters:  11/20/23 126/66  10/09/23 130/72  08/07/23 110/60   Controlled with metoprolol  and valsartan  hydrochlorothiazide  320/25

## 2023-11-20 NOTE — Progress Notes (Signed)
 Subjective:    Patient ID: Dakota Ellison, male    DOB: 1950-10-07, 73 y.o.   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 No falls Vision is okay Hearing is fine--checked at DOT physical Independent with instrumental ADLs Memory is okay  Sick for a week Did have virtual visit 2 days later---got some prednisone  and tessalon  Still brining up yellow/green stuff Fever at first--not now Some wheezing in throat--but no sig SOB Sore in chest from the cough Runny nose and sneezing as well  Chronic pain is stable Uses the hydrocodone  three times a day Xanax  for his nerves regularly as well--three times a day Continues on sertraline ---but no depression or anhedonia  BMI down to 37 Has been working on eating better  Uses CPAP nightly  Works well for him  No chest pain or regular SOB No palpitations--even with atrial fibrillation No dizziness or syncope No edema  Current Outpatient Medications on File Prior to Visit  Medication Sig Dispense Refill   allopurinol (ZYLOPRIM) 100 MG tablet TAKE 1 TABLET BY MOUTH TWICE DAILY 60 tablet 11   ALPRAZolam  (XANAX ) 0.5 MG tablet TAKE 1 TABLET BY MOUTH 3 TIMES DAILY AS NEEDED 90 tablet 0   apixaban  (ELIQUIS ) 5 MG TABS tablet Take 1 tablet (5 mg total) by mouth 2 (two) times daily. 60 tablet 11   benzonatate  (TESSALON ) 100 MG capsule Take 1 capsule (100 mg total) by mouth 3 (three) times daily as needed. 30 capsule 0   cetirizine (ZYRTEC) 10 MG tablet Take 10 mg by mouth daily.     Cholecalciferol (VITAMIN D ) 50 MCG (2000 UT)  CAPS Take 1 capsule by mouth daily.     ezetimibe  (ZETIA ) 10 MG tablet TAKE 1 TABLET BY MOUTH DAILY 90 tablet 3   Garlic 2000 MG CAPS Take 2,000 mg by mouth 2 (two) times daily.      Ginkgo Biloba 120 MG CAPS Take 120 mg by mouth daily.     HYDROcodone -acetaminophen  (NORCO/VICODIN) 5-325 MG tablet Take 1 tablet by mouth every 6 (six) hours as needed for moderate pain (pain score 4-6). 120 tablet 0   metoprolol  succinate (TOPROL -XL) 50 MG 24 hr tablet Take 1 tablet (50 mg total) by mouth daily. 90 tablet 3   Multiple Vitamin (MULTIVITAMIN WITH MINERALS) TABS tablet Take 1 tablet by mouth daily.     niacin 500 MG CR capsule Take 500 mg by mouth 2 (two) times daily.     Omega-3 Fatty Acids (FISH OIL) 1200 MG CPDR Take 1,200 mg by mouth daily.     omeprazole  (PRILOSEC) 20 MG capsule TAKE 1 CAPSULE BY MOUTH DAILY 90 capsule 3   sertraline  (ZOLOFT ) 25 MG tablet TAKE 1 TABLET BY MOUTH DAILY 90 tablet 3   sildenafil  (VIAGRA ) 100 MG tablet Take as directed (Patient taking differently: Take 100 mg by mouth daily as needed for erectile dysfunction. Take as directed) 10 tablet 5   SYSTANE ULTRA 0.4-0.3 % SOLN Place 1 drop into both eyes 4 (four) times daily as needed (dry/irritated eyes.).     testosterone  cypionate (DEPOTESTOSTERONE CYPIONATE) 200 MG/ML injection Inject 200 mg into the muscle every 14 (fourteen) days. Saturdays  triamcinolone  cream (KENALOG ) 0.1 % Apply 1 Application topically 2 (two) times daily as needed. 45 g 1   Turmeric (QC TUMERIC COMPLEX PO) Take 500 mg by mouth daily.     valsartan -hydrochlorothiazide  (DIOVAN -HCT) 320-25 MG tablet TAKE 1 TABLET BY MOUTH DAILY 90 tablet 3   vitamin C (ASCORBIC ACID) 500 MG tablet Take 500 mg by mouth daily.     zinc gluconate 50 MG tablet Take 50 mg by mouth daily.     No current facility-administered medications on file prior to visit.    Allergies  Allergen Reactions   Atorvastatin  Other (See Comments)    Muscle weakness in his legs    Crestor  [Rosuvastatin ] Other (See Comments)    Severe muscle cramping   Zocor [Simvastatin] Other (See Comments)    Muscle weakness in his legs   Atorvastatin  Calcium  Other (See Comments)    Past Medical History:  Diagnosis Date   Anxiety state 04/08/2014   denies   Complication of anesthesia    and trouble waking up   Degenerative disc disease    Dysthymia    denies   ED (erectile dysfunction)    GERD (gastroesophageal reflux disease)    History of MRSA infection    on lip from cut   Hyperlipemia    Hypertension    take BP med for small leak in heart to keep it small per pt.   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   Procedure: LEFT TOTAL KNEE ARTHROPLASTY;  Surgeon: Liliane Rei, MD;  Location: [facility];  Service: Orthopedics;  Laterality: Left;    TOTAL KNEE ARTHROPLASTY Right 12/17/2018   Procedure: TOTAL KNEE ARTHROPLASTY;  Surgeon: Liliane Rei, MD;  Location: [facility];  Service: Orthopedics;  Laterality: Right;     Family History  Problem Relation Age of Onset   Pneumonia Brother     Social History   Socioeconomic History   Marital status: Divorced    Spouse name: Not on file   Number of children: 1   Years of education: Not on file   Highest education level: Not on file  Occupational History   Occupation: long distance driver    Comment: Retired   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, materials   Tobacco Use   Smoking status: Never    Passive exposure: Past   Smokeless tobacco: Never   Tobacco comments:    CIGARS ONLY--QUIT 2013  Vaping Use   Vaping status: Never Used  Substance and Sexual Activity   Alcohol  use: Yes    Alcohol /week: 0.0 standard drinks of alcohol     Comment: rare//occassional   Drug use: No   Sexual activity: Yes  Other Topics Concern   Not on file  Social History Narrative   Has living will  Son is health care POA   Would accept resuscitation attempts   Likely wouldn't want extended tube feeds if cognitively unaware   Social Drivers of Health   Financial Resource Strain: Not on file  Food Insecurity: Not on file  Transportation Needs: Not on file  Physical Activity: Not on file  Stress: Not on file  Social Connections: Not on file  Intimate Partner Violence: Not on file   Review of Systems Appetite is okay Wears seat belt Teeth are okay--keeps up with dentist No other joint pains---just the back mostly No suspicious skin lesions No heartburn or dysphagia with daily omeprazole  Bowels are fine--no blood Voids okay    Objective:   Physical Exam Constitutional:      Appearance: Normal appearance.  HENT:     Mouth/Throat:     Pharynx: No oropharyngeal exudate or posterior oropharyngeal erythema.   Eyes:     Conjunctiva/sclera: Conjunctivae normal.     Pupils: Pupils are equal, round, and reactive to light.    Cardiovascular:     Rate and Rhythm: Normal rate. Rhythm irregular.     Pulses: Normal pulses.     Heart sounds: No murmur heard.    No gallop.  Pulmonary:     Effort: Pulmonary effort is normal.     Breath sounds: Normal breath sounds. No wheezing or rales.     Comments: Frequent coarse cough Not tight Some scattered rhonchi Abdominal:     Palpations: Abdomen is soft.     Tenderness: There is no abdominal tenderness.   Musculoskeletal:     Cervical back: Neck supple.     Right lower leg: No edema.     Left lower  leg: No edema.  Lymphadenopathy:     Cervical: No cervical adenopathy.   Skin:    Findings: No lesion or rash.   Neurological:     General: No focal deficit present.     Mental Status: He is alert and oriented to person, place, and time.     Comments: Mini-cog--normal  Psychiatric:        Mood and Affect: Mood normal.        Behavior: Behavior normal.            Assessment & Plan:

## 2023-11-20 NOTE — Assessment & Plan Note (Signed)
 Rate is fine on metoprolol  50mg  daily Apixaban  5 bid

## 2023-11-20 NOTE — Assessment & Plan Note (Signed)
 With chronic pain Does okay with tid hydrocodone 

## 2023-11-20 NOTE — Assessment & Plan Note (Signed)
 PDMP reviewed No concerns

## 2023-11-20 NOTE — Addendum Note (Signed)
 Addended by: Bernadene Brewer on: 11/20/2023 09:19 AM   Modules accepted: Orders

## 2023-11-20 NOTE — Assessment & Plan Note (Signed)
 Has improved diet so BMI down to 37 Has chronic pain, HTN, atrial fib, sleep apnea, etc

## 2023-11-20 NOTE — Assessment & Plan Note (Signed)
 Chronic anxiety ---okay on low dose sertraline  and alprazolam 

## 2023-11-20 NOTE — Assessment & Plan Note (Signed)
 I have personally reviewed the Medicare Annual Wellness questionnaire and have noted 1. The patient's medical and social history 2. Their use of alcohol , tobacco or illicit drugs 3. Their current medications and supplements 4. The patient's functional ability including ADL's, fall risks, home safety risks and hearing or visual             impairment. 5. Diet and physical activities 6. Evidence for depression or mood disorders  The patients weight, height, BMI and visual acuity have been recorded in the chart I have made referrals, counseling and provided education to the patient based review of the above and I have provided the pt with a written personalized care plan for preventive services.  I have provided you with a copy of your personalized plan for preventive services. Please take the time to review along with your updated medication list.  Done with colon screening PSA by urologist due to testosterone  use Stays active Flu/COVID updates in the fall Consider RSV

## 2023-11-21 ENCOUNTER — Ambulatory Visit: Payer: Self-pay | Admitting: Internal Medicine

## 2023-11-21 LAB — TSH: TSH: 0.83 m[IU]/L (ref 0.40–4.50)

## 2023-11-21 LAB — HEPATIC FUNCTION PANEL
AG Ratio: 1.6 (calc) (ref 1.0–2.5)
ALT: 40 U/L (ref 9–46)
AST: 18 U/L (ref 10–35)
Albumin: 4.1 g/dL (ref 3.6–5.1)
Alkaline phosphatase (APISO): 73 U/L (ref 35–144)
Bilirubin, Direct: 0.1 mg/dL (ref 0.0–0.2)
Globulin: 2.5 g/dL (ref 1.9–3.7)
Indirect Bilirubin: 0.2 mg/dL (ref 0.2–1.2)
Total Bilirubin: 0.3 mg/dL (ref 0.2–1.2)
Total Protein: 6.6 g/dL (ref 6.1–8.1)

## 2023-11-21 LAB — CBC
HCT: 49 % (ref 38.5–50.0)
Hemoglobin: 15.9 g/dL (ref 13.2–17.1)
MCH: 29.8 pg (ref 27.0–33.0)
MCHC: 32.4 g/dL (ref 32.0–36.0)
MCV: 91.9 fL (ref 80.0–100.0)
MPV: 10.3 fL (ref 7.5–12.5)
Platelets: 211 10*3/uL (ref 140–400)
RBC: 5.33 10*6/uL (ref 4.20–5.80)
RDW: 14.1 % (ref 11.0–15.0)
WBC: 13.3 10*3/uL — ABNORMAL HIGH (ref 3.8–10.8)

## 2023-11-21 LAB — HEMOGLOBIN A1C
Hgb A1c MFr Bld: 6.9 % — ABNORMAL HIGH (ref <5.7)
Mean Plasma Glucose: 151 mg/dL
eAG (mmol/L): 8.4 mmol/L

## 2023-11-21 LAB — LIPID PANEL
Cholesterol: 172 mg/dL (ref <200)
HDL: 49 mg/dL (ref >=40)
LDL Cholesterol (Calc): 85 mg/dL
Non-HDL Cholesterol (Calc): 123 mg/dL (ref <130)
Total CHOL/HDL Ratio: 3.5 (calc) (ref <5.0)
Triglycerides: 286 mg/dL — ABNORMAL HIGH (ref <150)

## 2023-11-21 LAB — RENAL FUNCTION PANEL
Albumin: 4.1 g/dL (ref 3.6–5.1)
BUN: 24 mg/dL (ref 7–25)
CO2: 24 mmol/L (ref 20–32)
Calcium: 9.7 mg/dL (ref 8.6–10.3)
Chloride: 99 mmol/L (ref 98–110)
Creat: 1.01 mg/dL (ref 0.70–1.28)
Glucose, Bld: 115 mg/dL — ABNORMAL HIGH (ref 65–99)
Phosphorus: 3.7 mg/dL (ref 2.1–4.3)
Potassium: 4.2 mmol/L (ref 3.5–5.3)
Sodium: 141 mmol/L (ref 135–146)

## 2023-11-24 ENCOUNTER — Encounter: Admitting: Internal Medicine

## 2023-11-24 DIAGNOSIS — M545 Low back pain, unspecified: Secondary | ICD-10-CM | POA: Diagnosis not present

## 2023-11-27 ENCOUNTER — Telehealth: Payer: Self-pay | Admitting: Cardiovascular Disease

## 2023-11-27 DIAGNOSIS — M48061 Spinal stenosis, lumbar region without neurogenic claudication: Secondary | ICD-10-CM | POA: Diagnosis not present

## 2023-11-27 NOTE — Telephone Encounter (Signed)
   Pre-operative Risk Assessment    Patient Name: Dakota Ellison  DOB: 1950-12-23 MRN: [id number]   Date of last office visit: 03/27/23 Date of next office visit: n/a   Request for Surgical Clearance    Procedure:  lumbar epidural  Date of Surgery:  Clearance TBD                                Surgeon:  Dr. Darryle Running Surgeon's Group or Practice Name:  [facility] Phone number:  [phone] Fax number:  [phone]   Type of Clearance Requested:   - Pharmacy:  Hold Apixaban  (Eliquis ) D?C Eliquis  5 days prior ESI   Type of Anesthesia:  Not Indicated   Additional requests/questions:    Signed, Gwendolyn H Slade   11/27/2023, 3:23 PM

## 2023-11-29 NOTE — Telephone Encounter (Signed)
 Patient with diagnosis of atrial fibrillation on Eliquis  for anticoagulation.    Procedure:  lumbar epidural   Date of Surgery:  Clearance TBD     CHA2DS2-VASc Score = 3   This indicates a 3.2% annual risk of stroke. The patient's score is based upon: CHF History: 0 HTN History: 1 Diabetes History: 1 Stroke History: 0 Vascular Disease History: 0 Age Score: 1 Gender Score: 0    CrCl 115 Platelet count 211  Patient has not had an Afib/aflutter ablation within the last 3 months or DCCV within the last 30 days  Per office protocol, patient can hold Eliquis  for 3 days prior to procedure.   Patient will not need bridging with Lovenox (enoxaparin) around procedure.  Request is for 5 day hold, we traditionally do 3 day hold for any spinal procedures.  Pt has good renal clearance and should be free of medication within this time.  If they prefer 5 day hold, will need to review with primary cardiologist.    **This guidance is not considered finalized until pre-operative APP has relayed final recommendations.**

## 2023-11-30 NOTE — Telephone Encounter (Signed)
   Patient Name: Dakota Ellison  DOB: [birth year] MRN: [id number]  Primary Cardiologist: Evalene Lunger, MD  Chart reviewed as part of pre-operative protocol coverage.  Preop team please notify requesting office that per office protocol and Pharm.D. patient can hold Eliquis  for 3 days prior to procedure.  Patient has good renal clearance and should be free of medication within this timeframe.  The preoperative request is for 5 days, if they feel a 5-day hold is needed we will need to further evaluate with patient's primary cardiologist.  Please also confirm if they will need medical clearance, preop form only indicates that they have requested pharmacy clearance.  Labresha Mellor D Jisele Price, NP 11/30/2023, 3:58 PM

## 2023-11-30 NOTE — Telephone Encounter (Signed)
 Called [facility] to inform them of the following:  that per office protocol and Pharm.D. patient can hold Eliquis  for 3 days prior to procedure. Patient has good renal clearance and should be free of medication within this timeframe. The preoperative request is for 5 days, if they feel a 5-day hold is needed we will need to further evaluate with patient's primary cardiologist.   Also asked if they need medical clearance not just pharmacy clearance.  Office said they will give a call back with clarification.

## 2023-12-01 NOTE — Telephone Encounter (Signed)
 I have faxed notes to requesting office for Dr. Ibazebo to review blood thinner recommendations. Please reply to our preop team if agreeable to recommendations for hold time on blood thinner  eliquis .

## 2023-12-04 ENCOUNTER — Ambulatory Visit (INDEPENDENT_AMBULATORY_CARE_PROVIDER_SITE_OTHER): Admitting: Otolaryngology

## 2023-12-04 ENCOUNTER — Telehealth: Payer: Self-pay | Admitting: Internal Medicine

## 2023-12-04 VITALS — BP 159/80 | HR 83 | Wt 275.4 lb

## 2023-12-04 DIAGNOSIS — Z91198 Patient's noncompliance with other medical treatment and regimen for other reason: Secondary | ICD-10-CM

## 2023-12-04 DIAGNOSIS — I4891 Unspecified atrial fibrillation: Secondary | ICD-10-CM

## 2023-12-04 DIAGNOSIS — G4733 Obstructive sleep apnea (adult) (pediatric): Secondary | ICD-10-CM | POA: Diagnosis not present

## 2023-12-04 DIAGNOSIS — Z6836 Body mass index (BMI) 36.0-36.9, adult: Secondary | ICD-10-CM | POA: Diagnosis not present

## 2023-12-04 DIAGNOSIS — Z6838 Body mass index (BMI) 38.0-38.9, adult: Secondary | ICD-10-CM

## 2023-12-04 DIAGNOSIS — Z789 Other specified health status: Secondary | ICD-10-CM

## 2023-12-04 NOTE — Telephone Encounter (Signed)
 Left message for the pt to call and schedule appt in office for preop clearance per preop APP Jackee Alberts, NP.

## 2023-12-04 NOTE — Patient Instructions (Signed)
  It was very nice to meet you today,   Please see the following link for the [facility] program   [email or web address]  This website has information about how you can connect to other patients who have had Inspire Implant procedure

## 2023-12-04 NOTE — Progress Notes (Signed)
 ENT CONSULT:  Reason for Consult: OSA CPAP intolerance    HPI: Discussed the use of AI scribe software for clinical note transcription with the patient, who gave verbal consent to proceed.  History of Present Illness Dakota Ellison is a 73 year old male with obstructive sleep apnea who presents for evaluation of alternative treatments to CPAP therapy. He was referred by Dr. Neysa for evaluation of alternative treatments for obstructive sleep apnea.  He has a history of obstructive sleep apnea, initially diagnosed in 2010, AHI of 82, no centra mixed apneas, and has been using CPAP therapy since then. He experiences discomfort with the CPAP mask, describing it as uncomfortable and difficult to tolerate. He is interested in exploring alternative treatments, specifically the [facility] implant, and mentions that his CPAP settings are maxed out. He has not undergone a recent sleep study since the initial one in 2010.  His body mass index (BMI) is currently 50, which may affect insurance preapproval for the [facility] implant. 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   He has a past medical history of atrial fibrillation, which occurred two years ago, and he is currently on Eliquis , prescribed by his cardiologist, Dr. Gollan.    Records Reviewed:  Office visit with Dr Neysa 10/07/20  male Never smoker followed for OSA complicated by morbid obesity, HBP, asthma, GERD NPSG 2010, AHI 82/ hr CPAP to 20   Past Medical History:  Diagnosis Date   Anxiety state 04/08/2014   denies   Complication of anesthesia    and trouble waking up   Degenerative disc disease    Dysthymia    denies   ED (erectile dysfunction)    GERD (gastroesophageal reflux disease)    History of MRSA infection    on lip from cut   Hyperlipemia    Hypertension    take BP med for small leak in heart to keep it small per pt.   Mild asthma    as a teen   Morbid  obesity (HCC)    OSA (obstructive sleep apnea)    Osteoarthritis    PONV (postoperative nausea and vomiting)    Rectal fissure    Testosterone  deficiency     Past Surgical History:  Procedure Laterality Date   HERNIA REPAIR     1998   KNEE SURGERY  09/2010   left knee and right knee arthroscopy   OTHER SURGICAL HISTORY     arthroscopic subacromial decompression   OTHER SURGICAL HISTORY     open resection, distal right clavicle   SHOULDER ARTHROSCOPY     right   SPHINCTEROTOMY     TOTAL KNEE ARTHROPLASTY Left 06/11/2018   Procedure: LEFT TOTAL KNEE ARTHROPLASTY;  Surgeon: Melodi Lerner, MD;  Location: [facility];  Service: Orthopedics;  Laterality: Left;    TOTAL KNEE ARTHROPLASTY Right 12/17/2018   Procedure: TOTAL KNEE ARTHROPLASTY;  Surgeon: Melodi Lerner, MD;  Location: [facility];  Service: Orthopedics;  Laterality: Right;     Family History  Problem Relation Age of Onset   Pneumonia Brother     Social History:  reports that he has never smoked. He has been exposed to tobacco smoke. He has never used smokeless tobacco. He reports current alcohol  use. He reports that he does not use drugs.  Allergies:  Allergies  Allergen Reactions   Atorvastatin  Other (See Comments)    Muscle weakness in his legs   Crestor  [  Rosuvastatin ] Other (See Comments)    Severe muscle cramping   Zocor [Simvastatin] Other (See Comments)    Muscle weakness in his legs   Atorvastatin  Calcium  Other (See Comments)    Medications: I have reviewed the patient's current medications.  The PMH, PSH, Medications, Allergies, and SH were reviewed and updated.  ROS: Constitutional: Negative for fever, weight loss and weight gain. Cardiovascular: Negative for chest pain and dyspnea on exertion. Respiratory: Is not experiencing shortness of breath at rest. Gastrointestinal: Negative for nausea and vomiting. Neurological: Negative for headaches. Psychiatric: The patient is not  nervous/anxious  There were no vitals taken for this visit. There is no height or weight on file to calculate BMI.  PHYSICAL EXAM:  Exam: General: Well-developed, well-nourished Respiratory Respiratory effort: Equal inspiration and expiration without stridor Cardiovascular Peripheral Vascular: Warm extremities with equal color/perfusion Eyes: No nystagmus with equal extraocular motion bilaterally Neuro/Psych/Balance: Patient oriented to person, place, and time; Appropriate mood and affect; Gait is intact with no imbalance; Cranial nerves I-XII are intact Head and Face Inspection: Normocephalic and atraumatic without mass or lesion Palpation: Facial skeleton intact without bony stepoffs Salivary Glands: No mass or tenderness Facial Strength: Facial motility symmetric and full bilaterally ENT Pinna: External ear intact and fully developed External canal: Canal is patent with intact skin Tympanic Membrane: Clear and mobile External Nose: No scar or anatomic deformity Internal Nose: Septum is relatively straight. No polyp, or purulence. Mucosal edema and erythema present.  Bilateral inferior turbinate hypertrophy.  Lips, Teeth, and gums: Mucosa and teeth intact and viable TMJ: No pain to palpation with full mobility Oral cavity/oropharynx: No erythema or exudate, no lesions present Friedman IV tongue position Nasopharynx: No mass or lesion with intact mucosa Hypopharynx: Intact mucosa without pooling of secretions Larynx Glottic: Full true vocal cord mobility without lesion or mass Supraglottic: Normal appearing epiglottis and AE folds Interarytenoid Space: Moderate pachydermia&edema Subglottic Space: Patent without lesion or edema Neck Neck and Trachea: Midline trachea without mass or lesion Thyroid : No mass or nodularity Lymphatics: No lymphadenopathy  Procedure: Preoperative diagnosis: OSA CPAP intolerance   Postoperative diagnosis:   Same  Procedure: Flexible fiberoptic  laryngoscopy  Surgeon: Elena Larry, MD  Anesthesia: Topical lidocaine  and Afrin Complications: None Condition is stable throughout exam  Indications and consent:  The patient presents to the clinic with above symptoms. Indirect laryngoscopy view was incomplete. Thus it was recommended that they undergo a flexible fiberoptic laryngoscopy. All of the risks, benefits, and potential complications were reviewed with the patient preoperatively and verbal informed consent was obtained.  Procedure: The patient was seated upright in the clinic. Topical lidocaine  and Afrin were applied to the nasal cavity. After adequate anesthesia had occurred, I then proceeded to pass the flexible telescope into the nasal cavity. The nasal cavity was patent without rhinorrhea or polyp. The nasopharynx was also patent without mass or lesion. The base of tongue was visualized and was normal. There were no signs of pooling of secretions in the piriform sinuses. The true vocal folds were mobile bilaterally. There were no signs of glottic or supraglottic mucosal lesion or mass. There was modreate interarytenoid pachydermia and post cricoid edema. The telescope was then slowly withdrawn and the patient tolerated the procedure throughout.    Studies Reviewed: Date of study 09/24/2008 Split night sleep study AHI of 82  No central or mixed events   Assessment/Plan: No diagnosis found.  Assessment and Plan Assessment & Plan Obstructive Sleep Apnea Chronic obstructive sleep apnea managed with  CPAP. Interested in [place] implant due to CPAP discomfort. BMI of 38 may challenge insurance preapproval. Recent sleep study and drug-induced sleep endoscopy required for implant consideration. Sleep study in 2010 with AHI of 82 no mixed or central events.  - Order new sleep study (established with Dr Neysa) - Order drug-induced sleep endoscopy to rule out [facility] - will schedule - Instructed him to contact Doctor Young's office to  schedule the sleep study.   Body Mass Index (BMI) 38 BMI of 38 may affect insurance preapproval for [facility] implant. Discussed potential need for weight management program if BMI is a barrier to insurance pre-approval  - Advise him to be mindful of diet and exercise. - Consider referral to weight loss management program if BMI remains a barrier for Inspire implant.  Atrial Fibrillation Atrial fibrillation managed with Eliquis . Cardiology clearance needed for Inspire implant if pursued. - Ensure cardiology clearance for Inspire implant if pursued.    Thank you for allowing me to participate in the care of this patient. Please do not hesitate to contact me with any questions or concerns.   Elena Larry, MD Otolaryngology [facility] Phone: [phone] Fax: [phone]    12/04/2023, 7:56 AM

## 2023-12-04 NOTE — Telephone Encounter (Signed)
 Dakota Ellison with [facility] called in stating they do require at least 5-7 day hold and they also need medical clearance.   Please advise.

## 2023-12-04 NOTE — Telephone Encounter (Signed)
 Dr Soldatova from [facility] says that insurance requires an updated in-center sleep study to assess eligibility for Inspire.   Order- Diagnostic NPSG sleep study in-center      dx OSA

## 2023-12-04 NOTE — Telephone Encounter (Signed)
 Left message on patient VM stating we have placed an order for patient to get an updated in-center sleep study to assess eligibility for patient to meet requirements for [facility] Device.    Per Dr. Neysa, ordered a Diagnostic NPSG sleep study in lab at [facility] (in this encounter).  Also per Dr. Soldatova from [facility].

## 2023-12-04 NOTE — Telephone Encounter (Signed)
 I will forward to preop and preop pharm to see notes from surgeon office about hold time for blood thinner.

## 2023-12-04 NOTE — H&P (View-Only) (Signed)
 ENT CONSULT:  Reason for Consult: OSA CPAP intolerance    HPI: Discussed the use of AI scribe software for clinical note transcription with the patient, who gave verbal consent to proceed.  History of Present Illness Dakota Ellison is a 73 year old male with obstructive sleep apnea who presents for evaluation of alternative treatments to CPAP therapy. He was referred by Dr. Neysa for evaluation of alternative treatments for obstructive sleep apnea.  He has a history of obstructive sleep apnea, initially diagnosed in 2010, AHI of 82, no centra mixed apneas, and has been using CPAP therapy since then. He experiences discomfort with the CPAP mask, describing it as uncomfortable and difficult to tolerate. He is interested in exploring alternative treatments, specifically the [facility] implant, and mentions that his CPAP settings are maxed out. He has not undergone a recent sleep study since the initial one in 2010.  His body mass index (BMI) is currently 50, which may affect insurance preapproval for the [facility] implant. 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   He has a past medical history of atrial fibrillation, which occurred two years ago, and he is currently on Eliquis , prescribed by his cardiologist, Dr. Gollan.    Records Reviewed:  Office visit with Dr Neysa 10/07/20  male Never smoker followed for OSA complicated by morbid obesity, HBP, asthma, GERD NPSG 2010, AHI 82/ hr CPAP to 20   Past Medical History:  Diagnosis Date   Anxiety state 04/08/2014   denies   Complication of anesthesia    and trouble waking up   Degenerative disc disease    Dysthymia    denies   ED (erectile dysfunction)    GERD (gastroesophageal reflux disease)    History of MRSA infection    on lip from cut   Hyperlipemia    Hypertension    take BP med for small leak in heart to keep it small per pt.   Mild asthma    as a teen   Morbid  obesity (HCC)    OSA (obstructive sleep apnea)    Osteoarthritis    PONV (postoperative nausea and vomiting)    Rectal fissure    Testosterone  deficiency     Past Surgical History:  Procedure Laterality Date   HERNIA REPAIR     1998   KNEE SURGERY  09/2010   left knee and right knee arthroscopy   OTHER SURGICAL HISTORY     arthroscopic subacromial decompression   OTHER SURGICAL HISTORY     open resection, distal right clavicle   SHOULDER ARTHROSCOPY     right   SPHINCTEROTOMY     TOTAL KNEE ARTHROPLASTY Left 06/11/2018   Procedure: LEFT TOTAL KNEE ARTHROPLASTY;  Surgeon: Melodi Lerner, MD;  Location: [facility];  Service: Orthopedics;  Laterality: Left;    TOTAL KNEE ARTHROPLASTY Right 12/17/2018   Procedure: TOTAL KNEE ARTHROPLASTY;  Surgeon: Melodi Lerner, MD;  Location: [facility];  Service: Orthopedics;  Laterality: Right;     Family History  Problem Relation Age of Onset   Pneumonia Brother     Social History:  reports that he has never smoked. He has been exposed to tobacco smoke. He has never used smokeless tobacco. He reports current alcohol  use. He reports that he does not use drugs.  Allergies:  Allergies  Allergen Reactions   Atorvastatin  Other (See Comments)    Muscle weakness in his legs   Crestor  [  Rosuvastatin ] Other (See Comments)    Severe muscle cramping   Zocor [Simvastatin] Other (See Comments)    Muscle weakness in his legs   Atorvastatin  Calcium  Other (See Comments)    Medications: I have reviewed the patient's current medications.  The PMH, PSH, Medications, Allergies, and SH were reviewed and updated.  ROS: Constitutional: Negative for fever, weight loss and weight gain. Cardiovascular: Negative for chest pain and dyspnea on exertion. Respiratory: Is not experiencing shortness of breath at rest. Gastrointestinal: Negative for nausea and vomiting. Neurological: Negative for headaches. Psychiatric: The patient is not  nervous/anxious  There were no vitals taken for this visit. There is no height or weight on file to calculate BMI.  PHYSICAL EXAM:  Exam: General: Well-developed, well-nourished Respiratory Respiratory effort: Equal inspiration and expiration without stridor Cardiovascular Peripheral Vascular: Warm extremities with equal color/perfusion Eyes: No nystagmus with equal extraocular motion bilaterally Neuro/Psych/Balance: Patient oriented to person, place, and time; Appropriate mood and affect; Gait is intact with no imbalance; Cranial nerves I-XII are intact Head and Face Inspection: Normocephalic and atraumatic without mass or lesion Palpation: Facial skeleton intact without bony stepoffs Salivary Glands: No mass or tenderness Facial Strength: Facial motility symmetric and full bilaterally ENT Pinna: External ear intact and fully developed External canal: Canal is patent with intact skin Tympanic Membrane: Clear and mobile External Nose: No scar or anatomic deformity Internal Nose: Septum is relatively straight. No polyp, or purulence. Mucosal edema and erythema present.  Bilateral inferior turbinate hypertrophy.  Lips, Teeth, and gums: Mucosa and teeth intact and viable TMJ: No pain to palpation with full mobility Oral cavity/oropharynx: No erythema or exudate, no lesions present Friedman IV tongue position Nasopharynx: No mass or lesion with intact mucosa Hypopharynx: Intact mucosa without pooling of secretions Larynx Glottic: Full true vocal cord mobility without lesion or mass Supraglottic: Normal appearing epiglottis and AE folds Interarytenoid Space: Moderate pachydermia&edema Subglottic Space: Patent without lesion or edema Neck Neck and Trachea: Midline trachea without mass or lesion Thyroid : No mass or nodularity Lymphatics: No lymphadenopathy  Procedure: Preoperative diagnosis: OSA CPAP intolerance   Postoperative diagnosis:   Same  Procedure: Flexible fiberoptic  laryngoscopy  Surgeon: Elena Larry, MD  Anesthesia: Topical lidocaine  and Afrin Complications: None Condition is stable throughout exam  Indications and consent:  The patient presents to the clinic with above symptoms. Indirect laryngoscopy view was incomplete. Thus it was recommended that they undergo a flexible fiberoptic laryngoscopy. All of the risks, benefits, and potential complications were reviewed with the patient preoperatively and verbal informed consent was obtained.  Procedure: The patient was seated upright in the clinic. Topical lidocaine  and Afrin were applied to the nasal cavity. After adequate anesthesia had occurred, I then proceeded to pass the flexible telescope into the nasal cavity. The nasal cavity was patent without rhinorrhea or polyp. The nasopharynx was also patent without mass or lesion. The base of tongue was visualized and was normal. There were no signs of pooling of secretions in the piriform sinuses. The true vocal folds were mobile bilaterally. There were no signs of glottic or supraglottic mucosal lesion or mass. There was modreate interarytenoid pachydermia and post cricoid edema. The telescope was then slowly withdrawn and the patient tolerated the procedure throughout.    Studies Reviewed: Date of study 09/24/2008 Split night sleep study AHI of 82  No central or mixed events   Assessment/Plan: No diagnosis found.  Assessment and Plan Assessment & Plan Obstructive Sleep Apnea Chronic obstructive sleep apnea managed with  CPAP. Interested in [place] implant due to CPAP discomfort. BMI of 38 may challenge insurance preapproval. Recent sleep study and drug-induced sleep endoscopy required for implant consideration. Sleep study in 2010 with AHI of 82 no mixed or central events.  - Order new sleep study (established with Dr Neysa) - Order drug-induced sleep endoscopy to rule out [facility] - will schedule - Instructed him to contact Doctor Young's office to  schedule the sleep study.   Body Mass Index (BMI) 38 BMI of 38 may affect insurance preapproval for [facility] implant. Discussed potential need for weight management program if BMI is a barrier to insurance pre-approval  - Advise him to be mindful of diet and exercise. - Consider referral to weight loss management program if BMI remains a barrier for [facility] implant.  Atrial Fibrillation Atrial fibrillation managed with Eliquis . Cardiology clearance needed for [facility] implant if pursued. - Ensure cardiology clearance for [facility] implant if pursued.    Thank you for allowing me to participate in the care of this patient. Please do not hesitate to contact me with any questions or concerns.   Elena Larry, MD Otolaryngology [facility] Phone: [phone] Fax: [phone]    12/04/2023, 7:56 AM

## 2023-12-04 NOTE — Telephone Encounter (Signed)
   Name: Dakota Ellison  DOB: 1951-04-03  MRN: [id number]  Primary Cardiologist: Evalene Lunger, MD  Chart reviewed as part of pre-operative protocol coverage. Because of [name]'s past medical history and time since last visit, he will require a follow-up in-office visit in order to better assess preoperative cardiovascular risk and to discuss holding Eliquis  5 days prior to procedure.  Pre-op covering staff: - Please schedule appointment and call patient to inform them. If patient already had an upcoming appointment within acceptable timeframe, please add pre-op clearance to the appointment notes so provider is aware. - Please contact requesting surgeon's office via preferred method (i.e, phone, fax) to inform them of need for appointment prior to surgery.    Wyn Raddle, Jackee Shove, NP  12/04/2023, 9:54 AM

## 2023-12-05 ENCOUNTER — Telehealth: Payer: Self-pay

## 2023-12-05 ENCOUNTER — Other Ambulatory Visit: Payer: Self-pay | Admitting: Internal Medicine

## 2023-12-05 NOTE — Telephone Encounter (Signed)
 I will forward to preop APP for review of notes from Dr. Gollan.

## 2023-12-05 NOTE — Telephone Encounter (Signed)
 COPIED THIS NOTE TO ENCOUNTER DATED 12/04/2023. DUPLICATE ENOUNTER REGARDING SAME REQUEST.  WILL CLOSE THIS ENCOUNTER SEE TELEPHONE ENCOUNTER 12/04/2023!!!!   Copied from CRM [id number]. Topic: Appointments - Scheduling Inquiry for Clinic >> Dec 04, 2023  9:56 AM Dakota Ellison wrote: Reason for CRM: Patient states the provider Dr. Neysa sent him to (Dr. Okey) has reccommended the patient to set up a sleep study. Please call patient back and advise. >> Dec 04, 2023  3:29 PM Dakota Ellison wrote: Pt received a voicemail to contact office. Reviewed chart and it appears Evelean Bigler reached out to him, after submitting an order for sleep study that is required for [facility] device procedure. Contacted CAL, no answer. Appears he may need scheduling for the study.  Requests call back  CB#  [phone]

## 2023-12-05 NOTE — Telephone Encounter (Signed)
 Copied from CRM [id number]. Topic: Appointments - Scheduling Inquiry for Clinic >> Dec 04, 2023  9:56 AM [name] wrote: Reason for CRM: Patient states the provider Dr. Neysa sent him to (Dr. Okey) has reccommended the patient to set up a sleep study. Please call patient back and advise. >> Dec 04, 2023  3:29 PM [name] wrote: Pt received a voicemail to contact office. Reviewed chart and it appears Dakota Ellison reached out to him, after submitting an order for sleep study that is required for [facility] device procedure. Contacted CAL, no answer. Appears he may need scheduling for the study.   Requests call back   CB#   [phone]

## 2023-12-05 NOTE — Telephone Encounter (Signed)
 Pt aware of appt 12/11/23

## 2023-12-05 NOTE — Telephone Encounter (Signed)
 Called patient.  Informed of order per Dr. Neysa for in-lab sleep study that is required for [facility] device procedure.  Patient should get a call to schedule this study from [facility] in next few days.  Patient verbalized understanding.  NFN at this time.

## 2023-12-06 NOTE — Telephone Encounter (Signed)
 Name of Medication: Hydrocodone  apap 5-325 mg Name of Pharmacy: [facility] Last Fill or Written Date and Quantity: 11-06-23 # 120 Last Office Visit and Type: 11-20-23 Next Office Visit and Type: 02-12-24 Last Controlled Substance Agreement Date: 05-15-23 Last UDS: 05-15-23   Alprazolam  last filled 11-06-23 #90

## 2023-12-11 ENCOUNTER — Ambulatory Visit: Attending: Physician Assistant | Admitting: Physician Assistant

## 2023-12-11 ENCOUNTER — Encounter: Payer: Self-pay | Admitting: Physician Assistant

## 2023-12-11 VITALS — BP 138/73 | HR 65 | Ht 72.0 in | Wt 279.0 lb

## 2023-12-11 DIAGNOSIS — I10 Essential (primary) hypertension: Secondary | ICD-10-CM

## 2023-12-11 DIAGNOSIS — Z01810 Encounter for preprocedural cardiovascular examination: Secondary | ICD-10-CM

## 2023-12-11 DIAGNOSIS — I4819 Other persistent atrial fibrillation: Secondary | ICD-10-CM

## 2023-12-11 DIAGNOSIS — E782 Mixed hyperlipidemia: Secondary | ICD-10-CM

## 2023-12-11 NOTE — Progress Notes (Signed)
 Cardiology Office Note    Date:  12/11/2023   ID:  Dakota, Ellison 06/02/1951, MRN [id number]  PCP:  Jimmy Charlie FERNS, MD  Cardiologist:  Evalene Lunger, MD  Electrophysiologist:  None   Chief Complaint: Preprocedure pharmacological cardiac risk stratification  History of Present Illness:   Dakota Ellison is a 73 y.o. male with history of persistent A-fib on apixaban , HTN, HLD with statin intolerance, chronic back pain, obesity, and OSA on CPAP who presents for preprocedure cardiac risk stratification for lumbar epidural.  Echo in 08/2016 showed preserved LV systolic function, mildly dilated RV, mild mitral regurgitation, and mild tricuspid regurgitation.  Stress test in 2008 indicated the patient achieved 10 METs, exercising for 9 minutes, and was without ischemia and overall low risk.  Calcium  score in 2019 of 0.  He was diagnosed with A-fib in 01/2022 and started on Toprol  and apixaban .  He converted to sinus rhythm without intervention.  EKGs have demonstrated sinus rhythm since.  Our office was asked to weigh in on interruption of anticoagulation for lumbar epidural.  As part of our preoperative workflow, the patient's chart was reviewed by our clinical pharmacy team with recommendation for patient to hold apixaban  for 3 days prior to procedure.  The requesting office has asked that the patient hold his apixaban  for 5 days.  He comes in doing well from a cardiac perspective and is without symptoms of angina or cardiac decompensation.  No palpitations, dizziness, presyncope, or syncope.  No falls, hematochezia, or melena.  Reports he was previously getting epidural injections for chronic back pain and neuroma every 3 months without cardiac complication and would typically hold apixaban  for 2 days prior.  For ease of scheduling, he has transitioned these injections to an office in [place].  [facility] Activity Status Index: Greater than 4 METs without cardiac limitation   Labs independently  reviewed: 11/2023 - TSH normal, BUN 24, serum creatinine 1.01, potassium 4.2, albumin 4.1, TC 172, TG 286, HDL 49, LDL 85, AST/ALT normal, A1c 6.9, Hgb 15.9, PLT 211  Past Medical History:  Diagnosis Date   Anxiety state 04/08/2014   denies   Complication of anesthesia    and trouble waking up   Degenerative disc disease    Dysthymia    denies   ED (erectile dysfunction)    GERD (gastroesophageal reflux disease)    History of MRSA infection    on lip from cut   Hyperlipemia    Hypertension    take BP med for small leak in heart to keep it small per pt.   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   Procedure: LEFT TOTAL KNEE ARTHROPLASTY;  Surgeon: Melodi Lerner, MD;  Location: [facility];  Service: Orthopedics;  Laterality: Left;    TOTAL KNEE ARTHROPLASTY Right 12/17/2018  Procedure: TOTAL KNEE ARTHROPLASTY;  Surgeon: Melodi Lerner, MD;  Location: [facility];  Service: Orthopedics;  Laterality: Right;     Current Medications: Current Meds  Medication Sig   allopurinol (ZYLOPRIM) 100 MG tablet TAKE 1 TABLET BY MOUTH TWICE DAILY   ALPRAZolam  (XANAX ) 0.5 MG tablet TAKE 1 TABLET BY MOUTH 3 TIMES DAILY AS NEEDED   apixaban  (ELIQUIS ) 5 MG TABS tablet Take 1 tablet (5 mg total) by mouth 2 (two) times daily.   benzonatate  (TESSALON ) 100 MG capsule Take 1 capsule (100 mg total) by mouth 3 (three) times daily as needed.   cetirizine (ZYRTEC) 10 MG tablet Take 10  mg by mouth daily.   Cholecalciferol (VITAMIN D ) 50 MCG (2000 UT) CAPS Take 1 capsule by mouth daily.   doxycycline  (VIBRA -TABS) 100 MG tablet Take 1 tablet (100 mg total) by mouth 2 (two) times daily.   ezetimibe  (ZETIA ) 10 MG tablet TAKE 1 TABLET BY MOUTH DAILY   Garlic 2000 MG CAPS Take 2,000 mg by mouth 2 (two) times daily.    Ginkgo Biloba 120 MG CAPS Take 120 mg by mouth daily.   HYDROcodone -acetaminophen  (NORCO/VICODIN) 5-325 MG tablet Take 1 tablet by mouth every 6 (six) hours as needed for moderate pain (pain score 4-6).   metoprolol  succinate (TOPROL -XL) 50 MG 24 hr tablet Take 1 tablet (50 mg total) by mouth daily.   Multiple Vitamin (MULTIVITAMIN WITH MINERALS) TABS tablet Take 1 tablet by mouth daily.   niacin 500 MG CR capsule Take 500 mg by mouth 2 (two) times daily.   Omega-3 Fatty Acids (FISH OIL) 1200 MG CPDR Take 1,200 mg by mouth daily.   omeprazole  (PRILOSEC) 20 MG capsule TAKE 1 CAPSULE BY MOUTH DAILY   sertraline  (ZOLOFT ) 25 MG tablet TAKE 1 TABLET BY MOUTH DAILY   sildenafil  (VIAGRA ) 100 MG tablet Take as directed (Patient taking differently: Take 100 mg by mouth daily as needed for erectile dysfunction. Take as directed)   SYSTANE ULTRA 0.4-0.3 % SOLN Place 1 drop into both eyes 4 (four) times daily as needed (dry/irritated eyes.).   testosterone  cypionate (DEPOTESTOSTERONE CYPIONATE) 200 MG/ML injection Inject 200 mg into the muscle every 14 (fourteen) days. Saturdays   triamcinolone  cream (KENALOG ) 0.1 % Apply 1 Application topically 2 (two) times daily as needed.   Turmeric (QC TUMERIC COMPLEX PO) Take 500 mg by mouth daily.   valsartan -hydrochlorothiazide  (DIOVAN -HCT) 320-25 MG tablet TAKE 1 TABLET BY MOUTH DAILY   vitamin C (ASCORBIC ACID) 500 MG tablet Take 500 mg by mouth daily.   zinc gluconate 50 MG tablet Take 50 mg by mouth daily.    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, materials  Tobacco Use   Smoking status: Never    Passive exposure: Past   Smokeless tobacco: Never   Tobacco comments:    CIGARS ONLY--QUIT 2013  Vaping Use   Vaping status: Never Used  Substance and Sexual Activity   Alcohol  use: Yes    Alcohol /week: 0.0 standard drinks of alcohol     Comment: rare//occassional   Drug  use: No   Sexual activity: Yes  Other Topics Concern   Not on file  Social History Narrative   Has living will   Son is health care POA   Would accept resuscitation attempts   Likely wouldn't want extended tube feeds if cognitively unaware   Social Drivers of Health   Financial Resource Strain: Not on file  Food Insecurity: Not on file  Transportation Needs: Not on file  Physical Activity: Not on file  Stress: Not on file  Social Connections: Not on file     Family History:  The patient's family history includes Pneumonia in his brother.  ROS:   12-point review of systems is negative unless otherwise noted in the HPI.   EKGs/Labs/Other Studies Reviewed:    Studies reviewed were summarized above. The additional studies were reviewed today:  Calcium  score 04/04/2018: IMPRESSION: Coronary calcium  score of 0. This was 0 percentile for age and sex matched control.   EKG:  EKG is ordered today.  The EKG ordered today demonstrates NSR, 65 bpm, no acute ST-T changes  Recent Labs: 11/20/2023: ALT 40; BUN 24; Creat 1.01; Hemoglobin 15.9; Platelets 211; Potassium 4.2; Sodium 141; TSH 0.83  Recent Lipid Panel    Component Value Date/Time   CHOL 172 11/20/2023 0919   TRIG 286 (H) 11/20/2023 0919   HDL 49 11/20/2023 0919   CHOLHDL  3.5 11/20/2023 0919   VLDL 30.0 05/11/2021 1237   LDLCALC 85 11/20/2023 0919   LDLDIRECT 52.0 08/15/2022 1241    PHYSICAL EXAM:    VS:  BP 138/73 (BP Location: Left Arm, Patient Position: Sitting, Cuff Size: Large)   Pulse 65   Ht 6' (1.829 m)   Wt 279 lb (126.6 kg)   SpO2 97%   BMI 37.84 kg/m   BMI: Body mass index is 37.84 kg/m.  Physical Exam Vitals reviewed.  Constitutional:      Appearance: He is well-developed.  HENT:     Head: Normocephalic and atraumatic.  Eyes:     General:        Right eye: No discharge.        Left eye: No discharge.  Cardiovascular:     Rate and Rhythm: Normal rate and regular rhythm.     Heart sounds: Normal heart sounds, S1 normal and S2 normal. Heart sounds not distant. No midsystolic click and no opening snap. No murmur heard.    No friction rub.  Pulmonary:     Effort: Pulmonary effort is normal. No respiratory distress.     Breath sounds: Normal breath sounds. No decreased breath sounds, wheezing, rhonchi or rales.  Chest:     Chest wall: No tenderness.  Musculoskeletal:     Cervical back: Normal range of motion.  Skin:    General: Skin is warm and dry.     Nails: There is no clubbing.  Neurological:     Mental Status: He is alert and oriented to person, place, and time.  Psychiatric:        Speech: Speech normal.        Behavior: Behavior normal.        Thought Content: Thought content normal.        Judgment: Judgment normal.     Wt Readings from Last 3 Encounters:  12/11/23 279 lb (126.6 kg)  12/04/23 275 lb 6.4 oz (124.9 kg)  11/20/23 272 lb (123.4 kg)     ASSESSMENT & PLAN:   Preprocedure pharmacological cardiac risk stratification: The patient is  planning to undergo a lumbar epidural with a date to be determined.  The chart was reviewed by our clinical pharmacy team with recommendation to hold apixaban  for 3 days.  Documentation from the requesting office indicates a 5-day hold.  We recommend holding apixaban  3 days  prior to lumbar epidural injection.  A hold of 5 days is typically not indicated for these types of injections, particularly in the setting of normal renal function.  If the performing office continues to recommend a 5-day hold of apixaban , they will need to assume the increased stroke risk.  Patient also indicates he does not feel comfortable holding apixaban  for such a prolonged timeframe.  Persistent A-fib: Maintaining sinus rhythm on Toprol -XL 50 mg.  CHA2DS2-VASc at least  2 (HTN, age x 1).  He remains on apixaban  5 mg twice daily does not meet reduced dosing criteria.  Recent labs stable.  HTN: Blood pressure: Is well-controlled in office today.  Mains on Toprol -XL and Diovan  HCT.  HLD with statin intolerance: LDL 85 in 11/2023.  Remains on ezetimibe  10 mg.   Disposition: F/u with Dr. Gollan or an APP in 12 months.   Medication Adjustments/Labs and Tests Ordered: Current medicines are reviewed at length with the patient today.  Concerns regarding medicines are outlined above. Medication changes, Labs and Tests ordered today are summarized above and listed in the Patient Instructions accessible in Encounters.   Signed, Bernardino Bring, PA-C 12/11/2023 1:04 PM     [facility] [place] [address] [place], [place] [address] [phone]

## 2023-12-11 NOTE — Patient Instructions (Signed)
 Medication Instructions:  Your physician recommends that you continue on your current medications as directed. Please refer to the Current Medication list given to you today.   *If you need a refill on your cardiac medications before your next appointment, please call your pharmacy*  Lab Work: None ordered at this time   Follow-Up: At [facility], you and your health needs are our priority.  As part of our continuing mission to provide you with exceptional heart care, our providers are all part of one team.  This team includes your primary Cardiologist (physician) and Advanced Practice Providers or APPs (Physician Assistants and Nurse Practitioners) who all work together to provide you with the care you need, when you need it.  Your next appointment:   12 month(s)  Provider:   You may see Timothy Gollan, MD or Bernardino Bring, PA-C  We recommend signing up for the patient portal called MyChart.  Sign up information is provided on this After Visit Summary.  MyChart is used to connect with patients for Virtual Visits (Telemedicine).  Patients are able to view lab/test results, encounter notes, upcoming appointments, etc.  Non-urgent messages can be sent to your provider as well.   To learn more about what you can do with MyChart, go to [email or web address].   Please hold your Eliquis  3 days prior to your lumbar epidural.

## 2023-12-19 ENCOUNTER — Encounter (HOSPITAL_BASED_OUTPATIENT_CLINIC_OR_DEPARTMENT_OTHER): Payer: Self-pay | Admitting: *Deleted

## 2023-12-19 ENCOUNTER — Other Ambulatory Visit: Payer: Self-pay

## 2023-12-21 ENCOUNTER — Encounter (HOSPITAL_BASED_OUTPATIENT_CLINIC_OR_DEPARTMENT_OTHER)
Admission: RE | Admit: 2023-12-21 | Discharge: 2023-12-21 | Disposition: A | Discharge status: Home / Self Care | Source: Ambulatory Visit | Attending: Otolaryngology | Admitting: Otolaryngology

## 2023-12-21 DIAGNOSIS — Z01818 Encounter for other preprocedural examination: Secondary | ICD-10-CM | POA: Insufficient documentation

## 2023-12-21 LAB — BASIC METABOLIC PANEL WITH GFR
Anion gap: 12 (ref 5–15)
BUN: 33 mg/dL — ABNORMAL HIGH (ref 8–23)
CO2: 22 mmol/L (ref 22–32)
Calcium: 9.4 mg/dL (ref 8.9–10.3)
Chloride: 104 mmol/L (ref 98–111)
Creatinine, Ser: 1.29 mg/dL — ABNORMAL HIGH (ref 0.61–1.24)
GFR, Estimated: 59 mL/min — ABNORMAL LOW (ref >60)
Glucose, Bld: 174 mg/dL — ABNORMAL HIGH (ref 70–99)
Potassium: 4.4 mmol/L (ref 3.5–5.1)
Sodium: 138 mmol/L (ref 135–145)

## 2023-12-21 NOTE — Anesthesia Preprocedure Evaluation (Addendum)
 Anesthesia Evaluation  Patient identified by MRN, date of birth, ID band Patient awake    Reviewed: Allergy & Precautions, NPO status , Patient's Chart, lab work & pertinent test results  History of Anesthesia Complications (+) PONV, PROLONGED EMERGENCE and history of anesthetic complications  Airway Mallampati: III  TM Distance: >3 FB Neck ROM: Full    Dental  (+) Dental Advisory Given,    Pulmonary neg shortness of breath, asthma , sleep apnea , neg COPD, Recent URI  (1 month ago), Resolved, former smoker   Pulmonary exam normal breath sounds clear to auscultation       Cardiovascular hypertension (metoprolol , valsartan -HCTZ), Pt. on home beta blockers and Pt. on medications (-) angina (-) Past MI, (-) Cardiac Stents and (-) CABG + dysrhythmias Atrial Fibrillation + Valvular Problems/Murmurs (mild MR/TR)  Rhythm:Regular Rate:Normal  HLD   Neuro/Psych neg Seizures PSYCHIATRIC DISORDERS Anxiety Depression    Chronic back pain, takes Norco  Neuromuscular disease (lumbar spinal stenosis)    GI/Hepatic Neg liver ROS,GERD  Medicated,,  Endo/Other  negative endocrine ROS    Renal/GU Renal disease (cyst of left kidney)     Musculoskeletal  (+) Arthritis ,    Abdominal  (+) + obese  Peds  Hematology negative hematology ROS (+) Lab Results      Component                Value               Date                      WBC                      13.3 (H)            11/20/2023                HGB                      15.9                11/20/2023                HCT                      49.0                11/20/2023                MCV                      91.9                11/20/2023                PLT                      211                 11/20/2023              Anesthesia Other Findings Last Eliquis : 12/21/2023  Reproductive/Obstetrics                              Anesthesia Physical Anesthesia  Plan  ASA: 3  Anesthesia Plan: MAC   Post-op Pain Management: Minimal or  no pain anticipated   Induction: Intravenous  PONV Risk Score and Plan: 2 and Propofol  infusion, TIVA and Treatment may vary due to age or medical condition  Airway Management Planned: Natural Airway and Nasal Cannula  Additional Equipment:   Intra-op Plan:   Post-operative Plan:   Informed Consent: I have reviewed the patients History and Physical, chart, labs and discussed the procedure including the risks, benefits and alternatives for the proposed anesthesia with the patient or authorized representative who has indicated his/her understanding and acceptance.     Dental advisory given  Plan Discussed with: CRNA and Anesthesiologist  Anesthesia Plan Comments: (Discussed with patient risks of MAC including, but not limited to, minor pain or discomfort, hearing people in the room, and possible need for backup general anesthesia. Risks for general anesthesia also discussed including, but not limited to, sore throat, hoarse voice, chipped/damaged teeth, injury to vocal cords, nausea and vomiting, allergic reactions, lung infection, heart attack, stroke, and death. All questions answered. )         Anesthesia Quick Evaluation

## 2023-12-25 ENCOUNTER — Ambulatory Visit (HOSPITAL_BASED_OUTPATIENT_CLINIC_OR_DEPARTMENT_OTHER)
Admission: RE | Admit: 2023-12-25 | Discharge: 2023-12-25 | Disposition: A | Discharge status: Home / Self Care | Attending: Otolaryngology | Admitting: Otolaryngology

## 2023-12-25 ENCOUNTER — Ambulatory Visit (HOSPITAL_BASED_OUTPATIENT_CLINIC_OR_DEPARTMENT_OTHER): Payer: Self-pay | Admitting: Anesthesiology

## 2023-12-25 ENCOUNTER — Encounter (HOSPITAL_BASED_OUTPATIENT_CLINIC_OR_DEPARTMENT_OTHER): Payer: Self-pay | Admitting: Anesthesiology

## 2023-12-25 ENCOUNTER — Encounter (HOSPITAL_BASED_OUTPATIENT_CLINIC_OR_DEPARTMENT_OTHER)
Admission: RE | Disposition: A | Discharge status: Home / Self Care | Payer: Self-pay | Source: Home / Self Care | Attending: Otolaryngology

## 2023-12-25 DIAGNOSIS — G4733 Obstructive sleep apnea (adult) (pediatric): Secondary | ICD-10-CM | POA: Diagnosis present

## 2023-12-25 DIAGNOSIS — I10 Essential (primary) hypertension: Secondary | ICD-10-CM | POA: Insufficient documentation

## 2023-12-25 DIAGNOSIS — Z789 Other specified health status: Secondary | ICD-10-CM

## 2023-12-25 DIAGNOSIS — F418 Other specified anxiety disorders: Secondary | ICD-10-CM | POA: Diagnosis not present

## 2023-12-25 DIAGNOSIS — Z87891 Personal history of nicotine dependence: Secondary | ICD-10-CM | POA: Diagnosis not present

## 2023-12-25 DIAGNOSIS — I4891 Unspecified atrial fibrillation: Secondary | ICD-10-CM | POA: Diagnosis not present

## 2023-12-25 DIAGNOSIS — Z7901 Long term (current) use of anticoagulants: Secondary | ICD-10-CM | POA: Diagnosis not present

## 2023-12-25 DIAGNOSIS — Z6838 Body mass index (BMI) 38.0-38.9, adult: Secondary | ICD-10-CM | POA: Diagnosis not present

## 2023-12-25 HISTORY — DX: Other chronic pain: G89.29

## 2023-12-25 HISTORY — PX: DRUG INDUCED ENDOSCOPY: SHX6808

## 2023-12-25 SURGERY — DRUG INDUCED SLEEP ENDOSCOPY
Anesthesia: Monitor Anesthesia Care | Site: Nose

## 2023-12-25 MED ORDER — LIDOCAINE HCL (CARDIAC) PF 100 MG/5ML IV SOSY
PREFILLED_SYRINGE | INTRAVENOUS | Status: DC | PRN
  Administered 2023-12-25: 40 mg via INTRAVENOUS

## 2023-12-25 MED ORDER — LACTATED RINGERS IV SOLN: INTRAVENOUS | Status: DC

## 2023-12-25 MED ORDER — PROPOFOL 500 MG/50ML IV EMUL
INTRAVENOUS | Status: DC | PRN
  Administered 2023-12-25: 75 ug/kg/min via INTRAVENOUS

## 2023-12-25 MED ORDER — PROPOFOL 10 MG/ML IV BOLUS
INTRAVENOUS | Status: AC
  Filled 2023-12-25: qty 20

## 2023-12-25 MED ORDER — FENTANYL CITRATE (PF) 100 MCG/2ML IJ SOLN: 25.0000 ug | INTRAMUSCULAR | Status: DC | PRN

## 2023-12-25 MED ORDER — ONDANSETRON HCL 4 MG/2ML IJ SOLN
INTRAMUSCULAR | Status: DC | PRN
  Administered 2023-12-25: 4 mg via INTRAVENOUS

## 2023-12-25 MED ORDER — PROPOFOL 10 MG/ML IV BOLUS
INTRAVENOUS | Status: DC | PRN
  Administered 2023-12-25 (×2): 20 mg via INTRAVENOUS

## 2023-12-25 MED ORDER — ACETAMINOPHEN 10 MG/ML IV SOLN: 1000.0000 mg | Freq: Once | INTRAVENOUS | Status: DC | PRN

## 2023-12-25 MED ORDER — LIDOCAINE-EPINEPHRINE 1 %-1:100000 IJ SOLN
INTRAMUSCULAR | Status: AC
  Filled 2023-12-25: qty 1

## 2023-12-25 MED ORDER — OXYMETAZOLINE HCL 0.05 % NA SOLN
NASAL | Status: AC
Start: 2023-12-25 — End: 2023-12-25
  Filled 2023-12-25: qty 30

## 2023-12-25 SURGICAL SUPPLY — 13 items
BRONCHOSCOPE PED SLIM DISP (MISCELLANEOUS) ×1 IMPLANT
CANISTER SUCT 1200ML W/VALVE (MISCELLANEOUS) IMPLANT
GLOVE BIO SURGEON STRL SZ 6 (GLOVE) ×1 IMPLANT
KIT CLEAN ENDO (MISCELLANEOUS) IMPLANT
NDL HYPO 22X1.5 SAFETY MO (MISCELLANEOUS) ×1 IMPLANT
NEEDLE HYPO 22X1.5 SAFETY MO (MISCELLANEOUS) IMPLANT
PATTIES SURGICAL .5 X3 (DISPOSABLE) IMPLANT
SHEET MEDIUM DRAPE 40X70 STRL (DRAPES) IMPLANT
SOLUTION ANTFG W/FOAM PAD STRL (MISCELLANEOUS) ×1 IMPLANT
SURGILUBE 2OZ TUBE FLIPTOP (MISCELLANEOUS) ×1 IMPLANT
SYR 10ML LL (SYRINGE) ×1 IMPLANT
TOWEL GREEN STERILE FF (TOWEL DISPOSABLE) ×1 IMPLANT
TUBE CONNECTING 20X1/4 (TUBING) IMPLANT

## 2023-12-25 NOTE — Interval H&P Note (Signed)
 History and Physical Interval Note:  12/25/2023 11:28 AM  Dakota Ellison Ao  has presented today for surgery, with the diagnosis of Obstructive sleep apnea.  The various methods of treatment have been discussed with the patient and family. After consideration of risks, benefits and other options for treatment, the patient has consented to  Procedure(s): DRUG INDUCED SLEEP ENDOSCOPY (N/A) as a surgical intervention.  The patient's history has been reviewed, patient examined, no change in status, stable for surgery.  I have reviewed the patient's chart and labs.  Questions were answered to the patient's satisfaction.     Gilles Trimpe

## 2023-12-25 NOTE — Transfer of Care (Signed)
 Immediate Anesthesia Transfer of Care Note  Patient: Dakota Ellison [facility]  Procedure(s) Performed: DRUG INDUCED SLEEP ENDOSCOPY (Nose)  Patient Location: PACU  Anesthesia Type:MAC  Level of Consciousness: awake, alert , and oriented  Airway & Oxygen Therapy: Patient Spontanous Breathing and Patient connected to nasal cannula oxygen  Post-op Assessment: Report given to RN and Post -op Vital signs reviewed and stable  Post vital signs: Reviewed and stable  Last Vitals:  Vitals Value Taken Time  BP 126/84 12/25/23 12:09  Temp    Pulse 74 12/25/23 12:10  Resp    SpO2 95 % 12/25/23 12:10  Vitals shown include unfiled device data.  Last Pain:  Vitals:   12/25/23 1051  TempSrc: Temporal  PainSc: 0-No pain      Patients Stated Pain Goal: 3 (12/25/23 1051)  Complications: No notable events documented.

## 2023-12-25 NOTE — Op Note (Signed)
 Operative note  Preoperative diagnosis: OSA Postoperative diagnosis:   Same  Procedure:DISE ( Drug induced sleep endoscopy)  Anesthesia: Topical lidocaine  gel Complications: None Condition is stable throughout exam  Indications and consent:   The patient presents to my otolaryngology clinic with a chief complaint of OSA  Because of pt's OSA and desire to be a candidate for Inspire therapy, it was recommended that they undergo a flexible fiberoptic laryngoscopy under sedation (DISE).   All the risks, benefits, and potential complications were reviewed with the patient preoperatively and informed consent was obtained.  Procedure: Pt was brought back to the OR and laid supine on the table. Propofol  anesthesia was administered per protocol until pt reached optimal level of sedation. DISE exam showed the following anatomic collapse pattern.  VOTE classification Velopharynx- 2 A-P with lateral pillar collapse Oropharynx- 2 Tongue base- 1 Epiglottis- 1  There was no evidence of complete concentric collapse at the soft palate   Based on the DISE findings, pt is a candidate for Hypoglossal nerve stimulation (Inspire therapy) based on the above anatomy.

## 2023-12-25 NOTE — Discharge Instructions (Addendum)
 DRUG-INDUCED SLEEP ENDOSCOPY POST-OPERATIVE INSTRUCTIONS:  Based on the drug-induced sleep endoscopy today, you were deemed a candidate for Inspire Therapy.  Please review post-operative and recovery instructions below that you will need to be aware of after Inspire Implant surgery.  Please restart all of your home medications if you take anything on a daily basis.  You can resume regular diet after this procedure.  You will be scheduled for pre-operative appointment with Dr. Irene Pap to review details about surgery and to discuss the next steps.   DIET: Resume normal diet HYGIENE: Please wait until 48 hours after surgery before getting incisions on neck, chest, and torso wet. In the first 48 hours after surgery, will likely need to take sponge baths. WOUND CARE: Please leave pressure dressing on for 48 hours after surgery. Gently place antibiotic ointment over incisions 2 times per day; use clean q-tip. May place a clean bandage over incisions as needed. After 48 hours, you may get incisions wet with warm soap and water, but do not soak the incisions.  Pat area dry gently.  Immediately place antibiotic ointment. Take oral antibiotics as prescribed If skin around incision starts to get red (> 1cm), swollen, and/or more painful, please call the office ACTIVITY: Try to avoid sleeping on the side of your surgery, to the extent possible.   You may walk for exercise starting the day after surgery. For 2 weeks: Do not pick up anything greater than 5 pounds with the hand/arm that's on the same side as the surgery.  After 2 weeks, you may increase weight to 10 pounds.   Consider performing neck rolls 10 clockwise and 10 counterclockwise 3x/day. For 4 weeks, no strenuous activity (running, jogging, lifting weights, gardening, sports) or until cleared by physician.   PAIN MEDICATIONS: You will be prescribed xxx for pain.   If pain is not severe, consider taking Tylenol 650mg  every 6 hours Avoid  aspirin for 7 days after surgery Avoid direct heat (such as heating pads) to incision sites.   May gently place ice over surgery sites as needed.  Please place a thin clean towel over skin first and then place ice bag over towel.  Ice for 10 minutes at a time only.  POST-OPERATIVE CLINIC APPOINTMENTS: 1 week: suture removal and wound check in the office.  1 month: device activation and wound check in the office. 2.5 months: check in visit to assess usage. 3-4 months: titration sleep study based on usage of >4 hrs/night.  4 months: final wound check in the office.  Yearly: device check at office.  SCAR CARE: After incisions have healed, you will have a scar, which will continue to evolve over the course of 12 months.  Caring for your incision scars will help them to be as minimal as possible. If you are out in the sun with incision exposure, please remember to place sunscreen over the incision and surrounding skin.   You may use vitamin E or "Scar ointment/cream" to help soften scar.  Please wait one month after surgery before starting this.      Post Anesthesia Home Care Instructions  Activity: Get plenty of rest for the remainder of the day. A responsible individual must stay with you for 24 hours following the procedure.  For the next 24 hours, DO NOT: -Drive a car -Advertising copywriter -Drink alcoholic beverages -Take any medication unless instructed by your physician -Make any legal decisions or sign important papers.  Meals: Start with liquid foods such as gelatin or  soup. Progress to regular foods as tolerated. Avoid greasy, spicy, heavy foods. If nausea and/or vomiting occur, drink only clear liquids until the nausea and/or vomiting subsides. Call your physician if vomiting continues.  Special Instructions/Symptoms: Your throat may feel dry or sore from the anesthesia or the breathing tube placed in your throat during surgery. If this causes discomfort, gargle with warm salt  water. The discomfort should disappear within 24 hours.  If you had a scopolamine patch placed behind your ear for the management of post- operative nausea and/or vomiting:  1. The medication in the patch is effective for 72 hours, after which it should be removed.  Wrap patch in a tissue and discard in the trash. Wash hands thoroughly with soap and water. 2. You may remove the patch earlier than 72 hours if you experience unpleasant side effects which may include dry mouth, dizziness or visual disturbances. 3. Avoid touching the patch. Wash your hands with soap and water after contact with the patch.

## 2023-12-25 NOTE — Anesthesia Postprocedure Evaluation (Signed)
 Anesthesia Post Note  Patient: Dakota Ellison  Procedure(s) Performed: DRUG INDUCED SLEEP ENDOSCOPY (Nose)     Patient location during evaluation: PACU Anesthesia Type: MAC Level of consciousness: awake Pain management: pain level controlled Vital Signs Assessment: post-procedure vital signs reviewed and stable Respiratory status: spontaneous breathing, nonlabored ventilation and respiratory function stable Cardiovascular status: stable and blood pressure returned to baseline Postop Assessment: no apparent nausea or vomiting Anesthetic complications: no   No notable events documented.  Last Vitals:  Vitals:   12/25/23 1231 12/25/23 1240  BP: 126/79 126/79  Pulse: 68   Resp:  18  Temp:    SpO2: 96% 96%    Last Pain:  Vitals:   12/25/23 1240  TempSrc:   PainSc: 0-No pain                 Delon Aisha Arch

## 2023-12-26 ENCOUNTER — Encounter (HOSPITAL_BASED_OUTPATIENT_CLINIC_OR_DEPARTMENT_OTHER): Payer: Self-pay | Admitting: Otolaryngology

## 2024-01-01 ENCOUNTER — Telehealth (INDEPENDENT_AMBULATORY_CARE_PROVIDER_SITE_OTHER): Payer: Self-pay

## 2024-01-01 NOTE — Telephone Encounter (Signed)
 Patient LVM concerned about if he passed the airway exam for INSPIRE that he had by nasal endoscopy in office last week. Patient also got notice from his insurance stating that there was a coding error in the codes that were used for his appointment. ([employer] [place]) Please advise.

## 2024-01-02 ENCOUNTER — Telehealth (INDEPENDENT_AMBULATORY_CARE_PROVIDER_SITE_OTHER): Payer: Self-pay

## 2024-01-02 NOTE — Telephone Encounter (Signed)
 Left a message for the patient to give the billing department a call. I left their number and our number if he wanted to give us  a call back.

## 2024-01-04 ENCOUNTER — Other Ambulatory Visit: Payer: Self-pay | Admitting: Internal Medicine

## 2024-01-04 NOTE — Telephone Encounter (Signed)
 Name of Medication: Hydrocodone  apap 5-325 mg Name of Pharmacy: [facility] Last Fill or Written Date and Quantity: 12-06-23 # 120 Last Office Visit and Type: 11-20-23 Next Office Visit and Type: 02-12-24 Last Controlled Substance Agreement Date: 05-15-23 Last UDS: 05-15-23   Alprazolam  last filled 12-06-23 #90

## 2024-01-23 ENCOUNTER — Encounter (HOSPITAL_BASED_OUTPATIENT_CLINIC_OR_DEPARTMENT_OTHER): Admitting: Internal Medicine

## 2024-01-25 DIAGNOSIS — R2241 Localized swelling, mass and lump, right lower limb: Secondary | ICD-10-CM | POA: Diagnosis not present

## 2024-01-25 DIAGNOSIS — S81831A Puncture wound without foreign body, right lower leg, initial encounter: Secondary | ICD-10-CM | POA: Diagnosis not present

## 2024-01-26 ENCOUNTER — Other Ambulatory Visit: Payer: Self-pay | Admitting: Internal Medicine

## 2024-01-26 DIAGNOSIS — M48061 Spinal stenosis, lumbar region without neurogenic claudication: Secondary | ICD-10-CM | POA: Diagnosis not present

## 2024-01-28 ENCOUNTER — Ambulatory Visit (HOSPITAL_BASED_OUTPATIENT_CLINIC_OR_DEPARTMENT_OTHER): Attending: Internal Medicine | Admitting: Internal Medicine

## 2024-01-28 DIAGNOSIS — G4733 Obstructive sleep apnea (adult) (pediatric): Secondary | ICD-10-CM | POA: Insufficient documentation

## 2024-01-29 DIAGNOSIS — S81831D Puncture wound without foreign body, right lower leg, subsequent encounter: Secondary | ICD-10-CM | POA: Diagnosis not present

## 2024-01-29 DIAGNOSIS — K080 Exfoliation of teeth due to systemic causes: Secondary | ICD-10-CM | POA: Diagnosis not present

## 2024-01-29 DIAGNOSIS — R2241 Localized swelling, mass and lump, right lower limb: Secondary | ICD-10-CM | POA: Diagnosis not present

## 2024-02-01 DIAGNOSIS — K080 Exfoliation of teeth due to systemic causes: Secondary | ICD-10-CM | POA: Diagnosis not present

## 2024-02-04 NOTE — Procedures (Signed)
  Indications for Polysomnography The patient is a 73 year old Male who is 6' and weighs 270.0 lbs. His BMI equals 37.0.  A full night polysomnogram was performed to evaluate for -.  No medication was taken.No Data. Polysomnogram Data A full night polysomnogram recorded the standard physiologic parameters including EEG, EOG, EMG, EKG, nasal and oral airflow.  Respiratory parameters of chest and abdominal movements were recorded with Respiratory Inductance Plethysmography belts.   Oxygen saturation was recorded by pulse oximetry.  Sleep Architecture The total recording time of the polysomnogram was 362.4 minutes.  The total sleep time was 193.0 minutes.  The patient spent 6.7% of total sleep time in Stage N1, 67.4% in Stage N2, 0.0% in Stages N3, and 25.9% in REM.  Sleep latency was 44.9 minutes.   REM latency was 119.0 minutes.  Sleep Efficiency was 53.3%.  Wake after Sleep Onset time was 124.0 minutes.  Respiratory Events The polysomnogram revealed a presence of 78 obstructive, - central, and - mixed apneas resulting in an Apnea index of 24.2 events per hour.  There were 71 hypopneas (GreaterEqual to3% desaturation and/or arousal) resulting in an Apnea\Hypopnea Index (AHI  GreaterEqual to3% desaturation and/or arousal) of 46.3 events per hour.  There were 35 hypopneas (GreaterEqual to4% desaturation) resulting in an Apnea\Hypopnea Index (AHI GreaterEqual to4% desaturation) of 35.1 events per hour.  There were 21  Respiratory Effort Related Arousals resulting in a RERA index of 6.5 events per hour. The Respiratory Disturbance Index is 52.8 events per hour.  The snore index was - events per hour.  Mean oxygen saturation was 94.2%.  The lowest oxygen saturation during sleep was 80.0%.  Time spent LessEqual to88% oxygen saturation was  minutes ().  Limb Activity There were 25 total limb movements recorded, of this total, 19 were classified as PLMs.  PLM index was 5.9 per hour and PLM associated with  Arousals index was 0.3 per hour.  Cardiac Summary The average pulse rate was 66.0 bpm.  The minimum pulse rate was 45.0 bpm while the maximum pulse rate was 98.0 bpm.  Cardiac rhythm was normal/abnormal.  Comments:  Diagnosis:  Recommendations:   This study was personally reviewed and electronically signed by: Neysa Rama, MD Accredited Board Certified in Sleep Medicine Date/Time:

## 2024-02-04 NOTE — Procedures (Signed)
 Dakota Ellison [facility] [address] [place], [place] [address] Tel: [phone]   Fax: [phone]  Polysomnography Interpretation  Patient Name:  Dakota Ellison, Dakota Ellison Date:  01/28/2024 Referring Physician:  REGGY SALT [phone]) %%startinterp%% Indications for Polysomnography The patient is a 73 year old Male who is 6' and weighs 270.0 lbs. His BMI equals 37.0.  A full night polysomnogram was performed to evaluate for -.OSA  No medication was taken.  No Data.   Polysomnogram Data A full night polysomnogram recorded the standard physiologic parameters including EEG, EOG, EMG, EKG, nasal and oral airflow.  Respiratory parameters of chest and abdominal movements were recorded with Respiratory Inductance Plethysmography belts.  Oxygen saturation was recorded by pulse oximetry.   Sleep Architecture The total recording time of the polysomnogram was 362.4 minutes.  The total sleep time was 193.0 minutes.  The patient spent 6.7% of total sleep time in Stage N1, 67.4% in Stage N2, 0.0% in Stages N3, and 25.9% in REM.  Sleep latency was 44.9 minutes.  REM latency was 119.0 minutes.  Sleep Efficiency was 53.3%.  Wake after Sleep Onset time was 124.0 minutes.  Respiratory Events The polysomnogram revealed a presence of 78 obstructive, - central, and - mixed apneas resulting in an Apnea index of 24.2 events per hour.  There were 71 hypopneas (>=3% desaturation and/or arousal) resulting in an Apnea\Hypopnea Index (AHI >=3% desaturation and/or arousal) of 46.3 events per hour.  There were 35 hypopneas (>=4% desaturation) resulting in an Apnea\Hypopnea Index (AHI >=4% desaturation) of 35.1 events per hour.  There were 21 Respiratory Effort Related Arousals resulting in a RERA index of 6.5 events per hour. The Respiratory Disturbance Index is 52.8 events per hour.  The snore index was - events per hour.  Mean oxygen saturation was 94.2%.  The lowest oxygen saturation  during sleep was 80.0%.  Time spent <=88% oxygen saturation was 5.5 minutes (1.6%).  Limb Activity There were 25 total limb movements recorded, of this total, 19 were classified as PLMs.  PLM index was 5.9 per hour and PLM associated with Arousals index was 0.3 per hour.  Cardiac Summary The average pulse rate was 66.0 bpm.  The minimum pulse rate was 45.0 bpm while the maximum pulse rate was 98.0 bpm.  Cardiac rhythm was normal.  Comments: Severe obstructive sleep apnea, AHI (4%) 35.1./hr. Snoring with oxygen desaturation to a nadir of 80%, mean 94.2%.  Diagnosis: Obstructive sleep apnea  Recommendations: Suggest CPAP or ENT surgical evaluation   This study was personally reviewed and electronically signed by: SALT REGGY, MD Accredited Board Certified in Sleep Medicine Date/Time: 02/04/24   12:45    %%endinterp%%   Diagnostic PSG Report  Patient Name: Dakota Ellison Date: 01/28/2024  Date of Birth: [birth year] Study Type: Diagnostic  Age: 22 year MRN #: [id number]  Sex: Male Interpreting Physician: SALT REGGY, [id number]  Height: 6' Referring Physician: REGGY SALT [id number]  Weight: 270.0 lbs Recording Tech: Hargis Abu RPSGT RST  BMI: 37.0 Scoring Tech: Hargis Abu RPSGT RST  ESS: 4 Neck Size: 19   Study Overview  Lights Off: 09:34:26 PM  Count Index  Lights On: 03:36:47 AM Awakenings: 19 5.9  Time in Bed: 362.4 min. Arousals: 130 40.4  Total Sleep Time: 193.0 min. AHI (>=3% Desat and/or Ar.): 149 46.3   Sleep Efficiency: 53.3% AHI (>=4% Desat): 113 35.1   Sleep Latency: 44.9 min. Limb Movements: 25 7.8  Wake After Sleep Onset: 124.0 min.  Snore: - -  REM Latency from Sleep Onset: 119.0 min. Desaturations: 147 45.7     Minimum SpO2 TST: 80.0%    Sleep Architecture  % of Time in Bed Stages Time (mins) % Sleep Time  Wake 169.5   Stage N1 13.0 6.7%  Stage N2 130.0 67.4%  Stage N3 0.0 0.0%  REM 50.0 25.9%   Arousal Summary   NREM REM Sleep Index  Respiratory  Arousals 89 20 109 33.9  PLM Arousals 1 - 1 0.3  Isolated Limb Movement Arousals - - - -  Snore Arousals - - - -  Spontaneous Arousals 17 3 20  6.2  Total 107 23 130 40.4   Limb Movement Summary   Count Index  Isolated Limb Movements 6 1.9  Periodic Limb Movements (PLMs) 19 5.9  Total Limb Movements 25 7.8    Respiratory Summary   By Sleep Stage By Body Position Total   NREM REM Supine Non-Supine   Time (min) 143.0 50.0 193.0 - 193.0         Obstructive Apnea 60 18 78 - 78  Mixed Apnea - - - - -  Central Apnea - - - - -  Total Apneas 60 18 78 - 78  Total Apnea Index 25.2 21.6 24.2 - 24.2         Hypopneas (>=3% Desat and/or Ar.) 54 17 71 - 71  AHI (>=3% Desat and/or Ar.) 47.8 42.0 46.3 - 46.3         Hypopneas (>=4% Desat) 27 8 35 - 35  AHI (>=4% Desat) 36.5 31.2 35.1 - 35.1          RERAs 18 3 21  - 21  RERA Index 7.6 3.6 6.5 - 6.5         RDI 55.4 45.6 52.8 - 52.8    Respiratory Event Type Index  Central Apneas -  Obstructive Apneas 24.2  Mixed Apneas -  Central Hypopneas -  Obstructive Hypopneas -  Central Apnea + Hypopnea (CAHI) -  Obstructive Apnea + Hypopnea (OAHI) 51.3   Respiratory Event Durations   Apnea Hypopnea   NREM REM NREM REM  Average (seconds) 24.8 23.1 27.2 22.8  Maximum (seconds) 50.7 56.8 54.0 33.7    Oxygen Saturation Summary   Wake NREM REM TST TIB  Average SpO2 (%) 94.3% 94.1% 94.2% 94.1% 94.2%  Minimum SpO2 (%) 82.0% 80.0% 80.0% 80.0% 80.0%  Maximum SpO2 (%) 98.0% 98.0% 98.0% 98.0% 98.0%   Oxygen Saturation Distribution  Range (%) Time in range (min) Time in range (%)  90.0 - 100.0 335.9 96.8%  80.0 - 90.0 10.9 3.1%  70.0 - 80.0 0.1 0.0%  60.0 - 70.0 - -  50.0 - 60.0 - -  0.0 - 50.0 - -  Time Spent <=88% SpO2  Range (%) Time in range (min) Time in range (%)  0.0 - 88.0 5.5 1.6%      Count Index  Desaturations 147 45.7    Cardiac Summary   Wake NREM REM Sleep Total  Average Pulse Rate (BPM) 69.6 63.5 61.8 63.1  66.0  Minimum Pulse Rate (BPM) 45.0 47.0 49.0 47.0 45.0  Maximum Pulse Rate (BPM) 98.0 83.0 84.0 84.0 98.0   Pulse Rate Distribution:  Range (bpm) Time in range (min) Time in range (%)  0.0 - 40.0 - -  40.0 - 60.0 71.4 20.6%  60.0 - 80.0 267.6 77.1%  80.0 - 100.0 8.0 2.3%  100.0 - 120.0 - -  120.0 - 140.0 - -  140.0 - 200.0 - -      Hypnograms                      Technologist Comments  Patient was at the Sleep Lab for OSA. A PSG Study was ordered.  Respiratory events were noted.  Snoring was mild to loud.  Periodic Limb Movement was rare.  EKG showed NSR. No medication was taken.  No oxygen was applied.                        Reggy Salt Diplomate, Biomedical engineer of Sleep Medicine  ELECTRONICALLY SIGNED ON:  02/04/2024, 12:41 PM [facility] PH: [phone]   FX: [phone] ACCREDITED BY THE AMERICAN ACADEMY OF SLEEP MEDICINE

## 2024-02-06 ENCOUNTER — Other Ambulatory Visit: Payer: Self-pay | Admitting: Internal Medicine

## 2024-02-06 NOTE — Telephone Encounter (Signed)
 Name of Medication: Hydrocodone  apap 5-325 mg Name of Pharmacy: [facility] Last Fill or Written Date and Quantity: 01-04-24 # 120 Last Office Visit and Type: 11-20-23 Next Office Visit and Type: 02-12-24 Last Controlled Substance Agreement Date: 05-15-23 Last UDS: 05-15-23   Alprazolam  last filled 01-04-24 #90

## 2024-02-07 ENCOUNTER — Telehealth: Payer: Self-pay | Admitting: Internal Medicine

## 2024-02-07 NOTE — Telephone Encounter (Signed)
 Left message on VM per DPR. Advised him Padonda will refill it soon. She has a 48-72 hour window to have it done.

## 2024-02-07 NOTE — Telephone Encounter (Signed)
 Copied from CRM [id number]. Topic: Clinical - Medication Question >> Feb 07, 2024  2:15 PM Gennette ORN wrote: Reason for CRM: Patient is calling in to be prescribed the medication  ALPRAZolam  (XANAX ) 0.5 MG tablet and HYDROcodone -acetaminophen  (NORCO/VICODIN) 5-325 MG tablet he is following up to see when can he get this medication. Please call patient back he is out.

## 2024-02-09 ENCOUNTER — Encounter (INDEPENDENT_AMBULATORY_CARE_PROVIDER_SITE_OTHER): Payer: Self-pay | Admitting: Otolaryngology

## 2024-02-09 ENCOUNTER — Ambulatory Visit (INDEPENDENT_AMBULATORY_CARE_PROVIDER_SITE_OTHER): Admitting: Otolaryngology

## 2024-02-09 ENCOUNTER — Telehealth: Payer: Self-pay | Admitting: Internal Medicine

## 2024-02-09 VITALS — BP 151/80 | HR 75

## 2024-02-09 DIAGNOSIS — J3089 Other allergic rhinitis: Secondary | ICD-10-CM

## 2024-02-09 DIAGNOSIS — M26621 Arthralgia of right temporomandibular joint: Secondary | ICD-10-CM

## 2024-02-09 DIAGNOSIS — H6990 Unspecified Eustachian tube disorder, unspecified ear: Secondary | ICD-10-CM | POA: Diagnosis not present

## 2024-02-09 DIAGNOSIS — G4733 Obstructive sleep apnea (adult) (pediatric): Secondary | ICD-10-CM | POA: Diagnosis not present

## 2024-02-09 DIAGNOSIS — Z789 Other specified health status: Secondary | ICD-10-CM

## 2024-02-09 DIAGNOSIS — S81831D Puncture wound without foreign body, right lower leg, subsequent encounter: Secondary | ICD-10-CM | POA: Diagnosis not present

## 2024-02-09 DIAGNOSIS — H6993 Unspecified Eustachian tube disorder, bilateral: Secondary | ICD-10-CM

## 2024-02-09 MED ORDER — LEVOCETIRIZINE DIHYDROCHLORIDE 5 MG PO TABS: 5.0000 mg | ORAL_TABLET | Freq: Every evening | ORAL | 3 refills | Status: AC

## 2024-02-09 MED ORDER — FLUTICASONE PROPIONATE 50 MCG/ACT NA SUSP: 2.0000 | Freq: Two times a day (BID) | NASAL | 6 refills | Status: AC

## 2024-02-09 NOTE — Telephone Encounter (Signed)
 Spoke to pt. Advised him that the NP helping with refills will only do a 30 day supply as a bridge until he sees a new provider. He is scheduled to see Adina Crandall next week.  I advised him to discuss his controlled meds then. He also asked about it taking a few days for refills. I advised him that not every provider was like Dr Jimmy in getting refills done same day or 24 hours. But, they have 72 hours to get them done.

## 2024-02-09 NOTE — Telephone Encounter (Signed)
 Copied from CRM #[id number]. Topic: Clinical - Prescription Issue >> Feb 09, 2024  1:21 PM Pinkey ORN wrote: Reason for CRM: ALPRAZolam  (XANAX ) 0.5 MG tablet >> Feb 09, 2024  1:22 PM Pinkey ORN wrote: Patient states he's supposed to have a 90 day supply, but when he picked it up from the pharmacy it was only 30.SABRA Please correct and follow up.

## 2024-02-09 NOTE — Progress Notes (Signed)
 Patient took BP medication not too long ago. Medication has most likely not kicked in.

## 2024-02-09 NOTE — Progress Notes (Signed)
 ENT Progress Note:   Update 02/09/2024  Discussed the use of AI scribe software for clinical note transcription with the patient, who gave verbal consent to proceed.  History of Present Illness Dakota Ellison is a 73 year old male with obstructive sleep apnea who presents for evaluation of Inspire therapy candidacy. He was referred by Dr. Neysa for evaluation of Inspire therapy as an alternative to CPAP.  He has a history of obstructive sleep apnea and has been using CPAP therapy, which is no longer effective as it is 'maxed out'. A recent sleep study qualifies him for [facility] therapy.Dakota Ellison He underwent a sleep endoscopy and it did not show any evidence of complete concentric collapse a the soft palate.   He experiences symptoms related to temporomandibular joint dysfunction, describing a sensation of the jaw 'coming out of the socket' and pressure in his ears. He experiences frequent ear popping. He is currently not on any medication for these symptoms but is interested in starting treatment with a nasal spray and allergy pill to manage the ear pressure and popping.   Records Reviewed:  Initial Evaluation  Reason for Consult: OSA CPAP intolerance    HPI: Discussed the use of AI scribe software for clinical note transcription with the patient, who gave verbal consent to proceed.  History of Present Illness Dakota Ellison is a 73 year old male with obstructive sleep apnea who presents for evaluation of alternative treatments to CPAP therapy. He was referred by Dr. Neysa for evaluation of alternative treatments for obstructive sleep apnea.  He has a history of obstructive sleep apnea, initially diagnosed in 2010, AHI of 82, no centra mixed apneas, and has been using CPAP therapy since then. He experiences discomfort with the CPAP mask, describing it as uncomfortable and difficult to tolerate. He is interested in exploring alternative treatments, specifically the [facility] implant, and mentions that his  CPAP settings are maxed out. He has not undergone a recent sleep study since the initial one in 2010.  His body mass index (BMI) is currently 36, which may affect insurance preapproval for the [facility] implant. 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   He has a past medical history of atrial fibrillation, which occurred two years ago, and he is currently on Eliquis , prescribed by his cardiologist, Dr. Gollan.   Dakota Ellison is a 73 year old male with obstructive sleep apnea who presents for evaluation of Inspire therapy candidacy. He was referred by Dr. Neysa for evaluation of Inspire therapy as an alternative to CPAP.  He has a history of obstructive sleep apnea and has been using CPAP therapy, which is no longer effective as it is 'maxed out'. A recent sleep study qualifies him for [facility] therapy, but his weight may be a factor in insurance approval. He underwent a sleep endoscopy two years ago.  He inquires about the strength of Inspire therapy compared to CPAP and BiPAP, expressing concern after reading that Elizabeth is 'no stronger than a CPAP.' Despite this, he is interested in proceeding with Inspire therapy.  He experiences symptoms related to temporomandibular joint dysfunction, describing a sensation of the jaw 'coming out of the socket' and pressure in his ears. He experiences frequent ear popping. He is currently not on any medication for these symptoms but is interested in starting treatment with a nasal spray and allergy pill to manage the ear pressure and popping.    Records Reviewed:  Office visit with Dr Neysa 10/07/20  male Never smoker followed for OSA complicated by morbid obesity, HBP, asthma, GERD NPSG 2010, AHI 82/ hr CPAP to 20   Past Medical History:  Diagnosis Date   Anxiety state 04/08/2014   denies   Chronic back pain    Complication of anesthesia    and trouble waking up   Degenerative disc  disease    Dysthymia    denies   ED (erectile dysfunction)    GERD (gastroesophageal reflux disease)    History of MRSA infection    on lip from cut   Hyperlipemia    Hypertension    take BP med for small leak in heart to keep it small per pt.   Mild asthma    as a teen   Morbid obesity (HCC)    OSA (obstructive sleep apnea)    Osteoarthritis    PAF (paroxysmal atrial fibrillation) (HCC) 2023   PONV (postoperative nausea and vomiting)    Rectal fissure    Testosterone  deficiency     Past Surgical History:  Procedure Laterality Date   DRUG INDUCED ENDOSCOPY N/A 12/25/2023   Procedure: DRUG INDUCED SLEEP ENDOSCOPY;  Surgeon: Okey Burns, MD;  Location: [facility];  Service: ENT;  Laterality: N/A;   HERNIA REPAIR     1998   KNEE SURGERY  09/2010   left knee and right knee arthroscopy   OTHER SURGICAL HISTORY     arthroscopic subacromial decompression   OTHER SURGICAL HISTORY     open resection, distal right clavicle   SHOULDER ARTHROSCOPY     right   SPHINCTEROTOMY     TOTAL KNEE ARTHROPLASTY Left 06/11/2018   Procedure: LEFT TOTAL KNEE ARTHROPLASTY;  Surgeon: Melodi Lerner, MD;  Location: [facility];  Service: Orthopedics;  Laterality: Left;    TOTAL KNEE ARTHROPLASTY Right 12/17/2018   Procedure: TOTAL KNEE ARTHROPLASTY;  Surgeon: Melodi Lerner, MD;  Location: [facility];  Service: Orthopedics;  Laterality: Right;     Family History  Problem Relation Age of Onset   Pneumonia Brother     Social History:  reports that he quit smoking about 12 years ago. His smoking use included cigars. He has been exposed to tobacco smoke. He has never used smokeless tobacco. He reports that he does not currently use alcohol . He reports that he does not use drugs.  Allergies:  Allergies  Allergen Reactions   Atorvastatin  Other (See Comments)    Muscle weakness in his legs   Crestor  [Rosuvastatin ] Other (See Comments)    Severe muscle cramping   Zocor  [Simvastatin] Other (See Comments)    Muscle weakness in his legs   Atorvastatin  Calcium  Other (See Comments)    Medications: I have reviewed the patient's current medications.  The PMH, PSH, Medications, Allergies, and SH were reviewed and updated.  ROS: Constitutional: Negative for fever, weight loss and weight gain. Cardiovascular: Negative for chest pain and dyspnea on exertion. Respiratory: Is not experiencing shortness of breath at rest. Gastrointestinal: Negative for nausea and vomiting. Neurological: Negative for headaches. Psychiatric: The patient is not nervous/anxious  Blood pressure (!) 151/80, pulse 75, SpO2 92%. There is no height or weight on file to calculate BMI.  PHYSICAL EXAM:  Exam: General: Well-developed, well-nourished Respiratory Respiratory effort: Equal inspiration and expiration without stridor Cardiovascular Peripheral Vascular: Warm extremities with equal color/perfusion Eyes: No nystagmus with equal extraocular motion bilaterally Neuro/Psych/Balance: Patient oriented to person, place, and time; Appropriate mood and affect;  Gait is intact with no imbalance; Cranial nerves I-XII are intact Head and Face Inspection: Normocephalic and atraumatic without mass or lesion Palpation: Facial skeleton intact without bony stepoffs Salivary Glands: No mass or tenderness  Facial Strength: Facial motility symmetric and full bilaterally ENT Pinna: External ear intact and fully developed External canal: Canal is patent with intact skin Tympanic Membrane: Clear and mobile External Nose: No scar or anatomic deformity Internal Nose: Septum is relatively straight on anterior rhinoscopy. Bilateral inferior turbinate hypertrophy.  Lips, Teeth, and gums: Mucosa and teeth intact and viable Oral cavity/oropharynx: No erythema or exudate, no lesions present Neck Neck and Trachea: Midline trachea without mass or lesion Thyroid : No mass or nodularity Lymphatics: No  lymphadenopathy   Studies Reviewed: Date of study 09/24/2008 Split night sleep study AHI of 82  No central or mixed events   Sleep Study 01/28/24 Respiratory Events The polysomnogram revealed a presence of 78 obstructive, - central, and - mixed apneas resulting in an Apnea index of 24.2 events per hour.  There were 71 hypopneas (>=3% desaturation and/or arousal) resulting in an Apnea\Hypopnea Index (AHI >=3% desaturation and/or arousal) of 46.3 events per hour.  There were 35 hypopneas (>=4% desaturation) resulting in an Apnea\Hypopnea Index (AHI >=4% desaturation) of 35.1 events per hour.  There were 21 Respiratory Effort Related Arousals resulting in a RERA index of 6.5 events per hour. The Respiratory Disturbance Index is 52.8 events per hour.  The snore index was - events per hour.   Mean oxygen saturation was 94.2%.  The lowest oxygen saturation during sleep was 80.0%.  Time spent <=88% oxygen saturation was 5.5 minutes (1.6%).   Assessment/Plan: Encounter Diagnoses  Name Primary?   OSA (obstructive sleep apnea) Yes   Intolerance of continuous positive airway pressure (CPAP) ventilation [Z78.9]    Environmental and seasonal allergies    Dysfunction of both eustachian tubes    Arthralgia of right temporomandibular joint     Assessment and Plan Assessment & Plan Obstructive Sleep Apnea Chronic obstructive sleep apnea managed with CPAP. Interested in [facility] implant due to CPAP discomfort. BMI of 38 may challenge insurance preapproval. Recent sleep study and drug-induced sleep endoscopy required for implant consideration. Sleep study in 2010 with AHI of 82 no mixed or central events.  - Order new sleep study (established with Dr Neysa) - Order drug-induced sleep endoscopy to rule out [facility] - will schedule - Instructed him to contact Doctor Young's office to schedule the sleep study.   Body Mass Index (BMI) 38 BMI of 38 may affect insurance preapproval for [facility] implant. Discussed  potential need for weight management program if BMI is a barrier to insurance pre-approval  - Advise him to be mindful of diet and exercise. - Consider referral to weight loss management program if BMI remains a barrier for Inspire implant.  Atrial Fibrillation Atrial fibrillation managed with Eliquis . Cardiology clearance needed for Inspire implant if pursued. - Ensure cardiology clearance for Inspire implant if pursued.  Update 02/09/24 Obstructive sleep apnea moderate Recent repeat sleep study with AHI of 35 and no central/mixed events.  Inspire hypoglossal nerve stimulator candidacy confirmed with DISE as he did not show complete concentric collapse. 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 - Educated on [place] procedure, incisions, recovery, and activation.  Eustachian tube dysfunction Frequent ear pressure due to eustachian tube dysfunction. Ability to pop ears indicates pressure equilibration. - Prescribed Flonase  nasal spray twice daily. 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

## 2024-02-09 NOTE — Patient Instructions (Signed)

## 2024-02-12 ENCOUNTER — Encounter: Payer: Self-pay | Admitting: Nurse Practitioner

## 2024-02-12 ENCOUNTER — Ambulatory Visit (INDEPENDENT_AMBULATORY_CARE_PROVIDER_SITE_OTHER): Admitting: Nurse Practitioner

## 2024-02-12 VITALS — BP 130/78 | HR 74 | Temp 98.4°F | Ht 71.0 in | Wt 290.8 lb

## 2024-02-12 DIAGNOSIS — E349 Endocrine disorder, unspecified: Secondary | ICD-10-CM

## 2024-02-12 DIAGNOSIS — I480 Paroxysmal atrial fibrillation: Secondary | ICD-10-CM | POA: Diagnosis not present

## 2024-02-12 DIAGNOSIS — M48061 Spinal stenosis, lumbar region without neurogenic claudication: Secondary | ICD-10-CM | POA: Diagnosis not present

## 2024-02-12 DIAGNOSIS — M48062 Spinal stenosis, lumbar region with neurogenic claudication: Secondary | ICD-10-CM

## 2024-02-12 DIAGNOSIS — Z23 Encounter for immunization: Secondary | ICD-10-CM | POA: Diagnosis not present

## 2024-02-12 DIAGNOSIS — F1320 Sedative, hypnotic or anxiolytic dependence, uncomplicated: Secondary | ICD-10-CM

## 2024-02-12 DIAGNOSIS — G4733 Obstructive sleep apnea (adult) (pediatric): Secondary | ICD-10-CM

## 2024-02-12 DIAGNOSIS — I10 Essential (primary) hypertension: Secondary | ICD-10-CM | POA: Diagnosis not present

## 2024-02-12 DIAGNOSIS — K219 Gastro-esophageal reflux disease without esophagitis: Secondary | ICD-10-CM

## 2024-02-12 DIAGNOSIS — F1120 Opioid dependence, uncomplicated: Secondary | ICD-10-CM

## 2024-02-12 NOTE — Assessment & Plan Note (Signed)
 Currently maintained on metoprolol  50 mg daily and valsartan -hydrochlorothiazide  320 mg - 25 mg daily.  Blood pressure controlled continue medication as prescribed

## 2024-02-12 NOTE — Telephone Encounter (Signed)
 Contacted [facility].  Pharmacist advised pt to contact them regarding the 30 day supply concern for xanax  medication.  Sent pt [employer].  Seen in office today 02/12/24

## 2024-02-12 NOTE — Assessment & Plan Note (Signed)
 Currently maintained on omeprazole  20 mg daily.  Continue

## 2024-02-12 NOTE — Progress Notes (Signed)
 Established Patient Office Visit  Subjective   Patient ID: Dakota Ellison, male    DOB: 1951-02-11  Age: 73 y.o. MRN: [id number]  Chief Complaint  Patient presents with   Transitions Of Care    Would like flu shot.    Medication Management    Pt complains he was supposed to received 90 day supply of xanax . Pt states he only received 30 days.     HPI  Gout: Currently maintained on allopurinol 100 mg twice daily  A-fib: Currently maintained on apixaban  5 mg twice daily along with metoprolol  50 mg daily.  Patient is followed by cardiology, Dr. Gollan  HLD: Currently maintained on ezetimibe  10 mg daily, omega-3 fatty acids.  Patient has tried and failed statins in the past  Chronic pain/spinal stenosis: Patient currently maintained on hydrocodone -acetaminophen  5-3 25 1  tablet every 6 hours as needed pain.  Last UDS was December 2024 that was appropriate. He is being seen by Dr. Raquel and getting injections. Does that every 3+ months   GERD: Currently maintained on omeprazole  20 mg daily  MDD: Currently maintained on sertraline  25 mg daily.  Denies HI/SI/AVH  Hypogonadism: Patient currently maintained on testosterone  200 mg IM every 14 days, followed by urology, Dr. Morene Salines  HTN: Patient currently maintained on valsartan -hydrochlorothiazide  320-25 mg daily, metoprolol  50 mg daily.  OSA: Patient currently undergoing workup for inspire implantable device.  Does not tolerate CPAP therapy followed by pulmonology and ENT  Colonoscopy: 06/17/2019, repeat 10 years PSA: Due  TDAP: 2022 Flu: Updated today Covid: original series and booster Pna: 2018, deferred until next office visit Shingles: completed     Review of Systems  Constitutional:  Negative for chills and fever.  Respiratory:  Negative for shortness of breath.   Cardiovascular:  Negative for chest pain and leg swelling.  Gastrointestinal:  Negative for abdominal pain, blood in stool, constipation, diarrhea,  nausea and vomiting.  Genitourinary:  Negative for dysuria and hematuria.  Musculoskeletal:  Positive for back pain.  Neurological:  Negative for tingling, tremors and headaches.  Psychiatric/Behavioral:  Negative for hallucinations and suicidal ideas.       Objective:     BP 130/78   Pulse 74   Temp 98.4 F (36.9 C) (Oral)   Ht 5' 11 (1.803 m)   Wt 290 lb 12.8 oz (131.9 kg)   SpO2 95%   BMI 40.56 kg/m  BP Readings from Last 3 Encounters:  02/12/24 130/78  02/09/24 (!) 151/80  12/25/23 126/79   Wt Readings from Last 3 Encounters:  02/12/24 290 lb 12.8 oz (131.9 kg)  01/28/24 270 lb (122.5 kg)  12/25/23 280 lb 13.9 oz (127.4 kg)   SpO2 Readings from Last 3 Encounters:  02/12/24 95%  02/09/24 92%  12/25/23 96%      Physical Exam Vitals and nursing note reviewed.  Constitutional:      Appearance: Normal appearance.  HENT:     Right Ear: Tympanic membrane, ear canal and external ear normal.     Left Ear: Tympanic membrane, ear canal and external ear normal.     Mouth/Throat:     Mouth: Mucous membranes are dry.     Pharynx: Oropharynx is clear.  Eyes:     Extraocular Movements: Extraocular movements intact.     Pupils: Pupils are equal, round, and reactive to light.  Cardiovascular:     Rate and Rhythm: Normal rate and regular rhythm.     Pulses: Normal pulses.  Heart sounds: Normal heart sounds.  Pulmonary:     Effort: Pulmonary effort is normal.     Breath sounds: Normal breath sounds.  Abdominal:     General: Bowel sounds are normal. There is no distension.     Palpations: There is no mass.     Tenderness: There is no abdominal tenderness.     Hernia: No hernia is present.  Musculoskeletal:     Cervical back: Signs of trauma present.     Lumbar back: Positive right straight leg raise test. Negative left straight leg raise test.     Right lower leg: No edema.     Left lower leg: No edema.  Lymphadenopathy:     Cervical: No cervical adenopathy.   Skin:    General: Skin is warm.  Neurological:     General: No focal deficit present.     Mental Status: He is alert.     Deep Tendon Reflexes:     Reflex Scores:      Patellar reflexes are 1+ on the right side and 1+ on the left side.    Comments: lower extremity strength 5/5  Psychiatric:        Mood and Affect: Mood normal.        Behavior: Behavior normal.        Thought Content: Thought content normal.        Judgment: Judgment normal.      No results found for any visits on 02/12/24.    The 10-year ASCVD risk score (Arnett DK, et al., 2019) is: 39.7%    Assessment & Plan:   Problem List Items Addressed This Visit       Cardiovascular and Mediastinum   Essential hypertension - Primary   Currently maintained on metoprolol  50 mg daily and valsartan -hydrochlorothiazide  320 mg - 25 mg daily.  Blood pressure controlled continue medication as prescribed      Atrial fibrillation (HCC)   Currently on apixaban  5 mg twice daily and metoprolol  50 mg daily.  Controlled.  Followed by cardiology continue following with cardiology continue taking medication as prescribed        Respiratory   OSA (obstructive sleep apnea)   History of the same follow-up with pulmonology.  Currently undergoing workup for inspire device        Digestive   GERD   Currently maintained on omeprazole  20 mg daily.  Continue        Other   Testosterone  deficiency   Currently followed by urology.  Continue taking testosterone  as prescribed follow-up with urology as recommended.      Lumbar spinal stenosis   History of same with chronic pain patient undergoing injections through orthopedist in stable hydrocodone .  Continue      Chronic narcotic dependence (HCC)   Maintained on hydrocodone .  PDMP reviewed no concerns reviewed most recent drug screen no concerns.  Continue medication as prescribed      Benzodiazepine dependence (HCC)   Currently maintained on alprazolam  0.5 mg 3 times  daily as needed.  PDMP reviewed.  Continue      Other Visit Diagnoses       Need for influenza vaccination       Relevant Orders   Flu vaccine HIGH DOSE PF(Fluzone Trivalent) (Completed)       Return in about 3 months (around 05/13/2024) for DM recheck/chronic pain/Prevnar 20 .    Adina Crandall, NP

## 2024-02-12 NOTE — Assessment & Plan Note (Signed)
 Currently on apixaban  5 mg twice daily and metoprolol  50 mg daily.  Controlled.  Followed by cardiology continue following with cardiology continue taking medication as prescribed

## 2024-02-12 NOTE — Assessment & Plan Note (Signed)
 Currently maintained on alprazolam  0.5 mg 3 times daily as needed.  PDMP reviewed.  Continue

## 2024-02-12 NOTE — Assessment & Plan Note (Signed)
 Currently followed by urology.  Continue taking testosterone  as prescribed follow-up with urology as recommended.

## 2024-02-12 NOTE — Assessment & Plan Note (Signed)
 Maintained on hydrocodone .  PDMP reviewed no concerns reviewed most recent drug screen no concerns.  Continue medication as prescribed

## 2024-02-12 NOTE — Assessment & Plan Note (Signed)
 History of the same follow-up with pulmonology.  Currently undergoing workup for inspire device

## 2024-02-12 NOTE — Patient Instructions (Signed)
 Nice to see you today  I will be in touch once we have spoken to the pharmacy  We did update your flu vaccine today  We will do the pneumonia vaccine at next office visit

## 2024-02-12 NOTE — Assessment & Plan Note (Signed)
 History of same with chronic pain patient undergoing injections through orthopedist in stable hydrocodone .  Continue

## 2024-02-14 ENCOUNTER — Other Ambulatory Visit (INDEPENDENT_AMBULATORY_CARE_PROVIDER_SITE_OTHER): Payer: Self-pay | Admitting: Otolaryngology

## 2024-02-14 DIAGNOSIS — G4733 Obstructive sleep apnea (adult) (pediatric): Secondary | ICD-10-CM

## 2024-02-19 ENCOUNTER — Encounter: Admitting: Nurse Practitioner

## 2024-02-26 DIAGNOSIS — K080 Exfoliation of teeth due to systemic causes: Secondary | ICD-10-CM | POA: Diagnosis not present

## 2024-03-01 ENCOUNTER — Telehealth: Payer: Self-pay

## 2024-03-01 NOTE — Telephone Encounter (Signed)
 I think that it is aa alternative but for my understanding not a first line treatment. He would need to reach out to his cardiologist for further information

## 2024-03-01 NOTE — Telephone Encounter (Signed)
 Called pt and relayed information, Pt stated that he will contact cardiologist on Monday.  No further questions or concerns.

## 2024-03-01 NOTE — Telephone Encounter (Signed)
 Copied from CRM [id number]. Topic: Clinical - Medication Question >> Mar 01, 2024 12:26 PM Viola F wrote: Reason for CRM: Patient wants to know what Adina Crandall think about the watchmen implant - it's a replacement for Eliquis  and wants to know if he is eligible? Please call him at [phone] (M)

## 2024-03-04 ENCOUNTER — Telehealth (INDEPENDENT_AMBULATORY_CARE_PROVIDER_SITE_OTHER): Payer: Self-pay

## 2024-03-04 NOTE — Telephone Encounter (Signed)
 Gave patient a call regarding upcoming surgery. Patient would like to schedule a virtual appointment for preop. Please advise.

## 2024-03-05 ENCOUNTER — Telehealth: Payer: Self-pay | Admitting: Cardiovascular Disease

## 2024-03-05 ENCOUNTER — Telehealth (INDEPENDENT_AMBULATORY_CARE_PROVIDER_SITE_OTHER): Payer: Self-pay | Admitting: Otolaryngology

## 2024-03-05 ENCOUNTER — Telehealth: Payer: Self-pay

## 2024-03-05 NOTE — Telephone Encounter (Signed)
 LVM for patient to call back to schedule telephone appointment this week.

## 2024-03-05 NOTE — Telephone Encounter (Signed)
 Pt would like to know if he's a good candidate for a watchman.

## 2024-03-05 NOTE — Telephone Encounter (Signed)
 Copied from CRM [id number]. Topic: Clinical - Medication Question >> Mar 05, 2024  4:06 PM Drema MATSU wrote: Reason for CRM: Patient states that he has been trying to lose weight but has not been sucessful. Patient is wanting to know if he can get a prescription for weight loss.

## 2024-03-06 NOTE — Telephone Encounter (Signed)
 Spoke with the patient, who stated he would like to know if he is a candidate for the Watchman procedure in order to discontinue Eliquis . Informed the patient that we will send a message to Dr. Gollan for review.

## 2024-03-07 ENCOUNTER — Other Ambulatory Visit: Payer: Self-pay | Admitting: Family

## 2024-03-08 ENCOUNTER — Ambulatory Visit (INDEPENDENT_AMBULATORY_CARE_PROVIDER_SITE_OTHER): Admitting: Otolaryngology

## 2024-03-08 NOTE — Telephone Encounter (Signed)
 We can let the patient know I have sent in refills of the medications

## 2024-03-08 NOTE — Telephone Encounter (Signed)
 Patient is calling back for update. Please advise

## 2024-03-08 NOTE — Telephone Encounter (Signed)
 Pt made aware of the following recommendations and verbalized understanding.    Watchman is typically reserved (insurance typically only covers ) for patients who are at increased risk when on the blood thinners such as people with recurrent GI bleeding, balance problems/ frequent falls, etc. Thx Tgollan

## 2024-03-08 NOTE — Telephone Encounter (Unsigned)
 Copied from CRM [id number]. Topic: Clinical - Prescription Issue >> Mar 08, 2024 10:55 AM Alfonso HERO wrote: Reason for CRM: Patient called to check on the status of refill request for HYDROcodone -Acetaminophen  5-325 MG (TAKE 1 TABLET BY MOUTH EVERY 6 HOURS AS NEEDED FOR MODERATE PAIN (PAIN SCORE 4-6)), ALPRAZolam  0.5 MG (0.5 mg Oral 3 times daily PRN) please call patient when you have time.

## 2024-03-11 ENCOUNTER — Encounter (INDEPENDENT_AMBULATORY_CARE_PROVIDER_SITE_OTHER): Admitting: Otolaryngology

## 2024-03-11 ENCOUNTER — Telehealth (HOSPITAL_BASED_OUTPATIENT_CLINIC_OR_DEPARTMENT_OTHER): Payer: Self-pay | Admitting: *Deleted

## 2024-03-11 ENCOUNTER — Telehealth: Payer: Self-pay | Admitting: Cardiovascular Disease

## 2024-03-11 ENCOUNTER — Telehealth (INDEPENDENT_AMBULATORY_CARE_PROVIDER_SITE_OTHER): Payer: Self-pay | Admitting: Otolaryngology

## 2024-03-11 DIAGNOSIS — M533 Sacrococcygeal disorders, not elsewhere classified: Secondary | ICD-10-CM | POA: Diagnosis not present

## 2024-03-11 DIAGNOSIS — M545 Low back pain, unspecified: Secondary | ICD-10-CM | POA: Diagnosis not present

## 2024-03-11 NOTE — Telephone Encounter (Signed)
 Please advise holding Eliquis  prior to inspire implant on 03/15/2024.  Thank you!  DW

## 2024-03-11 NOTE — Telephone Encounter (Signed)
 Patient returned Pre-op call.

## 2024-03-11 NOTE — Telephone Encounter (Signed)
 Pt has been scheduled tele preop appt 03/12/24. Per preop APP to add on today or tomorrow. Med rec and consent are done.     Patient Consent for Virtual Visit        Dakota Ellison has provided verbal consent on 03/11/2024 for a virtual visit (video or telephone).   CONSENT FOR VIRTUAL VISIT FOR:  Dakota Ellison  By participating in this virtual visit I agree to the following:  I hereby voluntarily request, consent and authorize [facility] and its employed or contracted physicians, physician assistants, nurse practitioners or other licensed health care professionals (the Practitioner), to provide me with telemedicine health care services (the "Services) as deemed necessary by the treating Practitioner. I acknowledge and consent to receive the Services by the Practitioner via telemedicine. I understand that the telemedicine visit will involve communicating with the Practitioner through live audiovisual communication technology and the disclosure of certain medical information by electronic transmission. I acknowledge that I have been given the opportunity to request an in-person assessment or other available alternative prior to the telemedicine visit and am voluntarily participating in the telemedicine visit.  I understand that I have the right to withhold or withdraw my consent to the use of telemedicine in the course of my care at any time, without affecting my right to future care or treatment, and that the Practitioner or I may terminate the telemedicine visit at any time. I understand that I have the right to inspect all information obtained and/or recorded in the course of the telemedicine visit and may receive copies of available information for a reasonable fee.  I understand that some of the potential risks of receiving the Services via telemedicine include:  Delay or interruption in medical evaluation due to technological equipment failure or disruption; Information transmitted  may not be sufficient (e.g. poor resolution of images) to allow for appropriate medical decision making by the Practitioner; and/or  In rare instances, security protocols could fail, causing a breach of personal health information.  Furthermore, I acknowledge that it is my responsibility to provide information about my medical history, conditions and care that is complete and accurate to the best of my ability. I acknowledge that Practitioner's advice, recommendations, and/or decision may be based on factors not within their control, such as incomplete or inaccurate data provided by me or distortions of diagnostic images or specimens that may result from electronic transmissions. I understand that the practice of medicine is not an exact science and that Practitioner makes no warranties or guarantees regarding treatment outcomes. I acknowledge that a copy of this consent can be made available to me via my patient portal [facility] MyChart), or I can request a printed copy by calling the office of [facility].    I understand that my insurance will be billed for this visit.   I have read or had this consent read to me. I understand the contents of this consent, which adequately explains the benefits and risks of the Services being provided via telemedicine.  I have been provided ample opportunity to ask questions regarding this consent and the Services and have had my questions answered to my satisfaction. I give my informed consent for the services to be provided through the use of telemedicine in my medical care

## 2024-03-11 NOTE — Telephone Encounter (Signed)
   Pre-operative Risk Assessment    Patient Name: Dakota Ellison  DOB: [birth year] MRN: [id number]   Date of last office visit: 12/11/23  Date of next office visit: n/a due next July    Request for Surgical Clearance    Procedure:  Inspire implant insertion   Date of Surgery:  Clearance 03/15/24                                Surgeon:  Dr. Okey  Surgeon's Group or Practice Name:  [facility]  Phone number:  [phone] Fax number:  [phone]   Type of Clearance Requested:   - Medical  - Pharmacy:  Hold Apixaban  (Eliquis )     Type of Anesthesia:  General    Additional requests/questions:    Bonney Sheffield JONELLE Lenora   03/11/2024, 10:22 AM

## 2024-03-11 NOTE — Telephone Encounter (Signed)
 Pt has been scheduled tele preop appt 03/12/24. Per preop APP to add on today or tomorrow. Med rec and consent are done.

## 2024-03-11 NOTE — H&P (View-Only) (Signed)
 Contacted the patient three times, phone sends the call to voicemail.

## 2024-03-11 NOTE — Telephone Encounter (Signed)
   Name: Dakota Ellison  DOB: May 07, 1951  MRN: [id number]  Primary Cardiologist: Evalene Lunger, MD   Preoperative team, please contact this patient and set up a phone call appointment for further preoperative risk assessment. Please obtain consent and complete medication review. Thank you for your help.  Okay to add to schedule today 10/6 or tomorrow 10/7.  I confirm that guidance regarding antiplatelet and oral anticoagulation therapy has been completed and, if necessary, noted below.  Awaiting pharmacy recommendations for Eliquis  hold.   I also confirmed the patient resides in the state of [place] . As per [employer] Medical Board telemedicine laws, the patient must reside in the state in which the provider is licensed.    Barnie Hila, NP 03/11/2024, 11:56 AM [facility]

## 2024-03-11 NOTE — Progress Notes (Signed)
 Contacted the patient three times, phone sends the call to voicemail.

## 2024-03-11 NOTE — Telephone Encounter (Signed)
 Patient tried to call in for telephone appointment.  CMA told me that Dr Soldatova will try to call him tomorrow.

## 2024-03-12 ENCOUNTER — Ambulatory Visit: Attending: Cardiovascular Disease | Admitting: Student

## 2024-03-12 DIAGNOSIS — Z01810 Encounter for preprocedural cardiovascular examination: Secondary | ICD-10-CM | POA: Diagnosis not present

## 2024-03-12 NOTE — Telephone Encounter (Signed)
 Patient with diagnosis of PAF on Eliquis  for anticoagulation.    Procedure: Inspire implant insertion    Date of procedure: 03/15/24   CHA2DS2-VASc Score = 3  This indicates a 3.2% annual risk of stroke. The patient's score is based upon: CHF History: 0 HTN History: 1 Diabetes History: 1 Stroke History: 0 Vascular Disease History: 0 Age Score: 1 Gender Score: 0    CrCl 96 ml/min Platelet count 211K  Patient has not had an Afib/aflutter ablation in the last 3 months, DCCV within the last 4 weeks or a watchman implanted in the last 45 days    Per office protocol, patient can hold Eliquis  for 1 days prior to procedure.    **This guidance is not considered finalized until pre-operative APP has relayed final recommendations.**

## 2024-03-12 NOTE — Progress Notes (Signed)
 Virtual Visit via Telephone Note   Because of Dakota Ellison's co-morbid illnesses, he is at least at moderate risk for complications without adequate follow up.  This format is felt to be most appropriate for this patient at this time.  The patient did not have access to video technology/had technical difficulties with video requiring transitioning to audio format only (telephone).  All issues noted in this document were discussed and addressed.  No physical exam could be performed with this format.  Please refer to the patient's chart for his consent to telehealth for [facility].  Evaluation Performed:  Preoperative cardiovascular risk assessment _____________   Date:  03/12/2024   Patient ID:  Dakota Ellison, DOB [birth year], MRN [id number] Patient Location:  Home Provider location:   Office  Primary Care Provider:  Wendee Lynwood HERO, NP  Primary Cardiologist:  [facility] Providers Cardiologist:  Evalene Lunger, MD    Chief Complaint / Patient Profile   73 y.o. y/o male with a h/o PAF on anticoagulation, hypertension, hyperlipidemia, OSA, asthma, GERD who is pending inspire implant insertion by Dr. Soldatova and presents today for telephonic preoperative cardiovascular risk assessment.  History of Present Illness    Dakota Ellison is a 73 y.o. male who presents via audio/video conferencing for a telehealth visit today.  Pt was last seen in cardiology clinic on 12/11/2023 by Bernardino Bring, PA-C.  At that time Dakota Ellison was stable from a cardiac standpoint.  The patient is now pending procedure as outlined above. Since his last visit, he is doing well. Patient denies shortness of breath, dyspnea on exertion, lower extremity edema, orthopnea or PND. No chest pain, pressure, or tightness. No palpitations.  He does not participate in routine exercise. 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   Past Medical History    Past Medical History:   Diagnosis Date   Anxiety state 04/08/2014   denies   Chronic back pain    Complication of anesthesia    and trouble waking up   Degenerative disc disease    Dysthymia    denies   ED (erectile dysfunction)    GERD (gastroesophageal reflux disease)    History of MRSA infection    on lip from cut   Hyperlipemia    Hypertension    take BP med for small leak in heart to keep it small per pt.   Mild asthma    as a teen   Morbid obesity (HCC)    OSA (obstructive sleep apnea)    Osteoarthritis    PAF (paroxysmal atrial fibrillation) (HCC) 2023   PONV (postoperative nausea and vomiting)    Rectal fissure    Testosterone  deficiency    Past Surgical History:  Procedure Laterality Date   DRUG INDUCED ENDOSCOPY N/A 12/25/2023   Procedure: DRUG INDUCED SLEEP ENDOSCOPY;  Surgeon: Okey Burns, MD;  Location: [facility];  Service: ENT;  Laterality: N/A;   HERNIA REPAIR     1998   KNEE SURGERY  09/2010   left knee and right knee arthroscopy   OTHER SURGICAL HISTORY     arthroscopic subacromial decompression   OTHER SURGICAL HISTORY     open resection, distal right clavicle   SHOULDER ARTHROSCOPY     right   SPHINCTEROTOMY     TOTAL KNEE ARTHROPLASTY Left 06/11/2018   Procedure: LEFT TOTAL KNEE ARTHROPLASTY;  Surgeon: Melodi Lerner, MD;  Location: [facility];  Service:  Orthopedics;  Laterality: Left;    TOTAL KNEE ARTHROPLASTY Right 12/17/2018   Procedure: TOTAL KNEE ARTHROPLASTY;  Surgeon: Melodi Lerner, MD;  Location: [facility];  Service: Orthopedics;  Laterality: Right;     Allergies  Allergies  Allergen Reactions   Atorvastatin  Other (See Comments)    Muscle weakness in his legs   Crestor  [Rosuvastatin ] Other (See Comments)    Severe muscle cramping   Zocor [Simvastatin] Other (See Comments)    Muscle weakness in his legs   Atorvastatin  Calcium  Other (See Comments)    Home Medications    Prior to Admission medications   Medication Sig Start  Date End Date Taking? Authorizing Provider  allopurinol (ZYLOPRIM) 100 MG tablet TAKE 1 TABLET BY MOUTH TWICE DAILY 10/27/23   Letvak, Richard I, MD  ALPRAZolam  (XANAX ) 0.5 MG tablet TAKE 1 TABLET BY MOUTH 3 TIMES DAILY AS NEEDED 03/08/24   Wendee Lynwood HERO, NP  apixaban  (ELIQUIS ) 5 MG TABS tablet Take 1 tablet (5 mg total) by mouth 2 (two) times daily. 05/29/23   Jimmy Charlie FERNS, MD  Cholecalciferol (VITAMIN D ) 50 MCG (2000 UT) CAPS Take 1 capsule by mouth daily.    [provider]  clindamycin (CLEOCIN) 300 MG capsule Take 300 mg by mouth 3 (three) times daily. 02/09/24   [provider]  ezetimibe  (ZETIA ) 10 MG tablet TAKE 1 TABLET BY MOUTH DAILY 01/26/24   Jimmy Charlie FERNS, MD  fluticasone  (FLONASE ) 50 MCG/ACT nasal spray Place 2 sprays into both nostrils 2 (two) times daily. 02/09/24   Soldatova, Liuba, MD  Garlic 2000 MG CAPS Take 2,000 mg by mouth 2 (two) times daily.     [provider]  HYDROcodone -acetaminophen  (NORCO/VICODIN) 5-325 MG tablet Take 1 tablet by mouth every 6 (six) hours as needed for moderate pain (pain score 4-6). 03/08/24   Wendee Lynwood HERO, NP  levocetirizine (XYZAL  ALLERGY 24HR) 5 MG tablet Take 1 tablet (5 mg total) by mouth every evening. 02/09/24   Soldatova, Liuba, MD  metoprolol  succinate (TOPROL -XL) 50 MG 24 hr tablet Take 1 tablet (50 mg total) by mouth daily. 02/08/23   Jimmy Charlie FERNS, MD  Multiple Vitamin (MULTIVITAMIN WITH MINERALS) TABS tablet Take 1 tablet by mouth daily.    [provider]  mupirocin  ointment (BACTROBAN ) 2 % Apply topically daily. 01/31/24   [provider]  niacin 500 MG CR capsule Take 500 mg by mouth 2 (two) times daily.    [provider]  Omega-3 Fatty Acids (FISH OIL) 1200 MG CPDR Take 1,200 mg by mouth daily.    [provider]  omeprazole  (PRILOSEC) 20 MG capsule TAKE 1 CAPSULE BY MOUTH DAILY 01/26/24   Jimmy Charlie FERNS, MD  oxyCODONE  (OXY IR/ROXICODONE ) 5 MG immediate release  tablet Take 1 tablet every 4-6 hours by oral route as needed for pain for 7 days. 07/24/23   [provider]  SANTYL 250 UNIT/GM ointment SMARTSIG:2.5 CENTIMETER(S) Topical Daily 02/09/24   [provider]  sertraline  (ZOLOFT ) 25 MG tablet TAKE 1 TABLET BY MOUTH DAILY 01/26/24   Jimmy Charlie FERNS, MD  sildenafil  (VIAGRA ) 100 MG tablet Take as directed Patient taking differently: Take 100 mg by mouth daily as needed for erectile dysfunction. Take as directed 10/03/11   Christi Glendia HERO, MD  SYSTANE ULTRA 0.4-0.3 % SOLN Place 1 drop into both eyes 4 (four) times daily as needed (dry/irritated eyes.).    [provider]  testosterone  cypionate (DEPOTESTOSTERONE CYPIONATE) 200 MG/ML injection  Inject 200 mg into the muscle every 14 (fourteen) days. Saturdays 12/03/18   [provider]  triamcinolone  cream (KENALOG ) 0.1 % Apply 1 Application topically 2 (two) times daily as needed. 08/07/23   Letvak, Richard I, MD  Turmeric (QC TUMERIC COMPLEX PO) Take 500 mg by mouth daily.    [provider]  valsartan -hydrochlorothiazide  (DIOVAN -HCT) 320-25 MG tablet TAKE 1 TABLET BY MOUTH DAILY 01/26/24   Jimmy Charlie FERNS, MD  vitamin C (ASCORBIC ACID) 500 MG tablet Take 500 mg by mouth daily.    [provider]  zinc gluconate 50 MG tablet Take 50 mg by mouth daily.    [provider]    Physical Exam    Vital Signs:  JAMAHL LEMMONS does not have vital signs available for review today.  Given telephonic nature of communication, physical exam is limited. AAOx3. NAD. Normal affect.  Speech and respirations are unlabored.   Assessment & Plan    Preoperative cardiovascular risk assessment.  Inspire implant insertion by Dr. Soldatova on 03/15/2024  Chart reviewed as part of pre-operative protocol coverage. According to the RCRI, patient has a 0.4% risk of MACE. 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   Given past medical  history and time since last visit, based on ACC/AHA guidelines, RONTAVIOUS ALBRIGHT would be at acceptable risk for the planned procedure without further cardiovascular testing.   Patient was advised that if he develops new symptoms prior to surgery to contact our office to arrange a follow-up appointment.  he verbalized understanding.  Per Pharm D, patient has not had an Afib/aflutter ablation within the last 90 days, DCCV within the last 4 weeks, or Watchman in the last 45 days. Patient may hold Eliquis  for 1 day prior to procedure.    I will route this recommendation to the requesting party via Epic fax function.  Please call with questions.  Time:   Today, I have spent 5 minutes with the patient with telehealth technology discussing medical history, symptoms, and management plan.     Barnie Hila, NP  03/12/2024, 10:51 AM

## 2024-03-14 ENCOUNTER — Telehealth (INDEPENDENT_AMBULATORY_CARE_PROVIDER_SITE_OTHER): Payer: Self-pay | Admitting: Otolaryngology

## 2024-03-14 ENCOUNTER — Encounter (HOSPITAL_COMMUNITY): Payer: Self-pay | Admitting: Medical

## 2024-03-14 ENCOUNTER — Other Ambulatory Visit: Payer: Self-pay

## 2024-03-14 NOTE — Progress Notes (Signed)
 Case: [id number] Date/Time: 03/15/24 1245   Procedure: INSERTION, HYPOGLOSSAL NERVE STIMULATOR - Inspire V   Anesthesia type: General   Diagnosis: Obstructive sleep apnea (adult) (pediatric) [G47.33]   Pre-op diagnosis: Obstructive sleep apnea   [facility]OM 08 / [facility]   Surgeons: Okey Burns, MD       DISCUSSION: Dakota Ellison is a 73 yo male who is SDW prior to surgery above. PMH of former smoking, HTN, PAF, asthma, OSA, GERD, arthritis, chronic back pain with narcotic dependence, anxiety, depression.  Prior complication from anesthesia includes PONV, prolonged emergence.  Hx of OSA and asthma, follows with Pulmonology. Currently uses CPAP for OSA however notes indicate there is suboptimal control and he was referred to ENT for consideration of Inspire.  Patient follows with Cardiology for hx of PAF on Eliquis  diagnosed in 2023. Last seen in clinic on 12/11/23 for pre op clearance prior to epidural injection and was cleared. Had repeat cardiac clearance tele visit on 03/12/24 prior to above surgery and was cleared as well:  Preoperative cardiovascular risk assessment.  Inspire implant insertion by Dr. Soldatova on 03/15/2024  Chart reviewed as part of pre-operative protocol coverage. According to the RCRI, patient has a 0.4% risk of MACE. 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  Given past medical history and time since last visit, based on ACC/AHA guidelines, Dakota Ellison would be at acceptable risk for the planned procedure without further cardiovascular testing.   EKG 12/11/23:  NSR, rate 65   CT calcium  score 04/04/2018:  IMPRESSION: Coronary calcium  score of 0. This was 0 percentile for age and sex matched control.  Echo 08/30/2006:  Interpretation summary:  The study was technically difficult. The left ventricle is normal in size There is normal left ventricular wall thickness Left ventricular systolic function is normal No  regional wall motion abnormalities noted Regional wall motion abnormalities cannot be excluded due to limited visualization The right ventricle is mild to moderately dilated Consider TEE for enhanced visualization of the cardiac structures if clinically indicated   Past Medical History:  Diagnosis Date   Anxiety state 04/08/2014   denies   Chronic back pain    Complication of anesthesia    and trouble waking up   Degenerative disc disease    Dysthymia    denies   ED (erectile dysfunction)    GERD (gastroesophageal reflux disease)    History of MRSA infection    on lip from cut   Hyperlipemia    Hypertension    take BP med for small leak in heart to keep it small per pt.   Mild asthma    as a teen   Morbid obesity (HCC)    OSA (obstructive sleep apnea)    Osteoarthritis    PAF (paroxysmal atrial fibrillation) (HCC) 2023   PONV (postoperative nausea and vomiting)    Rectal fissure    Testosterone  deficiency     Past Surgical History:  Procedure Laterality Date   DRUG INDUCED ENDOSCOPY N/A 12/25/2023   Procedure: DRUG INDUCED SLEEP ENDOSCOPY;  Surgeon: Okey Burns, MD;  Location: [facility];  Service: ENT;  Laterality: N/A;   HERNIA REPAIR     1998   KNEE SURGERY  09/2010   left knee and right knee arthroscopy   OTHER SURGICAL HISTORY     arthroscopic subacromial decompression   OTHER SURGICAL HISTORY     open resection, distal right clavicle   SHOULDER ARTHROSCOPY  right   SPHINCTEROTOMY     TOTAL KNEE ARTHROPLASTY Left 06/11/2018   Procedure: LEFT TOTAL KNEE ARTHROPLASTY;  Surgeon: Melodi Lerner, MD;  Location: [facility];  Service: Orthopedics;  Laterality: Left;    TOTAL KNEE ARTHROPLASTY Right 12/17/2018   Procedure: TOTAL KNEE ARTHROPLASTY;  Surgeon: Melodi Lerner, MD;  Location: [facility];  Service: Orthopedics;  Laterality: Right;     MEDICATIONS: No current facility-administered medications for this encounter.    acetaminophen   (TYLENOL ) 500 MG tablet   allopurinol (ZYLOPRIM) 100 MG tablet   ALPRAZolam  (XANAX ) 0.5 MG tablet   apixaban  (ELIQUIS ) 5 MG TABS tablet   Cholecalciferol (VITAMIN D ) 50 MCG (2000 UT) CAPS   ezetimibe  (ZETIA ) 10 MG tablet   fluticasone  (FLONASE ) 50 MCG/ACT nasal spray   Garlic 2000 MG CAPS   HYDROcodone -acetaminophen  (NORCO/VICODIN) 5-325 MG tablet   levocetirizine (XYZAL  ALLERGY 24HR) 5 MG tablet   metoprolol  succinate (TOPROL -XL) 50 MG 24 hr tablet   Multiple Vitamin (MULTIVITAMIN WITH MINERALS) TABS tablet   niacin 500 MG CR capsule   Omega-3 Fatty Acids (FISH OIL) 1200 MG CPDR   omeprazole  (PRILOSEC) 20 MG capsule   sertraline  (ZOLOFT ) 25 MG tablet   sildenafil  (VIAGRA ) 100 MG tablet   SYSTANE ULTRA 0.4-0.3 % SOLN   testosterone  cypionate (DEPOTESTOSTERONE CYPIONATE) 200 MG/ML injection   Turmeric (QC TUMERIC COMPLEX PO)   valsartan -hydrochlorothiazide  (DIOVAN -HCT) 320-25 MG tablet   vitamin C (ASCORBIC ACID) 500 MG tablet   zinc gluconate 50 MG tablet   Dakota Ellison Senna, PA-C [facility] Short Stay/Anesthesiology [facility] Phone [phone] 03/14/2024 9:12 AM

## 2024-03-14 NOTE — Telephone Encounter (Signed)
 ENT Progress Note  Connected with the patient by phone. He had cardiac clearance and received Eliquis  instructions. Risks and benefits of surgery were discussed and he would like to proceed.

## 2024-03-14 NOTE — Progress Notes (Signed)
 SDW call  Patient was given pre-op instructions over the phone. Patient verbalized understanding of instructions provided.     PCP - Lynwood Crandall, NP Cardiologist - Dr. Evalene Lunger, LOV 03/12/2024, clearance Pulmonary:    PPM/ICD - denies Device Orders - na Rep Notified - na   Chest x-ray - na EKG -  12/11/2023 Stress Test - ECHO - 08/28/2006 Cardiac Cath -   Sleep Study/sleep apnea/CPAP: OSA with nightly CPAP  Non-diabetic  Blood Thinner Instructions: Eliquis , states last dose 03/13/2024 Aspirin  Instructions:denies   ERAS Protcol - NPO   Anesthesia review: Yes. A-Fib, HLD, OSA with CPAP, HTN   Patient denies shortness of breath, fever, cough and chest pain over the phone call  Your procedure is scheduled on Friday March 15, 2024  Report to [facility] Main Entrance A at  1030  A.M., then check in with the Admitting office.  Call this number if you have problems the morning of surgery:  [phone]   If you have any questions prior to your surgery date call [phone]: Open Monday-Friday 8am-4pm If you experience any cold or flu symptoms such as cough, fever, chills, shortness of breath, etc. between now and your scheduled surgery, please notify us  at the above number    Remember:  Do not eat or drink after midnight the night before your surgery  Take these medicines the morning of surgery with A SIP OF WATER:  Allopurinol, metoprolol , prilosec, zoloft   As needed: Tylenol , xanax , flonase , norco  As of today, STOP taking any Aspirin  (unless otherwise instructed by your surgeon) Aleve, Naproxen, Ibuprofen, Motrin, Advil, Goody's, BC's, all herbal medications, fish oil, and all vitamins.

## 2024-03-14 NOTE — Anesthesia Preprocedure Evaluation (Addendum)
 Anesthesia Evaluation  Patient identified by MRN, date of birth, ID band Patient awake    Reviewed: Allergy & Precautions, NPO status , Patient's Chart, lab work & pertinent test results, reviewed documented beta blocker date and time   History of Anesthesia Complications (+) PONV, PROLONGED EMERGENCE and history of anesthetic complications  Airway Mallampati: IV  TM Distance: >3 FB Neck ROM: Full    Dental  (+) Teeth Intact, Dental Advisory Given   Pulmonary asthma , sleep apnea and Continuous Positive Airway Pressure Ventilation , former smoker   Pulmonary exam normal breath sounds clear to auscultation       Cardiovascular hypertension (130/82 preop), Pt. on medications and Pt. on home beta blockers Normal cardiovascular exam+ dysrhythmias (eliquis  LD: 2d ago) Atrial Fibrillation (-) pacemaker Rhythm:Regular Rate:Normal  EKG 12/11/23:   NSR, rate 65     CT calcium  score 04/04/2018:   IMPRESSION: Coronary calcium  score of 0. This was 0 percentile for age and sex matched control.   Echo 08/30/2006:   Interpretation summary:   The study was technically difficult. The left ventricle is normal in size There is normal left ventricular wall thickness Left ventricular systolic function is normal No regional wall motion abnormalities noted Regional wall motion abnormalities cannot be excluded due to limited visualization The right ventricle is mild to moderately dilated Consider TEE for enhanced visualization of the cardiac structures if clinically indicated    Neuro/Psych  PSYCHIATRIC DISORDERS Anxiety Depression    negative neurological ROS     GI/Hepatic Neg liver ROS,GERD  Medicated and Controlled,,  Endo/Other  Obesity BMI 37  Renal/GU negative Renal ROS  negative genitourinary   Musculoskeletal  (+) Arthritis , Osteoarthritis,  Chronic back pain: hydrocodone  5/325   Abdominal  (+) + obese  Peds   Hematology negative hematology ROS (+)   Anesthesia Other Findings   Reproductive/Obstetrics negative OB ROS                              Anesthesia Physical Anesthesia Plan  ASA: 3  Anesthesia Plan: General   Post-op Pain Management: Tylenol  PO (pre-op)*   Induction: Intravenous  PONV Risk Score and Plan: 3 and Ondansetron , Dexamethasone , Treatment may vary due to age or medical condition, Propofol  infusion and TIVA  Airway Management Planned: Oral ETT and Video Laryngoscope Planned  Additional Equipment: None  Intra-op Plan:   Post-operative Plan: Extubation in OR  Informed Consent: I have reviewed the patients History and Physical, chart, labs and discussed the procedure including the risks, benefits and alternatives for the proposed anesthesia with the patient or authorized representative who has indicated his/her understanding and acceptance.     Dental advisory given  Plan Discussed with: CRNA  Anesthesia Plan Comments:          Anesthesia Quick Evaluation

## 2024-03-15 ENCOUNTER — Ambulatory Visit (HOSPITAL_COMMUNITY)
Admission: RE | Admit: 2024-03-15 | Discharge: 2024-03-15 | Disposition: A | Discharge status: Home / Self Care | Attending: Otolaryngology | Admitting: Otolaryngology

## 2024-03-15 ENCOUNTER — Ambulatory Visit (HOSPITAL_COMMUNITY)

## 2024-03-15 ENCOUNTER — Other Ambulatory Visit: Payer: Self-pay

## 2024-03-15 ENCOUNTER — Ambulatory Visit (HOSPITAL_BASED_OUTPATIENT_CLINIC_OR_DEPARTMENT_OTHER): Payer: Self-pay | Admitting: Medical

## 2024-03-15 ENCOUNTER — Encounter (HOSPITAL_COMMUNITY): Payer: Self-pay

## 2024-03-15 ENCOUNTER — Encounter (HOSPITAL_COMMUNITY): Payer: Self-pay | Admitting: Medical

## 2024-03-15 ENCOUNTER — Encounter (HOSPITAL_COMMUNITY)
Admission: RE | Disposition: A | Discharge status: Home / Self Care | Payer: Self-pay | Source: Home / Self Care | Attending: Otolaryngology

## 2024-03-15 DIAGNOSIS — Z7901 Long term (current) use of anticoagulants: Secondary | ICD-10-CM | POA: Diagnosis not present

## 2024-03-15 DIAGNOSIS — M549 Dorsalgia, unspecified: Secondary | ICD-10-CM | POA: Diagnosis not present

## 2024-03-15 DIAGNOSIS — I480 Paroxysmal atrial fibrillation: Secondary | ICD-10-CM | POA: Diagnosis not present

## 2024-03-15 DIAGNOSIS — G4733 Obstructive sleep apnea (adult) (pediatric): Secondary | ICD-10-CM | POA: Diagnosis not present

## 2024-03-15 DIAGNOSIS — Z87891 Personal history of nicotine dependence: Secondary | ICD-10-CM | POA: Diagnosis not present

## 2024-03-15 DIAGNOSIS — G8929 Other chronic pain: Secondary | ICD-10-CM | POA: Insufficient documentation

## 2024-03-15 DIAGNOSIS — Z6837 Body mass index (BMI) 37.0-37.9, adult: Secondary | ICD-10-CM

## 2024-03-15 DIAGNOSIS — I10 Essential (primary) hypertension: Secondary | ICD-10-CM | POA: Diagnosis not present

## 2024-03-15 DIAGNOSIS — Z91198 Patient's noncompliance with other medical treatment and regimen for other reason: Secondary | ICD-10-CM

## 2024-03-15 DIAGNOSIS — K219 Gastro-esophageal reflux disease without esophagitis: Secondary | ICD-10-CM | POA: Insufficient documentation

## 2024-03-15 DIAGNOSIS — F1120 Opioid dependence, uncomplicated: Secondary | ICD-10-CM | POA: Diagnosis not present

## 2024-03-15 DIAGNOSIS — F411 Generalized anxiety disorder: Secondary | ICD-10-CM | POA: Insufficient documentation

## 2024-03-15 DIAGNOSIS — J45909 Unspecified asthma, uncomplicated: Secondary | ICD-10-CM | POA: Diagnosis not present

## 2024-03-15 DIAGNOSIS — M1990 Unspecified osteoarthritis, unspecified site: Secondary | ICD-10-CM | POA: Diagnosis not present

## 2024-03-15 DIAGNOSIS — E66811 Obesity, class 1: Secondary | ICD-10-CM | POA: Insufficient documentation

## 2024-03-15 HISTORY — PX: IMPLANTATION OF HYPOGLOSSAL NERVE STIMULATOR: SHX6827

## 2024-03-15 HISTORY — DX: Cardiac arrhythmia, unspecified: I49.9

## 2024-03-15 LAB — POCT I-STAT, CHEM 8
BUN: 32 mg/dL — ABNORMAL HIGH (ref 8–23)
Calcium, Ion: 1.19 mmol/L (ref 1.15–1.40)
Chloride: 106 mmol/L (ref 98–111)
Creatinine, Ser: 1.1 mg/dL (ref 0.61–1.24)
Glucose, Bld: 153 mg/dL — ABNORMAL HIGH (ref 70–99)
HCT: 48 % (ref 39.0–52.0)
Hemoglobin: 16.3 g/dL (ref 13.0–17.0)
Potassium: 4 mmol/L (ref 3.5–5.1)
Sodium: 141 mmol/L (ref 135–145)
TCO2: 23 mmol/L (ref 22–32)

## 2024-03-15 SURGERY — INSERTION, HYPOGLOSSAL NERVE STIMULATOR
Anesthesia: General

## 2024-03-15 MED ORDER — OXYCODONE HCL 5 MG/5ML PO SOLN: 5.0000 mg | Freq: Once | ORAL | Status: AC | PRN

## 2024-03-15 MED ORDER — DEXAMETHASONE SOD PHOSPHATE PF 10 MG/ML IJ SOLN
INTRAMUSCULAR | Status: DC | PRN
  Administered 2024-03-15: 10 mg via INTRAVENOUS

## 2024-03-15 MED ORDER — AMOXICILLIN-POT CLAVULANATE 875-125 MG PO TABS: 1.0000 | ORAL_TABLET | Freq: Two times a day (BID) | ORAL | 0 refills | Status: DC

## 2024-03-15 MED ORDER — ONDANSETRON HCL 4 MG/2ML IJ SOLN: 4.0000 mg | Freq: Once | INTRAMUSCULAR | Status: DC | PRN

## 2024-03-15 MED ORDER — FENTANYL CITRATE (PF) 250 MCG/5ML IJ SOLN
INTRAMUSCULAR | Status: AC
  Filled 2024-03-15: qty 5

## 2024-03-15 MED ORDER — HYDROMORPHONE HCL 1 MG/ML IJ SOLN
INTRAMUSCULAR | Status: AC
  Filled 2024-03-15: qty 1

## 2024-03-15 MED ORDER — AMISULPRIDE (ANTIEMETIC) 5 MG/2ML IV SOLN: 10.0000 mg | Freq: Once | INTRAVENOUS | Status: DC | PRN

## 2024-03-15 MED ORDER — LIDOCAINE 2% (20 MG/ML) 5 ML SYRINGE
INTRAMUSCULAR | Status: DC | PRN
  Administered 2024-03-15: 100 mg via INTRAVENOUS

## 2024-03-15 MED ORDER — HYDROMORPHONE HCL 1 MG/ML IJ SOLN
0.2500 mg | INTRAMUSCULAR | Status: DC | PRN
  Administered 2024-03-15: 0.25 mg via INTRAVENOUS

## 2024-03-15 MED ORDER — LIDOCAINE-EPINEPHRINE 1 %-1:100000 IJ SOLN
INTRAMUSCULAR | Status: DC | PRN
  Administered 2024-03-15: 9 mL

## 2024-03-15 MED ORDER — FENTANYL CITRATE (PF) 250 MCG/5ML IJ SOLN
INTRAMUSCULAR | Status: DC | PRN
  Administered 2024-03-15: 50 ug via INTRAVENOUS
  Administered 2024-03-15 (×2): 100 ug via INTRAVENOUS

## 2024-03-15 MED ORDER — DEXMEDETOMIDINE HCL IN NACL 80 MCG/20ML IV SOLN
INTRAVENOUS | Status: DC | PRN
  Administered 2024-03-15: 8 ug via INTRAVENOUS
  Administered 2024-03-15: 12 ug via INTRAVENOUS

## 2024-03-15 MED ORDER — ACETAMINOPHEN 500 MG PO TABS: 1000.0000 mg | ORAL_TABLET | Freq: Once | ORAL | Status: AC

## 2024-03-15 MED ORDER — OXYCODONE HCL 5 MG PO TABS
5.0000 mg | ORAL_TABLET | Freq: Once | ORAL | Status: AC | PRN
  Administered 2024-03-15: 5 mg via ORAL

## 2024-03-15 MED ORDER — CEFAZOLIN SODIUM-DEXTROSE 2-4 GM/100ML-% IV SOLN
INTRAVENOUS | Status: AC
  Filled 2024-03-15: qty 100

## 2024-03-15 MED ORDER — CHLORHEXIDINE GLUCONATE 0.12 % MT SOLN: 15.0000 mL | Freq: Once | OROMUCOSAL | Status: AC

## 2024-03-15 MED ORDER — SODIUM CHLORIDE 0.9 % IV SOLN
3.0000 g | INTRAVENOUS | Status: AC
  Administered 2024-03-15: 3 g via INTRAVENOUS
  Filled 2024-03-15: qty 8

## 2024-03-15 MED ORDER — 0.9 % SODIUM CHLORIDE (POUR BTL) OPTIME
TOPICAL | Status: DC | PRN
  Administered 2024-03-15: 1000 mL

## 2024-03-15 MED ORDER — PROPOFOL 10 MG/ML IV BOLUS
INTRAVENOUS | Status: DC | PRN
Start: 2024-03-15 — End: 2024-03-15
  Administered 2024-03-15: 175 ug/kg/min via INTRAVENOUS
  Administered 2024-03-15: 150 mg via INTRAVENOUS
  Administered 2024-03-15: 50 mg via INTRAVENOUS
  Administered 2024-03-15: 150 ug/kg/min via INTRAVENOUS
  Administered 2024-03-15 (×2): 50 mg via INTRAVENOUS

## 2024-03-15 MED ORDER — OXYCODONE HCL 5 MG PO TABS: 5.0000 mg | ORAL_TABLET | Freq: Three times a day (TID) | ORAL | 0 refills | Status: AC | PRN

## 2024-03-15 MED ORDER — SUCCINYLCHOLINE CHLORIDE 200 MG/10ML IV SOSY
PREFILLED_SYRINGE | INTRAVENOUS | Status: DC | PRN
  Administered 2024-03-15: 160 mg via INTRAVENOUS

## 2024-03-15 MED ORDER — OXYCODONE HCL 5 MG PO TABS
ORAL_TABLET | ORAL | Status: AC
  Filled 2024-03-15: qty 1

## 2024-03-15 MED ORDER — MIDAZOLAM HCL 2 MG/2ML IJ SOLN
INTRAMUSCULAR | Status: DC | PRN
  Administered 2024-03-15: 2 mg via INTRAVENOUS

## 2024-03-15 MED ORDER — HYDROMORPHONE HCL 1 MG/ML IJ SOLN
INTRAMUSCULAR | Status: AC
  Filled 2024-03-15: qty 0.5

## 2024-03-15 MED ORDER — MIDAZOLAM HCL 2 MG/2ML IJ SOLN
INTRAMUSCULAR | Status: AC
  Filled 2024-03-15: qty 2

## 2024-03-15 MED ORDER — ACETAMINOPHEN 500 MG PO TABS
ORAL_TABLET | ORAL | Status: AC
  Administered 2024-03-15: 1000 mg via ORAL
  Filled 2024-03-15: qty 2

## 2024-03-15 MED ORDER — HYDROMORPHONE HCL 1 MG/ML IJ SOLN
INTRAMUSCULAR | Status: DC | PRN
  Administered 2024-03-15: .5 mg via INTRAVENOUS

## 2024-03-15 MED ORDER — LACTATED RINGERS IV SOLN: INTRAVENOUS | Status: DC

## 2024-03-15 MED ORDER — ONDANSETRON HCL 4 MG/2ML IJ SOLN
INTRAMUSCULAR | Status: DC | PRN
  Administered 2024-03-15: 4 mg via INTRAVENOUS

## 2024-03-15 MED ORDER — IBUPROFEN 600 MG PO TABS: 600.0000 mg | ORAL_TABLET | Freq: Four times a day (QID) | ORAL | 0 refills | Status: AC

## 2024-03-15 MED ORDER — CHLORHEXIDINE GLUCONATE 0.12 % MT SOLN
OROMUCOSAL | Status: AC
  Administered 2024-03-15: 15 mL via OROMUCOSAL
  Filled 2024-03-15: qty 15

## 2024-03-15 MED ORDER — LIDOCAINE-EPINEPHRINE 1 %-1:100000 IJ SOLN
INTRAMUSCULAR | Status: AC
Start: 2024-03-15 — End: 2024-03-15
  Filled 2024-03-15: qty 1

## 2024-03-15 MED ORDER — SODIUM CHLORIDE 0.9 % IV SOLN
Freq: Once | INTRAVENOUS | Status: DC
  Filled 2024-03-15: qty 10

## 2024-03-15 MED ORDER — ORAL CARE MOUTH RINSE: 15.0000 mL | Freq: Once | OROMUCOSAL | Status: AC

## 2024-03-15 MED ORDER — ALBUTEROL SULFATE HFA 108 (90 BASE) MCG/ACT IN AERS
INHALATION_SPRAY | RESPIRATORY_TRACT | Status: DC | PRN
  Administered 2024-03-15: 4 via RESPIRATORY_TRACT

## 2024-03-15 MED ORDER — SODIUM CHLORIDE 0.9 % IV SOLN: 1.0000 | Freq: Once | INTRAVENOUS | Status: DC

## 2024-03-15 SURGICAL SUPPLY — 59 items
BAG DECANTER FOR FLEXI CONT (MISCELLANEOUS) ×1 IMPLANT
BLADE CLIPPER SURG (BLADE) IMPLANT
BLADE SURG 15 STRL LF DISP TIS (BLADE) ×3 IMPLANT
CANISTER SUCTION 3000ML PPV (SUCTIONS) ×1 IMPLANT
CORD BIPOLAR FORCEPS 12FT (ELECTRODE) ×1 IMPLANT
COVER PROBE W GEL 5X96 (DRAPES) ×1 IMPLANT
COVER SURGICAL LIGHT HANDLE (MISCELLANEOUS) ×1 IMPLANT
DERMABOND ADVANCED .7 DNX12 (GAUZE/BANDAGES/DRESSINGS) ×2 IMPLANT
DRAPE C-ARM 35X43 STRL (DRAPES) ×1 IMPLANT
DRAPE INCISE IOBAN 66X45 STRL (DRAPES) ×1 IMPLANT
DRAPE MICROSCOPE LEICA 54X105 (DRAPES) ×1 IMPLANT
DRSG TEGADERM 2-3/8X2-3/4 SM (GAUZE/BANDAGES/DRESSINGS) IMPLANT
DRSG TEGADERM 4X4.75 (GAUZE/BANDAGES/DRESSINGS) ×3 IMPLANT
ELECT COATED BLADE 2.86 ST (ELECTRODE) ×1 IMPLANT
ELECTRODE 4 CHANNEL SET (MISCELLANEOUS) IMPLANT
ELECTRODE EMG 18 NIMS (NEUROSURGERY SUPPLIES) ×1 IMPLANT
ELECTRODE REM PT RTRN 9FT ADLT (ELECTROSURGICAL) ×1 IMPLANT
FORCEPS BIPOLAR SPETZLER 8 1.0 (NEUROSURGERY SUPPLIES) IMPLANT
GAUZE 4X4 16PLY ~~LOC~~+RFID DBL (SPONGE) ×1 IMPLANT
GAUZE SPONGE 4X4 12PLY STRL (GAUZE/BANDAGES/DRESSINGS) ×1 IMPLANT
GENERATOR PULSE INSPIRE IV (Generator) IMPLANT
GLOVE BIO SURGEON STRL SZ 6 (GLOVE) ×1 IMPLANT
GLOVE BIO SURGEON STRL SZ7.5 (GLOVE) ×1 IMPLANT
GOWN STRL REUS W/ TWL LRG LVL3 (GOWN DISPOSABLE) ×3 IMPLANT
IV CATH 18GX1.25 SAFE RETR GRN (IV SOLUTION) IMPLANT
KIT BASIN OR (CUSTOM PROCEDURE TRAY) ×1 IMPLANT
KIT NEURO ACCESSORY W/WRENCH (MISCELLANEOUS) IMPLANT
KIT TURNOVER KIT B (KITS) ×1 IMPLANT
LEAD SENSING RESP INSPIRE IV (Lead) ×1 IMPLANT
LEAD SLEEP STIM INSPIRE IV/V (Lead) ×1 IMPLANT
LOOP VASCLR MAXI BLUE 18IN ST (MISCELLANEOUS) ×1 IMPLANT
MARKER SKIN DUAL TIP RULER LAB (MISCELLANEOUS) ×2 IMPLANT
NDL HYPO 25GX1X1/2 BEV (NEEDLE) ×1 IMPLANT
NEEDLE HYPO 25GX1X1/2 BEV (NEEDLE) ×1 IMPLANT
PAD ARMBOARD POSITIONER FOAM (MISCELLANEOUS) ×1 IMPLANT
PASSER CATH 38CM DISP (INSTRUMENTS) ×1 IMPLANT
PENCIL BUTTON HOLSTER BLD 10FT (ELECTRODE) ×1 IMPLANT
POSITIONER HEAD DONUT 9IN (MISCELLANEOUS) ×1 IMPLANT
PROBE NERVE STIMULATOR (NEUROSURGERY SUPPLIES) ×1 IMPLANT
REMOTE CONTROL SLEEP INSPIRE (MISCELLANEOUS) ×1 IMPLANT
RETRACTOR STAY HOOK 5MM (MISCELLANEOUS) ×1 IMPLANT
SET WALTER ACTIVATION W/DRAPE (SET/KITS/TRAYS/PACK) IMPLANT
SHEARS HARMONIC 9CM CVD (BLADE) ×1 IMPLANT
SOLN 0.9% NACL 1000 ML (IV SOLUTION) ×1 IMPLANT
SOLN 0.9% NACL POUR BTL 1000ML (IV SOLUTION) ×1 IMPLANT
SPONGE INTESTINAL PEANUT (DISPOSABLE) ×3 IMPLANT
SUT MNCRL AB 4-0 PS2 18 (SUTURE) ×2 IMPLANT
SUT SILK 2 0 SH CR/8 (SUTURE) IMPLANT
SUT SILK 2 0 TIES 10X30 (SUTURE) IMPLANT
SUT SILK 3 0 TIES 10X30 (SUTURE) IMPLANT
SUT SILK 3 0SH CR/8 30 (SUTURE) IMPLANT
SUT VIC AB 3-0 SH 27X BRD (SUTURE) ×2 IMPLANT
SUTURE SILK 3-0 RB1 30XBRD (SUTURE) ×2 IMPLANT
SUTURE SILK PEM 2-0 1X30 RB-1 (SUTURE) ×2 IMPLANT
SYR 10ML LL (SYRINGE) ×1 IMPLANT
TAPE CLOTH SURG 4X10 WHT LF (GAUZE/BANDAGES/DRESSINGS) ×1 IMPLANT
TOWEL GREEN STERILE (TOWEL DISPOSABLE) ×1 IMPLANT
TRAY ENT MC OR (CUSTOM PROCEDURE TRAY) ×1 IMPLANT
VASCULAR TIE MINI RED 18IN STL (MISCELLANEOUS) ×1 IMPLANT

## 2024-03-15 NOTE — Interval H&P Note (Signed)
 History and Physical Interval Note:  03/15/2024 12:03 PM  Dakota Ellison  has presented today for surgery, with the diagnosis of Obstructive sleep apnea.  The various methods of treatment have been discussed with the patient and family. After consideration of risks, benefits and other options for treatment, the patient has consented to  Procedure(s) with comments: INSERTION, HYPOGLOSSAL NERVE STIMULATOR (N/A) - Inspire V as a surgical intervention.  The patient's history has been reviewed, patient examined, no change in status, stable for surgery.  I have reviewed the patient's chart and labs.  Questions were answered to the patient's satisfaction.     [name]

## 2024-03-15 NOTE — Anesthesia Procedure Notes (Signed)
 Procedure Name: Intubation Date/Time: 03/15/2024 12:36 PM  Performed by: Sherlyn Lapine, CRNAPre-anesthesia Checklist: Patient identified, Emergency Drugs available, Suction available and Patient being monitored Patient Re-evaluated:Patient Re-evaluated prior to induction Oxygen Delivery Method: Circle System Utilized Preoxygenation: Pre-oxygenation with 100% oxygen Induction Type: IV induction Laryngoscope Size: Glidescope and 4 Grade View: Grade I Tube type: Oral Tube size: 7.5 mm Number of attempts: 1 Airway Equipment and Method: Stylet and Oral airway Placement Confirmation: ETT inserted through vocal cords under direct vision, positive ETCO2 and breath sounds checked- equal and bilateral Secured at: 23 cm Tube secured with: Tape Dental Injury: Teeth and Oropharynx as per pre-operative assessment

## 2024-03-15 NOTE — Transfer of Care (Signed)
 Immediate Anesthesia Transfer of Care Note  Patient: Dakota Ellison [facility]  Procedure(s) Performed: INSERTION, HYPOGLOSSAL NERVE STIMULATOR  Patient Location: PACU  Anesthesia Type:General  Level of Consciousness: drowsy  Airway & Oxygen Therapy: Patient Spontanous Breathing and Patient connected to nasal cannula oxygen  Post-op Assessment: Report given to RN and Post -op Vital signs reviewed and stable  Post vital signs: Reviewed and stable  Last Vitals:  Vitals Value Taken Time  BP 135/83 03/15/24 14:36  Temp 36.6 C 03/15/24 14:36  Pulse 81 03/15/24 14:40  Resp 22 03/15/24 14:40  SpO2 96 % 03/15/24 14:40  Vitals shown include unfiled device data.  Last Pain:  Vitals:   03/15/24 1436  TempSrc:   PainSc: 0-No pain         Complications: No notable events documented.

## 2024-03-15 NOTE — Op Note (Signed)
 Operative Report                                                            SURGEON: Elena Larry, MD  ASSISTANT: Waddell Collier, RNFA  PREOPERATIVE DIAGNOSIS: Obstructive sleep apnea. BMI of 37  POSTOPERATIVE DIAGNOSIS: Obstructive sleep apnea. BMI of 37  ANESTHESIA: General endotracheal.  ESTIMATED BLOOD LOSS: Less than 15 ml.  SPECIMENS: None.  DRAINS: None.  COMPLICATIONS: None.  Procedure: 12th cranial nerve (hypoglossal) stimulation implant along with placement of right pleural respiration sensor.  Electronic analysis of the implanted neurostimulator pulse generator system  Pre-Op Diagnosis: Moderate /Severe obstructive sleep apnea with positive airway pressure intolerance (ICD-10 G47.33). Post-Op Diagnosis: Moderate /Severe obstructive sleep apnea with positive pressure airway intolerance (ICD-10 G47.33).   Anesthesia: General endotracheal  Complications: None Brief Clinical History:     This is a **-year-old patient with a history of moderate /severe obstructive sleep apnea, who is intolerant and unable to achieve benefit from positive pressure therapy. Patient has passed the clinical, polysomnographic, and endoscopic screening criteria and presents today for the implant.    Procedure Description: The patient was brought to the Operating Room and was anesthetized via general endotracheal anesthesia without complication.  A shoulder roll was placed and the patient was prepped and draped in usual sterile fashion with the head turned to the left. Prior to prepping and draping, electrodes were placed in the genioglossus and styloglossus muscle and connected to the NIM box for intraoperative nerve monitoring.  A submental incision was made in the right upper neck approximately 2 cm below the mandible in the natural skin crease. Dissection was carried down through the subcutaneous tissue and platysma. The inferior border of the submandibular gland was identified as well  as the digastric tendon. The submandibular gland and the overlying fascia with the marginal mandibular nerve were retracted superiorly.  The digastric was retracted inferiorly.  Dissection was carried down into the digastric triangle where the hypoglossal nerve was identified in its usual fashion.   The mylohyoid muscle was retracted anteriorally, and the hypoglossal nerve was dissected up towards the floor of the mouth.  The lateral branches to retrusor muscles were identified, and tested intra-operatively using the NIM stimulator.  The cuff electrode for the hypoglossal nerve stimulator was placed distally to these branches on the medial nerve branch to the genioglossus muscle. Diagnostic evaluation confirmed activation of the genioglossus nerve, resulting in genioglossal activation and tongue protrusion, confirmed both visually and on NIM monitor. The stimulation electrode was then secured to the digastric tendon on its lateral surface with the provided anchor.  A second 5 cm incision was made in the right upper chest approximately 3 cm below the clavicle.  Dissection was carried down to the pectoralis muscle. An inferior pocket was created deep to the subcutaneous layer and superficial to the pectoralis major muscle.  In the upper portion of our chest pocket, pectoralis muscle fibers were then divided until we encountered anterior chest wall. External intercostal muscle was visualized and dissected until internal intercostal was visualized. Our respiratory sense lead was then tunneled in the fascial plane between external and internal intercostals. The distal anchor was secured to the anterior chest wall using 3-2.0 silk sutures and the proximal anchor was secured to the pec major facscia.  The stimulation lead was then tunneled in a subplatysmal plane and brought out into the sub-clavicular pocket.  Pulse generator was then brought onto the field and the sense and stimulation pins were both connected to the  appropriate headers and secured using a torque screwdriver. The implantable pulse generator was placed in the subclavicular pocket and secured loosely to the pectoralis fascia using non-resorbable sutures.    Diagnostic evaluation of the system was run, confirming good respiratory wave from the sensing lead, good anterior tongue protrusion with stimulation, and normal impedence measurements.  All the wounds were thoroughly irrigated with bacitracin irrigation.  The wounds were then closed in multiple layers with deep Vicryl sutures and a 5-0 biosyn.  Dermabond was then applied to the skin.    The patient was then awakened, extubated, and transferred to Recovery Room in stable condition. All counts correct x2.  I was present for and performed the entire procedure.  The RNFA assisted throughout the case. The RNFA was essential in retraction and counter traction when needed to make the case progress smoothly. This retraction and assistance made it possible to see the tissue plans for the procedure. The assistance was needed for blood control, tissue re-approximation and assisted with closure of the incision site

## 2024-03-15 NOTE — Anesthesia Postprocedure Evaluation (Signed)
 Anesthesia Post Note  Patient: Dakota Ellison  Procedure(s) Performed: INSERTION, HYPOGLOSSAL NERVE STIMULATOR     Patient location during evaluation: PACU Anesthesia Type: General Level of consciousness: awake and alert Pain management: pain level controlled Vital Signs Assessment: post-procedure vital signs reviewed and stable Respiratory status: spontaneous breathing, nonlabored ventilation and respiratory function stable Cardiovascular status: blood pressure returned to baseline and stable Postop Assessment: no apparent nausea or vomiting Anesthetic complications: no   No notable events documented.  Last Vitals:  Vitals:   03/15/24 1515 03/15/24 1530  BP: 126/89 (!) 143/86  Pulse: 70 72  Resp: 14 20  Temp:  36.7 C  SpO2: 94% 94%    Last Pain:  Vitals:   03/15/24 1527  TempSrc:   PainSc: 3    Pain Goal:                   [name]

## 2024-03-15 NOTE — Discharge Instructions (Signed)
INSPIRE POST-OPERATIVE INSTRUCTIONS:   Please review post-operative and recovery instructions below that you will need to be aware of after Inspire Implant surgery.  Please restart all of your home medications if you take anything on a daily basis.  You can resume regular diet after this procedure.  You will be scheduled for an appointment with Dr. Irene Pap to review details about surgery and to discuss the next steps.   DIET: Resume normal diet HYGIENE: Please wait until 48 hours after surgery before getting incisions on neck, chest, and torso wet. In the first 48 hours after surgery, will likely need to take sponge baths. WOUND CARE: Please leave pressure dressing on for 48 hours after surgery. Gently place antibiotic ointment over incisions 2 times per day; use clean q-tip. May place a clean bandage over incisions as needed. After 48 hours, you may get incisions wet with warm soap and water, but do not soak the incisions.  Pat area dry gently.  Immediately place antibiotic ointment. Take oral antibiotics as prescribed If skin around incision starts to get red (> 1cm), swollen, and/or more painful, please call the office ACTIVITY: Try to avoid sleeping on the side of your surgery, to the extent possible.   You may walk for exercise starting the day after surgery. For 2 weeks: Do not pick up anything greater than 5 pounds with the hand/arm that's on the same side as the surgery.  After 2 weeks, you may increase weight to 10 pounds.   Consider performing neck rolls 10 clockwise and 10 counterclockwise 3x/day. For 4 weeks, no strenuous activity (running, jogging, lifting weights, gardening, sports) or until cleared by physician.   PAIN MEDICATIONS: You will be prescribed Oxycodone for pain.   If pain is not severe, consider taking Tylenol 650mg  every 6 hours Avoid aspirin for 7 days after surgery Avoid direct heat (such as heating pads) to incision sites.   May gently place ice over  surgery sites as needed.  Please place a thin clean towel over skin first and then place ice bag over towel.  Ice for 10 minutes at a time only.  POST-OPERATIVE CLINIC APPOINTMENTS: 1 week: suture removal and wound check in the office.  1 month: device activation and wound check in the office. 2.5 months: check in visit to assess usage. 3-4 months: titration sleep study based on usage of >4 hrs/night.  4 months: final wound check in the office.  Yearly: device check at office.  SCAR CARE: After incisions have healed, you will have a scar, which will continue to evolve over the course of 12 months.  Caring for your incision scars will help them to be as minimal as possible. If you are out in the sun with incision exposure, please remember to place sunscreen over the incision and surrounding skin.   You may use vitamin E or "Scar ointment/cream" to help soften scar.  Please wait one month after surgery before starting this.

## 2024-03-18 ENCOUNTER — Encounter (HOSPITAL_COMMUNITY): Payer: Self-pay | Admitting: Otolaryngology

## 2024-03-19 NOTE — H&P (Signed)
 Dakota Ellison is an 73 y.o. male.    Chief Complaint:  OSA, CPAP intolerance  HPI: Patient presents today for planned elective procedure.  He/she denies any interval change in history since office visit on 02/09/24  Past Medical History:  Diagnosis Date   Anxiety state 04/08/2014   denies   Chronic back pain    Complication of anesthesia    and trouble waking up   Degenerative disc disease    Dysrhythmia    A-fib   Dysthymia    denies   ED (erectile dysfunction)    GERD (gastroesophageal reflux disease)    History of MRSA infection    on lip from cut   Hyperlipemia    Hypertension    take BP med for small leak in heart to keep it small per pt.   Mild asthma    as a teen   Morbid obesity (HCC)    OSA (obstructive sleep apnea)    Osteoarthritis    PAF (paroxysmal atrial fibrillation) (HCC) 2023   PONV (postoperative nausea and vomiting)    Rectal fissure    Testosterone  deficiency     Past Surgical History:  Procedure Laterality Date   DRUG INDUCED ENDOSCOPY N/A 12/25/2023   Procedure: DRUG INDUCED SLEEP ENDOSCOPY;  Surgeon: Okey Burns, MD;  Location: [facility];  Service: ENT;  Laterality: N/A;   HERNIA REPAIR     1998   IMPLANTATION OF HYPOGLOSSAL NERVE STIMULATOR N/A 03/15/2024   Procedure: INSERTION, HYPOGLOSSAL NERVE STIMULATOR;  Surgeon: Okey Burns, MD;  Location: [facility];  Service: ENT;  Laterality: N/A;  Inspire V   KNEE SURGERY  09/2010   left knee and right knee arthroscopy   OTHER SURGICAL HISTORY     arthroscopic subacromial decompression   OTHER SURGICAL HISTORY     open resection, distal right clavicle   SHOULDER ARTHROSCOPY     right   SPHINCTEROTOMY     TOTAL KNEE ARTHROPLASTY Left 06/11/2018   Procedure: LEFT TOTAL KNEE ARTHROPLASTY;  Surgeon: Melodi Lerner, MD;  Location: [facility];  Service: Orthopedics;  Laterality: Left;    TOTAL KNEE ARTHROPLASTY Right 12/17/2018   Procedure: TOTAL KNEE ARTHROPLASTY;  Surgeon: Melodi Lerner, MD;  Location: [facility];  Service: Orthopedics;  Laterality: Right;     Family History  Problem Relation Age of Onset   Pneumonia Brother     Social History:  reports that he quit smoking about 12 years ago. His smoking use included cigars. He has been exposed to tobacco smoke. He has never used smokeless tobacco. He reports that he does not currently use alcohol . He reports that he does not use drugs.  Allergies:  Allergies  Allergen Reactions   Atorvastatin  Other (See Comments)    Muscle weakness in his legs   Crestor  [Rosuvastatin ] Other (See Comments)    Severe muscle cramping   Zocor [Simvastatin] Other (See Comments)    Muscle weakness in his legs   Atorvastatin  Calcium  Other (See Comments)    No medications prior to admission.    No results found for this or any previous visit (from the past 48 hours). No results found.  ROS:  ROS: Constitutional: Negative for fever, weight loss and weight gain. Cardiovascular: Negative for chest pain and dyspnea on exertion. Respiratory: Is not experiencing shortness of breath at rest. Gastrointestinal: Negative for nausea and vomiting. Neurological: Negative for headaches. Psychiatric: The patient is not nervous/anxious   Blood pressure (!) 143/86, pulse 72,  temperature 98 F (36.7 C), resp. rate 20, height 6' (1.829 m), weight 124.7 kg, SpO2 94%.  PHYSICAL EXAM: Exam: General: Well-developed, well-nourished Respiratory Respiratory effort: Equal inspiration and expiration without stridor Cardiovascular Peripheral Vascular: Warm extremities with equal color/perfusion Eyes: No nystagmus with equal extraocular motion bilaterally Neuro/Psych/Balance: Patient oriented to person, place, and time; Appropriate mood and affect; Gait is intact with no imbalance; Cranial nerves I-XII are intact Head and Face Inspection: Normocephalic and atraumatic without mass or lesion Palpation: Facial skeleton intact without bony  stepoffs Salivary Glands: No mass or tenderness  Facial Strength: Facial motility symmetric and full bilaterally ENT Pinna: External ear intact and fully developed External canal: Canal is patent with intact skin Tympanic Membrane: Clear and mobile External Nose: No scar or anatomic deformity Internal Nose: Septum is relatively straight on anterior rhinoscopy. Bilateral inferior turbinate hypertrophy.  Lips, Teeth, and gums: Mucosa and teeth intact and viable Oral cavity/oropharynx: No erythema or exudate, no lesions present Neck Neck and Trachea: Midline trachea without mass or lesion Thyroid : No mass or nodularity Lymphatics: No lymphadenopathy    Assessment/Plan OSA CPAP intolerance   - risks and benefits discussed and he would like to proceed with Inspire implant surgery    [name] 03/19/2024, 11:30 AM

## 2024-03-20 ENCOUNTER — Other Ambulatory Visit (INDEPENDENT_AMBULATORY_CARE_PROVIDER_SITE_OTHER): Payer: Self-pay | Admitting: Otolaryngology

## 2024-03-20 ENCOUNTER — Telehealth (INDEPENDENT_AMBULATORY_CARE_PROVIDER_SITE_OTHER): Payer: Self-pay | Admitting: Otolaryngology

## 2024-03-20 ENCOUNTER — Telehealth (INDEPENDENT_AMBULATORY_CARE_PROVIDER_SITE_OTHER): Payer: Self-pay

## 2024-03-20 MED ORDER — DOXYCYCLINE HYCLATE 100 MG PO TABS: 100.0000 mg | ORAL_TABLET | Freq: Two times a day (BID) | ORAL | 0 refills | Status: AC

## 2024-03-20 NOTE — Telephone Encounter (Signed)
 Patient Dakota Ellison stating that his antibiotics from surgery are not agreeing with his stomach. Patient wants to know if Dr. Soldatova can prescribe different antibiotics. Please advise.

## 2024-03-20 NOTE — Telephone Encounter (Signed)
 The patient called in requesting a change in antibiotic.  He says that he reacted badly to the Augmentin  and has stopped that medication.  He is requesting that we call in a different antibiotic to [facility] please.

## 2024-03-20 NOTE — Telephone Encounter (Signed)
 Let patient know that we sent in a new prescription.

## 2024-03-25 ENCOUNTER — Ambulatory Visit (INDEPENDENT_AMBULATORY_CARE_PROVIDER_SITE_OTHER): Admitting: Otolaryngology

## 2024-03-25 ENCOUNTER — Encounter (INDEPENDENT_AMBULATORY_CARE_PROVIDER_SITE_OTHER): Payer: Self-pay | Admitting: Otolaryngology

## 2024-03-25 VITALS — BP 137/68 | HR 93 | Ht 72.0 in

## 2024-03-25 DIAGNOSIS — G4733 Obstructive sleep apnea (adult) (pediatric): Secondary | ICD-10-CM

## 2024-03-25 DIAGNOSIS — Z789 Other specified health status: Secondary | ICD-10-CM

## 2024-03-25 NOTE — Progress Notes (Signed)
 S/p Hypoglossal nerve stimulator placement on 03/15/2024 S: Doing well, pain well controlled; no fevers or issue with wound. No tongue weakness, no lip weakness. No SOB O: Tongue movement intact; no lower lip weakness; neck and chest incision c/d/I, no evidence of infection A/P: 73 y.o. y.o. w/ OSA s/p Hypoglossal nerve stimulator placement on 03/15/2024 Doing well, no evidence of infection Recommend continued gentle neck rolls F/u as needed

## 2024-03-27 ENCOUNTER — Encounter (INDEPENDENT_AMBULATORY_CARE_PROVIDER_SITE_OTHER): Admitting: Otolaryngology

## 2024-03-29 ENCOUNTER — Encounter (INDEPENDENT_AMBULATORY_CARE_PROVIDER_SITE_OTHER): Admitting: Otolaryngology

## 2024-04-06 ENCOUNTER — Other Ambulatory Visit: Payer: Self-pay | Admitting: Nurse Practitioner

## 2024-04-08 ENCOUNTER — Other Ambulatory Visit: Payer: Self-pay | Admitting: Nurse Practitioner

## 2024-04-08 NOTE — Telephone Encounter (Unsigned)
 Copied from CRM [id number]. Topic: Clinical - Medication Refill >> Apr 08, 2024 10:22 AM Larissa S wrote: Medication:  ALPRAZolam  (XANAX ) 0.5 MG tablet HYDROcodone -acetaminophen  (NORCO/VICODIN) 5-325 MG tablet  Has the patient contacted their pharmacy? Yes (Agent: If no, request that the patient contact the pharmacy for the refill. If patient does not wish to contact the pharmacy document the reason why and proceed with request.) (Agent: If yes, when and what did the pharmacy advise?)  This is the patient's preferred pharmacy:  [facility] - [place], [place] - [address] [place] [address] Phone: [phone] Fax: [phone]   Is this the correct pharmacy for this prescription? Yes If no, delete pharmacy and type the correct one.   Has the prescription been filled recently? No  Is the patient out of the medication? Yes  Has the patient been seen for an appointment in the last year OR does the patient have an upcoming appointment? Yes  Can we respond through MyChart? No  Agent: Please be advised that Rx refills may take up to 3 business days. We ask that you follow-up with your pharmacy.

## 2024-04-10 NOTE — Telephone Encounter (Signed)
 My understanding is that with letvak the pharmacy or the patient requested the medication monthly. I legally am not allowed to put refills on the pain medication

## 2024-04-15 DIAGNOSIS — M79604 Pain in right leg: Secondary | ICD-10-CM | POA: Diagnosis not present

## 2024-04-15 DIAGNOSIS — S71101A Unspecified open wound, right thigh, initial encounter: Secondary | ICD-10-CM | POA: Diagnosis not present

## 2024-04-22 DIAGNOSIS — M5416 Radiculopathy, lumbar region: Secondary | ICD-10-CM | POA: Diagnosis not present

## 2024-04-23 DIAGNOSIS — N410 Acute prostatitis: Secondary | ICD-10-CM | POA: Diagnosis not present

## 2024-04-26 ENCOUNTER — Ambulatory Visit (INDEPENDENT_AMBULATORY_CARE_PROVIDER_SITE_OTHER): Admitting: Nurse Practitioner

## 2024-04-26 ENCOUNTER — Other Ambulatory Visit: Payer: Self-pay | Admitting: Nurse Practitioner

## 2024-04-26 ENCOUNTER — Encounter: Payer: Self-pay | Admitting: Nurse Practitioner

## 2024-04-26 VITALS — BP 126/74 | HR 85 | Ht 72.0 in | Wt 287.0 lb

## 2024-04-26 DIAGNOSIS — Z6838 Body mass index (BMI) 38.0-38.9, adult: Secondary | ICD-10-CM

## 2024-04-26 DIAGNOSIS — E66812 Obesity, class 2: Secondary | ICD-10-CM

## 2024-04-26 DIAGNOSIS — Z4542 Encounter for adjustment and management of neuropacemaker (brain) (peripheral nerve) (spinal cord): Secondary | ICD-10-CM | POA: Diagnosis not present

## 2024-04-26 DIAGNOSIS — Z9682 Presence of neurostimulator: Secondary | ICD-10-CM

## 2024-04-26 DIAGNOSIS — G4733 Obstructive sleep apnea (adult) (pediatric): Secondary | ICD-10-CM | POA: Diagnosis not present

## 2024-04-26 NOTE — Telephone Encounter (Unsigned)
 Copied from CRM [id number]. Topic: Clinical - Medication Refill >> Apr 26, 2024  1:33 PM Burnard DEL wrote: Medication: valsartan -hydrochlorothiazide  (DIOVAN -HCT) 320-25 MG tablet  Has the patient contacted their pharmacy? Yes (Agent: If no, request that the patient contact the pharmacy for the refill. If patient does not wish to contact the pharmacy document the reason why and proceed with request.) (Agent: If yes, when and what did the pharmacy advise?)  This is the patient's preferred pharmacy:  [facility] - [place], [place] - [address] [place] [address] Phone: [phone] Fax: [phone]   Is this the correct pharmacy for this prescription? Yes If no, delete pharmacy and type the correct one.   Has the prescription been filled recently? No  Is the patient out of the medication? Yes  Has the patient been seen for an appointment in the last year OR does the patient have an upcoming appointment? Yes  Can we respond through MyChart? Yes  Agent: Please be advised that Rx refills may take up to 3 business days. We ask that you follow-up with your pharmacy.

## 2024-04-26 NOTE — Progress Notes (Signed)
 @Patient  ID: Dakota Ellison, male    DOB: Aug 08, 1950, 73 y.o.   MRN: [id number]  No chief complaint on file.   Referring provider: Wendee Lynwood HERO, NP  HPI: 73 year old male, former smoker followed for OSA intolerant of CPAP. He is a patient of Dr. Saundra and last seen in office 10/09/2023. Past medical history significant for HTN, PAF on Eliquis , asthma, GERD, mood disorder, ED, chronic pain.   TEST/EVENTS:  01/28/2024 PSG: AHI 35.1/h, SpO2 low 80%  10/09/2023: OV with Dr. Neysa. Intolerant of CPAP. Lost 30 lb over the past year through dietary changes, including elimination soft drinks and bread. Wants Inspire since he lost weight.   04/26/2024: Today - Inspire Activation  Discussed the use of AI scribe software for clinical note transcription with the patient, who gave verbal consent to proceed.  History of Present Illness Dakota Ellison is a 73 year old male with sleep apnea who presents for activation of his Inspire device.  He underwent surgery for the implantation of an Inspire device to manage his sleep apnea on 03/15/2024 with Dr. Soldatova. No complications post op. He denies pain or discomfort following the surgery, and reports that swallowing and speech are normal.  He confirms that he typically falls asleep within thirty minutes and sleeps between 7-8 hours. Feels tired all the time. Sleep does not feel restful. No drowsy driving or sleep parasomnias.     Allergies  Allergen Reactions   Atorvastatin  Other (See Comments)    Muscle weakness in his legs   Crestor  [Rosuvastatin ] Other (See Comments)    Severe muscle cramping   Zocor [Simvastatin] Other (See Comments)    Muscle weakness in his legs   Atorvastatin  Calcium  Other (See Comments)    Immunization History  Administered Date(s) Administered   Fluad Quad(high Dose 65+) 02/28/2019, 05/11/2021   Fluad Trivalent(High Dose 65+) 02/13/2023   INFLUENZA, HIGH DOSE SEASONAL PF 04/09/2018, 02/12/2024   Influenza Split  03/25/2011, 03/23/2012, 03/18/2013   Influenza Whole 03/06/2009, 03/08/2010   Influenza,inj,Quad PF,6+ Mos 03/06/2014, 03/28/2016   Influenza-Unspecified 03/21/2015, 03/09/2017, 03/20/2020, 03/06/2022   PFIZER(Purple Top)SARS-COV-2 Vaccination 07/13/2019, 08/05/2019, 05/20/2020   Pneumococcal Conjugate-13 10/08/2015   Pneumococcal Polysaccharide-23 04/17/2017   Td 05/11/2021   Tdap 03/07/2011   Zoster Recombinant(Shingrix) 06/07/2021, 10/12/2021   Zoster, Live 10/23/2015    Past Medical History:  Diagnosis Date   Anxiety state 04/08/2014   denies   Chronic back pain    Complication of anesthesia    and trouble waking up   Degenerative disc disease    Dysrhythmia    A-fib   Dysthymia    denies   ED (erectile dysfunction)    GERD (gastroesophageal reflux disease)    History of MRSA infection    on lip from cut   Hyperlipemia    Hypertension    take BP med for small leak in heart to keep it small per pt.   Mild asthma    as a teen   Morbid obesity (HCC)    OSA (obstructive sleep apnea)    Osteoarthritis    PAF (paroxysmal atrial fibrillation) (HCC) 2023   PONV (postoperative nausea and vomiting)    Rectal fissure    Testosterone  deficiency     Tobacco History: Social History   Tobacco Use  Smoking Status Former   Types: Cigars   Quit date: 06/07/2011   Years since quitting: 12.8   Passive exposure: Past  Smokeless Tobacco Never  Tobacco Comments  CIGARS ONLY--QUIT 2013   Counseling given: Not Answered Tobacco comments: CIGARS ONLY--QUIT 2013   Outpatient Medications Prior to Visit  Medication Sig Dispense Refill   allopurinol (ZYLOPRIM) 100 MG tablet TAKE 1 TABLET BY MOUTH TWICE DAILY 60 tablet 11   ALPRAZolam  (XANAX ) 0.5 MG tablet TAKE 1 TABLET BY MOUTH 3 TIMES DAILY AS NEEDED 90 tablet 0   apixaban  (ELIQUIS ) 5 MG TABS tablet Take 1 tablet (5 mg total) by mouth 2 (two) times daily. 60 tablet 11   Cholecalciferol (VITAMIN D ) 50 MCG (2000 UT) CAPS Take 1  capsule by mouth daily.     clindamycin (CLEOCIN) 300 MG capsule Take 300 mg by mouth 3 (three) times daily.     ezetimibe  (ZETIA ) 10 MG tablet TAKE 1 TABLET BY MOUTH DAILY (Patient taking differently: Take 10 mg by mouth at bedtime.) 90 tablet 0   fluticasone  (FLONASE ) 50 MCG/ACT nasal spray Place 2 sprays into both nostrils 2 (two) times daily. (Patient taking differently: Place 2 sprays into both nostrils 2 (two) times daily as needed for allergies.) 16 g 6   Garlic 2000 MG CAPS Take 2,000 mg by mouth 2 (two) times daily.      HYDROcodone -acetaminophen  (NORCO/VICODIN) 5-325 MG tablet Take 1 tablet by mouth every 6 (six) hours as needed for moderate pain (pain score 4-6). 120 tablet 0   ibuprofen  (ADVIL ) 600 MG tablet Take 1 tablet (600 mg total) by mouth every 6 (six) hours. Please take every 6 hrs, and stagger this medication 3 hrs apart from Tylenol  30 tablet 0   levocetirizine (XYZAL  ALLERGY 24HR) 5 MG tablet Take 1 tablet (5 mg total) by mouth every evening. (Patient taking differently: Take 5 mg by mouth daily as needed for allergies.) 30 tablet 3   metoprolol  succinate (TOPROL -XL) 50 MG 24 hr tablet Take 1 tablet (50 mg total) by mouth daily. 90 tablet 3   Multiple Vitamin (MULTIVITAMIN WITH MINERALS) TABS tablet Take 1 tablet by mouth daily.     niacin 500 MG CR capsule Take 500 mg by mouth 2 (two) times daily.     Omega-3 Fatty Acids (FISH OIL) 1200 MG CPDR Take 1,200 mg by mouth daily.     omeprazole  (PRILOSEC) 20 MG capsule TAKE 1 CAPSULE BY MOUTH DAILY 90 capsule 0   oxyCODONE  (ROXICODONE ) 5 MG immediate release tablet Take 1 tablet (5 mg total) by mouth every 8 (eight) hours as needed. 20 tablet 0   sertraline  (ZOLOFT ) 25 MG tablet TAKE 1 TABLET BY MOUTH DAILY 90 tablet 0   sildenafil  (VIAGRA ) 100 MG tablet Take as directed (Patient taking differently: Take 100 mg by mouth daily as needed for erectile dysfunction. Take as directed) 10 tablet 5   SYSTANE ULTRA 0.4-0.3 % SOLN Place 1  drop into both eyes 4 (four) times daily as needed (dry/irritated eyes.).     tamsulosin (FLOMAX) 0.4 MG CAPS capsule Take 0.4 mg by mouth at bedtime.     testosterone  cypionate (DEPOTESTOSTERONE CYPIONATE) 200 MG/ML injection Inject 100 mg into the muscle every Saturday. Saturdays     Turmeric (QC TUMERIC COMPLEX PO) Take 500 mg by mouth daily.     valsartan -hydrochlorothiazide  (DIOVAN -HCT) 320-25 MG tablet TAKE 1 TABLET BY MOUTH DAILY 90 tablet 0   vitamin C (ASCORBIC ACID) 500 MG tablet Take 500 mg by mouth daily.     zinc gluconate 50 MG tablet Take 50 mg by mouth daily.     amoxicillin -clavulanate (AUGMENTIN ) 875-125 MG tablet Take 1 tablet  by mouth 2 (two) times daily. 20 tablet 0   No facility-administered medications prior to visit.     Review of Systems: as above     Physical Exam:  BP 126/74   Pulse 85   Ht 6' (1.829 m) Comment: per pt  Wt 287 lb (130.2 kg)   SpO2 98% Comment: on RA  BMI 38.92 kg/m   GEN: Pleasant, interactive, well-appearing; obese; in no acute distress HEENT:  Normocephalic and atraumatic. PERRLA. Sclera white. Nasal turbinates pink, moist and patent bilaterally. No rhinorrhea present. Oropharynx pink and moist, without exudate or edema. No lesions, ulcerations, or postnasal drip.  NECK:  Supple w/ fair ROM. No lymphadenopathy.  Mallampati IV CV: RRR, no m/r/g, no peripheral edema. Pulses intact, +2 bilaterally. No cyanosis, pallor or clubbing. PULMONARY:  Unlabored, regular breathing. Clear bilaterally A&P w/o wheezes/rales/rhonchi. No accessory muscle use.  GI: BS present and normoactive. Soft, non-tender to palpation.  MSK: No erythema, warmth or tenderness. Cap refil <2 sec all extrem.  Neuro: A/Ox3. No focal deficits noted.   Skin: Warm, no lesions or rashe Psych: Normal affect and behavior. Judgement and thought content appropriate.     Lab Results:  CBC    Component Value Date/Time   WBC 13.3 (H) 11/20/2023 0919   RBC 5.33 11/20/2023  0919   HGB 16.3 03/15/2024 1137   HCT 48.0 03/15/2024 1137   PLT 211 11/20/2023 0919   MCV 91.9 11/20/2023 0919   MCH 29.8 11/20/2023 0919   MCHC 32.4 11/20/2023 0919   RDW 14.1 11/20/2023 0919   LYMPHSABS 1.7 01/19/2022 1457   MONOABS 0.6 01/19/2022 1457   EOSABS 0.1 01/19/2022 1457   BASOSABS 0.1 01/19/2022 1457    BMET    Component Value Date/Time   NA 141 03/15/2024 1137   K 4.0 03/15/2024 1137   CL 106 03/15/2024 1137   CO2 22 12/21/2023 0943   GLUCOSE 153 (H) 03/15/2024 1137   BUN 32 (H) 03/15/2024 1137   CREATININE 1.10 03/15/2024 1137   CREATININE 1.01 11/20/2023 0919   CALCIUM  9.4 12/21/2023 0943   GFRNONAA 59 (L) 12/21/2023 0943   GFRAA >60 02/04/2019 1203    BNP No results found for: BNP   Imaging:  No results found.  Administration History     None           No data to display          No results found for: NITRICOXIDE      Assessment & Plan:   OSA (obstructive sleep apnea) Severe OSA, intolerant of PAP therapy.  03/15/2024 Inspire implantation with Dr. Soldatova  04/26/2024 Inspire activated today   We discussed how untreated sleep apnea puts an individual at risk for cardiac arrhthymias, pulm HTN, DM, stroke and increases their risk for daytime accidents. We also briefly reviewed treatment options including weight loss, side sleeping position, oral appliance, CPAP therapy or referral to ENT for possible surgical options. Safe driving practices reviewed.   Hypoglossal nerve stimulator was assessed today.  Incision sites appear good, tongue protrusion was examined. He denies any discomfort.  Device was set to conventional configuration+/-/+ Programming :  1.  Stimulation level : Sensation 0.5 V , functional level 0.5 V lower limit, upper limit 1.5 V.  He was set at level 1 (0.5V) 2.  Start delay 30 minutes, pause time 15 minutes, duration 8 hours 3.  Sensing waveform was analyzed for 3 minutes 4.  Sleep remote education was  provided patient demonstrated  competency with the remote and was aware of patient Instruction videos and sleep remote guide 5.  Patient was instructed to step up levels by 1 level (0.1 V ) every week  6.  Check-in visit in presleep study in weeks to ensure that they are stepping up levels, using therapy  all night, every night and to evaluate subjective benefit.   7.  Inspire titration sleep study will be scheduled 2 to 4 weeks after the prestudy visit to verify efficacy of device  Patient Instructions  We turned on your Inspire today.   Start using your Inspire device nightly. You will step up your levels once a week by 1 level. Your step up day will be Fridays moving forward. If you develop any discomfort/soreness/pain, step back down by 1 level, stay there for a week and then increase by 1 level again and see if it's better. Always double check your levels on the back of your remote to make sure you're on the level you should be    Review your remote teaching and your app    We discussed how untreated sleep apnea puts an individual at risk for cardiac arrhthymias, pulm HTN, DM, stroke and increases their risk for daytime accidents   Use caution when driving and pull over if you become sleepy    Follow up 1/9 at 11 am with Tammy Parrett,NP, or sooner, if needed    Class 2 obesity with body mass index (BMI) of 38.0 to 38.9 in adult BMI 38. Healthy weight management encouraged. Aware of correlation between OSA and obesity.   Advised if symptoms do not improve or worsen, to please contact office for sooner follow up or seek emergency care.   I spent 45 minutes of dedicated to the care of this patient on the date of this encounter to include pre-visit review of records, face-to-face time with the patient discussing conditions above, post visit ordering of testing, clinical documentation with the electronic health record, making appropriate referrals as documented, and communicating necessary  findings to members of the patients care team.  Comer LULLA Rouleau, NP 04/26/2024  Pt aware and understands NP's role.

## 2024-04-26 NOTE — Patient Instructions (Addendum)
 We turned on your [place] today.   Start using your Inspire device nightly. You will step up your levels once a week by 1 level. Your step up day will be Fridays moving forward. If you develop any discomfort/soreness/pain, step back down by 1 level, stay there for a week and then increase by 1 level again and see if it's better. Always double check your levels on the back of your remote to make sure you're on the level you should be    Review your remote teaching and your app    We discussed how untreated sleep apnea puts an individual at risk for cardiac arrhthymias, pulm HTN, DM, stroke and increases their risk for daytime accidents   Use caution when driving and pull over if you become sleepy    Follow up 1/9 at 11 am with Tammy Parrett,NP, or sooner, if needed

## 2024-04-26 NOTE — Assessment & Plan Note (Signed)
 BMI 38. Healthy weight management encouraged. Aware of correlation between OSA and obesity.

## 2024-04-26 NOTE — Assessment & Plan Note (Addendum)
 Severe OSA, intolerant of PAP therapy.  03/15/2024 Inspire implantation with Dr. Soldatova  04/26/2024 Inspire activated today   We discussed how untreated sleep apnea puts an individual at risk for cardiac arrhthymias, pulm HTN, DM, stroke and increases their risk for daytime accidents. We also briefly reviewed treatment options including weight loss, side sleeping position, oral appliance, CPAP therapy or referral to ENT for possible surgical options. Safe driving practices reviewed.   Hypoglossal nerve stimulator was assessed today.  Incision sites appear good, tongue protrusion was examined. He denies any discomfort.  Device was set to conventional configuration+/-/+ Programming :  1.  Stimulation level : Sensation 0.5 V , functional level 0.5 V lower limit, upper limit 1.5 V.  He was set at level 1 (0.5V) 2.  Start delay 30 minutes, pause time 15 minutes, duration 8 hours 3.  Sensing waveform was analyzed for 3 minutes 4.  Sleep remote education was provided patient demonstrated competency with the remote and was aware of patient Instruction videos and sleep remote guide 5.  Patient was instructed to step up levels by 1 level (0.1 V ) every week  6.  Check-in visit in presleep study in weeks to ensure that they are stepping up levels, using therapy  all night, every night and to evaluate subjective benefit.   7.  Inspire titration sleep study will be scheduled 2 to 4 weeks after the prestudy visit to verify efficacy of device  Patient Instructions  We turned on your Inspire today.   Start using your Inspire device nightly. You will step up your levels once a week by 1 level. Your step up day will be Fridays moving forward. If you develop any discomfort/soreness/pain, step back down by 1 level, stay there for a week and then increase by 1 level again and see if it's better. Always double check your levels on the back of your remote to make sure you're on the level you should be    Review  your remote teaching and your app    We discussed how untreated sleep apnea puts an individual at risk for cardiac arrhthymias, pulm HTN, DM, stroke and increases their risk for daytime accidents   Use caution when driving and pull over if you become sleepy    Follow up 1/9 at 11 am with Tammy Parrett,NP, or sooner, if needed

## 2024-05-01 ENCOUNTER — Other Ambulatory Visit: Payer: Self-pay | Admitting: Nurse Practitioner

## 2024-05-01 NOTE — Telephone Encounter (Signed)
 Copied from CRM [id number]. Topic: Clinical - Medication Refill >> May 01, 2024  9:19 AM Franky GRADE wrote: Medication: omeprazole  (PRILOSEC) 20 MG capsule [[id number]],$MzfnczAzqnmzIZPI_ZpZPLKthMPLpfYogOAMlHLpzJVHlwrdl$$MzfnczAzqnmzIZPI_ZpZPLKthMPLpfYogOAMlHLpzJVHlwrdl$  (ZETIA ) 10 MG tablet [[id number]],sertraline  (ZOLOFT ) 25 MG tablet [[id number]] & metoprolol  succinate (TOPROL -XL) 50 MG 24 hr tablet [[id number]]  Has the patient contacted their pharmacy? Yes, they asked patient to contact the office as he is leaving out of town tomorrow and needs the refill urgently.  (Agent: If no, request that the patient contact the pharmacy for the refill. If patient does not wish to contact the pharmacy document the reason why and proceed with request.) (Agent: If yes, when and what did the pharmacy advise?)  This is the patient's preferred pharmacy:  [facility] - [place], [place] - [address] [place] [address] Phone: [phone] Fax: [phone]    Is this the correct pharmacy for this prescription? No If no, delete pharmacy and type the correct one.   Has the prescription been filled recently? No  Is the patient out of the medication? Yes  Has the patient been seen for an appointment in the last year OR does the patient have an upcoming appointment? Yes  Can we respond through MyChart? Yes  Agent: Please be advised that Rx refills may take up to 3 business days. We ask that you follow-up with your pharmacy.

## 2024-05-01 NOTE — Telephone Encounter (Signed)
 LAST APPOINTMENT DATE: 04/26/2024   NEXT APPOINTMENT DATE: 05/06/2024   Sertraline   LAST REFILL: 01/26/24  QTY: #90, 0RF  Metoprolol   LAST REFILL : 02/08/23 QTY: #90, 3RF

## 2024-05-03 MED ORDER — METOPROLOL SUCCINATE ER 50 MG PO TB24: 50.0000 mg | ORAL_TABLET | Freq: Every day | ORAL | 2 refills | Status: AC

## 2024-05-03 MED ORDER — OMEPRAZOLE 20 MG PO CPDR: 20.0000 mg | DELAYED_RELEASE_CAPSULE | Freq: Every day | ORAL | 1 refills | Status: AC

## 2024-05-03 MED ORDER — EZETIMIBE 10 MG PO TABS: 10.0000 mg | ORAL_TABLET | Freq: Every day | ORAL | 1 refills | Status: AC

## 2024-05-03 MED ORDER — SERTRALINE HCL 25 MG PO TABS: 25.0000 mg | ORAL_TABLET | Freq: Every day | ORAL | 1 refills | Status: AC

## 2024-05-06 ENCOUNTER — Ambulatory Visit: Admitting: Nurse Practitioner

## 2024-05-06 VITALS — BP 124/80 | HR 87 | Temp 98.2°F | Ht 72.0 in | Wt 284.4 lb

## 2024-05-06 DIAGNOSIS — I10 Essential (primary) hypertension: Secondary | ICD-10-CM | POA: Diagnosis not present

## 2024-05-06 DIAGNOSIS — Z7985 Long-term (current) use of injectable non-insulin antidiabetic drugs: Secondary | ICD-10-CM

## 2024-05-06 DIAGNOSIS — E1165 Type 2 diabetes mellitus with hyperglycemia: Secondary | ICD-10-CM

## 2024-05-06 DIAGNOSIS — Z23 Encounter for immunization: Secondary | ICD-10-CM | POA: Diagnosis not present

## 2024-05-06 DIAGNOSIS — R600 Localized edema: Secondary | ICD-10-CM

## 2024-05-06 LAB — POCT GLYCOSYLATED HEMOGLOBIN (HGB A1C): Hemoglobin A1C: 10.6 % — AB (ref 4.0–5.6)

## 2024-05-06 MED ORDER — BLOOD GLUCOSE MONITORING SUPPL DEVI: 1.0000 | 0 refills | Status: AC

## 2024-05-06 MED ORDER — LANCETS MISC: 1.0000 | Freq: Every day | 0 refills | Status: AC

## 2024-05-06 MED ORDER — OZEMPIC (0.25 OR 0.5 MG/DOSE) 2 MG/3ML ~~LOC~~ SOPN: 0.2500 mg | PEN_INJECTOR | SUBCUTANEOUS | 0 refills | Status: DC

## 2024-05-06 MED ORDER — LANCET DEVICE MISC: 1.0000 | 0 refills | Status: AC

## 2024-05-06 MED ORDER — BLOOD GLUCOSE TEST VI STRP: 1.0000 | ORAL_STRIP | Freq: Every day | 0 refills | Status: AC

## 2024-05-06 MED ORDER — DOXYCYCLINE HYCLATE 100 MG PO TABS: 100.0000 mg | ORAL_TABLET | Freq: Two times a day (BID) | ORAL | 0 refills | Status: AC

## 2024-05-06 NOTE — Patient Instructions (Signed)
 Nice to see you today  I will be in touch with the labs once I have them  Follow up with me in 3 months  When you get the ozempic  call and make a nurse visit. Bring your ozempic  with you and you can give it with a nurse at that visit  We did update your pneumonia vaccine today

## 2024-05-06 NOTE — Progress Notes (Signed)
 Established Patient Office Visit  Subjective   Patient ID: Dakota Ellison, male    DOB: [birth year]  Age: 73 y.o. MRN: [id number]  Chief Complaint  Patient presents with   Follow-up    Pt complains of noticing swelling in feet after being put on meloxicam .     HPI   Discussed the use of AI scribe software for clinical note transcription with the patient, who gave verbal consent to proceed.  History of Present Illness Dakota Ellison is a 73 year old male with diabetes who presents for re-evaluation of his diabetes management.  His last A1c in June was 6.9, but recent testing shows an increase to 10.6. He has not made any significant dietary changes recently, although he mentions eating heavily due to the holidays. He has not previously consulted a nutritionist or dietitian.  He experiences frequent urination and was diagnosed with prostatitis by his urologist a few weeks ago. He was prescribed Flomax and meloxicam  for this condition.  He notes swelling in his feet, particularly the left, which he first noticed two to three days ago after traveling to [place]. He drove for the trip and has a history of frequent driving. No shortness of breath or changes in his sleeping position or quality.  He experienced a wound after attending a ball game in [place] three weeks ago, resulting in a friction wound on his leg. He was treated at an urgent care with mupirocin . Additional lesions have appeared after the initial one, and he is concerned about his healing progress.  He has a history of neuropathy and reports decreased sensation in his feet, which he attributes to his neuropathy. He also mentions a decline in his balance, which he associates with his neuropathy.  He is currently taking Flomax, meloxicam , and Eliquis  (apixaban ). He administers a weekly testosterone  injection and has recently started using an Inspire device for sleep apnea.    Review of Systems  Constitutional:  Negative  for chills and fever.  Respiratory:  Negative for shortness of breath.   Cardiovascular:  Negative for chest pain.  Gastrointestinal:  Negative for abdominal pain and constipation.      Objective:     BP 124/80   Pulse 87   Temp 98.2 F (36.8 C) (Oral)   Ht 6' (1.829 m)   Wt 284 lb 6.4 oz (129 kg)   SpO2 92%   BMI 38.57 kg/m  BP Readings from Last 3 Encounters:  05/06/24 124/80  04/26/24 126/74  03/25/24 137/68   Wt Readings from Last 3 Encounters:  05/06/24 284 lb 6.4 oz (129 kg)  04/26/24 287 lb (130.2 kg)  03/15/24 275 lb (124.7 kg)   SpO2 Readings from Last 3 Encounters:  05/06/24 92%  04/26/24 98%  03/25/24 93%      Physical Exam Vitals and nursing note reviewed.  Constitutional:      Appearance: Normal appearance.  Cardiovascular:     Rate and Rhythm: Normal rate and regular rhythm.     Heart sounds: Normal heart sounds.  Pulmonary:     Effort: Pulmonary effort is normal.     Breath sounds: Normal breath sounds.  Abdominal:     General: Bowel sounds are normal.  Musculoskeletal:     Right lower leg: Edema present.     Left lower leg: Edema (L>R) present.  Skin:        Comments: Well circumscribed areas. Scabs with surrounding erythema and induration. No discharge   Neurological:  Mental Status: He is alert.    Title   Diabetic Foot Exam - detailed Is there a history of foot ulcer?: No Is there a foot ulcer now?: No Is there swelling?: Yes Is there elevated skin temperature?: No Is there abnormal foot shape?: No Is there a claw toe deformity?: No Are the toenails long?: No Are the toenails thick?: Yes Are the toenails ingrown?: No Pulse Foot Exam completed.: Yes   Right Posterior Tibialis: Present Left posterior Tibialis: Present   Right Dorsalis Pedis: Present Left Dorsalis Pedis: Present     Semmes-Weinstein Monofilament Test + means has sensation and - means no sensation      Image components are not supported.    Image components are not supported. Image components are not supported.  [place] Comments Right foot. Absent in 10, 1, 8, 9  Left foot. Absent 5, 6 7, 8, 9     Results for orders placed or performed in visit on 05/06/24  POCT glycosylated hemoglobin (Hb A1C)  Result Value Ref Range   Hemoglobin A1C 10.6 (A) 4.0 - 5.6 %   HbA1c POC (<> result, manual entry)     HbA1c, POC (prediabetic range)     HbA1c, POC (controlled diabetic range)        The 10-year ASCVD risk score (Arnett DK, et al., 2019) is: 37.1%    Assessment & Plan:   Problem List Items Addressed This Visit       Cardiovascular and Mediastinum   Essential hypertension   Relevant Orders   Brain natriuretic peptide   Other Visit Diagnoses       Uncontrolled type 2 diabetes mellitus with hyperglycemia (HCC)    -  Primary   Relevant Medications   Semaglutide ,0.25 or 0.5MG /DOS, (OZEMPIC , 0.25 OR 0.5 MG/DOSE,) 2 MG/3ML SOPN   Blood Glucose Monitoring Suppl DEVI   Glucose Blood (BLOOD GLUCOSE TEST STRIPS) STRP   Lancet Device MISC   Lancets MISC   Other Relevant Orders   POCT glycosylated hemoglobin (Hb A1C) (Completed)   CBC   Comprehensive metabolic panel with GFR   Microalbumin / creatinine urine ratio   Referral to Nutrition and Diabetes Services     Lower extremity edema       Relevant Orders   TSH   Brain natriuretic peptide     Need for pneumococcal 20-valent conjugate vaccination       Relevant Orders   Pneumococcal conjugate vaccine 20-valent (Prevnar 20) (Completed)     Assessment and Plan Assessment & Plan Type 2 diabetes mellitus with hyperglycemia and diabetic neuropathy A1c at 10.6 indicates poor control. Neuropathy with decreased foot sensation. Symptoms of increased urination and thirst due to hyperglycemia. Discussed Ozempic  for glycemic control and weight loss, dietary changes, and side effects. Emphasized A1c target under 7, ideally below 6.5. - Prescribed Ozempic , one dose  weekly for four weeks, then increase to 0.5mg  at refill. - Referred to nutritionist for dietary counseling. - Ordered blood work for diabetes monitoring. - Instructed to check blood sugar daily before breakfast. - Scheduled follow-up in three months for A1c reassessment.  Bilateral lower extremity edema Recent bilateral edema, left side more pronounced. Possible factors: travel, NSAID use. No respiratory symptoms. Cardiac causes considered, further evaluation needed. - Ordered blood work for cardiac evaluation. - Obtained urine sample for analysis. - Has not missed any doses of apixaban  per his report low likelihood of DVT  Skin wounds of lower extremity Friction wounds from clothing, initial healing with new  lesions. No infection signs, but prophylactic antibiotics considered due to Augmentin  reaction history. - Prescribed doxycycline  100 mg twice daily for one week. - Continue topical Mupirocin  cream.  General Health Maintenance Due for pneumonia vaccination. - Administered Prevnar 20 vaccine.   Return in about 3 months (around 08/04/2024) for DM recheck/Chronic med.    Adina Crandall, NP

## 2024-05-07 ENCOUNTER — Other Ambulatory Visit: Payer: Self-pay | Admitting: Nurse Practitioner

## 2024-05-07 ENCOUNTER — Other Ambulatory Visit (HOSPITAL_COMMUNITY): Payer: Self-pay

## 2024-05-07 ENCOUNTER — Telehealth: Payer: Self-pay

## 2024-05-07 LAB — COMPREHENSIVE METABOLIC PANEL WITH GFR
ALT: 36 U/L (ref 0–53)
AST: 27 U/L (ref 0–37)
Albumin: 4.5 g/dL (ref 3.5–5.2)
Alkaline Phosphatase: 77 U/L (ref 39–117)
BUN: 21 mg/dL (ref 6–23)
CO2: 29 meq/L (ref 19–32)
Calcium: 9.7 mg/dL (ref 8.4–10.5)
Chloride: 98 meq/L (ref 96–112)
Creatinine, Ser: 1.37 mg/dL (ref 0.40–1.50)
GFR: 51.49 mL/min — ABNORMAL LOW (ref >60.00)
Glucose, Bld: 242 mg/dL — ABNORMAL HIGH (ref 70–99)
Potassium: 3.8 meq/L (ref 3.5–5.1)
Sodium: 138 meq/L (ref 135–145)
Total Bilirubin: 1.1 mg/dL (ref 0.2–1.2)
Total Protein: 6.8 g/dL (ref 6.0–8.3)

## 2024-05-07 LAB — TSH: TSH: 1.45 u[IU]/mL (ref 0.35–5.50)

## 2024-05-07 LAB — CBC
HCT: 48.4 % (ref 39.0–52.0)
Hemoglobin: 16.6 g/dL (ref 13.0–17.0)
MCHC: 34.3 g/dL (ref 30.0–36.0)
MCV: 89.2 fl (ref 78.0–100.0)
Platelets: 188 K/uL (ref 150.0–400.0)
RBC: 5.43 Mil/uL (ref 4.22–5.81)
RDW: 13.9 % (ref 11.5–15.5)
WBC: 10.4 K/uL (ref 4.0–10.5)

## 2024-05-07 LAB — MICROALBUMIN / CREATININE URINE RATIO
Creatinine,U: 269 mg/dL
Microalb Creat Ratio: 41.2 mg/g — ABNORMAL HIGH (ref 0.0–30.0)
Microalb, Ur: 11.1 mg/dL — ABNORMAL HIGH (ref 0.0–1.9)

## 2024-05-07 LAB — BRAIN NATRIURETIC PEPTIDE: Pro B Natriuretic peptide (BNP): 39 pg/mL (ref 0.0–100.0)

## 2024-05-07 NOTE — Telephone Encounter (Signed)
 Pharmacy Patient Advocate Encounter   Received notification from Onbase that prior authorization for Ozempic  2 is required/requested.   Insurance verification completed.   The patient is insured through [facility].   Per test claim: PA required; PA submitted to above mentioned insurance via Latent Key/confirmation #/[id number] Status is pending

## 2024-05-07 NOTE — Telephone Encounter (Signed)
 Pharmacy Patient Advocate Encounter  Received notification from [facility] that Prior Authorization for Ozempic  2 has been APPROVED from 05/07/24 to 05/07/25. Ran test claim, Copay is $0.00. This test claim was processed through [facility]- copay amounts may vary at other pharmacies due to pharmacy/plan contracts, or as the patient moves through the different stages of their insurance plan.   PA #/Case ID/Reference #: # [id number]

## 2024-05-08 ENCOUNTER — Ambulatory Visit: Payer: Self-pay | Admitting: Nurse Practitioner

## 2024-05-08 ENCOUNTER — Telehealth: Payer: Self-pay | Admitting: *Deleted

## 2024-05-08 DIAGNOSIS — E1165 Type 2 diabetes mellitus with hyperglycemia: Secondary | ICD-10-CM

## 2024-05-08 MED ORDER — OZEMPIC (0.25 OR 0.5 MG/DOSE) 2 MG/3ML ~~LOC~~ SOPN: 0.2500 mg | PEN_INJECTOR | SUBCUTANEOUS | 0 refills | Status: DC

## 2024-05-08 NOTE — Addendum Note (Signed)
 Addended by: WENDEE LYNWOOD HERO on: 05/08/2024 12:34 PM   Modules accepted: Orders

## 2024-05-08 NOTE — Telephone Encounter (Signed)
 Copied from CRM [id number]. Topic: Clinical - Prescription Issue >> May 07, 2024  3:19 PM Dedra B wrote: Reason for CRM: Pt went to pick up prescription for Semaglutide ,0.25 or 0.5MG /DOS, (OZEMPIC , 0.25 OR 0.5 MG/DOSE,) 2 MG/3ML SOPN at [facility]. Pharmacist suggested that pt gets prescription filled at pharmacy where he gets his Eliquis  since they lose money by filling certain prescriptions. [facility]. Pls notify pt when sent.

## 2024-05-08 NOTE — Telephone Encounter (Signed)
 Copied from CRM [id number]. Topic: Clinical - Medication Question >> May 07, 2024  3:24 PM Dedra B wrote: Reason for CRM: Pt called to follow up on refill request for ALPRAZolam  (XANAX ) 0.5 MG tablet and HYDROcodone -acetaminophen  (NORCO/VICODIN) 5-325 MG tablet sent by [facility].

## 2024-05-08 NOTE — Telephone Encounter (Signed)
 Left detailed voicemail for patient to call/message the office back if there are any concerns.

## 2024-05-08 NOTE — Telephone Encounter (Signed)
 I have sent the prescription to the pharmacy requested. Can we let the patient know

## 2024-05-09 ENCOUNTER — Ambulatory Visit

## 2024-05-09 DIAGNOSIS — Z7985 Long-term (current) use of injectable non-insulin antidiabetic drugs: Secondary | ICD-10-CM | POA: Diagnosis not present

## 2024-05-09 DIAGNOSIS — E1140 Type 2 diabetes mellitus with diabetic neuropathy, unspecified: Secondary | ICD-10-CM | POA: Diagnosis not present

## 2024-05-09 NOTE — Progress Notes (Signed)
Patient in office today for teaching on Ozempic . Patient has brought medication with them to appointment. Printed off Instructions from [employer]. Patient reviewed instruction video on manufactures website Reviewed all instructions with patient and had repeat back to me. Instructed patient on proper cleaning of injection site and any signs of infection. Patient was able to administer medication with very little directions. Patient felt comfortable continuing in home setting. Reviewed with patient proper disposal of pen after use. Will call the office if any questions.

## 2024-05-14 ENCOUNTER — Telehealth: Payer: Self-pay | Admitting: Nurse Practitioner

## 2024-05-14 NOTE — Telephone Encounter (Unsigned)
 Copied from CRM [id number]. Topic: Clinical - Medication Refill >> May 14, 2024  4:48 PM Winona R wrote: Medication: allopurinol  (ZYLOPRIM ) 100 MG tablet- 90 day supply if possible   Has the patient contacted their pharmacy? Yes- instructed the pt to call for 90 day if that is what he wanted  (Agent: If no, request that the patient contact the pharmacy for the refill. If patient does not wish to contact the pharmacy document the reason why and proceed with request.) (Agent: If yes, when and what did the pharmacy advise?)  This is the patient's preferred pharmacy:  [facility] - [place], [place] - [address] [place] [address] Phone: [phone] Fax: [phone] Is this the correct pharmacy for this prescription? Yes If no, delete pharmacy and type the correct one.   Has the prescription been filled recently? Yes  Is the patient out of the medication? No but has two pills left   Has the patient been seen for an appointment in the last year OR does the patient have an upcoming appointment? Yes  Can we respond through MyChart? Yes  Agent: Please be advised that Rx refills may take up to 3 business days. We ask that you follow-up with your pharmacy.

## 2024-05-14 NOTE — Telephone Encounter (Unsigned)
 Copied from CRM [id number]. Topic: Referral - Question >> May 13, 2024 12:37 PM Taleah C wrote: Reason for CRM: pt called because he has not heard from the nutrition & diabetes practice he was referred too. He tried calling them but no one answers.   He mentioned that he was told that Dr. Geni Shutter specializes in nutrition and asked if a referral can be sent to her even though she is a PCP at [facility]. I explained that that;s not usually the case but pt asked if a message could be sent back for [facility] to look into this.   Please call patient back with an update.

## 2024-05-15 NOTE — Telephone Encounter (Signed)
 Can we call and see if we can get an answer from the referral  [facility] [address] [place] [address] [phone]  Patient cannot have two PCPs

## 2024-05-17 DIAGNOSIS — M5416 Radiculopathy, lumbar region: Secondary | ICD-10-CM | POA: Diagnosis not present

## 2024-05-17 MED ORDER — ALLOPURINOL 100 MG PO TABS: 100.0000 mg | ORAL_TABLET | Freq: Two times a day (BID) | ORAL | 1 refills | Status: AC

## 2024-05-17 NOTE — Telephone Encounter (Signed)
 Called pt  and office.  Office staff stated that they have attempted to reach out to patient several times.  States of having technical difficulties with their telephone line.  Called pt and relayed information. Pt states that he called and got no answer.  Gave pt phone number to call the office.  Mentioned to let us  know if he cannot reach the nutrition and diabetes practice.   Pt also complains of needing a 90 day supply for allopurinol instead of 30.  States he cannot go to the pharmacy often to pick up medication.

## 2024-05-17 NOTE — Telephone Encounter (Signed)
90 day script sent to pharmacy 

## 2024-05-27 ENCOUNTER — Encounter: Payer: Self-pay | Admitting: Dietician

## 2024-05-27 ENCOUNTER — Encounter: Admitting: Dietician

## 2024-05-27 VITALS — Ht 72.0 in | Wt 269.8 lb

## 2024-05-27 DIAGNOSIS — I10 Essential (primary) hypertension: Secondary | ICD-10-CM

## 2024-05-27 DIAGNOSIS — Z6837 Body mass index (BMI) 37.0-37.9, adult: Secondary | ICD-10-CM

## 2024-05-27 DIAGNOSIS — E119 Type 2 diabetes mellitus without complications: Secondary | ICD-10-CM

## 2024-05-27 DIAGNOSIS — E1165 Type 2 diabetes mellitus with hyperglycemia: Secondary | ICD-10-CM | POA: Insufficient documentation

## 2024-05-27 DIAGNOSIS — E785 Hyperlipidemia, unspecified: Secondary | ICD-10-CM

## 2024-05-27 DIAGNOSIS — Z713 Dietary counseling and surveillance: Secondary | ICD-10-CM | POA: Insufficient documentation

## 2024-05-27 NOTE — Patient Instructions (Addendum)
 Stop taking niacin for now, this can increase blood sugar. If blood sugar feels low, test it; if under 70, drink 4oz soda or juice, or eat 3-4 glucose tablets. Allow 10-15 minutes for symptoms to improve, then eat a meal or a balanced snack.  Eat a meal or snack every 3-5 hours during the day.  Goal for fasting blood sugars: 80-130; 2 hours after eating: 100-180

## 2024-05-28 ENCOUNTER — Other Ambulatory Visit: Payer: Self-pay | Admitting: Nurse Practitioner

## 2024-05-28 NOTE — Progress Notes (Signed)
 Diabetes Self-Management Education  Visit Type: First/Initial  Appt. Start Time: 1310 Appt. End Time: 1445  05/28/2024  [name], identified by name and date of birth, is a 73 y.o. male with a diagnosis of Diabetes: Type 2.   ASSESSMENT  Height 6' (1.829 m), weight 269 lb 12.8 oz (122.4 kg). Body mass index is 36.59 kg/m.   Diabetes Self-Management Education - 05/27/24 1332       Visit Information   Visit Type First/Initial      Initial Visit   Diabetes Type Type 2    Date Diagnosed 05/06/24    Are you currently following a meal plan? No    Are you taking your medications as prescribed? Yes      Health Coping   How would you rate your overall health? Fair      Psychosocial Assessment   Patient Belief/Attitude about Diabetes Afraid   potential complications; diet/ meal planning   What is the hardest part about your diabetes right now, causing you the most concern, or is the most worrisome to you about your diabetes?   Making healty food and beverage choices    Self-care barriers Other (comment)   neuropathy in hands   Patient Concerns Monitoring;Nutrition/Meal planning    Special Needs None    Learning Readiness Change in progress    How often do you need to have someone help you when you read instructions, pamphlets, or other written materials from your doctor or pharmacy? 1 - Never    What is the last grade level you completed in school? 12      Pre-Education Assessment   Patient understands the diabetes disease and treatment process. Needs Instruction    Patient understands incorporating nutritional management into lifestyle. Needs Instruction    Patient undertands incorporating physical activity into lifestyle. Needs Instruction    Patient understands using medications safely. Needs Review    Patient understands monitoring blood glucose, interpreting and using results Needs Review    Patient understands prevention, detection, and treatment of acute  complications. Needs Instruction    Patient understands prevention, detection, and treatment of chronic complications. Needs Instruction    Patient understands how to develop strategies to address psychosocial issues. Needs Instruction    Patient understands how to develop strategies to promote health/change behavior. Needs Review      Complications   Last HgB A1C per patient/outside source 10.6 %    How often do you check your blood sugar? 1-2 times/day    Fasting Blood glucose range (mg/dL) 29-870   899-883   Postprandial Blood glucose range (mg/dL) 869-820;29-870   899-859d   Number of hypoglycemic episodes per month 1    Can you tell when your blood sugar is low? Yes    What do you do if your blood sugar is low? ate a fresh apple; keeps glucose tablets    Have you had a dilated eye exam in the past 12 months? No    Have you had a dental exam in the past 12 months? Yes    Are you checking your feet? No      Dietary Intake   Breakfast 2 boiled eggs, 2pc toast; cereal, premier protein; egg, cheese, tomato    Lunch usually out when working -- bbq, pinto beans, coleslaw;    Snack (afternoon) orange/ apple    Dinner Cobb salad from [employer]; not much cooking; lives alone      Activity / Exercise   Activity / Exercise  Type ADL's      Patient Education   Previous Diabetes Education No    Disease Pathophysiology Definition of diabetes, type 1 and 2, and the diagnosis of diabetes;Explored patient's options for treatment of their diabetes;Other (comment)   diabetes remission, per patient question   Healthy Eating Role of diet in the treatment of diabetes and the relationship between the three main macronutrients and blood glucose level;Plate Method;Reviewed blood glucose goals for pre and post meals and how to evaluate the patients' food intake on their blood glucose level.;Meal timing in regards to the patients' current diabetes medication.;Meal options for control of blood glucose level and  chronic complications.    Being Active Role of exercise on diabetes management, blood pressure control and cardiac health.    Monitoring Identified appropriate SMBG and/or A1C goals.    Acute complications Taught prevention, symptoms, and  treatment of hypoglycemia - the 15 rule.;Discussed and identified patients' prevention, symptoms, and treatment of hyperglycemia.    Chronic complications Relationship between chronic complications and blood glucose control    Diabetes Stress and Support Role of stress on diabetes      Individualized Goals (developed by patient)   Nutrition General guidelines for healthy choices and portions discussed;Follow meal plan discussed    Problem Solving Eating Pattern      Outcomes   Expected Outcomes Demonstrated interest in learning. Expect positive outcomes    Future DMSE 4-6 wks          Individualized Plan for Diabetes Self-Management Training:   Learning Objective:  Patient will have a greater understanding of diabetes self-management. Patient education plan is to attend individual and/or group sessions per assessed needs and concerns.   Plan:   Patient Instructions  Stop taking niacin for now, this can increase blood sugar. If blood sugar feels low, test it; if under 70, drink 4oz soda or juice, or eat 3-4 glucose tablets. Allow 10-15 minutes for symptoms to improve, then eat a meal or a balanced snack.  Eat a meal or snack every 3-5 hours during the day.  Goal for fasting blood sugars: 80-130; 2 hours after eating: 100-180  Expected Outcomes:  Demonstrated interest in learning. Expect positive outcomes  Education material provided: ADA - How to Thrive: A Guide for Your Journey with Diabetes; plate planner with food lists; general meal planning guidelines; sample menus, snacking handout  If problems or questions, patient to contact team via:  Phone and patient portal  Future DSME appointment: 4-6 wks, 07/01/24

## 2024-05-29 ENCOUNTER — Other Ambulatory Visit: Payer: Self-pay | Admitting: Nurse Practitioner

## 2024-05-29 DIAGNOSIS — E1165 Type 2 diabetes mellitus with hyperglycemia: Secondary | ICD-10-CM

## 2024-05-29 NOTE — Telephone Encounter (Unsigned)
 Copied from CRM [id number]. Topic: Clinical - Medication Refill >> May 29, 2024  9:04 AM Burnard DEL wrote: Medication: Semaglutide ,0.25 or 0.5MG /DOS, (OZEMPIC , 0.25 OR 0.5 MG/DOSE,) 2 MG/3ML SOPN  Has the patient contacted their pharmacy? No (Agent: If no, request that the patient contact the pharmacy for the refill. If patient does not wish to contact the pharmacy document the reason why and proceed with request.) (Agent: If yes, when and what did the pharmacy advise?)  This is the patient's preferred pharmacy:    [id number] [place], [place] - [address] AT [facility] [address] [place] [address] Phone: [phone] Fax: [phone]  Is this the correct pharmacy for this prescription? Yes If no, delete pharmacy and type the correct one.   Has the prescription been filled recently? No  Is the patient out of the medication? Yes  Has the patient been seen for an appointment in the last year OR does the patient have an upcoming appointment? Yes  Can we respond through MyChart? Yes  Agent: Please be advised that Rx refills may take up to 3 business days. We ask that you follow-up with your pharmacy.

## 2024-05-31 MED ORDER — OZEMPIC (0.25 OR 0.5 MG/DOSE) 2 MG/3ML ~~LOC~~ SOPN: 0.5000 mg | PEN_INJECTOR | SUBCUTANEOUS | 1 refills | Status: DC

## 2024-06-05 ENCOUNTER — Telehealth: Payer: Self-pay

## 2024-06-05 NOTE — Telephone Encounter (Signed)
 Copied from CRM #[id number]. Topic: Clinical - Prescription Issue >> Jun 05, 2024  4:22 PM Alfonso ORN wrote: Reason for CRM: pt f/u on rx   .pharmacy stated Ozempic  out of stock so unable to take dose for week on time (Thursdays)  as advised and wanted to make provider aware. Please advise [phone]

## 2024-06-07 NOTE — Telephone Encounter (Signed)
 Noted if he goes two weeks without the medication please let me know

## 2024-06-11 ENCOUNTER — Other Ambulatory Visit: Payer: Self-pay | Admitting: Nurse Practitioner

## 2024-06-11 NOTE — Telephone Encounter (Unsigned)
 Copied from CRM [id number]. Topic: Clinical - Medication Refill >> Jun 11, 2024  2:36 PM Thersia C wrote: Medication: HYDROcodone -acetaminophen  (NORCO/VICODIN) 5-325 MG tablet -- new insurance company needs new prescription    Has the patient contacted their pharmacy? Yes (Agent: If no, request that the patient contact the pharmacy for the refill. If patient does not wish to contact the pharmacy document the reason why and proceed with request.) (Agent: If yes, when and what did the pharmacy advise?)  This is the patient's preferred pharmacy:  [facility] - [place], [place] - [address] [place] [address] Phone: [phone] Fax: [phone]   Is this the correct pharmacy for this prescription? Yes If no, delete pharmacy and type the correct one.   Has the prescription been filled recently? No  Is the patient out of the medication? Yes  Has the patient been seen for an appointment in the last year OR does the patient have an upcoming appointment? Yes  Can we respond through MyChart? Yes  Agent: Please be advised that Rx refills may take up to 3 business days. We ask that you follow-up with your pharmacy.

## 2024-06-12 MED ORDER — HYDROCODONE-ACETAMINOPHEN 5-325 MG PO TABS: 1.0000 | ORAL_TABLET | Freq: Four times a day (QID) | ORAL | 0 refills | Status: DC | PRN

## 2024-06-13 ENCOUNTER — Telehealth: Payer: Self-pay

## 2024-06-13 NOTE — Telephone Encounter (Signed)
 Copied from CRM [id number]. Topic: Appointments - Appointment Info/Confirmation >> Jun 13, 2024  3:52 PM Ismael A wrote: Patient/patient representative is calling for information regarding an appointment. I relayed message left by Duwaine Dear Mr. Twyford,   I left you a voicemail today regarding your Insp[employer] appointment tomorrow. If you have the app on your phone, please upload your Elizabeth data to the app. If you do not have the app, please download it prior to your appointment tomorrow.   Please remember to bring your remote with you!   Have a wonderful day!    Patient states he does not know how to upload inspire data to the app as he states he is not tech savvy , he would like to receive assistance with this at tomorrow's appt.   I verbally informed Megan, cma. NFN

## 2024-06-13 NOTE — Telephone Encounter (Signed)
 ATCx1, left detailed message per [facility] requesting patient to upload Inspire data and to download the app if he has not already. Also requested pt to bring remote to appt tomorrow, Sending MyChart message as well.

## 2024-06-14 ENCOUNTER — Encounter: Payer: Self-pay | Admitting: Adult Health

## 2024-06-14 ENCOUNTER — Ambulatory Visit: Admitting: Adult Health

## 2024-06-14 VITALS — BP 115/68 | HR 90 | Temp 97.7°F | Ht 72.0 in | Wt 265.8 lb

## 2024-06-14 DIAGNOSIS — Z9682 Presence of neurostimulator: Secondary | ICD-10-CM | POA: Diagnosis not present

## 2024-06-14 DIAGNOSIS — G4733 Obstructive sleep apnea (adult) (pediatric): Secondary | ICD-10-CM

## 2024-06-14 DIAGNOSIS — Z87891 Personal history of nicotine dependence: Secondary | ICD-10-CM | POA: Diagnosis not present

## 2024-06-14 NOTE — Progress Notes (Signed)
 "  @Patient  ID: Dakota Ellison, male    DOB: [birth year], 74 y.o.   MRN: [id number]  Chief Complaint  Patient presents with   Obstructive Sleep Apnea    INSPIRE    Referring provider: Wendee Lynwood HERO, NP  HPI: 74 year old male followed for obstructive sleep apnea CPAP intolerance status post hypoglossal nerve stimulator implantation Medical history significant for A-fib on Eliquis    TEST/EVENTS : Reviewed 06/14/2024  01/28/2024 PSG: AHI 35.1/h, SpO2 low 80%   03/15/2024 Inspire implantation with Dr. Okey  04/26/2024 Inspire activated  Stimulation level : Sensation 0.5 V , functional level 0.5 V lower limit, upper limit 1.5 V.  He was set at level 1 (0.5V) Start delay 30 minutes, pause time 15 minutes, duration 8 hours   06/14/2024 Follow up : OSA, INSPIRE  Patient returns for a follow-up visit for sleep apnea status post inspire implantation.  Patient has longstanding sleep apnea.  He was having difficulty tolerating CPAP with increase CPAP residuals and was referred for inspire evaluation.  He underwent inspire implantation on March 15, 2024.  He was seen last visit for inspire activation.  He tolerated well.  He says since last visit he is doing well using his inspire each night.  Inspire team is here today for the visit.  We had difficulty linking his remote and inspire phone app.  Patient is aware to contact the customer service team to walk through getting this linked.  Patient says he went up gradually each week 1 level and got up to level 5 (0.9 V).  Had difficulty tolerating went down to level 4 for several days and then ultimately went down to 3 for better comfort.  He has remained on level 3 and uses his inspire each night for about 8 hours.  Inspire stimulation test today with good tongue protrusion at level 3 4 and 5.  Patient tolerated well.  Patient was left on level 4 (0.8 V).  Waveform appeared normal on level 4.   Allergies[1]  Immunization History  Administered Date(s)  Administered   Fluad Quad(high Dose 65+) 02/28/2019, 05/11/2021   Fluad Trivalent(High Dose 65+) 02/13/2023   INFLUENZA, HIGH DOSE SEASONAL PF 04/09/2018, 02/12/2024   Influenza Split 03/25/2011, 03/23/2012, 03/18/2013   Influenza Whole 03/06/2009, 03/08/2010   Influenza,inj,Quad PF,6+ Mos 03/06/2014, 03/28/2016   Influenza-Unspecified 03/21/2015, 03/09/2017, 03/20/2020, 03/06/2022   PFIZER(Purple Top)SARS-COV-2 Vaccination 07/13/2019, 08/05/2019, 05/20/2020   PNEUMOCOCCAL CONJUGATE-20 05/06/2024   Pneumococcal Conjugate-13 10/08/2015   Pneumococcal Polysaccharide-23 04/17/2017   Td 05/11/2021   Tdap 03/07/2011   Zoster Recombinant(Shingrix) 06/07/2021, 10/12/2021   Zoster, Live 10/23/2015    Past Medical History:  Diagnosis Date   Anxiety state 04/08/2014   denies   Chronic back pain    Complication of anesthesia    and trouble waking up   Degenerative disc disease    Diabetes (HCC)    Dysrhythmia    A-fib   Dysthymia    denies   ED (erectile dysfunction)    GERD (gastroesophageal reflux disease)    History of MRSA infection    on lip from cut   Hyperlipemia    Hypertension    take BP med for small leak in heart to keep it small per pt.   Mild asthma    as a teen   Morbid obesity (HCC)    OSA (obstructive sleep apnea)    Osteoarthritis    PAF (paroxysmal atrial fibrillation) (HCC) 2023   PONV (postoperative nausea and vomiting)  Rectal fissure    Testosterone  deficiency     Tobacco History: Tobacco Use History[2] Counseling given: Not Answered Tobacco comments: CIGARS ONLY--QUIT 2013   Outpatient Medications Prior to Visit  Medication Sig Dispense Refill   allopurinol  (ZYLOPRIM ) 100 MG tablet Take 1 tablet (100 mg total) by mouth 2 (two) times daily. 180 tablet 1   ALPRAZolam  (XANAX ) 0.5 MG tablet TAKE 1 TABLET BY MOUTH 3 TIMES DAILY AS NEEDED 90 tablet 0   apixaban  (ELIQUIS ) 5 MG TABS tablet Take 1 tablet (5 mg total) by mouth 2 (two) times daily. 60  tablet 11   Blood Glucose Monitoring Suppl (ONETOUCH VERIO FLEX SYSTEM) w/Device KIT as directed.     Blood Glucose Monitoring Suppl DEVI 1 each by Does not apply route as directed. Dispense based on patient and insurance preference. Use up to four times daily as directed. (FOR ICD-10 E10.9, E11.9). 1 each 0   Cholecalciferol (VITAMIN D ) 50 MCG (2000 UT) CAPS Take 1 capsule by mouth daily.     ezetimibe  (ZETIA ) 10 MG tablet Take 1 tablet (10 mg total) by mouth daily. 90 tablet 1   fluticasone  (FLONASE ) 50 MCG/ACT nasal spray Place 2 sprays into both nostrils 2 (two) times daily. (Patient taking differently: Place 2 sprays into both nostrils 2 (two) times daily as needed.) 16 g 6   Glucose Blood (BLOOD GLUCOSE TEST STRIPS) STRP 1 each by Does not apply route daily. Dispense based on patient and insurance preference. Use up to four times daily as directed. (FOR ICD-10 E10.9, E11.9). 100 strip 0   HYDROcodone -acetaminophen  (NORCO/VICODIN) 5-325 MG tablet Take 1 tablet by mouth every 6 (six) hours as needed for moderate pain (pain score 4-6). 120 tablet 0   Lancet Device MISC 1 each by Does not apply route as directed. Dispense based on patient and insurance preference. Use up to four times daily as directed. (FOR ICD-10 E10.9, E11.9). 1 each 0   Lancets MISC 1 each by Does not apply route daily. Dispense based on patient and insurance preference. Use up to four times daily as directed. (FOR ICD-10 E10.9, E11.9). 100 each 0   levocetirizine (XYZAL  ALLERGY 24HR) 5 MG tablet Take 1 tablet (5 mg total) by mouth every evening. (Patient taking differently: Take 5 mg by mouth daily as needed for allergies.) 30 tablet 3   meloxicam  (MOBIC ) 15 MG tablet Take 15 mg by mouth daily.     metoprolol  succinate (TOPROL -XL) 50 MG 24 hr tablet Take 1 tablet (50 mg total) by mouth daily. 90 tablet 2   Multiple Vitamin (MULTIVITAMIN WITH MINERALS) TABS tablet Take 1 tablet by mouth daily.     mupirocin  ointment (BACTROBAN ) 2  % Apply topically daily. (Patient taking differently: Apply 1 Application topically daily as needed.)     Omega-3 Fatty Acids (FISH OIL) 1200 MG CPDR Take 1,200 mg by mouth daily.     omeprazole  (PRILOSEC) 20 MG capsule Take 1 capsule (20 mg total) by mouth daily. 90 capsule 1   ONETOUCH VERIO test strip SMARTSIG:Via Meter 1-4 Times Daily     oxyCODONE  (ROXICODONE ) 5 MG immediate release tablet Take 1 tablet (5 mg total) by mouth every 8 (eight) hours as needed. 20 tablet 0   Semaglutide ,0.25 or 0.5MG /DOS, (OZEMPIC , 0.25 OR 0.5 MG/DOSE,) 2 MG/3ML SOPN Inject 0.5 mg into the skin once a week. 3 mL 1   sertraline  (ZOLOFT ) 25 MG tablet Take 1 tablet (25 mg total) by mouth daily. 90 tablet 1  sildenafil  (VIAGRA ) 100 MG tablet Take as directed 10 tablet 5   SYSTANE ULTRA 0.4-0.3 % SOLN Place 1 drop into both eyes 4 (four) times daily as needed (dry/irritated eyes.).     tamsulosin (FLOMAX) 0.4 MG CAPS capsule Take 0.4 mg by mouth at bedtime.     testosterone  cypionate (DEPOTESTOSTERONE CYPIONATE) 200 MG/ML injection Inject 100 mg into the muscle every Saturday. Saturdays     triamcinolone  cream (KENALOG ) 0.1 % APPLY 1 APPLICATION TOPICALLY TWICE DAILY AS NEEDED     Turmeric (QC TUMERIC COMPLEX PO) Take 500 mg by mouth daily.     valsartan -hydrochlorothiazide  (DIOVAN -HCT) 320-25 MG tablet TAKE 1 TABLET BY MOUTH DAILY 90 tablet 1   vitamin C (ASCORBIC ACID) 500 MG tablet Take 500 mg by mouth daily.     zinc gluconate 50 MG tablet Take 50 mg by mouth daily.     clindamycin (CLEOCIN) 300 MG capsule Take 300 mg by mouth 3 (three) times daily. (Patient not taking: Reported on 06/14/2024)     collagenase (SANTYL) 250 UNIT/GM ointment APPLY 2.5CM DAILY TO WOUND (Patient not taking: Reported on 06/14/2024)     Garlic 2000 MG CAPS Take 2,000 mg by mouth 2 (two) times daily.  (Patient not taking: Reported on 06/14/2024)     ibuprofen  (ADVIL ) 600 MG tablet Take 1 tablet (600 mg total) by mouth every 6 (six) hours.  Please take every 6 hrs, and stagger this medication 3 hrs apart from Tylenol  (Patient not taking: Reported on 06/14/2024) 30 tablet 0   niacin 500 MG CR capsule Take 500 mg by mouth 2 (two) times daily. (Patient not taking: Reported on 06/14/2024)     predniSONE  (DELTASONE ) 20 MG tablet Take 40 mg by mouth daily with breakfast. (Patient not taking: Reported on 06/14/2024)     No facility-administered medications prior to visit.     Review of Systems:   Constitutional:   No  weight loss, night sweats,  Fevers, chills, fatigue, or  lassitude.  HEENT:   No headaches,  Difficulty swallowing,  Tooth/dental problems, or  Sore throat,                No sneezing, itching, ear ache, nasal congestion, post nasal drip,   CV:  No chest pain,  Orthopnea, PND, swelling in lower extremities, anasarca, dizziness, palpitations, syncope.   GI  No heartburn, indigestion, abdominal pain, nausea, vomiting, diarrhea, change in bowel habits, loss of appetite, bloody stools.   Resp: No shortness of breath with exertion or at rest.  No excess mucus, no productive cough,  No non-productive cough,  No coughing up of blood.  No change in color of mucus.  No wheezing.  No chest wall deformity  Skin: no rash or lesions.  GU: no dysuria, change in color of urine, no urgency or frequency.  No flank pain, no hematuria   MS:  No joint pain or swelling.  No decreased range of motion.  No back pain.    Physical Exam  BP 115/68   Pulse 90   Temp 97.7 F (36.5 C)   Ht 6' (1.829 m) Comment: Per pt  Wt 265 lb 12.8 oz (120.6 kg)   SpO2 96% Comment: RA  BMI 36.05 kg/m   GEN: A/Ox3; pleasant , NAD, well nourished    HEENT:  [place]/AT,   NOSE-clear, THROAT-clear, no lesions, no postnasal drip or exudate noted.   NECK:  Supple w/ fair ROM; no JVD; normal carotid impulses w/o bruits; no thyromegaly  or nodules palpated; no lymphadenopathy.    RESP  Clear  P & A; w/o, wheezes/ rales/ or rhonchi. no accessory muscle use, no  dullness to percussion Right surgical chest wall incision healed  CARD:  RRR, no m/r/g, no peripheral edema, pulses intact, no cyanosis or clubbing.  GI:   Soft & nt; nml bowel sounds; no organomegaly or masses detected.   Musco: Warm bil, no deformities or joint swelling noted.   Neuro: alert, no focal deficits noted.    Skin: Warm, no lesions or rashes    Lab Results:Reviewed 06/14/2024   CBC    BNP No results found for: BNP  ProBNP   Imaging: No results found.  Administration History     None           No data to display          No results found for: NITRICOXIDE      No data to display              Assessment & Plan:   Assessment and Plan Assessment & Plan OSA-CPAP intolerant s/p INSPIRE device  Hypoglossal nerve stimulator was assessed today.  Incision sites well healed, tongue protrusion was examined and normal . He denies any discomfort.    Programming :  1.  Stimulation level : Sensation 0.5 V , functional level 0.5 V lower limit, upper limit 1.5 V.  Level 3 on arrival changed to level 4 (0.8V) 2.  Continue delay 30 minutes, pause time 15 minutes, duration 8 hours 3.  Sensing waveform was analyzed for 3 minutes 4.  Sleep remote education was provided patient demonstrated competency with the remote and was aware of patient Instruction videos and sleep remote guide 5.  Patient was instructed to step up levels by 1 level (0.1 V ) every week as tolerated.  6.  Check-in visit in presleep study in 4 weeks to ensure that they are stepping up levels, using therapy  all night, every night and to evaluate subjective benefit.   7.  Inspire titration sleep study will be scheduled 2 to 4 weeks after the prestudy visit to verify efficacy of device    Demia Viera, NP 06/14/2024  I spent 31   minutes dedicated to the care of this patient on the date of this encounter to include pre-visit review of records, face-to-face time with the patient  discussing conditions above, post visit ordering of testing, clinical documentation with the electronic health record, making appropriate referrals as documented, and communicating necessary findings to members of the patients care team.      [1]  Allergies Allergen Reactions   Atorvastatin  Other (See Comments)    Muscle weakness in his legs   Crestor  [Rosuvastatin ] Other (See Comments)    Severe muscle cramping   Zocor [Simvastatin] Other (See Comments)    Muscle weakness in his legs   Atorvastatin  Calcium  Other (See Comments)  [2]  Social History Tobacco Use  Smoking Status Former   Types: Cigars   Quit date: 06/07/2011   Years since quitting: 13.0   Passive exposure: Past  Smokeless Tobacco Never  Tobacco Comments   CIGARS ONLY--QUIT 2013   "

## 2024-06-14 NOTE — Patient Instructions (Signed)
 Continue on Inspire device nightly. You will step up your levels once a week by 1 level as tolerated Currently on Level 4.  Always double check your levels on the back of your remote to make sure you're on the level you should be  Follow up in 4 weeks and As needed

## 2024-06-18 ENCOUNTER — Other Ambulatory Visit: Payer: Self-pay

## 2024-06-18 NOTE — Telephone Encounter (Signed)
 Last OV:05/06/24 Next OV: 07/29/24 Last fill Dr. Letvak 2024

## 2024-06-19 MED ORDER — APIXABAN 5 MG PO TABS: 5.0000 mg | ORAL_TABLET | Freq: Two times a day (BID) | ORAL | 11 refills | Status: AC

## 2024-06-21 ENCOUNTER — Ambulatory Visit: Payer: Self-pay

## 2024-06-21 ENCOUNTER — Encounter (HOSPITAL_BASED_OUTPATIENT_CLINIC_OR_DEPARTMENT_OTHER): Payer: Self-pay | Admitting: Emergency Medicine

## 2024-06-21 ENCOUNTER — Emergency Department (HOSPITAL_BASED_OUTPATIENT_CLINIC_OR_DEPARTMENT_OTHER): Admission: EM | Admit: 2024-06-21 | Discharge: 2024-06-22 | Disposition: A | Discharge status: Home / Self Care

## 2024-06-21 ENCOUNTER — Emergency Department (HOSPITAL_BASED_OUTPATIENT_CLINIC_OR_DEPARTMENT_OTHER)

## 2024-06-21 ENCOUNTER — Other Ambulatory Visit: Payer: Self-pay

## 2024-06-21 DIAGNOSIS — Z6835 Body mass index (BMI) 35.0-35.9, adult: Secondary | ICD-10-CM | POA: Diagnosis not present

## 2024-06-21 DIAGNOSIS — N400 Enlarged prostate without lower urinary tract symptoms: Secondary | ICD-10-CM | POA: Insufficient documentation

## 2024-06-21 DIAGNOSIS — R1084 Generalized abdominal pain: Secondary | ICD-10-CM | POA: Diagnosis not present

## 2024-06-21 DIAGNOSIS — Z7901 Long term (current) use of anticoagulants: Secondary | ICD-10-CM | POA: Diagnosis not present

## 2024-06-21 DIAGNOSIS — K760 Fatty (change of) liver, not elsewhere classified: Secondary | ICD-10-CM | POA: Insufficient documentation

## 2024-06-21 DIAGNOSIS — R197 Diarrhea, unspecified: Secondary | ICD-10-CM | POA: Diagnosis present

## 2024-06-21 DIAGNOSIS — E669 Obesity, unspecified: Secondary | ICD-10-CM | POA: Diagnosis not present

## 2024-06-21 DIAGNOSIS — N281 Cyst of kidney, acquired: Secondary | ICD-10-CM | POA: Diagnosis not present

## 2024-06-21 DIAGNOSIS — E119 Type 2 diabetes mellitus without complications: Secondary | ICD-10-CM | POA: Diagnosis not present

## 2024-06-21 DIAGNOSIS — R531 Weakness: Secondary | ICD-10-CM | POA: Diagnosis not present

## 2024-06-21 DIAGNOSIS — R112 Nausea with vomiting, unspecified: Secondary | ICD-10-CM | POA: Diagnosis not present

## 2024-06-21 DIAGNOSIS — K5730 Diverticulosis of large intestine without perforation or abscess without bleeding: Secondary | ICD-10-CM | POA: Diagnosis not present

## 2024-06-21 DIAGNOSIS — R5381 Other malaise: Secondary | ICD-10-CM | POA: Insufficient documentation

## 2024-06-21 LAB — CBC
HCT: 46.7 % (ref 39.0–52.0)
Hemoglobin: 16.1 g/dL (ref 13.0–17.0)
MCH: 30.8 pg (ref 26.0–34.0)
MCHC: 34.5 g/dL (ref 30.0–36.0)
MCV: 89.5 fL (ref 80.0–100.0)
Platelets: 270 K/uL (ref 150–400)
RBC: 5.22 MIL/uL (ref 4.22–5.81)
RDW: 14.6 % (ref 11.5–15.5)
WBC: 13.3 K/uL — ABNORMAL HIGH (ref 4.0–10.5)
nRBC: 0 % (ref 0.0–0.2)

## 2024-06-21 LAB — OCCULT BLOOD X 1 CARD TO LAB, STOOL: Fecal Occult Bld: POSITIVE — AB

## 2024-06-21 LAB — COMPREHENSIVE METABOLIC PANEL WITH GFR
ALT: 24 U/L (ref 0–44)
AST: 21 U/L (ref 15–41)
Albumin: 4.5 g/dL (ref 3.5–5.0)
Alkaline Phosphatase: 72 U/L (ref 38–126)
Anion gap: 13 (ref 5–15)
BUN: 72 mg/dL — ABNORMAL HIGH (ref 8–23)
CO2: 23 mmol/L (ref 22–32)
Calcium: 10 mg/dL (ref 8.9–10.3)
Chloride: 105 mmol/L (ref 98–111)
Creatinine, Ser: 1.75 mg/dL — ABNORMAL HIGH (ref 0.61–1.24)
GFR, Estimated: 41 mL/min — ABNORMAL LOW
Glucose, Bld: 111 mg/dL — ABNORMAL HIGH (ref 70–99)
Potassium: 4.5 mmol/L (ref 3.5–5.1)
Sodium: 141 mmol/L (ref 135–145)
Total Bilirubin: 0.5 mg/dL (ref 0.0–1.2)
Total Protein: 7.4 g/dL (ref 6.5–8.1)

## 2024-06-21 LAB — CBG MONITORING, ED: Glucose-Capillary: 109 mg/dL — ABNORMAL HIGH (ref 70–99)

## 2024-06-21 LAB — URINALYSIS, ROUTINE W REFLEX MICROSCOPIC
Bilirubin Urine: NEGATIVE
Glucose, UA: NEGATIVE mg/dL
Hgb urine dipstick: NEGATIVE
Ketones, ur: NEGATIVE mg/dL
Leukocytes,Ua: NEGATIVE
Nitrite: NEGATIVE
Protein, ur: NEGATIVE mg/dL
Specific Gravity, Urine: 1.02 (ref 1.005–1.030)
pH: 5 (ref 5.0–8.0)

## 2024-06-21 LAB — LIPASE, BLOOD: Lipase: 26 U/L (ref 11–51)

## 2024-06-21 MED ORDER — ONDANSETRON HCL 4 MG PO TABS: 4.0000 mg | ORAL_TABLET | Freq: Four times a day (QID) | ORAL | 0 refills | Status: AC

## 2024-06-21 MED ORDER — ONDANSETRON HCL 4 MG/2ML IJ SOLN: 4.0000 mg | Freq: Once | INTRAMUSCULAR | Status: DC | PRN

## 2024-06-21 MED ORDER — LACTATED RINGERS IV BOLUS
1000.0000 mL | Freq: Once | INTRAVENOUS | Status: AC
  Administered 2024-06-21: 1000 mL via INTRAVENOUS

## 2024-06-21 MED ORDER — IOHEXOL 300 MG/ML  SOLN
75.0000 mL | Freq: Once | INTRAMUSCULAR | Status: AC | PRN
  Administered 2024-06-21: 75 mL via INTRAVENOUS

## 2024-06-21 NOTE — Telephone Encounter (Signed)
 Triage RN called patient x2 in 1st attempt to reach.  Goes to voicemail each time.  Left voicemail requesting callback.     COPIED: Message from Deaijah H sent at 06/21/2024  1:19 PM EST  Reason for Triage: Patient called in stating Semaglutide ,0.25 or 0.5MG /DOS, (OZEMPIC , 0.25 OR 0.5 MG/DOSE,) 2 MG/3ML SOPN causing discomfort with stomach and diarrhea

## 2024-06-21 NOTE — Telephone Encounter (Signed)
 Can we see how long he has been on the 0.5mg  of ozempic  and see if his symptoms started then.  Also is he having heartburn and burping more than normal?

## 2024-06-21 NOTE — ED Triage Notes (Signed)
 Pt reports dx of diabetes 05/06/24.   C/o diarrhea, abd pain x 1.5 weeks. Reports increased weakness x 3 days.   Emesis x 1 today.

## 2024-06-21 NOTE — Discharge Instructions (Addendum)
 Please follow-up with your primary doctor for repeat testing for your kidney function/dehydration.  As well as your hemoglobin levels.  You may take the nausea medications we are prescribing as needed for nausea.  You may try an over-the-counter loperamide  to help with your diarrhea.  Return for fevers, chills, passout, chest pain, shortness of breath, severe abdominal pain, your stool becomes bright red or dark black and tarry or you develop any new or worsening symptoms that are concerning to you.

## 2024-06-21 NOTE — Telephone Encounter (Signed)
 FYI Only or Action Required?: Action required by provider: clinical question for provider and declines UC/ED.  Patient was last seen in primary care on 05/06/2024 by Dakota Lynwood HERO, NP.  Called Nurse Triage reporting Abdominal Pain, Diarrhea, and Medication Problem.  Symptoms began several weeks ago.  Interventions attempted: Nothing.  Symptoms are: unchanged.  Triage Disposition: See Physician Within 24 Hours  Patient/caregiver understands and will follow disposition?: No, wishes to speak with PCP   Reason for Disposition  [1] MODERATE pain (e.g., interferes with normal activities) AND [2] comes and goes (cramps) AND [3] present > 24 hours  (Exception: Pain with Vomiting or Diarrhea - see that Guideline.)  Answer Assessment - Initial Assessment Questions No available appts today. Advised UC/ED today and 911 if symptoms worsen: severe abd pain,  severe diff breathing, chest pain > 5 min, faint. Patient verbalized understanding.   Patient declines disposition and requesting meds for discomfort and diarrhea.  1. LOCATION: Where does it hurt?      Epigastric pain, diarrhea 2. RADIATION: Does the pain shoot anywhere else? (e.g., chest, back)     no 3. ONSET: When did the pain begin? (e.g., minutes, hours or days ago)      2 weeks ago      5. PATTERN Does the pain come and go, or is it constant?     constant 6. SEVERITY: How bad is the pain?  (e.g., Scale 1-10; mild, moderate, or severe)     2/10 8. AGGRAVATING FACTORS: Does anything seem to cause this pain? (e.g., foods, stress, alcohol )     Ozempic  started med 05/06/25, symptoms started 2 weeks ago 9. CARDIAC SYMPTOMS: Do you have any of the following symptoms: chest pain, difficulty breathing, sweating, nausea?     Denies diff breathing, chest pain, sweating, fever chills, n/v 10. OTHER SYMPTOMS: Do you have any other symptoms? (e.g., back pain, diarrhea, fever, urination pain, vomiting) Diarrhea: 3x watery/loose  stools, brown, within 24 hours, last bm today. Denies blood, mucous, black tarry stools, distention or hard to touch abd. Able to eat and drink without diff. Urinating as normal.  Protocols used: Abdominal Pain - Upper-A-AH

## 2024-06-21 NOTE — Telephone Encounter (Signed)
 Called and spoke with patient.   Pt started 0.5 ozempic  on December 4th. Symptoms started a week ago. Pt does not think symptoms are due to ozempic .  Pt wanted to have a script sent in for over the weekend.  Advised UC twice . Pt did consider going after declining.  Will call pt on Monday if office visit is needed.

## 2024-06-22 ENCOUNTER — Other Ambulatory Visit: Payer: Self-pay | Admitting: Nurse Practitioner

## 2024-06-22 LAB — GASTROINTESTINAL PANEL BY PCR, STOOL (REPLACES STOOL CULTURE)

## 2024-06-24 ENCOUNTER — Telehealth: Payer: Self-pay

## 2024-06-24 NOTE — Telephone Encounter (Signed)
 He will need to be seen in order to have medications sent in if appropriate

## 2024-06-24 NOTE — Telephone Encounter (Signed)
 Copied from CRM [id number]. Topic: Clinical - Medical Advice >> Jun 24, 2024 11:19 AM Herma G wrote: Reason for CRM: Pt states that he went to the ER on Friday and was recommended to call and let Dr. Wendee know to look over info so it can be discussed. Call pt back at [phone].

## 2024-06-24 NOTE — Telephone Encounter (Signed)
 Can we schedule a hospital follow up please

## 2024-06-24 NOTE — Telephone Encounter (Signed)
 Pt was seen in ED and given medication for symptoms. Will call back if appointment is needed.

## 2024-06-24 NOTE — Telephone Encounter (Signed)
 Right now patient is scheduled for his 3 month f/u 08/01/24; does he also need a hospital follow up scheduled before that?

## 2024-06-28 ENCOUNTER — Ambulatory Visit: Admitting: Nurse Practitioner

## 2024-06-28 VITALS — BP 118/54 | HR 91 | Temp 98.2°F | Ht 72.0 in | Wt 263.8 lb

## 2024-06-28 DIAGNOSIS — K921 Melena: Secondary | ICD-10-CM

## 2024-06-28 DIAGNOSIS — E1165 Type 2 diabetes mellitus with hyperglycemia: Secondary | ICD-10-CM

## 2024-06-28 DIAGNOSIS — Z09 Encounter for follow-up examination after completed treatment for conditions other than malignant neoplasm: Secondary | ICD-10-CM

## 2024-06-28 DIAGNOSIS — N289 Disorder of kidney and ureter, unspecified: Secondary | ICD-10-CM | POA: Diagnosis not present

## 2024-06-28 DIAGNOSIS — Z7985 Long-term (current) use of injectable non-insulin antidiabetic drugs: Secondary | ICD-10-CM

## 2024-06-28 LAB — BASIC METABOLIC PANEL WITH GFR
BUN: 47 mg/dL — ABNORMAL HIGH (ref 6–23)
CO2: 28 meq/L (ref 19–32)
Calcium: 10 mg/dL (ref 8.4–10.5)
Chloride: 103 meq/L (ref 96–112)
Creatinine, Ser: 1.79 mg/dL — ABNORMAL HIGH (ref 0.40–1.50)
GFR: 37.32 mL/min — ABNORMAL LOW
Glucose, Bld: 98 mg/dL (ref 70–99)
Potassium: 4.1 meq/L (ref 3.5–5.1)
Sodium: 139 meq/L (ref 135–145)

## 2024-06-28 LAB — CBC
HCT: 41.1 % (ref 39.0–52.0)
Hemoglobin: 14.1 g/dL (ref 13.0–17.0)
MCHC: 34.3 g/dL (ref 30.0–36.0)
MCV: 90.3 fl (ref 78.0–100.0)
Platelets: 267 K/uL (ref 150.0–400.0)
RBC: 4.55 Mil/uL (ref 4.22–5.81)
RDW: 15.2 % (ref 11.5–15.5)
WBC: 9.8 K/uL (ref 4.0–10.5)

## 2024-06-28 MED ORDER — OZEMPIC (0.25 OR 0.5 MG/DOSE) 2 MG/3ML ~~LOC~~ SOPN: 0.5000 mg | PEN_INJECTOR | SUBCUTANEOUS | 1 refills | Status: AC

## 2024-06-28 NOTE — Patient Instructions (Signed)
Nice to see you today I will be in touch with the labs once I have them Follow up with me as scheduled, sooner if you need me

## 2024-06-28 NOTE — Progress Notes (Signed)
 "  Established Patient Office Visit  Subjective   Patient ID: Dakota Ellison, male    DOB: 09-13-1950  Age: 74 y.o. MRN: [id number]  Chief Complaint  Patient presents with   Hospitalization Follow-up    Pt complains of doing okay.    Medication Refill    Ozempic      HPI  Patient went to the emergency department on 06/21/2024 with abdominal pain.  He had it for the past week and 1/2 to 2 weeks with loose oily stools.  Recently started on Ozempic  on 05/06/2024.  3 days prior to ED visit he had more generalized weakness and malaise he also had some nausea and vomiting prior to coming to the emergency department.  Patient is fecal occult was positive.  CT abdomen pelvis showed no evidence of acute abnormality he had hepatic steatosis, 8.6 cm left renal cyst, prostate megaly, sigmoid diverticulosis.  Here for follow-up  Discussed the use of AI scribe software for clinical note transcription with the patient, who gave verbal consent to proceed.  History of Present Illness Dakota Ellison is a 74 year old male with diabetes who presents with abdominal pain and diarrhea.  He has experienced abdominal pain and diarrhea since June 21, 2024. A CT scan at the hospital revealed a fatty liver and an enlarged prostate. Since Monday, he has had no further episodes of diarrhea, abdominal pain, nausea, or vomiting.  He is currently on Ozempic , with a dose of 0.25 mg, and today marks his last dose at this level. He has been on Ozempic  for approximately two months, having taken eight shots. He believes the symptoms may have been related to something he ate rather than the medication. A stool study conducted at the hospital showed no bacterial infection, but there was a presence of blood in the stool.  He has been monitoring his blood sugar levels at home, with readings ranging from 86 to 152 mg/dL, which he reports as well-controlled. He has lost 21 pounds since May 06, 2024, primarily through dietary  changes, and is concerned about maintaining a healthy balance in his diet to manage his diabetes.  His current medications include Ozempic , Eliquis , allopurinol , alprazolam , hydrocodone , a blood pressure medication, and ezetimibe . He receives a 90-day supply for most medications, except for alprazolam  and hydrocodone , which are on a 30-day supply.  He has a history of elevated white blood cell count and altered kidney function as noted during his hospital visit. No further episodes of diarrhea, abdominal pain, nausea, or vomiting since Monday.     Review of Systems  Constitutional:  Negative for chills and fever.  Respiratory:  Negative for shortness of breath.   Cardiovascular:  Negative for chest pain.  Gastrointestinal:  Negative for abdominal pain and diarrhea.      Objective:     BP (!) 118/54   Pulse 91   Temp 98.2 F (36.8 C) (Oral)   Ht 6' (1.829 m)   Wt 263 lb 12.8 oz (119.7 kg)   SpO2 97%   BMI 35.78 kg/m  BP Readings from Last 3 Encounters:  06/28/24 (!) 118/54  06/22/24 105/65  06/14/24 115/68   Wt Readings from Last 3 Encounters:  06/28/24 263 lb 12.8 oz (119.7 kg)  06/21/24 260 lb (117.9 kg)  06/14/24 265 lb 12.8 oz (120.6 kg)   SpO2 Readings from Last 3 Encounters:  06/28/24 97%  06/22/24 99%  06/14/24 96%      Physical Exam Vitals and nursing note reviewed.  Constitutional:      Appearance: Normal appearance.  Cardiovascular:     Rate and Rhythm: Normal rate and regular rhythm.     Heart sounds: Normal heart sounds.  Pulmonary:     Effort: Pulmonary effort is normal.     Breath sounds: Normal breath sounds.  Abdominal:     General: Bowel sounds are normal. There is no distension.     Palpations: There is no mass.     Tenderness: There is no abdominal tenderness.     Hernia: No hernia is present.  Neurological:     Mental Status: He is alert.      No results found for any visits on 06/28/24.    The 10-year ASCVD risk score (Arnett  DK, et al., 2019) is: 34.6%    Assessment & Plan:   Problem List Items Addressed This Visit   None Visit Diagnoses       Blood in stool    -  Primary   Relevant Orders   CBC   Fecal occult blood, imunochemical     Decreased renal function       Relevant Orders   Basic metabolic panel with GFR     Uncontrolled type 2 diabetes mellitus with hyperglycemia (HCC)       Relevant Medications   Semaglutide ,0.25 or 0.5MG /DOS, (OZEMPIC , 0.25 OR 0.5 MG/DOSE,) 2 MG/3ML SOPN   Other Relevant Orders   CBC   Basic metabolic panel with GFR     Hospital discharge follow-up       Relevant Orders   CBC   Basic metabolic panel with GFR      Assessment and Plan Assessment & Plan Type 2 diabetes mellitus Well-controlled with blood glucose 86-152 mg/dL. Lost 21 pounds. No hypoglycemia. Discussed dietary modifications and Ozempic  for appetite control. - Increased Ozempic  to 0.5 mg. - Provided dietary guidance. - Scheduled A1c check for February 23rd. - Provided stool kit for home use.   Decreased renal function Recent kidney function changes likely due to diarrhea. Plan to recheck. - Rechecked kidney function tests. - reviewed the ED visit along with labs and imaging  General health maintenance Discussed balanced diet and exercise for health and weight management. Emphasized sustainable lifestyle changes to prevent diabetes complications. - Discussed sustainable lifestyle changes.  Return if symptoms worsen or fail to improve, for As scheduled .    Adina Crandall, NP  "

## 2024-07-01 ENCOUNTER — Ambulatory Visit: Payer: Self-pay | Admitting: Nurse Practitioner

## 2024-07-01 ENCOUNTER — Encounter: Admitting: Dietician

## 2024-07-11 ENCOUNTER — Telehealth: Payer: Self-pay

## 2024-07-11 MED ORDER — ALPRAZOLAM 0.5 MG PO TABS: 0.5000 mg | ORAL_TABLET | Freq: Three times a day (TID) | ORAL | 0 refills | Status: AC | PRN

## 2024-07-11 NOTE — Telephone Encounter (Signed)
 LAST APPOINTMENT DATE: 06/28/24 NEXT APPOINTMENT DATE: 07/29/2024  Alprazolam  0.5 mg  LAST REFILL: 05/28/24  QTY: #90, 0RF

## 2024-07-11 NOTE — Telephone Encounter (Signed)
 Copied from CRM #[id number]. Topic: Clinical - Medication Refill >> Jul 11, 2024  3:20 PM Antwanette L wrote: Medication: ALPRAZolam  (XANAX ) 0.5 MG tablet  Has the patient contacted their pharmacy? Yes. Pharmacy has not received an order. The pharmacy said they would send up a refill request and advice pt to contact the provider.   This is the patient's preferred pharmacy:  [facility] - [place], [place] - [address] [place] [address] Phone: [phone] Fax: [phone]   Is this the correct pharmacy for this prescription? Yes   Has the prescription been filled recently? Yes. Last refill was 05/28/24  Is the patient out of the medication? Yes.Pt has one pill left.  Has the patient been seen for an appointment in the last year OR does the patient have an upcoming appointment? Yes. Last office visit(hospital f/u) with Lynwood Crandall was on 06/28/24. Next appt is 07/29/24  Can we respond through MyChart? No. Patient can be reached at  [phone].  Agent: Please be advised that Rx refills may take up to 3 business days. We ask that you follow-up with your pharmacy. >> Jul 11, 2024  3:28 PM Antwanette L wrote: For future refills, the patient would like the provider to send the ALPRAZolam  (Xanax ) 0.5?mg tablets and the HYDROcodone -acetaminophen  (Norco/Vicodin) 5-325?mg tablets at the same time.

## 2024-07-11 NOTE — Telephone Encounter (Signed)
 Script for hydrocodone  sent in on 06/24/2024 it should be on file at the pharmacy

## 2024-07-11 NOTE — Telephone Encounter (Signed)
 Refill request for Alprazolam  and Hydrocodone    LOV - 06/28/24 Next OV - 07/29/24 Last refill - 05/28/24 #90/0 Alprazolam                     06/24/24 #120/0 Hydrocodone 

## 2024-07-15 ENCOUNTER — Encounter: Admitting: Dietician

## 2024-07-19 ENCOUNTER — Encounter: Admitting: Adult Health

## 2024-07-29 ENCOUNTER — Ambulatory Visit: Admitting: Nurse Practitioner

## 2024-10-07 ENCOUNTER — Ambulatory Visit: Admitting: Internal Medicine
